# Patient Record
Sex: Female | Born: 1960 | Race: White | Hispanic: No | Marital: Married | Smoking: Current every day smoker
Health system: Southern US, Community
[De-identification: ages and names within clinical notes are randomized; demographics above are authoritative.]

## PROBLEM LIST (undated history)

## (undated) DIAGNOSIS — I639 Cerebral infarction, unspecified: Secondary | ICD-10-CM

## (undated) DIAGNOSIS — M1990 Unspecified osteoarthritis, unspecified site: Secondary | ICD-10-CM

## (undated) DIAGNOSIS — M549 Dorsalgia, unspecified: Secondary | ICD-10-CM

## (undated) DIAGNOSIS — E785 Hyperlipidemia, unspecified: Secondary | ICD-10-CM

## (undated) DIAGNOSIS — F1010 Alcohol abuse, uncomplicated: Secondary | ICD-10-CM

## (undated) DIAGNOSIS — F329 Major depressive disorder, single episode, unspecified: Secondary | ICD-10-CM

## (undated) DIAGNOSIS — F419 Anxiety disorder, unspecified: Secondary | ICD-10-CM

## (undated) DIAGNOSIS — I10 Essential (primary) hypertension: Secondary | ICD-10-CM

## (undated) DIAGNOSIS — F32A Depression, unspecified: Secondary | ICD-10-CM

## (undated) DIAGNOSIS — K219 Gastro-esophageal reflux disease without esophagitis: Secondary | ICD-10-CM

## (undated) DIAGNOSIS — Z720 Tobacco use: Secondary | ICD-10-CM

## (undated) DIAGNOSIS — G8929 Other chronic pain: Secondary | ICD-10-CM

## (undated) HISTORY — DX: Major depressive disorder, single episode, unspecified: F32.9

## (undated) HISTORY — PX: APPENDECTOMY: SHX54

## (undated) HISTORY — DX: Anxiety disorder, unspecified: F41.9

## (undated) HISTORY — DX: Gastro-esophageal reflux disease without esophagitis: K21.9

## (undated) HISTORY — DX: Essential (primary) hypertension: I10

## (undated) HISTORY — DX: Unspecified osteoarthritis, unspecified site: M19.90

## (undated) HISTORY — DX: Depression, unspecified: F32.A

---

## 2016-04-05 ENCOUNTER — Ambulatory Visit: Payer: Self-pay

## 2016-04-07 ENCOUNTER — Ambulatory Visit: Payer: Self-pay | Attending: Internal Medicine

## 2016-05-06 ENCOUNTER — Ambulatory Visit: Payer: Self-pay

## 2016-10-14 ENCOUNTER — Encounter (HOSPITAL_COMMUNITY): Payer: Self-pay

## 2016-10-14 ENCOUNTER — Emergency Department (HOSPITAL_COMMUNITY)
Admission: EM | Admit: 2016-10-14 | Discharge: 2016-10-14 | Disposition: A | Discharge status: Home / Self Care | Payer: Self-pay | Attending: Emergency Medicine | Admitting: Emergency Medicine

## 2016-10-14 DIAGNOSIS — M545 Low back pain: Secondary | ICD-10-CM | POA: Insufficient documentation

## 2016-10-14 DIAGNOSIS — Z720 Tobacco use: Secondary | ICD-10-CM

## 2016-10-14 DIAGNOSIS — F17210 Nicotine dependence, cigarettes, uncomplicated: Secondary | ICD-10-CM | POA: Insufficient documentation

## 2016-10-14 DIAGNOSIS — R030 Elevated blood-pressure reading, without diagnosis of hypertension: Secondary | ICD-10-CM | POA: Insufficient documentation

## 2016-10-14 HISTORY — DX: Other chronic pain: G89.29

## 2016-10-14 HISTORY — DX: Dorsalgia, unspecified: M54.9

## 2016-10-14 MED ORDER — OXYCODONE-ACETAMINOPHEN 5-325 MG PO TABS
ORAL_TABLET | ORAL | Status: AC
  Filled 2016-10-14: qty 1

## 2016-10-14 MED ORDER — METHOCARBAMOL 500 MG PO TABS: ORAL_TABLET | ORAL | 0 refills | Status: DC

## 2016-10-14 MED ORDER — PREDNISONE 50 MG PO TABS: ORAL_TABLET | ORAL | 0 refills | Status: DC

## 2016-10-14 MED ORDER — LIDOCAINE 5 % EX PTCH
1.0000 | MEDICATED_PATCH | Freq: Once | CUTANEOUS | Status: DC
  Administered 2016-10-14: 1 via TRANSDERMAL
  Filled 2016-10-14: qty 1

## 2016-10-14 MED ORDER — METHOCARBAMOL 500 MG PO TABS
1000.0000 mg | ORAL_TABLET | Freq: Once | ORAL | Status: AC
Start: 2016-10-14 — End: 2016-10-14
  Administered 2016-10-14: 1000 mg via ORAL
  Filled 2016-10-14: qty 2

## 2016-10-14 MED ORDER — OXYCODONE-ACETAMINOPHEN 5-325 MG PO TABS
1.0000 | ORAL_TABLET | ORAL | Status: DC | PRN
  Administered 2016-10-14: 1 via ORAL
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
## 2016-10-14 NOTE — Discharge Instructions (Signed)
These follow at your primary care appointment which was made for you on February 23. If you have recurrent chest pain or if your hand becomes unusable please return to the emergency department for immediate evaluation.  For pain control you may take up to 800mg  of Motrin (also known as ibuprofen). That is usually 4 over the counter pills,  3 times a day. Take with food to minimize stomach irritation   You can also take  tylenol (acetaminophen) 975mg  (this is 3 over the counter pills) four times a day. Do not drink alcohol or combine with other medications that have acetaminophen as an ingredient (Read the labels!).    For breakthrough pain you may take Robaxin. Do not drink alcohol, drive or operate heavy machinery when taking Robaxin.

## 2016-10-14 NOTE — Discharge Planning (Signed)
Charnese Federici J. Clydene Laming, RN, BSN, [facility] set up appointment with Cammie Sickle, NP on 2/23 @0900 .  Spoke with pt at bedside and advised to please arrive 15 min early and take a picture ID and your current medications.  Pt verbalizes understanding of keeping appointment.  EDCM gave pt  Sylvie Farrier, [employer] Specialist contact information regarding [facility] orange card process.   No further CM needs identified at this time.

## 2016-10-14 NOTE — ED Notes (Signed)
ED Provider at bedside. 

## 2016-10-14 NOTE — ED Notes (Signed)
Declined W/C at D/C and was escorted to lobby by RN. 

## 2016-10-16 ENCOUNTER — Encounter (HOSPITAL_COMMUNITY): Payer: Self-pay

## 2016-10-16 ENCOUNTER — Emergency Department (HOSPITAL_COMMUNITY): Payer: Self-pay

## 2016-10-16 ENCOUNTER — Emergency Department (HOSPITAL_COMMUNITY)
Admission: EM | Admit: 2016-10-16 | Discharge: 2016-10-16 | Disposition: A | Discharge status: Home / Self Care | Payer: Self-pay | Attending: Emergency Medicine | Admitting: Emergency Medicine

## 2016-10-16 DIAGNOSIS — F17210 Nicotine dependence, cigarettes, uncomplicated: Secondary | ICD-10-CM | POA: Insufficient documentation

## 2016-10-16 DIAGNOSIS — Y939 Activity, unspecified: Secondary | ICD-10-CM | POA: Insufficient documentation

## 2016-10-16 DIAGNOSIS — Z79899 Other long term (current) drug therapy: Secondary | ICD-10-CM | POA: Insufficient documentation

## 2016-10-16 DIAGNOSIS — Y999 Unspecified external cause status: Secondary | ICD-10-CM | POA: Insufficient documentation

## 2016-10-16 DIAGNOSIS — W0110XA Fall on same level from slipping, tripping and stumbling with subsequent striking against unspecified object, initial encounter: Secondary | ICD-10-CM | POA: Insufficient documentation

## 2016-10-16 DIAGNOSIS — Y929 Unspecified place or not applicable: Secondary | ICD-10-CM | POA: Insufficient documentation

## 2016-10-16 DIAGNOSIS — S300XXA Contusion of lower back and pelvis, initial encounter: Secondary | ICD-10-CM | POA: Insufficient documentation

## 2016-10-16 MED ORDER — HYDROCODONE-ACETAMINOPHEN 5-325 MG PO TABS
1.0000 | ORAL_TABLET | Freq: Once | ORAL | Status: AC
  Administered 2016-10-16: 1 via ORAL
  Filled 2016-10-16: qty 1

## 2016-10-16 MED ORDER — CYCLOBENZAPRINE HCL 10 MG PO TABS: 10.0000 mg | ORAL_TABLET | Freq: Two times a day (BID) | ORAL | 0 refills | Status: DC | PRN

## 2016-10-16 MED ORDER — IBUPROFEN 200 MG PO TABS
600.0000 mg | ORAL_TABLET | Freq: Once | ORAL | Status: AC
  Administered 2016-10-16: 600 mg via ORAL
  Filled 2016-10-16: qty 3

## 2016-10-16 MED ORDER — NAPROXEN 500 MG PO TABS: 500.0000 mg | ORAL_TABLET | Freq: Two times a day (BID) | ORAL | 0 refills | Status: DC

## 2016-10-16 NOTE — Discharge Instructions (Signed)
Naprosyn for pain and inflammation. Flexeril for spasms. Follow up with urgent care of family doctor if not improving.

## 2016-10-16 NOTE — ED Provider Notes (Signed)
[facility] DEPT Provider Note   CSN: [id number] Arrival date & time: 10/16/16  1543     History   Chief Complaint Chief Complaint  Patient presents with  . Fall  . Tailbone Pain    HPI Kelsey Wade is a 56 y.o. female.  HPI Kelsey Wade is a 56 y.o. female with history of back pain, presents to emergency department after a fall. Patient states she was in the shower this morning, states she slipped and fell and hit her lower back/tailbone on the side of the bathtub. Denies any other injuries. No head injury. No loss of consciousness. Reports persistent lower back and sacral pain since the fall. Denies pain radiating down her legs. No trouble urinating or loss of bowel control. Denies any fever or chills. She has not tried taking any medications for her pain at home. She states pain is worsened with movement, sitting, walking. Nothing is making it better. No history of tailbone injuries in the past.  Past Medical History:  Diagnosis Date  . Chronic back pain     There are no active problems to display for this patient.   History reviewed. No pertinent surgical history.  OB History    No data available       Home Medications    Prior to Admission medications   Medication Sig Start Date End Date Taking? Authorizing Provider  methocarbamol (ROBAXIN) 500 MG tablet Can take up to 1-2 tabs every 6 hours PRN PAIN 10/14/16   Elmyra Ricks Pisciotta, PA-C  predniSONE (DELTASONE) 50 MG tablet Take 1 tablet daily with breakfast 10/14/16   Monico Blitz, PA-C    Family History History reviewed. No pertinent family history.  Social History Social History  Substance Use Topics  . Smoking status: Current Every Day Smoker    Packs/day: 0.50    Types: Cigarettes  . Smokeless tobacco: Never Used  . Alcohol use Yes     Comment: Occasionally      Allergies   Patient has no known allergies.   Review of Systems Review of Systems  Constitutional: Negative for chills  and fever.  Respiratory: Negative for cough, chest tightness and shortness of breath.   Cardiovascular: Negative for chest pain, palpitations and leg swelling.  Gastrointestinal: Negative for abdominal pain, diarrhea, nausea and vomiting.  Genitourinary: Negative for dysuria, flank pain and pelvic pain.  Musculoskeletal: Positive for arthralgias and back pain. Negative for myalgias, neck pain and neck stiffness.  Skin: Negative for rash.  Neurological: Negative for dizziness, weakness, numbness and headaches.  All other systems reviewed and are negative.    Physical Exam Updated Vital Signs BP 141/86 (BP Location: Right Arm)   Pulse 93   Temp 98.2 F (36.8 C) (Oral)   Resp 18   Ht 5\' 5"  (1.651 m)   Wt 62.6 kg   SpO2 98%   BMI 22.96 kg/m   Physical Exam  Constitutional: She is oriented to person, place, and time. She appears well-developed and well-nourished. No distress.  HENT:  Head: Normocephalic.  Eyes: Conjunctivae are normal.  Neck: Neck supple.  Cardiovascular: Normal rate, regular rhythm and normal heart sounds.   Pulmonary/Chest: Effort normal and breath sounds normal. No respiratory distress. She has no wheezes. She has no rales.  Abdominal: Soft. Bowel sounds are normal. She exhibits no distension. There is no tenderness. There is no rebound.  Musculoskeletal: She exhibits no edema.  Tenderness to palpation over the lower sacrum midline and over bilateral SI joints. Tenderness to the  coccyx. No bruising, swelling erythema. Full range of motion of bilateral hips and knees. No pain with bilateral straight leg raise.  Neurological: She is alert and oriented to person, place, and time.  5/5 and equal lower extremity strength. 2+ and equal patellar reflexes bilaterally. Pt able to dorsiflex bilateral toes and feet with good strength against resistance. Equal sensation bilaterally over thighs and lower legs.   Skin: Skin is warm and dry.  Psychiatric: She has a normal mood  and affect. Her behavior is normal.  Nursing note and vitals reviewed.    ED Treatments / Results  Labs (all labs ordered are listed, but only abnormal results are displayed) Labs Reviewed - No data to display  EKG  EKG Interpretation None       Radiology Dg Sacrum/coccyx  Result Date: 10/16/2016 CLINICAL DATA:  Recent fall with coccygeal pain, initial encounter EXAM: SACRUM AND COCCYX - 2+ VIEW COMPARISON:  None. FINDINGS: Degenerative changes at the lumbosacral junction are noted. No acute fracture or dislocation is seen. No soft tissue abnormality is noted. IMPRESSION: No acute abnormality noted. Electronically Signed   By: Inez Catalina M.D.   On: 10/16/2016 18:59    Procedures Procedures (including critical care time)  Medications Ordered in ED Medications  HYDROcodone-acetaminophen (NORCO/VICODIN) 5-325 MG per tablet 1 tablet (not administered)  ibuprofen (ADVIL,MOTRIN) tablet 600 mg (not administered)     Initial Impression / Assessment and Plan / ED Course  I have reviewed the triage vital signs and the nursing notes.  Pertinent labs & imaging results that were available during my care of the patient were reviewed by me and considered in my medical decision making (see chart for details).    Patient in emergency department after a fall and hitting her lower sacrum and tailbone on bathtub. Patient with tenderness over the bony structures of the sacrum and coccyx. She is neurovascularly intact. We'll get films.   Patient received Norco for pain in emergency department. X-rays negative for fracture. Most likely contusion. Stable for discharge home, will start on muscle relaxants, NSAIDs, advised to get a doughnut pillow for comfort, follow up with family doctor. Return precautions discussed  Vitals:   10/16/16 1618 10/16/16 1949  BP: 141/86 154/99  Pulse: 93 74  Resp: 18 16  Temp: 98.2 F (36.8 C)   TempSrc: Oral   SpO2: 98% 100%  Weight: 62.6 kg   Height: 5'  5" (1.651 m)      Final Clinical Impressions(s) / ED Diagnoses   Final diagnoses:  Contusion of coccyx, initial encounter    New Prescriptions Discharge Medication List as of 10/16/2016  7:27 PM    START taking these medications   Details  cyclobenzaprine (FLEXERIL) 10 MG tablet Take 1 tablet (10 mg total) by mouth 2 (two) times daily as needed for muscle spasms., Starting Sat 10/16/2016, Print    naproxen (NAPROSYN) 500 MG tablet Take 1 tablet (500 mg total) by mouth 2 (two) times daily., Starting Sat 10/16/2016, Print         Jeannett Senior, PA-C 10/16/16 2320    Julianne Rice, MD 10/17/16 [place]

## 2016-10-16 NOTE — ED Notes (Signed)
Patient transported to X-ray 

## 2016-10-16 NOTE — ED Triage Notes (Signed)
Pt c/o tailbone pain after a slip and fall this afternoon.  Pain score 9/10.  Pt has not taken anything for pain.  Hx of chronic back pain.

## 2016-11-12 ENCOUNTER — Encounter: Payer: Self-pay | Admitting: Family Medicine

## 2016-11-12 ENCOUNTER — Ambulatory Visit (INDEPENDENT_AMBULATORY_CARE_PROVIDER_SITE_OTHER): Payer: Self-pay | Admitting: Family Medicine

## 2016-11-12 VITALS — BP 152/92 | HR 89 | Temp 98.2°F | Resp 18 | Ht 64.0 in | Wt 140.0 lb

## 2016-11-12 DIAGNOSIS — G629 Polyneuropathy, unspecified: Secondary | ICD-10-CM

## 2016-11-12 DIAGNOSIS — M5440 Lumbago with sciatica, unspecified side: Secondary | ICD-10-CM

## 2016-11-12 DIAGNOSIS — K219 Gastro-esophageal reflux disease without esophagitis: Secondary | ICD-10-CM

## 2016-11-12 DIAGNOSIS — F17200 Nicotine dependence, unspecified, uncomplicated: Secondary | ICD-10-CM

## 2016-11-12 DIAGNOSIS — M5441 Lumbago with sciatica, right side: Secondary | ICD-10-CM

## 2016-11-12 DIAGNOSIS — I10 Essential (primary) hypertension: Secondary | ICD-10-CM

## 2016-11-12 DIAGNOSIS — G8929 Other chronic pain: Secondary | ICD-10-CM

## 2016-11-12 DIAGNOSIS — Z23 Encounter for immunization: Secondary | ICD-10-CM

## 2016-11-12 LAB — COMPLETE METABOLIC PANEL WITH GFR
ALBUMIN: 4.5 g/dL (ref 3.6–5.1)
ALK PHOS: 82 U/L (ref 33–130)
ALT: 9 U/L (ref 6–29)
AST: 17 U/L (ref 10–35)
BUN: 16 mg/dL (ref 7–25)
CALCIUM: 9.9 mg/dL (ref 8.6–10.4)
CO2: 27 mmol/L (ref 20–31)
Chloride: 104 mmol/L (ref 98–110)
Creat: 0.83 mg/dL (ref 0.50–1.05)
GFR, EST NON AFRICAN AMERICAN: 80 mL/min (ref >=60)
Glucose, Bld: 100 mg/dL — ABNORMAL HIGH (ref 65–99)
POTASSIUM: 5 mmol/L (ref 3.5–5.3)
SODIUM: 138 mmol/L (ref 135–146)
Total Bilirubin: 0.5 mg/dL (ref 0.2–1.2)
Total Protein: 7.8 g/dL (ref 6.1–8.1)

## 2016-11-12 LAB — CBC WITH DIFFERENTIAL/PLATELET
Basophils Absolute: 0 cells/uL (ref 0–200)
Basophils Relative: 0 %
EOS ABS: 160 {cells}/uL (ref 15–500)
Eosinophils Relative: 2 %
HEMATOCRIT: 49.7 % — AB (ref 35.0–45.0)
HEMOGLOBIN: 17.2 g/dL — AB (ref 11.7–15.5)
LYMPHS PCT: 37 %
Lymphs Abs: 2960 cells/uL (ref 850–3900)
MCH: 31.3 pg (ref 27.0–33.0)
MCHC: 34.6 g/dL (ref 32.0–36.0)
MCV: 90.5 fL (ref 80.0–100.0)
MPV: 9.9 fL (ref 7.5–12.5)
Monocytes Absolute: 480 cells/uL (ref 200–950)
Monocytes Relative: 6 %
NEUTROS PCT: 55 %
Neutro Abs: 4400 cells/uL (ref 1500–7800)
Platelets: 229 10*3/uL (ref 140–400)
RBC: 5.49 MIL/uL — ABNORMAL HIGH (ref 3.80–5.10)
RDW: 13.2 % (ref 11.0–15.0)
WBC: 8 10*3/uL (ref 3.8–10.8)

## 2016-11-12 LAB — POCT URINALYSIS DIP (DEVICE)
BILIRUBIN URINE: NEGATIVE
Glucose, UA: NEGATIVE mg/dL
HGB URINE DIPSTICK: NEGATIVE
Ketones, ur: NEGATIVE mg/dL
Leukocytes, UA: NEGATIVE
NITRITE: NEGATIVE
PH: 7 (ref 5.0–8.0)
Protein, ur: NEGATIVE mg/dL
SPECIFIC GRAVITY, URINE: 1.015 (ref 1.005–1.030)
UROBILINOGEN UA: 0.2 mg/dL (ref 0.0–1.0)

## 2016-11-12 LAB — LIPID PANEL
CHOL/HDL RATIO: 3.7 ratio (ref <5.0)
CHOLESTEROL: 215 mg/dL — AB (ref <200)
HDL: 58 mg/dL (ref >50)
LDL Cholesterol: 126 mg/dL — ABNORMAL HIGH (ref <100)
TRIGLYCERIDES: 155 mg/dL — AB (ref <150)
VLDL: 31 mg/dL — ABNORMAL HIGH (ref <30)

## 2016-11-12 MED ORDER — KETOROLAC TROMETHAMINE 60 MG/2ML IM SOLN
60.0000 mg | Freq: Once | INTRAMUSCULAR | Status: AC
  Administered 2016-11-12: 60 mg via INTRAMUSCULAR

## 2016-11-12 MED ORDER — AMLODIPINE BESYLATE 5 MG PO TABS: 5.0000 mg | ORAL_TABLET | Freq: Every day | ORAL | 5 refills | Status: DC

## 2016-11-12 MED ORDER — CLONIDINE HCL 0.2 MG PO TABS
0.2000 mg | ORAL_TABLET | Freq: Once | ORAL | Status: AC
  Administered 2016-11-12: 0.2 mg via ORAL

## 2016-11-12 MED ORDER — GABAPENTIN 300 MG PO CAPS: 300.0000 mg | ORAL_CAPSULE | Freq: Three times a day (TID) | ORAL | 0 refills | Status: DC

## 2016-11-12 MED ORDER — OMEPRAZOLE 20 MG PO CPDR: 20.0000 mg | DELAYED_RELEASE_CAPSULE | Freq: Every day | ORAL | 3 refills | Status: DC
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
## 2016-11-12 NOTE — Patient Instructions (Addendum)
Elevated blood pressure, will start Amlodipine 5 mg daily with breakfast. Follow up in 1 month for hypertension. Recommend a low fat, low sodium diet.  Nerve Pain- Will start a trial of Gabapentin 300 mg three times per day. Do not drink alcohol, drive, or operate machinery while taking medication.   Will start Omeprazole for symptoms of GERD  Will notify by phone with laboratory results.  Back Pain, Adult Back pain is very common in adults.The cause of back pain is rarely dangerous and the pain often gets better over time.The cause of your back pain may not be known. Some common causes of back pain include:  Strain of the muscles or ligaments supporting the spine.  Wear and tear (degeneration) of the spinal disks.  Arthritis.  Direct injury to the back. For many people, back pain may return. Since back pain is rarely dangerous, most people can learn to manage this condition on their own. Follow these instructions at home: Watch your back pain for any changes. The following actions may help to lessen any discomfort you are feeling:  Remain active. It is stressful on your back to sit or stand in one place for long periods of time. Do not sit, drive, or stand in one place for more than 30 minutes at a time. Take short walks on even surfaces as soon as you are able.Try to increase the length of time you walk each day.  Exercise regularly as directed by your health care provider. Exercise helps your back heal faster. It also helps avoid future injury by keeping your muscles strong and flexible.  Do not stay in bed.Resting more than 1-2 days can delay your recovery.  Pay attention to your body when you bend and lift. The most comfortable positions are those that put less stress on your recovering back. Always use proper lifting techniques, including:  Bending your knees.  Keeping the load close to your body.  Avoiding twisting.  Find a comfortable position to sleep. Use a firm  mattress and lie on your side with your knees slightly bent. If you lie on your back, put a pillow under your knees.  Avoid feeling anxious or stressed.Stress increases muscle tension and can worsen back pain.It is important to recognize when you are anxious or stressed and learn ways to manage it, such as with exercise.  Take medicines only as directed by your health care provider. Over-the-counter medicines to reduce pain and inflammation are often the most helpful.Your health care provider may prescribe muscle relaxant drugs.These medicines help dull your pain so you can more quickly return to your normal activities and healthy exercise.  Apply ice to the injured area:  Put ice in a plastic bag.  Place a towel between your skin and the bag.  Leave the ice on for 20 minutes, 2-3 times a day for the first 2-3 days. After that, ice and heat may be alternated to reduce pain and spasms.  Maintain a healthy weight. Excess weight puts extra stress on your back and makes it difficult to maintain good posture. Contact a health care provider if:  You have pain that is not relieved with rest or medicine.  You have increasing pain going down into the legs or buttocks.  You have pain that does not improve in one week.  You have night pain.  You lose weight.  You have a fever or chills. Get help right away if:  You develop new bowel or bladder control problems.  You have  unusual weakness or numbness in your arms or legs.  You develop nausea or vomiting.  You develop abdominal pain.  You feel faint. This information is not intended to replace advice given to you by your health care provider. Make sure you discuss any questions you have with your health care provider. Document Released: 09/06/2005 Document Revised: 01/15/2016 Document Reviewed: 01/08/2014 Elsevier Interactive Patient Education  2017 [place] for Gastroesophageal Reflux Disease, Adult When you  have gastroesophageal reflux disease (GERD), the foods you eat and your eating habits are very important. Choosing the right foods can help ease your discomfort. What guidelines do I need to follow?  Choose fruits, vegetables, whole grains, and low-fat dairy products.  Choose low-fat meat, fish, and poultry.  Limit fats such as oils, salad dressings, butter, nuts, and avocado.  Keep a food diary. This helps you identify foods that cause symptoms.  Avoid foods that cause symptoms. These may be different for everyone.  Eat small meals often instead of 3 large meals a day.  Eat your meals slowly, in a place where you are relaxed.  Limit fried foods.  Cook foods using methods other than frying.  Avoid drinking alcohol.  Avoid drinking large amounts of liquids with your meals.  Avoid bending over or lying down until 2-3 hours after eating. What foods are not recommended? These are some foods and drinks that may make your symptoms worse: Vegetables  Tomatoes. Tomato juice. Tomato and spaghetti sauce. Chili peppers. Onion and garlic. Horseradish. Fruits  Oranges, grapefruit, and lemon (fruit and juice). Meats  High-fat meats, fish, and poultry. This includes hot dogs, ribs, ham, sausage, salami, and bacon. Dairy  Whole milk and chocolate milk. Sour cream. Cream. Butter. Ice cream. Cream cheese. Drinks  Coffee and tea. Bubbly (carbonated) drinks or energy drinks. Condiments  Hot sauce. Barbecue sauce. Sweets/Desserts  Chocolate and cocoa. Donuts. Peppermint and spearmint. Fats and Oils  High-fat foods. This includes [place] fries and potato chips. Other  Vinegar. Strong spices. This includes black pepper, white pepper, red pepper, cayenne, curry powder, cloves, ginger, and chili powder. The items listed above may not be a complete list of foods and drinks to avoid. Contact your dietitian for more information.  This information is not intended to replace advice given to you by  your health care provider. Make sure you discuss any questions you have with your health care provider. Document Released: 03/07/2012 Document Revised: 02/12/2016 Document Reviewed: 07/11/2013 Elsevier Interactive Patient Education  2017 [employer].  Back Exercises Introduction If you have pain in your back, do these exercises 2-3 times each day or as told by your doctor. When the pain goes away, do the exercises once each day, but repeat the steps more times for each exercise (do more repetitions). If you do not have pain in your back, do these exercises once each day or as told by your doctor. Exercises Single Knee to Chest  Do these steps 3-5 times in a row for each leg: 1. Lie on your back on a firm bed or the floor with your legs stretched out. 2. Bring one knee to your chest. 3. Hold your knee to your chest by grabbing your knee or thigh. 4. Pull on your knee until you feel a gentle stretch in your lower back. 5. Keep doing the stretch for 10-30 seconds. 6. Slowly let go of your leg and straighten it. Pelvic Tilt  Do these steps 5-10 times in a row: 1. Kelsey Wade  on your back on a firm bed or the floor with your legs stretched out. 2. Bend your knees so they point up to the ceiling. Your feet should be flat on the floor. 3. Tighten your lower belly (abdomen) muscles to press your lower back against the floor. This will make your tailbone point up to the ceiling instead of pointing down to your feet or the floor. 4. Stay in this position for 5-10 seconds while you gently tighten your muscles and breathe evenly. Cat-Cow  Do these steps until your lower back bends more easily: 1. Get on your hands and knees on a firm surface. Keep your hands under your shoulders, and keep your knees under your hips. You may put padding under your knees. 2. Let your head hang down, and make your tailbone point down to the floor so your lower back is round like the back of a cat. 3. Stay in this position for 5  seconds. 4. Slowly lift your head and make your tailbone point up to the ceiling so your back hangs low (sags) like the back of a cow. 5. Stay in this position for 5 seconds. Press-Ups  Do these steps 5-10 times in a row: 1. Lie on your belly (face-down) on the floor. 2. Place your hands near your head, about shoulder-width apart. 3. While you keep your back relaxed and keep your hips on the floor, slowly straighten your arms to raise the top half of your body and lift your shoulders. Do not use your back muscles. To make yourself more comfortable, you may change where you place your hands. 4. Stay in this position for 5 seconds. 5. Slowly return to lying flat on the floor. Bridges  Do these steps 10 times in a row: 1. Lie on your back on a firm surface. 2. Bend your knees so they point up to the ceiling. Your feet should be flat on the floor. 3. Tighten your butt muscles and lift your butt off of the floor until your waist is almost as high as your knees. If you do not feel the muscles working in your butt and the back of your thighs, slide your feet 1-2 inches farther away from your butt. 4. Stay in this position for 3-5 seconds. 5. Slowly lower your butt to the floor, and let your butt muscles relax. If this exercise is too easy, try doing it with your arms crossed over your chest. Belly Crunches  Do these steps 5-10 times in a row: 1. Lie on your back on a firm bed or the floor with your legs stretched out. 2. Bend your knees so they point up to the ceiling. Your feet should be flat on the floor. 3. Cross your arms over your chest. 4. Tip your chin a little bit toward your chest but do not bend your neck. 5. Tighten your belly muscles and slowly raise your chest just enough to lift your shoulder blades a tiny bit off of the floor. 6. Slowly lower your chest and your head to the floor. Back Lifts  Do these steps 5-10 times in a row: 1. Lie on your belly (face-down) with your arms at  your sides, and rest your forehead on the floor. 2. Tighten the muscles in your legs and your butt. 3. Slowly lift your chest off of the floor while you keep your hips on the floor. Keep the back of your head in line with the curve in your back. Look at the  floor while you do this. 4. Stay in this position for 3-5 seconds. 5. Slowly lower your chest and your face to the floor. Contact a doctor if:  Your back pain gets a lot worse when you do an exercise.  Your back pain does not lessen 2 hours after you exercise. If you have any of these problems, stop doing the exercises. Do not do them again unless your doctor says it is okay. Get help right away if:  You have sudden, very bad back pain. If this happens, stop doing the exercises. Do not do them again unless your doctor says it is okay. This information is not intended to replace advice given to you by your health care provider. Make sure you discuss any questions you have with your health care provider. Document Released: 10/09/2010 Document Revised: 02/12/2016 Document Reviewed: 10/31/2014  2017 Elsevier DASH Eating Plan DASH stands for "Dietary Approaches to Stop Hypertension." The DASH eating plan is a healthy eating plan that has been shown to reduce high blood pressure (hypertension). Additional health benefits may include reducing the risk of type 2 diabetes mellitus, heart disease, and stroke. The DASH eating plan may also help with weight loss. What do I need to know about the DASH eating plan? For the DASH eating plan, you will follow these general guidelines:  Choose foods with less than 150 milligrams of sodium per serving (as listed on the food label).  Use salt-free seasonings or herbs instead of table salt or sea salt.  Check with your health care provider or pharmacist before using salt substitutes.  Eat lower-sodium products. These are often labeled as "low-sodium" or "no salt added."  Eat fresh foods. Avoid eating a  lot of canned foods.  Eat more vegetables, fruits, and low-fat dairy products.  Choose whole grains. Look for the word "whole" as the first word in the ingredient list.  Choose fish and skinless chicken or [place] more often than red meat. Limit fish, poultry, and meat to 6 oz (170 g) each day.  Limit sweets, desserts, sugars, and sugary drinks.  Choose heart-healthy fats.  Eat more home-cooked food and less restaurant, buffet, and fast food.  Limit fried foods.  Do not fry foods. Cook foods using methods such as baking, boiling, grilling, and broiling instead.  When eating at a restaurant, ask that your food be prepared with less salt, or no salt if possible. What foods can I eat? Seek help from a dietitian for individual calorie needs. Grains  Whole grain or whole wheat bread. Brown rice. Whole grain or whole wheat pasta. Quinoa, bulgur, and whole grain cereals. Low-sodium cereals. Corn or whole wheat flour tortillas. Whole grain cornbread. Whole grain crackers. Low-sodium crackers. Vegetables  Fresh or frozen vegetables (raw, steamed, roasted, or grilled). Low-sodium or reduced-sodium tomato and vegetable juices. Low-sodium or reduced-sodium tomato sauce and paste. Low-sodium or reduced-sodium canned vegetables. Fruits  All fresh, canned (in natural juice), or frozen fruits. Meat and Other Protein Products  Ground beef (85% or leaner), grass-fed beef, or beef trimmed of fat. Skinless chicken or [place]. Ground chicken or [place]. Pork trimmed of fat. All fish and seafood. Eggs. Dried beans, peas, or lentils. Unsalted nuts and seeds. Unsalted canned beans. Dairy  Low-fat dairy products, such as skim or 1% milk, 2% or reduced-fat cheeses, low-fat ricotta or cottage cheese, or plain low-fat yogurt. Low-sodium or reduced-sodium cheeses. Fats and Oils  Tub margarines without trans fats. Light or reduced-fat mayonnaise and salad dressings (reduced sodium).  Avocado. Safflower, olive, or  canola oils. Natural peanut or almond butter. Other  Unsalted popcorn and pretzels. The items listed above may not be a complete list of recommended foods or beverages. Contact your dietitian for more options.  What foods are not recommended? Grains  White bread. White pasta. White rice. Refined cornbread. Bagels and croissants. Crackers that contain trans fat. Vegetables  Creamed or fried vegetables. Vegetables in a cheese sauce. Regular canned vegetables. Regular canned tomato sauce and paste. Regular tomato and vegetable juices. Fruits  Canned fruit in light or heavy syrup. Fruit juice. Meat and Other Protein Products  Fatty cuts of meat. Ribs, chicken wings, bacon, sausage, bologna, salami, chitterlings, fatback, hot dogs, bratwurst, and packaged luncheon meats. Salted nuts and seeds. Canned beans with salt. Dairy  Whole or 2% milk, cream, half-and-half, and cream cheese. Whole-fat or sweetened yogurt. Full-fat cheeses or blue cheese. Nondairy creamers and whipped toppings. Processed cheese, cheese spreads, or cheese curds. Condiments  Onion and garlic salt, seasoned salt, table salt, and sea salt. Canned and packaged gravies. Worcestershire sauce. Tartar sauce. Barbecue sauce. Teriyaki sauce. Soy sauce, including reduced sodium. Steak sauce. Fish sauce. Oyster sauce. Cocktail sauce. Horseradish. Ketchup and mustard. Meat flavorings and tenderizers. Bouillon cubes. Hot sauce. Tabasco sauce. Marinades. Taco seasonings. Relishes. Fats and Oils  Butter, stick margarine, lard, shortening, ghee, and bacon fat. Coconut, palm kernel, or palm oils. Regular salad dressings. Other  Pickles and olives. Salted popcorn and pretzels. The items listed above may not be a complete list of foods and beverages to avoid. Contact your dietitian for more information.  Where can I find more information? National Heart, Lung, and Blood Institute: travelstabloid.com This  information is not intended to replace advice given to you by your health care provider. Make sure you discuss any questions you have with your health care provider. Document Released: 08/26/2011 Document Revised: 02/12/2016 Document Reviewed: 07/11/2013 Elsevier Interactive Patient Education  2017 [employer].

## 2016-11-15 ENCOUNTER — Other Ambulatory Visit: Payer: Self-pay | Admitting: Family Medicine

## 2016-11-15 DIAGNOSIS — E785 Hyperlipidemia, unspecified: Secondary | ICD-10-CM | POA: Insufficient documentation

## 2016-11-15 MED ORDER — ASPIRIN EC 81 MG PO TBEC: 81.0000 mg | DELAYED_RELEASE_TABLET | Freq: Every day | ORAL | 11 refills | Status: DC

## 2016-11-15 MED ORDER — ATORVASTATIN CALCIUM 20 MG PO TABS: 20.0000 mg | ORAL_TABLET | Freq: Every day | ORAL | 1 refills | Status: DC

## 2016-11-15 NOTE — Progress Notes (Signed)
Meds ordered this encounter  Medications  . atorvastatin (LIPITOR) 20 MG tablet    Sig: Take 1 tablet (20 mg total) by mouth daily.    Dispense:  90 tablet    Refill:  1  . aspirin EC 81 MG tablet    Sig: Take 1 tablet (81 mg total) by mouth daily.    Dispense:  30 tablet    Refill:  11

## 2016-11-15 NOTE — Progress Notes (Signed)
Called, no answer. Left message for patient to return call. Thanks!  

## 2016-11-16 NOTE — Progress Notes (Signed)
Called, no answer. Left message for patient to return call. Thanks!  

## 2016-11-17 NOTE — Progress Notes (Signed)
Called, no answer. Will mail letter due to unable to contact patient via phone. Thanks!

## 2016-11-29 ENCOUNTER — Ambulatory Visit (INDEPENDENT_AMBULATORY_CARE_PROVIDER_SITE_OTHER): Payer: Self-pay | Admitting: Family Medicine

## 2016-11-29 ENCOUNTER — Encounter: Payer: Self-pay | Admitting: Family Medicine

## 2016-11-29 VITALS — BP 154/80 | HR 81 | Temp 97.7°F | Resp 18 | Ht 64.0 in | Wt 138.8 lb

## 2016-11-29 DIAGNOSIS — I10 Essential (primary) hypertension: Secondary | ICD-10-CM

## 2016-11-29 DIAGNOSIS — G8929 Other chronic pain: Secondary | ICD-10-CM

## 2016-11-29 DIAGNOSIS — E785 Hyperlipidemia, unspecified: Secondary | ICD-10-CM

## 2016-11-29 DIAGNOSIS — F339 Major depressive disorder, recurrent, unspecified: Secondary | ICD-10-CM

## 2016-11-29 DIAGNOSIS — M549 Dorsalgia, unspecified: Secondary | ICD-10-CM

## 2016-11-29 DIAGNOSIS — G629 Polyneuropathy, unspecified: Secondary | ICD-10-CM

## 2016-11-29 DIAGNOSIS — F17200 Nicotine dependence, unspecified, uncomplicated: Secondary | ICD-10-CM

## 2016-11-29 MED ORDER — DULOXETINE HCL 30 MG PO CPEP: 30.0000 mg | ORAL_CAPSULE | Freq: Every day | ORAL | 5 refills | Status: DC

## 2016-11-29 MED ORDER — AMLODIPINE BESYLATE 10 MG PO TABS: 10.0000 mg | ORAL_TABLET | Freq: Every day | ORAL | 5 refills | Status: DC

## 2016-11-29 MED ORDER — KETOROLAC TROMETHAMINE 60 MG/2ML IM SOLN
60.0000 mg | Freq: Once | INTRAMUSCULAR | Status: AC
  Administered 2016-11-29: 60 mg via INTRAMUSCULAR
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
## 2016-11-29 NOTE — Patient Instructions (Addendum)
Cholesterol Cholesterol is a fat. Your body needs a small amount of cholesterol. Cholesterol (plaque) may build up in your blood vessels (arteries). That makes you more likely to have a heart attack or stroke. You cannot feel your cholesterol level. Having a blood test is the only way to find out if your level is high. Keep your test results. Work with your doctor to keep your cholesterol at a good level. What do the results mean?  Total cholesterol is how much cholesterol is in your blood.  LDL is bad cholesterol. This is the type that can build up. Try to have low LDL.  HDL is good cholesterol. It cleans your blood vessels and carries LDL away. Try to have high HDL.  Triglycerides are fat that the body can store or burn for energy. What are good levels of cholesterol?  Total cholesterol below 200.  LDL below 100 is good for people who have health risks. LDL below 70 is good for people who have very high risks.  HDL above 40 is good. It is best to have HDL of 60 or higher.  Triglycerides below 150. How can I lower my cholesterol? Diet  Follow your diet program as told by your doctor.  Choose fish, white meat chicken, or [place] that is roasted or baked. Try not to eat red meat, fried foods, sausage, or lunch meats.  Eat lots of fresh fruits and vegetables.  Choose whole grains, beans, pasta, potatoes, and cereals.  Choose olive oil, corn oil, or canola oil. Only use small amounts.  Try not to eat butter, mayonnaise, shortening, or palm kernel oils.  Try not to eat foods with trans fats.  Choose low-fat or nonfat dairy foods.  Drink skim or nonfat milk.  Eat low-fat or nonfat yogurt and cheeses.  Try not to drink whole milk or cream.  Try not to eat ice cream, egg yolks, or full-fat cheeses.  Healthy desserts include angel food cake, ginger snaps, animal crackers, hard candy, popsicles, and low-fat or nonfat frozen yogurt. Try not to eat pastries, cakes, pies, and  cookies. Exercise  Follow your exercise program as told by your doctor.  Be more active. Try gardening, walking, and taking the stairs.  Ask your doctor about ways that you can be more active. Medicine  Take over-the-counter and prescription medicines only as told by your doctor. This information is not intended to replace advice given to you by your health care provider. Make sure you discuss any questions you have with your health care provider. Document Released: 12/03/2008 Document Revised: 04/07/2016 Document Reviewed: 03/18/2016 Elsevier Interactive Patient Education  2017 Elsevier Inc.  Back Pain, Adult Back pain is very common. The pain often gets better over time. The cause of back pain is usually not dangerous. Most people can learn to manage their back pain on their own. Follow these instructions at home: Watch your back pain for any changes. The following actions may help to lessen any pain you are feeling:  Stay active. Start with short walks on flat ground if you can. Try to walk farther each day.  Exercise regularly as told by your doctor. Exercise helps your back heal faster. It also helps avoid future injury by keeping your muscles strong and flexible.  Do not sit, drive, or stand in one place for more than 30 minutes.  Do not stay in bed. Resting more than 1-2 days can slow down your recovery.  Be careful when you bend or lift an object.  Use good form when lifting:  Bend at your knees.  Keep the object close to your body.  Do not twist.  Sleep on a firm mattress. Lie on your side, and bend your knees. If you lie on your back, put a pillow under your knees.  Take medicines only as told by your doctor.  Put ice on the injured area.  Put ice in a plastic bag.  Place a towel between your skin and the bag.  Leave the ice on for 20 minutes, 2-3 times a day for the first 2-3 days. After that, you can switch between ice and heat packs.  Avoid feeling anxious  or stressed. Find good ways to deal with stress, such as exercise.  Maintain a healthy weight. Extra weight puts stress on your back. Contact a doctor if:  You have pain that does not go away with rest or medicine.  You have worsening pain that goes down into your legs or buttocks.  You have pain that does not get better in one week.  You have pain at night.  You lose weight.  You have a fever or chills. Get help right away if:  You cannot control when you poop (bowel movement) or pee (urinate).  Your arms or legs feel weak.  Your arms or legs lose feeling (numbness).  You feel sick to your stomach (nauseous) or throw up (vomit).  You have belly (abdominal) pain.  You feel like you may pass out (faint). This information is not intended to replace advice given to you by your health care provider. Make sure you discuss any questions you have with your health care provider. Document Released: 02/23/2008 Document Revised: 02/12/2016 Document Reviewed: 01/08/2014 Elsevier Interactive Patient Education  2017 [place] DASH stands for "Dietary Approaches to Stop Hypertension." The DASH eating plan is a healthy eating plan that has been shown to reduce high blood pressure (hypertension). It may also reduce your risk for type 2 diabetes, heart disease, and stroke. The DASH eating plan may also help with weight loss. What are tips for following this plan? General guidelines   Avoid eating more than 2,300 mg (milligrams) of salt (sodium) a day. If you have hypertension, you may need to reduce your sodium intake to 1,500 mg a day.  Limit alcohol intake to no more than 1 drink a day for nonpregnant women and 2 drinks a day for men. One drink equals 12 oz of beer, 5 oz of wine, or 1 oz of hard liquor.  Work with your health care provider to maintain a healthy body weight or to lose weight. Ask what an ideal weight is for you.  Get at least 30 minutes of exercise  that causes your heart to beat faster (aerobic exercise) most days of the week. Activities may include walking, swimming, or biking.  Work with your health care provider or diet and nutrition specialist (dietitian) to adjust your eating plan to your individual calorie needs. Reading food labels   Check food labels for the amount of sodium per serving. Choose foods with less than 5 percent of the Daily Value of sodium. Generally, foods with less than 300 mg of sodium per serving fit into this eating plan.  To find whole grains, look for the word "whole" as the first word in the ingredient list. Shopping   Buy products labeled as "low-sodium" or "no salt added."  Buy fresh foods. Avoid canned foods and premade or frozen meals. Cooking  Avoid adding salt when cooking. Use salt-free seasonings or herbs instead of table salt or sea salt. Check with your health care provider or pharmacist before using salt substitutes.  Do not fry foods. Cook foods using healthy methods such as baking, boiling, grilling, and broiling instead.  Cook with heart-healthy oils, such as olive, canola, soybean, or sunflower oil. Meal planning    Eat a balanced diet that includes:  5 or more servings of fruits and vegetables each day. At each meal, try to fill half of your plate with fruits and vegetables.  Up to 6-8 servings of whole grains each day.  Less than 6 oz of lean meat, poultry, or fish each day. A 3-oz serving of meat is about the same size as a deck of cards. One egg equals 1 oz.  2 servings of low-fat dairy each day.  A serving of nuts, seeds, or beans 5 times each week.  Heart-healthy fats. Healthy fats called Omega-3 fatty acids are found in foods such as flaxseeds and coldwater fish, like sardines, salmon, and mackerel.  Limit how much you eat of the following:  Canned or prepackaged foods.  Food that is high in trans fat, such as fried foods.  Food that is high in saturated fat, such  as fatty meat.  Sweets, desserts, sugary drinks, and other foods with added sugar.  Full-fat dairy products.  Do not salt foods before eating.  Try to eat at least 2 vegetarian meals each week.  Eat more home-cooked food and less restaurant, buffet, and fast food.  When eating at a restaurant, ask that your food be prepared with less salt or no salt, if possible. What foods are recommended? The items listed may not be a complete list. Talk with your dietitian about what dietary choices are best for you. Grains  Whole-grain or whole-wheat bread. Whole-grain or whole-wheat pasta. Brown rice. [place]. Bulgur. Whole-grain and low-sodium cereals. Pita bread. Low-fat, low-sodium crackers. Whole-wheat flour tortillas. Vegetables  Fresh or frozen vegetables (raw, steamed, roasted, or grilled). Low-sodium or reduced-sodium tomato and vegetable juice. Low-sodium or reduced-sodium tomato sauce and tomato paste. Low-sodium or reduced-sodium canned vegetables. Fruits  All fresh, dried, or frozen fruit. Canned fruit in natural juice (without added sugar). Meat and other protein foods  Skinless chicken or [place]. Ground chicken or [place]. Pork with fat trimmed off. Fish and seafood. Egg whites. Dried beans, peas, or lentils. Unsalted nuts, nut butters, and seeds. Unsalted canned beans. Lean cuts of beef with fat trimmed off. Low-sodium, lean deli meat. Dairy  Low-fat (1%) or fat-free (skim) milk. Fat-free, low-fat, or reduced-fat cheeses. Nonfat, low-sodium ricotta or cottage cheese. Low-fat or nonfat yogurt. Low-fat, low-sodium cheese. Fats and oils  Soft margarine without trans fats. Vegetable oil. Low-fat, reduced-fat, or light mayonnaise and salad dressings (reduced-sodium). Canola, safflower, olive, soybean, and sunflower oils. Avocado. Seasoning and other foods  Herbs. Spices. Seasoning mixes without salt. Unsalted popcorn and pretzels. Fat-free sweets. What foods are not  recommended? The items listed may not be a complete list. Talk with your dietitian about what dietary choices are best for you. Grains  Baked goods made with fat, such as croissants, muffins, or some breads. Dry pasta or rice meal packs. Vegetables  Creamed or fried vegetables. Vegetables in a cheese sauce. Regular canned vegetables (not low-sodium or reduced-sodium). Regular canned tomato sauce and paste (not low-sodium or reduced-sodium). Regular tomato and vegetable juice (not low-sodium or reduced-sodium). Angie Fava. Olives. Fruits  Canned fruit in a light  or heavy syrup. Fried fruit. Fruit in cream or butter sauce. Meat and other protein foods  Fatty cuts of meat. Ribs. Fried meat. Berniece Salines. Sausage. Bologna and other processed lunch meats. Salami. Fatback. Hotdogs. Bratwurst. Salted nuts and seeds. Canned beans with added salt. Canned or smoked fish. Whole eggs or egg yolks. Chicken or [place] with skin. Dairy  Whole or 2% milk, cream, and half-and-half. Whole or full-fat cream cheese. Whole-fat or sweetened yogurt. Full-fat cheese. Nondairy creamers. Whipped toppings. Processed cheese and cheese spreads. Fats and oils  Butter. Stick margarine. Lard. Shortening. Ghee. Bacon fat. Tropical oils, such as coconut, palm kernel, or palm oil. Seasoning and other foods  Salted popcorn and pretzels. Onion salt, garlic salt, seasoned salt, table salt, and sea salt. Worcestershire sauce. Tartar sauce. Barbecue sauce. Teriyaki sauce. Soy sauce, including reduced-sodium. Steak sauce. Canned and packaged gravies. Fish sauce. Oyster sauce. Cocktail sauce. Horseradish that you find on the shelf. Ketchup. Mustard. Meat flavorings and tenderizers. Bouillon cubes. Hot sauce and Tabasco sauce. Premade or packaged marinades. Premade or packaged taco seasonings. Relishes. Regular salad dressings. Where to find more information:  National Heart, Lung, and [place]: https://wilson-eaton.com/  American Heart Association:  www.heart.org Summary  The DASH eating plan is a healthy eating plan that has been shown to reduce high blood pressure (hypertension). It may also reduce your risk for type 2 diabetes, heart disease, and stroke.  With the DASH eating plan, you should limit salt (sodium) intake to 2,300 mg a day. If you have hypertension, you may need to reduce your sodium intake to 1,500 mg a day.  When on the DASH eating plan, aim to eat more fresh fruits and vegetables, whole grains, lean proteins, low-fat dairy, and heart-healthy fats.  Work with your health care provider or diet and nutrition specialist (dietitian) to adjust your eating plan to your individual calorie needs. This information is not intended to replace advice given to you by your health care provider. Make sure you discuss any questions you have with your health care provider. Document Released: 08/26/2011 Document Revised: 08/30/2016 Document Reviewed: 08/30/2016 Elsevier Interactive Patient Education  2017 [employer].

## 2016-11-30 LAB — POCT URINALYSIS DIP (DEVICE)
BILIRUBIN URINE: NEGATIVE
Glucose, UA: NEGATIVE mg/dL
HGB URINE DIPSTICK: NEGATIVE
Ketones, ur: NEGATIVE mg/dL
Leukocytes, UA: NEGATIVE
Nitrite: NEGATIVE
PH: 6 (ref 5.0–8.0)
PROTEIN: NEGATIVE mg/dL
SPECIFIC GRAVITY, URINE: 1.025 (ref 1.005–1.030)
Urobilinogen, UA: 0.2 mg/dL (ref 0.0–1.0)

## 2016-12-10 ENCOUNTER — Ambulatory Visit: Payer: Self-pay | Admitting: Family Medicine

## 2016-12-29 ENCOUNTER — Ambulatory Visit: Payer: Self-pay | Admitting: Family Medicine

## 2016-12-31 ENCOUNTER — Other Ambulatory Visit: Payer: Self-pay | Admitting: Family Medicine

## 2016-12-31 DIAGNOSIS — G629 Polyneuropathy, unspecified: Secondary | ICD-10-CM

## 2017-01-18 ENCOUNTER — Encounter: Payer: Self-pay | Admitting: Family Medicine

## 2017-01-18 ENCOUNTER — Ambulatory Visit (INDEPENDENT_AMBULATORY_CARE_PROVIDER_SITE_OTHER): Payer: Self-pay | Admitting: Family Medicine

## 2017-01-18 VITALS — BP 134/72 | HR 77 | Temp 98.3°F | Resp 16 | Ht 64.0 in | Wt 135.0 lb

## 2017-01-18 DIAGNOSIS — F339 Major depressive disorder, recurrent, unspecified: Secondary | ICD-10-CM

## 2017-01-18 DIAGNOSIS — M5442 Lumbago with sciatica, left side: Secondary | ICD-10-CM

## 2017-01-18 DIAGNOSIS — M5441 Lumbago with sciatica, right side: Secondary | ICD-10-CM

## 2017-01-18 DIAGNOSIS — G629 Polyneuropathy, unspecified: Secondary | ICD-10-CM

## 2017-01-18 LAB — POCT URINALYSIS DIP (DEVICE)
BILIRUBIN URINE: NEGATIVE
GLUCOSE, UA: NEGATIVE mg/dL
Hgb urine dipstick: NEGATIVE
Ketones, ur: NEGATIVE mg/dL
LEUKOCYTES UA: NEGATIVE
Nitrite: NEGATIVE
Protein, ur: NEGATIVE mg/dL
Specific Gravity, Urine: 1.025 (ref 1.005–1.030)
Urobilinogen, UA: 0.2 mg/dL (ref 0.0–1.0)
pH: 6.5 (ref 5.0–8.0)

## 2017-01-18 MED ORDER — GABAPENTIN 400 MG PO CAPS
400.0000 mg | ORAL_CAPSULE | Freq: Four times a day (QID) | ORAL | 1 refills | Status: DC
Start: 2017-01-18 — End: 2017-02-10

## 2017-01-18 MED ORDER — DULOXETINE HCL 30 MG PO CPEP: 60.0000 mg | ORAL_CAPSULE | Freq: Every day | ORAL | 5 refills | Status: DC

## 2017-01-18 MED ORDER — KETOROLAC TROMETHAMINE 60 MG/2ML IM SOLN
60.0000 mg | Freq: Once | INTRAMUSCULAR | Status: AC
  Administered 2017-01-18: 60 mg via INTRAMUSCULAR

## 2017-01-18 NOTE — Progress Notes (Signed)
Patient ID: Kelsey Wade, female    DOB: 05-15-1961, 56 y.o.   MRN: [id number]  PCP: Dorena Dew, FNP  Chief Complaint  Patient presents with  . Back Pain  . Numbness    RIGHT SIDE COME AND GOES  . Depression    Subjective:  HPI Kelsey Wade is a 56 y.o. female presents for evaluation chronic back pain and lateral right sided thoracic pain. Current problem includes tobacco addiction, essential hypertension, neuropathy, and hyperlipidemia. Ms. Mctigue is currently uninsured and has chronic back pain.  She has not completed the application for orange card and or [place] financial assistance. Today she complains of ride-sided low back pain with numbness. The numbness comes and goes, however the pain is more persistent. Reports recent right arm numbness and weakness to the point she reports occasionally dropping things. Feels weakness if resulting from lower back pain.  Back pain is exacerbated with mopping, prolonged standing, exacerbated by long periods of standing. Prior imaging, 10/16/2016, noted degenerative changes in the lumbosacral junction.  She tried lidocaine patches, amitriptyline, NSAIDS, currently taking Gabapentin and Cymbalta which she reports is not working. Feels nothing has or is currently working to relieve her pain.  Social History   Social History  . Marital status: Divorced    Spouse name: N/A  . Number of children: N/A  . Years of education: N/A   Occupational History  . Not on file.   Social History Main Topics  . Smoking status: Current Every Day Smoker    Packs/day: 0.50    Types: Cigarettes  . Smokeless tobacco: Current User  . Alcohol use Yes     Comment: Occasionally   . Drug use: Yes    Types: Marijuana     Comment: daily   . Sexual activity: Not on file   Other Topics Concern  . Not on file   Social History Narrative  . No narrative on file   History reviewed. No pertinent family history.  Review of Systems See  HPI  Patient Active Problem List   Diagnosis Date Noted  . Hyperlipidemia LDL goal <100 11/15/2016  . Essential hypertension 11/12/2016  . Neuropathy 11/12/2016  . Tobacco dependence 11/12/2016    No Known Allergies  Prior to Admission medications   Medication Sig Start Date End Date Taking? Authorizing Provider  amLODipine (NORVASC) 10 MG tablet Take 1 tablet (10 mg total) by mouth daily. 11/29/16  Yes Dorena Dew, FNP  aspirin EC 81 MG tablet Take 1 tablet (81 mg total) by mouth daily. 11/15/16  Yes Dorena Dew, FNP  atorvastatin (LIPITOR) 20 MG tablet Take 1 tablet (20 mg total) by mouth daily. 11/15/16  Yes Dorena Dew, FNP  DULoxetine (CYMBALTA) 30 MG capsule Take 1 capsule (30 mg total) by mouth daily. 11/29/16  Yes Dorena Dew, FNP  gabapentin (NEURONTIN) 300 MG capsule TAKE 1 CAPSULE (300 MG TOTAL) BY MOUTH 3 (THREE) TIMES DAILY. 12/31/16  Yes Dorena Dew, FNP  omeprazole (PRILOSEC) 20 MG capsule Take 1 capsule (20 mg total) by mouth daily. 11/12/16  Yes Dorena Dew, FNP    Past Medical, Surgical Family and Social History reviewed and updated.    Objective:   Today's Vitals   01/18/17 1114  BP: (!) 160/80  Pulse: 77  Resp: 16  Temp: 98.3 F (36.8 C)  TempSrc: Oral  SpO2: 99%  Weight: 135 lb (61.2 kg)  Height: 5\' 4"  (1.626 m)    Wt Readings from  Last 3 Encounters:  01/18/17 135 lb (61.2 kg)  11/29/16 138 lb 12.8 oz (63 kg)  11/12/16 140 lb (63.5 kg)    Physical Exam  Constitutional: She is oriented to person, place, and time. She appears well-developed and well-nourished.  HENT:  Head: Normocephalic and atraumatic.  Eyes: Conjunctivae and EOM are normal. Pupils are equal, round, and reactive to light.  Neck: Normal range of motion.  Cardiovascular: Normal rate, regular rhythm, normal heart sounds and intact distal pulses.   Pulmonary/Chest: Effort normal.  Musculoskeletal: She exhibits tenderness.  Negative for bony tenderness or  visible deformity   Neurological: She is alert and oriented to person, place, and time.  Reflex Scores:      Patellar reflexes are 2+ on the right side and 2+ on the left side.      Achilles reflexes are 2+ on the right side and 2+ on the left side. Skin: Skin is warm and dry.  Psychiatric: She has a normal mood and affect. Her behavior is normal. Judgment and thought content normal.     Assessment & Plan:  1. Depression, recurrent ([place]) 2. Neuropathy 3. Acute bilateral low back pain with bilateral sciatica  Increasing Gabapentin 400 mg, 4 times daily and Cymbalta 60 mg daily for depression, neuropathy, and acute bilateral low back pain with sciatica.  Toradol 60 mg IM given today during office visit for acute low back painful episode.  Obtain some source of financial assistance in order to be referred to neurology to evaluate weakness and sciatic back pain.  RTC:  You have a follow-up already scheduled with Ms. [place] Hollis, FNP-C in June  Malyiah Fellows S. Kenton Kingfisher, MSN, [facility] [address] [place], [place] [address] [phone]

## 2017-01-18 NOTE — Patient Instructions (Addendum)
Please complete applications for the [facility] and [place] Patient assistance in order to have you referred to neuro-surgeon for further evaluation of ongoing sciatic nerve pain and arm weakness.   For now, I have increased your Cymbalta from 30 mg to 60 mg and increased your Gabapentin from 300 mg three times day to Gabapentin 400 mg four times daily.   Please keep follow-up with Ms. Hollis in June.    Chronic Back Pain When back pain lasts longer than 3 months, it is called chronic back pain.The cause of your back pain may not be known. Some common causes include:  Wear and tear (degenerative disease) of the bones, ligaments, or disks in your back.  Inflammation and stiffness in your back (arthritis). People who have chronic back pain often go through certain periods in which the pain is more intense (flare-ups). Many people can learn to manage the pain with home care. Follow these instructions at home: Pay attention to any changes in your symptoms. Take these actions to help with your pain: Activity   Avoid bending and activities that make the problem worse.  Do not sit or stand in one place for long periods of time.  Take brief periods of rest throughout the day. This will reduce your pain. Resting in a lying or standing position is usually better than sitting to rest.  When you are resting for longer periods, mix in some mild activity or stretching between periods of rest. This will help to prevent stiffness and pain.  Get regular exercise. Ask your health care provider what activities are safe for you.  Do not lift anything that is heavier than 10 lb (4.5 kg). Always use proper lifting technique, which includes:  Bending your knees.  Keeping the load close to your body.  Avoiding twisting. Managing pain   If directed, apply ice to the painful area. Your health care provider may recommend applying ice during the first 24-48 hours after a flare-up begins.  Put ice  in a plastic bag.  Place a towel between your skin and the bag.  Leave the ice on for 20 minutes, 2-3 times per day.  After icing, apply heat to the affected area as often as told by your health care provider. Use the heat source that your health care provider recommends, such as a moist heat pack or a heating pad.  Place a towel between your skin and the heat source.  Leave the heat on for 20-30 minutes.  Remove the heat if your skin turns bright red. This is especially important if you are unable to feel pain, heat, or cold. You may have a greater risk of getting burned.  Try soaking in a warm tub.  Take over-the-counter and prescription medicines only as told by your health care provider.  Keep all follow-up visits as told by your health care provider. This is important. Contact a health care provider if:  You have pain that is not relieved with rest or medicine. Get help right away if:  You have weakness or numbness in one or both of your legs or feet.  You have trouble controlling your bladder or your bowels.  You have nausea or vomiting.  You have pain in your abdomen.  You have shortness of breath or you faint. This information is not intended to replace advice given to you by your health care provider. Make sure you discuss any questions you have with your health care provider. Document Released: 10/14/2004 Document Revised:  01/15/2016 Document Reviewed: 02/24/2015 Elsevier Interactive Patient Education  2017 [employer].

## 2017-01-19 LAB — POCT URINALYSIS DIP (DEVICE)
Bilirubin Urine: NEGATIVE
GLUCOSE, UA: NEGATIVE mg/dL
Ketones, ur: NEGATIVE mg/dL
Leukocytes, UA: NEGATIVE
Nitrite: NEGATIVE
PH: 6 (ref 5.0–8.0)
PROTEIN: NEGATIVE mg/dL
Urobilinogen, UA: 0.2 mg/dL (ref 0.0–1.0)

## 2017-02-02 ENCOUNTER — Ambulatory Visit: Payer: Self-pay

## 2017-02-10 ENCOUNTER — Encounter (HOSPITAL_COMMUNITY): Payer: Self-pay

## 2017-02-10 ENCOUNTER — Emergency Department (HOSPITAL_COMMUNITY): Payer: Medicaid Other

## 2017-02-10 ENCOUNTER — Emergency Department (HOSPITAL_COMMUNITY)
Admission: EM | Admit: 2017-02-10 | Discharge: 2017-02-10 | Disposition: A | Discharge status: Home / Self Care | Payer: Medicaid Other | Attending: Emergency Medicine | Admitting: Emergency Medicine

## 2017-02-10 DIAGNOSIS — G8929 Other chronic pain: Secondary | ICD-10-CM

## 2017-02-10 DIAGNOSIS — I10 Essential (primary) hypertension: Secondary | ICD-10-CM | POA: Diagnosis not present

## 2017-02-10 DIAGNOSIS — M546 Pain in thoracic spine: Secondary | ICD-10-CM | POA: Insufficient documentation

## 2017-02-10 DIAGNOSIS — Z7982 Long term (current) use of aspirin: Secondary | ICD-10-CM | POA: Diagnosis not present

## 2017-02-10 DIAGNOSIS — M545 Low back pain: Secondary | ICD-10-CM | POA: Diagnosis not present

## 2017-02-10 DIAGNOSIS — R531 Weakness: Secondary | ICD-10-CM | POA: Insufficient documentation

## 2017-02-10 DIAGNOSIS — Z79899 Other long term (current) drug therapy: Secondary | ICD-10-CM | POA: Insufficient documentation

## 2017-02-10 DIAGNOSIS — F17210 Nicotine dependence, cigarettes, uncomplicated: Secondary | ICD-10-CM | POA: Diagnosis not present

## 2017-02-10 DIAGNOSIS — M549 Dorsalgia, unspecified: Secondary | ICD-10-CM

## 2017-02-10 LAB — BASIC METABOLIC PANEL
Anion gap: 9 (ref 5–15)
BUN: 11 mg/dL (ref 6–20)
CHLORIDE: 105 mmol/L (ref 101–111)
CO2: 24 mmol/L (ref 22–32)
Calcium: 9.1 mg/dL (ref 8.9–10.3)
Creatinine, Ser: 0.72 mg/dL (ref 0.44–1.00)
GFR calc non Af Amer: >60 mL/min (ref >60)
Glucose, Bld: 104 mg/dL — ABNORMAL HIGH (ref 65–99)
POTASSIUM: 4 mmol/L (ref 3.5–5.1)
SODIUM: 138 mmol/L (ref 135–145)

## 2017-02-10 LAB — CBC WITH DIFFERENTIAL/PLATELET
BASOS PCT: 0 %
Basophils Absolute: 0 10*3/uL (ref 0.0–0.1)
EOS ABS: 0 10*3/uL (ref 0.0–0.7)
Eosinophils Relative: 1 %
HCT: 47.9 % — ABNORMAL HIGH (ref 36.0–46.0)
HEMOGLOBIN: 15.9 g/dL — AB (ref 12.0–15.0)
LYMPHS ABS: 2.5 10*3/uL (ref 0.7–4.0)
Lymphocytes Relative: 34 %
MCH: 29.9 pg (ref 26.0–34.0)
MCHC: 33.2 g/dL (ref 30.0–36.0)
MCV: 90.2 fL (ref 78.0–100.0)
Monocytes Absolute: 0.5 10*3/uL (ref 0.1–1.0)
Monocytes Relative: 7 %
NEUTROS ABS: 4.5 10*3/uL (ref 1.7–7.7)
NEUTROS PCT: 58 %
Platelets: 210 10*3/uL (ref 150–400)
RBC: 5.31 MIL/uL — ABNORMAL HIGH (ref 3.87–5.11)
RDW: 13.9 % (ref 11.5–15.5)
WBC: 7.6 10*3/uL (ref 4.0–10.5)

## 2017-02-10 LAB — I-STAT TROPONIN, ED
TROPONIN I, POC: 0.02 ng/mL (ref 0.00–0.08)
Troponin i, poc: 0 ng/mL (ref 0.00–0.08)

## 2017-02-10 MED ORDER — GABAPENTIN 300 MG PO CAPS: 300.0000 mg | ORAL_CAPSULE | Freq: Three times a day (TID) | ORAL | 0 refills | Status: DC

## 2017-02-10 MED ORDER — OXYCODONE-ACETAMINOPHEN 5-325 MG PO TABS
1.0000 | ORAL_TABLET | Freq: Once | ORAL | Status: AC
  Administered 2017-02-10: 1 via ORAL
  Filled 2017-02-10: qty 1

## 2017-02-10 MED ORDER — CYCLOBENZAPRINE HCL 10 MG PO TABS
10.0000 mg | ORAL_TABLET | Freq: Two times a day (BID) | ORAL | 0 refills | Status: DC | PRN
Start: 2017-02-10 — End: 2017-02-17

## 2017-02-10 MED ORDER — IBUPROFEN 200 MG PO TABS
600.0000 mg | ORAL_TABLET | Freq: Once | ORAL | Status: AC
  Administered 2017-02-10: 16:00:00 600 mg via ORAL
  Filled 2017-02-10: qty 1

## 2017-02-10 MED ORDER — OXYCODONE-ACETAMINOPHEN 5-325 MG PO TABS
1.0000 | ORAL_TABLET | ORAL | Status: DC | PRN
  Administered 2017-02-10: 1 via ORAL

## 2017-02-10 MED ORDER — GABAPENTIN 300 MG PO CAPS
300.0000 mg | ORAL_CAPSULE | Freq: Once | ORAL | Status: AC
  Administered 2017-02-10: 300 mg via ORAL
  Filled 2017-02-10: qty 1

## 2017-02-10 MED ORDER — CYCLOBENZAPRINE HCL 10 MG PO TABS
10.0000 mg | ORAL_TABLET | Freq: Once | ORAL | Status: AC
  Administered 2017-02-10: 10 mg via ORAL
  Filled 2017-02-10: qty 1

## 2017-02-10 MED ORDER — OXYCODONE-ACETAMINOPHEN 5-325 MG PO TABS
ORAL_TABLET | ORAL | Status: AC
  Filled 2017-02-10: qty 1

## 2017-02-10 NOTE — ED Provider Notes (Signed)
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 thoracic spinal cord. 2. Chronic wedging/compression deformities of the T2, T5, T11, and T12 vertebral bodies. 3. Left greater than right facet degeneration at T10-11 as above without stenosis. No other significant degenerative changes within the thoracic spine. MRI LUMBAR SPINE IMPRESSION: 1. No acute abnormality within the lumbar spine. 2. 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 3. Shallow left foraminal disc protrusion at L4-5, closely approximating the exiting left L4 nerve root and resulting in moderate left L4 foraminal stenosis. 4. Multilevel facet hypertrophy within the lower lumbar spine, greatest at L4-5 and L5-S1. Electronically Signed   By: Jeannine Boga M.D.   On: 02/10/2017 22:38    Procedures Procedures (including critical care time)  Medications Ordered in ED Medications  ibuprofen (ADVIL,MOTRIN) tablet 600 mg (600 mg Oral Given 02/10/17 1607)  cyclobenzaprine (FLEXERIL) tablet 10 mg (10 mg Oral Given 02/10/17 1607)  gabapentin (NEURONTIN) capsule 300 mg (300 mg Oral Given 02/10/17 1614)  oxyCODONE-acetaminophen  (PERCOCET/ROXICET) 5-325 MG per tablet 1 tablet (1 tablet Oral Given 02/10/17 1820)  oxyCODONE-acetaminophen (PERCOCET/ROXICET) 5-325 MG per tablet 1 tablet (1 tablet Oral Given 02/10/17 2019)     Initial Impression / Assessment and Plan / ED Course  I have reviewed the triage vital signs and the nursing notes.  Pertinent labs & imaging results that were available during my care of the patient were reviewed by me and considered in my medical decision making (see chart for details).     As far as the patient's chest pain, and low suspicion for acute cause of chest pain. No acute ischemic changes or interval abnormalities. No prior EKG to compare to. There appears to be some degree of benign early repolarization in her anterior leads but no ST elevation concerning for myocardial infarction. Patient is a heart score of 3. Delta troponin negative. Doubt ACS. Chest x-ray without evidence of pneumothorax or pneumonia. Narrow mediastinum. Description of symptoms not consistent with aortic dissection or pulmonary embolism.  As far as the patient's back pain, this is chronic in nature. The chest x-ray did show evidence of age indeterminant compression fractures. Patient does have tenderness to palpation along her entire midline spine. No step-offs. Given the fact that the patient is reporting some increased weakness on the right side as well as difficulty with coordinating her right hand, got MRI of the patient's spine to further evaluate for acute compression fractures and possible cord impingement. MRI of the patient's C/T/L-spine shows only chronic issues. No acute findings such as cord impingement or acute fracture. We also got a CT of the patient's head given her report of intermittent garbled speech throughout the last few days. No evidence of acute pathology such as bleed, and no evidence of subacute ischemic injury.  Went to reevaluate the patient at bedside after the extensive workup was performed  here. Patient had taken off most of her monitoring equipment. Reports that we have not been treating her pain appropriately while she was here in the emergency department. Explained to the patient that we have given her multiple modalities of pain medications that would be appropriate for her type of pain. After explaining to the patient that her MRIs did not show any evidence of acute injuries, explaining that we would be discharging her with outpatient follow-up with her primary care doctor as well as giving her information to contact a spine  surgery physician. Explained to the patient that we would only be giving her prescription for gabapentin and Flexeril, encouraged her to use ibuprofen as well. Patient became upset that I was not prescribing her stronger pain medications. Explained to the patient that I do not feel comfortable giving narcotic pain medications from the emergency department for chronic pain. Apologized to the patient and strongly encouraged her to keep her outpatient follow-up appointments.  Patient ambulatory throughout the emergency department and in her hospital room without issue prior to discharge.   Final Clinical Impressions(s) / ED Diagnoses   Final diagnoses:  Back pain  Chronic midline back pain, unspecified back location    New Prescriptions Discharge Medication List as of 02/10/2017 10:57 PM    START taking these medications   Details  cyclobenzaprine (FLEXERIL) 10 MG tablet Take 1 tablet (10 mg total) by mouth 2 (two) times daily as needed for muscle spasms., Starting Thu 02/10/2017, Print         Maryan Puls, MD 02/11/17 0001    Fredia Sorrow, MD 02/17/17 1150

## 2017-02-10 NOTE — ED Notes (Signed)
Patient transported to MRI 

## 2017-02-10 NOTE — ED Notes (Signed)
Pt ambulatory to the restroom with walker and 1 staff assist. Pt given yellow fall risk socks to wear

## 2017-02-10 NOTE — ED Triage Notes (Addendum)
Pt states she has been having pain to entire right side x 1 week. Pt reports hx of chronic low back pain but states his is different and is in shoulders, right arm, right hand, right hip/leg. She describes pain ad numbness/tingling that causes her to no be able to use right hand and leg gives out. She reports having symptoms of aphasia around 2000 last night. She states she knew what she wanted to say but she "couldn't text, write, or say it. Everything was just mumbo jumbo".  No neuro deficit in triage. PT is tearful with pain

## 2017-02-10 NOTE — ED Notes (Signed)
Patient requested something more for pain.  MD notified.

## 2017-02-10 NOTE — ED Notes (Signed)
Patient ambulated from department with family member. NAD or difficulty.

## 2017-02-10 NOTE — ED Notes (Signed)
Pt sts she has had an MRI before and is not claustrophobic. No meds required

## 2017-02-10 NOTE — ED Notes (Signed)
Patient family member our to nurse's station stating "if anybody has something to say to her, ya'll better come and do it". This RN walked to patient's door and patient is frantically walking around the room, speaking loudly "I have been here since noon and I don't feel a bit better, they ain't gave me nothing for pain...". At that moment ED Resident Verlene Mayer walked up and entered room to speak with patient.

## 2017-02-17 ENCOUNTER — Ambulatory Visit (INDEPENDENT_AMBULATORY_CARE_PROVIDER_SITE_OTHER): Payer: Self-pay | Admitting: Family Medicine

## 2017-02-17 ENCOUNTER — Encounter: Payer: Self-pay | Admitting: Family Medicine

## 2017-02-17 VITALS — BP 142/90 | HR 83 | Temp 98.3°F | Resp 14 | Ht 64.0 in | Wt 133.0 lb

## 2017-02-17 DIAGNOSIS — Z114 Encounter for screening for human immunodeficiency virus [HIV]: Secondary | ICD-10-CM

## 2017-02-17 DIAGNOSIS — F419 Anxiety disorder, unspecified: Secondary | ICD-10-CM

## 2017-02-17 DIAGNOSIS — M5441 Lumbago with sciatica, right side: Secondary | ICD-10-CM

## 2017-02-17 DIAGNOSIS — I10 Essential (primary) hypertension: Secondary | ICD-10-CM

## 2017-02-17 DIAGNOSIS — G629 Polyneuropathy, unspecified: Secondary | ICD-10-CM

## 2017-02-17 DIAGNOSIS — M62830 Muscle spasm of back: Secondary | ICD-10-CM

## 2017-02-17 DIAGNOSIS — G8929 Other chronic pain: Secondary | ICD-10-CM

## 2017-02-17 DIAGNOSIS — Z1159 Encounter for screening for other viral diseases: Secondary | ICD-10-CM

## 2017-02-17 DIAGNOSIS — F17200 Nicotine dependence, unspecified, uncomplicated: Secondary | ICD-10-CM

## 2017-02-17 LAB — HEPATITIS C ANTIBODY: HCV Ab: REACTIVE — AB

## 2017-02-17 MED ORDER — BUSPIRONE HCL 10 MG PO TABS: 10.0000 mg | ORAL_TABLET | Freq: Two times a day (BID) | ORAL | 2 refills | Status: DC

## 2017-02-17 MED ORDER — GABAPENTIN 300 MG PO CAPS: 300.0000 mg | ORAL_CAPSULE | Freq: Four times a day (QID) | ORAL | 1 refills | Status: DC

## 2017-02-17 MED ORDER — METHOCARBAMOL 500 MG PO TABS: 500.0000 mg | ORAL_TABLET | Freq: Three times a day (TID) | ORAL | 1 refills | Status: DC | PRN

## 2017-02-17 MED ORDER — KETOROLAC TROMETHAMINE 60 MG/2ML IM SOLN
60.0000 mg | Freq: Once | INTRAMUSCULAR | Status: AC
  Administered 2017-02-17: 60 mg via INTRAMUSCULAR

## 2017-02-17 NOTE — Patient Instructions (Addendum)
   Anxiety and depression:  Buspar 10 mg twice daily for depression and anxiety Will send a referral to psychiatry   Chronc back pain:   Will start a trial of Robaxin 500 mg every 8 hours as needed. Do not drink alcohol, drive, or operate machinery

## 2017-02-18 LAB — HIV ANTIBODY (ROUTINE TESTING W REFLEX): HIV 1&2 Ab, 4th Generation: NONREACTIVE

## 2017-02-20 NOTE — Progress Notes (Signed)
[name], a 56 year old female with a history of chronic back pain and depression presents for a follow up of chronic back pain. She was evaluated for back pain in the emergency department on 02/10/2017. Kelsey Wade says that she left prior to receiving treatment. She had a MRI of the lumbar, thoracic, and cervical spine. She was found to have mild degenerative disc bulge at C6-7, chronic wedging deformity of the C7 vertebral body and a small  remote pontine lucunar infarct, and chronic wedging deformities throughout the thoracic spine. She is currently not under the care of an orthopedic specialist.  Symptoms have been present for greater than 1 year and include numbness in lower extremities.  Exacerbating factors identifiable by patient are bending forwards, bending sideways, recumbency, standing and walking. She has been taking anti-inflammatory medications without relief. Current pain intensity is 7/10 and is described as shooting and constant.   Past Medical History:  Diagnosis Date  . Chronic back pain    Social History   Social History  . Marital status: Divorced    Spouse name: N/A  . Number of children: N/A  . Years of education: N/A   Occupational History  . Not on file.   Social History Main Topics  . Smoking status: Current Every Day Smoker    Packs/day: 0.50    Types: Cigarettes  . Smokeless tobacco: Current User  . Alcohol use Yes     Comment: Occasionally   . Drug use: Yes    Types: Marijuana     Comment: daily   . Sexual activity: Not on file   Other Topics Concern  . Not on file   Social History Narrative  . No narrative on file   Immunization History  Administered Date(s) Administered  . Pneumococcal Polysaccharide-23 11/12/2016  . Tdap 11/12/2016   Review of Systems  Constitutional: Positive for malaise/fatigue.  HENT: Negative.   Eyes: Negative.   Respiratory: Negative.   Gastrointestinal: Negative.   Genitourinary: Negative.    Musculoskeletal: Positive for back pain and myalgias.  Skin: Negative.   Neurological: Negative.   Psychiatric/Behavioral: The patient is nervous/anxious.    Physical Exam  Constitutional: She is well-developed, well-nourished, and in no distress.  HENT:  Head: Normocephalic and atraumatic.  Right Ear: External ear normal.  Eyes: Conjunctivae are normal. Pupils are equal, round, and reactive to light.  Neck: Normal range of motion. Neck supple.  Cardiovascular: Normal rate, regular rhythm, normal heart sounds and intact distal pulses.   Pulmonary/Chest: Effort normal and breath sounds normal.  Abdominal: Soft. Bowel sounds are normal.  Musculoskeletal:       Lumbar back: She exhibits decreased range of motion, tenderness, pain and spasm. She exhibits no swelling and no edema.  Skin: Skin is warm and dry.  Psychiatric: Mood, memory, affect and judgment normal.  BP (!) 142/90 (BP Location: Right Arm, Patient Position: Sitting, Cuff Size: Normal) Comment: manual  Pulse 83   Temp 98.3 F (36.8 C) (Oral)   Resp 14   Ht 5\' 4"  (1.626 m)   Wt 133 lb (60.3 kg)   SpO2 99%   BMI 22.83 kg/m   Plan   1. Chronic right-sided low back pain with right-sided sciatica Reviewed MRI, patient warrants an orthopedic evaluation at this juncture. I will defer to orthopedic services for further work-up and evaluation.  - AMB referral to orthopedics - methocarbamol (ROBAXIN) 500 MG tablet; Take 1 tablet (500 mg total) by mouth every 8 (eight) hours  as needed for muscle spasms.  Dispense: 60 tablet; Refill: 1 - gabapentin (NEURONTIN) 300 MG capsule; Take 1 capsule (300 mg total) by mouth 4 (four) times daily.  Dispense: 120 capsule; Refill: 1 - ketorolac (TORADOL) injection 60 mg; Inject 2 mLs (60 mg total) into the muscle once.  2. Back spasm - methocarbamol (ROBAXIN) 500 MG tablet; Take 1 tablet (500 mg total) by mouth every 8 (eight) hours as needed for muscle spasms.  Dispense: 60 tablet; Refill:  1  3. Neuropathy Patient also warrants a referral to neurology referral for worsening neuropathy and sciatica.  - gabapentin (NEURONTIN) 300 MG capsule; Take 1 capsule (300 mg total) by mouth 4 (four) times daily.  Dispense: 120 capsule; Refill: 1  4. Tobacco dependence Smoking cessation instruction/counseling given:  counseled patient on the dangers of tobacco use, advised patient to stop smoking, and reviewed strategies to maximize success  5. Essential hypertension Blood pressure is above goal on current medication regimen. She did not take medication prior to arrival. Discussed the importance of adhering to medication regimen.   6. Need for hepatitis C screening test - Hepatitis C Antibody  7. Screening for HIV (human immunodeficiency virus) - HIV antibody (with reflex)  8. Anxiety Started a trial of Buspar 10 mg BID  RTC: 1 month for anxiety and back pain    Donia Pounds  MSN, FNP-C [address] [place], [place] [id number] [phone]   The patient was given clear instructions to go to ER or return to medical center if symptoms do not improve, worsen or new problems develop. The patient verbalized understanding. Will notify patient with laboratory results.

## 2017-02-23 LAB — HEPATITIS C RNA QUANTITATIVE
HCV QUANT LOG: NOT DETECTED {Log_IU}/mL
HCV Quantitative: <15 IU/mL

## 2017-03-01 ENCOUNTER — Ambulatory Visit: Payer: Self-pay | Admitting: Family Medicine

## 2017-03-02 ENCOUNTER — Inpatient Hospital Stay (HOSPITAL_COMMUNITY)
Admission: EM | Admit: 2017-03-02 | Discharge: 2017-03-04 | DRG: 066 | Disposition: A | Discharge status: Home / Self Care | Payer: Medicaid Other | Attending: Internal Medicine | Admitting: Internal Medicine

## 2017-03-02 ENCOUNTER — Inpatient Hospital Stay (HOSPITAL_COMMUNITY): Payer: Medicaid Other

## 2017-03-02 ENCOUNTER — Encounter (INDEPENDENT_AMBULATORY_CARE_PROVIDER_SITE_OTHER): Payer: Self-pay | Admitting: Orthopaedic Surgery

## 2017-03-02 ENCOUNTER — Ambulatory Visit (INDEPENDENT_AMBULATORY_CARE_PROVIDER_SITE_OTHER): Payer: Self-pay | Admitting: Orthopaedic Surgery

## 2017-03-02 ENCOUNTER — Emergency Department (HOSPITAL_COMMUNITY): Payer: Medicaid Other

## 2017-03-02 ENCOUNTER — Encounter (HOSPITAL_COMMUNITY): Payer: Self-pay | Admitting: *Deleted

## 2017-03-02 VITALS — BP 129/83 | HR 74 | Ht 64.0 in | Wt 135.0 lb

## 2017-03-02 DIAGNOSIS — F17210 Nicotine dependence, cigarettes, uncomplicated: Secondary | ICD-10-CM | POA: Diagnosis present

## 2017-03-02 DIAGNOSIS — F17200 Nicotine dependence, unspecified, uncomplicated: Secondary | ICD-10-CM | POA: Diagnosis present

## 2017-03-02 DIAGNOSIS — F1010 Alcohol abuse, uncomplicated: Secondary | ICD-10-CM | POA: Diagnosis present

## 2017-03-02 DIAGNOSIS — F419 Anxiety disorder, unspecified: Secondary | ICD-10-CM | POA: Diagnosis present

## 2017-03-02 DIAGNOSIS — G8929 Other chronic pain: Secondary | ICD-10-CM | POA: Diagnosis present

## 2017-03-02 DIAGNOSIS — I10 Essential (primary) hypertension: Secondary | ICD-10-CM | POA: Diagnosis present

## 2017-03-02 DIAGNOSIS — Z9119 Patient's noncompliance with other medical treatment and regimen: Secondary | ICD-10-CM | POA: Diagnosis not present

## 2017-03-02 DIAGNOSIS — I639 Cerebral infarction, unspecified: Secondary | ICD-10-CM | POA: Diagnosis not present

## 2017-03-02 DIAGNOSIS — F329 Major depressive disorder, single episode, unspecified: Secondary | ICD-10-CM | POA: Diagnosis present

## 2017-03-02 DIAGNOSIS — M6289 Other specified disorders of muscle: Secondary | ICD-10-CM

## 2017-03-02 DIAGNOSIS — Z7902 Long term (current) use of antithrombotics/antiplatelets: Secondary | ICD-10-CM

## 2017-03-02 DIAGNOSIS — Z7982 Long term (current) use of aspirin: Secondary | ICD-10-CM | POA: Diagnosis not present

## 2017-03-02 DIAGNOSIS — Z79899 Other long term (current) drug therapy: Secondary | ICD-10-CM | POA: Diagnosis not present

## 2017-03-02 DIAGNOSIS — E785 Hyperlipidemia, unspecified: Secondary | ICD-10-CM | POA: Diagnosis present

## 2017-03-02 DIAGNOSIS — R531 Weakness: Secondary | ICD-10-CM

## 2017-03-02 DIAGNOSIS — K219 Gastro-esophageal reflux disease without esophagitis: Secondary | ICD-10-CM | POA: Diagnosis present

## 2017-03-02 DIAGNOSIS — I63412 Cerebral infarction due to embolism of left middle cerebral artery: Secondary | ICD-10-CM

## 2017-03-02 DIAGNOSIS — I6522 Occlusion and stenosis of left carotid artery: Secondary | ICD-10-CM

## 2017-03-02 HISTORY — DX: Tobacco use: Z72.0

## 2017-03-02 HISTORY — DX: Cerebral infarction, unspecified: I63.9

## 2017-03-02 HISTORY — DX: Hyperlipidemia, unspecified: E78.5

## 2017-03-02 HISTORY — DX: Alcohol abuse, uncomplicated: F10.10

## 2017-03-02 LAB — CK: Total CK: 184 U/L (ref 38–234)

## 2017-03-02 LAB — PROTIME-INR
INR: 0.98
Prothrombin Time: 13 seconds (ref 11.4–15.2)

## 2017-03-02 LAB — COMPREHENSIVE METABOLIC PANEL
ALBUMIN: 4 g/dL (ref 3.5–5.0)
ALK PHOS: 74 U/L (ref 38–126)
ALT: 27 U/L (ref 14–54)
AST: 25 U/L (ref 15–41)
Anion gap: 8 (ref 5–15)
BILIRUBIN TOTAL: 0.6 mg/dL (ref 0.3–1.2)
BUN: 9 mg/dL (ref 6–20)
CO2: 25 mmol/L (ref 22–32)
CREATININE: 0.73 mg/dL (ref 0.44–1.00)
Calcium: 9.3 mg/dL (ref 8.9–10.3)
Chloride: 105 mmol/L (ref 101–111)
GFR calc Af Amer: >60 mL/min (ref >60)
GLUCOSE: 133 mg/dL — AB (ref 65–99)
Potassium: 4.2 mmol/L (ref 3.5–5.1)
Sodium: 138 mmol/L (ref 135–145)
TOTAL PROTEIN: 7.2 g/dL (ref 6.5–8.1)

## 2017-03-02 LAB — CBC
HEMATOCRIT: 47.7 % — AB (ref 36.0–46.0)
HEMOGLOBIN: 15.7 g/dL — AB (ref 12.0–15.0)
MCH: 30.3 pg (ref 26.0–34.0)
MCHC: 32.9 g/dL (ref 30.0–36.0)
MCV: 92.1 fL (ref 78.0–100.0)
Platelets: 244 10*3/uL (ref 150–400)
RBC: 5.18 MIL/uL — AB (ref 3.87–5.11)
RDW: 14.3 % (ref 11.5–15.5)
WBC: 8.7 10*3/uL (ref 4.0–10.5)

## 2017-03-02 LAB — I-STAT TROPONIN, ED: TROPONIN I, POC: 0 ng/mL (ref 0.00–0.08)

## 2017-03-02 LAB — DIFFERENTIAL
BASOS ABS: 0 10*3/uL (ref 0.0–0.1)
Basophils Relative: 0 %
Eosinophils Absolute: 0.1 10*3/uL (ref 0.0–0.7)
Eosinophils Relative: 1 %
LYMPHS ABS: 2.2 10*3/uL (ref 0.7–4.0)
LYMPHS PCT: 25 %
MONOS PCT: 5 %
Monocytes Absolute: 0.4 10*3/uL (ref 0.1–1.0)
NEUTROS ABS: 6 10*3/uL (ref 1.7–7.7)
Neutrophils Relative %: 69 %

## 2017-03-02 LAB — APTT: APTT: 33 s (ref 24–36)

## 2017-03-02 MED ORDER — BUSPIRONE HCL 10 MG PO TABS
10.0000 mg | ORAL_TABLET | Freq: Two times a day (BID) | ORAL | Status: DC
  Administered 2017-03-02 – 2017-03-04 (×4): 10 mg via ORAL
  Filled 2017-03-02 (×4): qty 1

## 2017-03-02 MED ORDER — ACETAMINOPHEN 160 MG/5ML PO SOLN: 650.0000 mg | ORAL | Status: DC | PRN

## 2017-03-02 MED ORDER — NICOTINE 21 MG/24HR TD PT24
21.0000 mg | MEDICATED_PATCH | Freq: Every day | TRANSDERMAL | Status: DC
  Administered 2017-03-03 – 2017-03-04 (×2): 21 mg via TRANSDERMAL
  Filled 2017-03-02 (×2): qty 1

## 2017-03-02 MED ORDER — PANTOPRAZOLE SODIUM 40 MG PO TBEC
40.0000 mg | DELAYED_RELEASE_TABLET | Freq: Every day | ORAL | Status: DC
  Administered 2017-03-03 – 2017-03-04 (×2): 40 mg via ORAL
  Filled 2017-03-02 (×2): qty 1

## 2017-03-02 MED ORDER — ATORVASTATIN CALCIUM 40 MG PO TABS
40.0000 mg | ORAL_TABLET | Freq: Every day | ORAL | Status: DC
  Administered 2017-03-03: 40 mg via ORAL
  Filled 2017-03-02: qty 1

## 2017-03-02 MED ORDER — SODIUM CHLORIDE 0.9 % IV SOLN
INTRAVENOUS | Status: AC
  Administered 2017-03-02: 20:00:00 via INTRAVENOUS

## 2017-03-02 MED ORDER — DULOXETINE HCL 30 MG PO CPEP
30.0000 mg | ORAL_CAPSULE | Freq: Every day | ORAL | Status: DC
  Administered 2017-03-03 – 2017-03-04 (×2): 30 mg via ORAL
  Filled 2017-03-02 (×2): qty 1

## 2017-03-02 MED ORDER — GABAPENTIN 300 MG PO CAPS
300.0000 mg | ORAL_CAPSULE | Freq: Four times a day (QID) | ORAL | Status: DC
  Administered 2017-03-02 – 2017-03-04 (×7): 300 mg via ORAL
  Filled 2017-03-02 (×7): qty 1

## 2017-03-02 MED ORDER — ASPIRIN 300 MG RE SUPP: 300.0000 mg | Freq: Every day | RECTAL | Status: DC

## 2017-03-02 MED ORDER — ASPIRIN 325 MG PO TABS
325.0000 mg | ORAL_TABLET | Freq: Every day | ORAL | Status: DC
  Administered 2017-03-03: 325 mg via ORAL
  Filled 2017-03-02: qty 1

## 2017-03-02 MED ORDER — STROKE: EARLY STAGES OF RECOVERY BOOK
Freq: Once | Status: DC
  Filled 2017-03-02: qty 1

## 2017-03-02 MED ORDER — SENNOSIDES-DOCUSATE SODIUM 8.6-50 MG PO TABS: 1.0000 | ORAL_TABLET | Freq: Every evening | ORAL | Status: DC | PRN

## 2017-03-02 MED ORDER — ENOXAPARIN SODIUM 40 MG/0.4ML ~~LOC~~ SOLN
40.0000 mg | SUBCUTANEOUS | Status: DC
  Administered 2017-03-02 – 2017-03-03 (×2): 40 mg via SUBCUTANEOUS
  Filled 2017-03-02 (×2): qty 0.4

## 2017-03-02 MED ORDER — MORPHINE SULFATE (PF) 4 MG/ML IV SOLN
4.0000 mg | Freq: Once | INTRAVENOUS | Status: AC
  Administered 2017-03-02: 4 mg via INTRAVENOUS
  Filled 2017-03-02: qty 1

## 2017-03-02 MED ORDER — METHOCARBAMOL 500 MG PO TABS
500.0000 mg | ORAL_TABLET | Freq: Three times a day (TID) | ORAL | Status: DC | PRN
  Administered 2017-03-02 – 2017-03-04 (×3): 500 mg via ORAL
  Filled 2017-03-02 (×3): qty 1

## 2017-03-02 MED ORDER — ATORVASTATIN CALCIUM 20 MG PO TABS: 20.0000 mg | ORAL_TABLET | Freq: Every day | ORAL | Status: DC

## 2017-03-02 MED ORDER — ACETAMINOPHEN 650 MG RE SUPP: 650.0000 mg | RECTAL | Status: DC | PRN

## 2017-03-02 MED ORDER — ACETAMINOPHEN 325 MG PO TABS
650.0000 mg | ORAL_TABLET | ORAL | Status: DC | PRN
  Administered 2017-03-03: 650 mg via ORAL
  Filled 2017-03-02: qty 2

## 2017-03-02 MED ORDER — FENTANYL CITRATE (PF) 100 MCG/2ML IJ SOLN
50.0000 ug | Freq: Once | INTRAMUSCULAR | Status: AC
  Administered 2017-03-02: 50 ug via INTRAVENOUS
  Filled 2017-03-02: qty 2

## 2017-03-02 NOTE — Consult Note (Signed)
Referring Physician: Dr. Oleta Mouse    Chief Complaint: Three day history of right sided weakness  HPI: Kelsey Wade is an 56 y.o. female who presents from the orthopedics clinic today with a chief complaint of new onset right upper and lower extremity weakness with sensory numbness and speech difficulty.  Orthopedics outpatient note from today describes the following: "56 year old female smoker with recent visit to the ER 02/10/2017 for right side pain for a week had MRI C-spine and T-spine and L-spine. This showed no significant compressive lesions and she has some lab work including troponin and the met which was normal with the exception of glucose of 104. She is here with a walker which she is used for the last 24 hours and states that 3 days ago she woke up with right arm weakness right leg weakness and inability to use her right hand with right leg weakness. She has some difficulty with speech. She's had some numbness on the right side of her face. She states her right leg doesn't work well. She saw Cammie Sickle FNP who reviewed her images which included some mild degenerative changes with bulges C6-7 and some wedging at C7 vertebral body as well as remote pontine lacunar infarct. Some wedging was noted in the thoracic spine. She was placed on Robaxin, Neurontin and a Toradol injection was performed."  Her orthopedic surgeon, Dr. Lorin Mercy, felt that it was most likely that her new symptoms were due to a left hemispheric CVA. She was sent to the ED for further evaluation.   CT head obtained in the ED reveals a medium sized subacute infarct in the posterior left frontal lobe, involving the precentral gyrus. There is also a subacute infarct involving the left caudate head. 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 Additional findings of multiple small remote-appearing infarcts in the bilateral cerebrum and cerebellum.  Past Medical History:  Diagnosis Date  . Chronic back pain      Past Surgical History:  Procedure Laterality Date  . APPENDECTOMY      History reviewed. No pertinent family history. Social History:  reports that she has been smoking Cigarettes.  She has been smoking about 0.50 packs per day. She uses smokeless tobacco. She reports that she drinks alcohol. She reports that she uses drugs, including Marijuana.  Allergies: No Known Allergies  Medications:  amLODipine (NORVASC) 10 MG tablet Take 1 tablet (10 mg total) by mouth daily. Dorena Dew, FNP Needs Review  aspirin EC 81 MG tablet Take 1 tablet (81 mg total) by mouth daily. Dorena Dew, FNP Needs Review   Patient not taking: Reported on 02/17/2017    atorvastatin (LIPITOR) 20 MG tablet Take 1 tablet (20 mg total) by mouth daily. Dorena Dew, FNP Needs Review  busPIRone (BUSPAR) 10 MG tablet Take 1 tablet (10 mg total) by mouth 2 (two) times daily. Dorena Dew, FNP Needs Review  DULoxetine (CYMBALTA) 30 MG capsule  [provider] Needs Review  gabapentin (NEURONTIN) 300 MG capsule Take 1 capsule (300 mg total) by mouth 4 (four) times daily. Dorena Dew, FNP Needs Review  methocarbamol (ROBAXIN) 500 MG tablet Take 1 tablet (500 mg total) by mouth every 8 (eight) hours as needed for muscle spasms. Dorena Dew, FNP Needs Review  omeprazole (PRILOSEC) 20 MG capsule Take 1 capsule (20 mg total) by mouth daily. Dorena Dew, FNP Needs Review  Of note, she states that she is noncompliant with ASA.   ROS:  As per HPI.   Physical Examination: Blood pressure (!) 135/98, pulse 81, temperature 98 F (36.7 C), temperature source Oral, resp. rate 18, SpO2 95 %.  HEENT: [place]/AT Ext: Warm and well-perfused  Neurologic Examination: Mental Status: Alert, oriented, thought content appropriate.  Speech fluent with intact naming and comprehension, except for right-left confusion noted on a directional 2-step command. No dysarthria. Repetition intact.  Cranial  Nerves: II:  Right visual field cut noted. No pupillary asymmetry. III,IV, VI: ptosis not present, EOMI without nystagmus V,VII: smile symmetric, facial temp sensation normal bilaterally VIII: hearing intact to questions and commands IX,X: palate rises symmetrically XI: symmetric XII: midline tongue extension  Motor: RUE: 2-3/5 deltoid. 0/5 triceps, biceps, wrist flex/extend and finger flexion/extension. Decreased tone. RLE: 3-4/5 LUE and LLE: 5/5   Sensory: Hyperesthesia to temperature RUE and RLE. FT intact without extinction.    Deep Tendon Reflexes:  1-2+ bilateral upper and lower extremities, except for hypoactive RUE reflexes.  Plantars: Right: downgoing  Left: downgoing Cerebellar: Unable to perform on right. Normal FNF on left.  Gait: Deferred   Results for orders placed or performed during the hospital encounter of 03/02/17 (from the past 48 hour(s))  Protime-INR     Status: None   Collection Time: 03/02/17 11:59 AM  Result Value Ref Range   Prothrombin Time 13.0 11.4 - 15.2 seconds   INR 0.98   APTT     Status: None   Collection Time: 03/02/17 11:59 AM  Result Value Ref Range   aPTT 33 24 - 36 seconds  CBC     Status: Abnormal   Collection Time: 03/02/17 11:59 AM  Result Value Ref Range   WBC 8.7 4.0 - 10.5 K/uL   RBC 5.18 (H) 3.87 - 5.11 MIL/uL   Hemoglobin 15.7 (H) 12.0 - 15.0 g/dL   HCT 47.7 (H) 36.0 - 46.0 %   MCV 92.1 78.0 - 100.0 fL   MCH 30.3 26.0 - 34.0 pg   MCHC 32.9 30.0 - 36.0 g/dL   RDW 14.3 11.5 - 15.5 %   Platelets 244 150 - 400 K/uL  Differential     Status: None   Collection Time: 03/02/17 11:59 AM  Result Value Ref Range   Neutrophils Relative % 69 %   Neutro Abs 6.0 1.7 - 7.7 K/uL   Lymphocytes Relative 25 %   Lymphs Abs 2.2 0.7 - 4.0 K/uL   Monocytes Relative 5 %   Monocytes Absolute 0.4 0.1 - 1.0 K/uL   Eosinophils Relative 1 %   Eosinophils Absolute 0.1 0.0 - 0.7 K/uL   Basophils Relative 0 %   Basophils Absolute 0.0 0.0 - 0.1 K/uL   Comprehensive metabolic panel     Status: Abnormal   Collection Time: 03/02/17 11:59 AM  Result Value Ref Range   Sodium 138 135 - 145 mmol/L   Potassium 4.2 3.5 - 5.1 mmol/L   Chloride 105 101 - 111 mmol/L   CO2 25 22 - 32 mmol/L   Glucose, Bld 133 (H) 65 - 99 mg/dL   BUN 9 6 - 20 mg/dL   Creatinine, Ser 0.73 0.44 - 1.00 mg/dL   Calcium 9.3 8.9 - 10.3 mg/dL   Total Protein 7.2 6.5 - 8.1 g/dL   Albumin 4.0 3.5 - 5.0 g/dL   AST 25 15 - 41 U/L   ALT 27 14 - 54 U/L   Alkaline Phosphatase 74 38 - 126 U/L   Total Bilirubin 0.6 0.3 - 1.2 mg/dL  GFR calc non Af Amer >60 >60 mL/min   GFR calc Af Amer >60 >60 mL/min    Comment: (NOTE) The eGFR has been calculated using the CKD EPI equation. This calculation has not been validated in all clinical situations. eGFR's persistently <60 mL/min signify possible Chronic Kidney Disease.    Anion gap 8 5 - 15  I-stat troponin, ED     Status: None   Collection Time: 03/02/17 12:23 PM  Result Value Ref Range   Troponin i, poc 0.00 0.00 - 0.08 ng/mL   Comment 3            Comment: Due to the release kinetics of cTnI, a negative result within the first hours of the onset of symptoms does not rule out myocardial infarction with certainty. If myocardial infarction is still suspected, repeat the test at appropriate intervals.    Ct Head Wo Contrast  Result Date: 03/02/2017 CLINICAL DATA:  Right-sided extremity weakness and facial droop for 3 days. Mild diffuse headache. EXAM: CT HEAD WITHOUT CONTRAST TECHNIQUE: Contiguous axial images were obtained from the base of the skull through the vertex without intravenous contrast. COMPARISON:  02/10/2017 FINDINGS: Brain: New blurring and mild swelling of the left caudate head. New small area of posterior left frontal gray-white differentiation loss, involving the precentral gyrus. There have been numerous previous cortically based infarcts that are more well-defined and chronic appearing/stable. Multiple  small bilateral chronic appearing cerebellar infarcts. No hemorrhage or hydrocephalus. Vascular: Arterial calcification. Skull: No acute or aggressive finding. Sinuses/Orbits: Negative IMPRESSION: 1. Small acute infarct in the posterior left frontal lobe, involving the precentral gyrus. 2. Acute infarct involving the left caudate head. 3. Multiple remote appearing small infarcts in the bilateral cerebrum and cerebellum. Electronically Signed   By: Monte Fantasia M.D.   On: 03/02/2017 12:58    Assessment: 56 y.o. 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  1. CT head obtained in the ED reveals a medium sized subacute infarct in the posterior left frontal lobe, involving the precentral gyrus. There is also a subacute infarct involving the left caudate head.  2. 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 Corresponding finding of right homonymous hemianopsia is noted.  3. Additional findings on CT of multiple small remote-appearing infarcts in the bilateral cerebrum and cerebellum. 4. Of note, she states that she is noncompliant with ASA.  5. Chronic low back pain.  6. Stroke Risk Factors - Smoking and evidence for prior strokes on CT  Plan: 1. HgbA1c, fasting lipid panel 2. MRI, MRA of the brain without contrast 3. PT consult, OT consult, Speech consult 4. Echocardiogram 5. Carotid dopplers 6. Restart ASA. 7. Increase atorvastatin to 40 mg po qd. Obtain CK level.  8. Telemetry monitoring 9. Frequent neuro checks   '@Electronically'$  signed: Dr. Kerney Elbe  03/02/2017, 1:32 PM
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
## 2017-03-02 NOTE — ED Triage Notes (Signed)
Pt reports right side numbness and weakness x 3 days. Grip is weaker on right side and facial droop noted.

## 2017-03-02 NOTE — Progress Notes (Signed)
Office Visit Note   Patient: Kelsey Wade           Date of Birth: 25-Oct-1960           MRN: [id number] Visit Date: 03/02/2017              Requested by: Dorena Dew, FNP [address], [place] [id number] PCP: Dorena Dew, FNP   Assessment & Plan: Visit Diagnoses:  1. Acute right-sided weakness     Plan: Patient likely had a left hemispheric CVA with motor involvement and weakness are right side. We called emergency room and she will head there now for evaluation. Follow-up here will be when necessary. She was able to ambulate out using the rolling walker.  Follow-Up Instructions: Return if symptoms worsen or fail to improve.   Orders:  No orders of the defined types were placed in this encounter.  No orders of the defined types were placed in this encounter.     Procedures: No procedures performed   Clinical Data: No additional findings.   Subjective: Chief Complaint  Patient presents with  . Neck - Pain  . Right Arm - Numbness  . Lower Back - Pain  . Right Leg - Numbness    HPI 56 year old female smoker with recent visit to the ER 02/10/2017 for right side pain for a week had MRI C-spine and T-spine and L-spine. This showed no significant compressive lesions and she has some lab work including troponin and the met which was normal with the exception of glucose of 104. She is here with a walker which she is used for the last 24 hours and states that 3 days ago she woke up with right arm weakness right leg weakness and inability to use her right hand with right leg weakness. She has some difficulty with speech. She's had some numbness on the right side of her face. She states her right leg doesn't work well. She saw Cammie Sickle FNP who reviewed her images which included some mild degenerative changes with bulges C6-7 and some wedging at C7 vertebral body as well as remote pontine lacunar infarct. Some wedging was noted in the thoracic  spine. She was placed on Robaxin, Neurontin and a Toradol injection was performed.  Review of Systems 14 point review of systems is. obtained and reviewed from 02/17/2017 primary care visit and ER provider 02/10/2017 Dr. Verlene Mayer and is unchanged other than as mentioned in history of present illness.   Objective: Vital Signs: BP 129/83   Pulse 74   Ht '5\' 4"'$  (1.626 m)   Wt 135 lb (61.2 kg)   BMI 23.17 kg/m   Physical Exam  Constitutional: She is oriented to person, place, and time. She appears well-developed.  HENT:  Head: Normocephalic.  Right Ear: External ear normal.  Left Ear: External ear normal.  Eyes: Pupils are equal, round, and reactive to light.  Neck: No tracheal deviation present. No thyromegaly present.  Cardiovascular: Normal rate.   Pulmonary/Chest: Effort normal.  Abdominal: Soft.  Musculoskeletal:  Patient is not able to grip with the right hand she has slight swelling of the fingers. Upper extremity reflexes are 3+ right and left arm biceps triceps brachioradialis with 2+ patellar and ankle jerk. She is unable to flex her biceps and with rapid flexion and extension of her elbow she has increased tone with resistance to motion. She has sensation in her hand. Right lower extremity weakness with similar findings. Left upper extremity  left lower extremity normal.  Neurological: She is alert and oriented to person, place, and time.  Skin: Skin is warm and dry.  Psychiatric: She has a normal mood and affect. Her behavior is normal.    Ortho Exam  Specialty Comments:  No specialty comments available.  Imaging: No results found.   PMFS History: Patient Active Problem List   Diagnosis Date Noted  . Hyperlipidemia LDL goal <100 11/15/2016  . Essential hypertension 11/12/2016  . Neuropathy 11/12/2016  . Tobacco dependence 11/12/2016   Past Medical History:  Diagnosis Date  . Chronic back pain     No family history on file.  Past Surgical History:  Procedure  Laterality Date  . APPENDECTOMY     Social History   Occupational History  . Not on file.   Social History Main Topics  . Smoking status: Current Every Day Smoker    Packs/day: 0.50    Types: Cigarettes  . Smokeless tobacco: Current User  . Alcohol use Yes     Comment: Occasionally   . Drug use: Yes    Types: Marijuana     Comment: daily   . Sexual activity: Not on file

## 2017-03-02 NOTE — ED Notes (Signed)
PT remains in MRI.

## 2017-03-02 NOTE — ED Notes (Addendum)
Attempted report x1. PT in MRI at this time.

## 2017-03-02 NOTE — ED Notes (Signed)
Benjamine Mola, RN accepts report at this time.

## 2017-03-02 NOTE — ED Notes (Signed)
Attempted report x 3.  

## 2017-03-02 NOTE — ED Notes (Signed)
Neurologist at bedside. 

## 2017-03-02 NOTE — ED Notes (Signed)
Attempted report x 2 

## 2017-03-02 NOTE — H&P (Signed)
History and Physical    Kalle Bernath QMV:[id number] DOB: [birth year] DOA: 03/02/2017  PCP: Dorena Dew, FNP Patient coming from: home  Chief Complaint: right sided weakness  HPI: Kelsey Wade is a 56 y.o. female with medical history significant for ETOH, tobacco use chronic back pain presents to the emergency department with the chief complaint right-sided weakness numbness and tingling. Initial evaluation includes CT of the head shows an acute infarct.  Information is obtained from the chart and the patient and her husband is at the bedside. Patient has had chronic back pain for several months. She has seen orthopedic surgery in the past. On May 24th she had MRI of her C-spine lumbar and thoracic spines that revealed mild degenerative changes with bulges at C6-7 similar to his C7 vertebral body as well as remote pontine lacunar infarct. She was provided with Robaxin Neurontin added Toradol injection Over the last 3 days she experienced worsening right-sided weakness and pain. She reports that progressed to the point she had to use a walker. She went to her orthopedic surgeon who noted acute right-sided weakness and opined likely left hemispheric CVA recommended emergency room workup. She denies headache visual disturbances dizziness syncope or near-syncope. She denies chest pain palpitation shortness of breath fever chills cough lower extremity edema. She denies abdominal pain nausea vomiting diarrhea constipation melena bright red blood per rectum. She denies dysuria hematuria frequency or urgency. She denies fever chills unintentional weight loss.  ED Course: Emergency department she's afebrile hemodynamically stable and not hypoxic.  Review of Systems: As per HPI otherwise all other systems reviewed and are negative.   Ambulatory Status: Ambulates independently is independent with ADLs lives with her husband  Past Medical History:  Diagnosis Date  . Chronic back pain      Past Surgical History:  Procedure Laterality Date  . APPENDECTOMY      Social History   Social History  . Marital status: Divorced    Spouse name: N/A  . Number of children: N/A  . Years of education: N/A   Occupational History  . Not on file.   Social History Main Topics  . Smoking status: Current Every Day Smoker    Packs/day: 0.50    Types: Cigarettes  . Smokeless tobacco: Current User  . Alcohol use Yes     Comment: Occasionally   . Drug use: Yes    Types: Marijuana     Comment: daily   . Sexual activity: Not on file   Other Topics Concern  . Not on file   Social History Narrative  . No narrative on file   She lives at home with her husband she's currently on disability. Fairly independent with ADLs No Known Allergies  Family History  Problem Relation Age of Onset  . Alcohol abuse Father   . Alcohol abuse Brother     Prior to Admission medications   Medication Sig Start Date End Date Taking? Authorizing Provider  amLODipine (NORVASC) 10 MG tablet Take 1 tablet (10 mg total) by mouth daily. 11/29/16  Yes Dorena Dew, FNP  aspirin EC 81 MG tablet Take 1 tablet (81 mg total) by mouth daily. 11/15/16  Yes Dorena Dew, FNP  atorvastatin (LIPITOR) 20 MG tablet Take 1 tablet (20 mg total) by mouth daily. 11/15/16  Yes Dorena Dew, FNP  busPIRone (BUSPAR) 10 MG tablet Take 1 tablet (10 mg total) by mouth 2 (two) times daily. 02/17/17  Yes Dorena Dew, FNP  DULoxetine (CYMBALTA)  30 MG capsule Take 30 mg by mouth daily.  12/27/16  Yes [provider]  gabapentin (NEURONTIN) 300 MG capsule Take 1 capsule (300 mg total) by mouth 4 (four) times daily. 02/17/17 03/19/17 Yes Dorena Dew, FNP  omeprazole (PRILOSEC) 20 MG capsule Take 1 capsule (20 mg total) by mouth daily. 11/12/16  Yes Dorena Dew, FNP  methocarbamol (ROBAXIN) 500 MG tablet Take 1 tablet (500 mg total) by mouth every 8 (eight) hours as needed for muscle  spasms. Patient not taking: Reported on 03/02/2017 02/17/17   Dorena Dew, FNP    Physical Exam: Vitals:   03/02/17 1500 03/02/17 1545 03/02/17 1600 03/02/17 1615  BP: (!) 129/98 131/80 111/65 114/76  Pulse: 84  70 66  Resp: 20 18 17 19   Temp:      TempSrc:      SpO2: 95% 95% 96% 98%     General:  Appears calm and comfortable sitting up in bed in no acute distress Eyes:  PERRL, EOMI, normal lids, iris ENT:  grossly normal hearing, lips & tongue, mucous membranes of her mouth are pink but dry Neck:  no LAD, masses or thyromegaly Cardiovascular:  RRR, no m/r/g. No LE edema.  Respiratory:  CTA bilaterally, no w/r/r. Normal respiratory effort. Abdomen:  soft, ntnd, positive bowel sounds no guarding or rebounding Skin:  no rash or induration seen on limited exam Musculoskeletal:  grossly normal tone BUE/BLE, good ROM, no bony abnormality Psychiatric:  grossly normal mood and affect, speech fluent and appropriate, AOx3 Neurologic:  CN 2-12 grossly intact, moves all extremities in coordinated fashion, sensation intact. Alert and oriented speech slow but clear right grip absent. Unable to lift right arm off the bed. Left grip 5 out of 5. Left lower extremity strength 5 out of 5. Left leg strength 3 out of 5 sensation intact tongue midline  Labs on Admission: I have personally reviewed following labs and imaging studies  CBC:  Recent Labs Lab 03/02/17 1159  WBC 8.7  NEUTROABS 6.0  HGB 15.7*  HCT 47.7*  MCV 92.1  PLT 124   Basic Metabolic Panel:  Recent Labs Lab 03/02/17 1159  NA 138  K 4.2  CL 105  CO2 25  GLUCOSE 133*  BUN 9  CREATININE 0.73  CALCIUM 9.3   GFR: Estimated Creatinine Clearance: 69.4 mL/min (by C-G formula based on SCr of 0.73 mg/dL). Liver Function Tests:  Recent Labs Lab 03/02/17 1159  AST 25  ALT 27  ALKPHOS 74  BILITOT 0.6  PROT 7.2  ALBUMIN 4.0   No results for input(s): LIPASE, AMYLASE in the last 168 hours. No results for  input(s): AMMONIA in the last 168 hours. Coagulation Profile:  Recent Labs Lab 03/02/17 1159  INR 0.98   Cardiac Enzymes: No results for input(s): CKTOTAL, CKMB, CKMBINDEX, TROPONINI in the last 168 hours. BNP (last 3 results) No results for input(s): PROBNP in the last 8760 hours. HbA1C: No results for input(s): HGBA1C in the last 72 hours. CBG: No results for input(s): GLUCAP in the last 168 hours. Lipid Profile: No results for input(s): CHOL, HDL, LDLCALC, TRIG, CHOLHDL, LDLDIRECT in the last 72 hours. Thyroid Function Tests: No results for input(s): TSH, T4TOTAL, FREET4, T3FREE, THYROIDAB in the last 72 hours. Anemia Panel: No results for input(s): VITAMINB12, FOLATE, FERRITIN, TIBC, IRON, RETICCTPCT in the last 72 hours. Urine analysis:    Component Value Date/Time   LABSPEC <=1.005 01/18/2017 1211   PHURINE 6.0 01/18/2017 1211  GLUCOSEU NEGATIVE 01/18/2017 1211   HGBUR SMALL (A) 01/18/2017 1211   BILIRUBINUR NEGATIVE 01/18/2017 1211   KETONESUR NEGATIVE 01/18/2017 1211   PROTEINUR NEGATIVE 01/18/2017 1211   UROBILINOGEN 0.2 01/18/2017 1211   NITRITE NEGATIVE 01/18/2017 1211   LEUKOCYTESUR NEGATIVE 01/18/2017 1211    Creatinine Clearance: Estimated Creatinine Clearance: 69.4 mL/min (by C-G formula based on SCr of 0.73 mg/dL).  Sepsis Labs: @LABRCNTIP (procalcitonin:4,lacticidven:4) )No results found for this or any previous visit (from the past 240 hour(s)).   Radiological Exams on Admission: Ct Head Wo Contrast  Result Date: 03/02/2017 CLINICAL DATA:  Right-sided extremity weakness and facial droop for 3 days. Mild diffuse headache. EXAM: CT HEAD WITHOUT CONTRAST TECHNIQUE: Contiguous axial images were obtained from the base of the skull through the vertex without intravenous contrast. COMPARISON:  02/10/2017 FINDINGS: Brain: New blurring and mild swelling of the left caudate head. New small area of posterior left frontal gray-white differentiation loss, involving  the precentral gyrus. There have been numerous previous cortically based infarcts that are more well-defined and chronic appearing/stable. Multiple small bilateral chronic appearing cerebellar infarcts. No hemorrhage or hydrocephalus. Vascular: Arterial calcification. Skull: No acute or aggressive finding. Sinuses/Orbits: Negative IMPRESSION: 1. Small acute infarct in the posterior left frontal lobe, involving the precentral gyrus. 2. Acute infarct involving the left caudate head. 3. Multiple remote appearing small infarcts in the bilateral cerebrum and cerebellum. Electronically Signed   By: Monte Fantasia M.D.   On: 03/02/2017 12:58    EKG: Independently reviewed. Normal sinus rhythm Rightward axis Anterior infarct , age undetermined Abnormal ECG  Assessment/Plan Principal Problem:   CVA (cerebrovascular accident) [facility]) Active Problems:   Essential hypertension   Tobacco dependence   Hyperlipidemia LDL goal <100   Chronic pain   ETOH abuse   #1. CVA. Risk factors include hypertension tobacco use daily medical history. CT of the head reveals medium-sized subacute infarct in the posterior left frontal lobe. Also subacute infarct involving left caudate head. Old left upper lobe ischemic infarction noted. Multiple small remote appearing infarcts bilateral cerebellum. She is evaluated by neuro in the emergency department recommend medicine admission further workup -Admit to telemetry -Obtain MRI/MRA of the brain -Carotid Doppler -2-D echo -Hemoglobin A1c, lipid panel -Continue aspirin, increase statin per neuro recommendation -Obtain a CK level -Frequent neuro checks -She passed a bedside swallow eval so we will provide her with a heart healthy diet -Physical therapy, occupational therapy  #2. Hypertension. Fair control in the emergency department. I medications include Norvasc -Hold this for now -Monitor -Resume as indicated  #3. Tobacco use -Cessation counseling offered  #4. EtOH  abuse. She reports she drinks 3-4 beers daily no signs symptoms of withdrawal -Obtain a urine drug screen -Monitor for withdrawals    DVT prophylaxis: lovenox  Code Status: full  Family Communication: husband at bedside  Disposition Plan: home  Consults called: neuro dr Cheral Marker Admission status: inpatient   Radene Gunning MD [facility]  If 7PM-7AM, please contact night-coverage [email or web address] Password [facility]  03/02/2017, 4:53 PM

## 2017-03-02 NOTE — ED Notes (Signed)
PT still in radiology Department. PT will be transported to floor on arrival back to ED

## 2017-03-03 ENCOUNTER — Inpatient Hospital Stay (HOSPITAL_COMMUNITY): Payer: Medicaid Other

## 2017-03-03 ENCOUNTER — Ambulatory Visit (INDEPENDENT_AMBULATORY_CARE_PROVIDER_SITE_OTHER): Payer: Self-pay | Admitting: Physical Medicine and Rehabilitation

## 2017-03-03 DIAGNOSIS — I360 Nonrheumatic tricuspid (valve) stenosis: Secondary | ICD-10-CM

## 2017-03-03 DIAGNOSIS — E785 Hyperlipidemia, unspecified: Secondary | ICD-10-CM

## 2017-03-03 DIAGNOSIS — I638 Other cerebral infarction: Secondary | ICD-10-CM

## 2017-03-03 DIAGNOSIS — I10 Essential (primary) hypertension: Secondary | ICD-10-CM

## 2017-03-03 DIAGNOSIS — F17200 Nicotine dependence, unspecified, uncomplicated: Secondary | ICD-10-CM

## 2017-03-03 DIAGNOSIS — F1010 Alcohol abuse, uncomplicated: Secondary | ICD-10-CM

## 2017-03-03 DIAGNOSIS — I639 Cerebral infarction, unspecified: Secondary | ICD-10-CM

## 2017-03-03 LAB — LIPID PANEL
CHOL/HDL RATIO: 3.2 ratio
CHOLESTEROL: 146 mg/dL (ref 0–200)
HDL: 46 mg/dL (ref >40)
LDL Cholesterol: 79 mg/dL (ref 0–99)
TRIGLYCERIDES: 105 mg/dL (ref <150)
VLDL: 21 mg/dL (ref 0–40)

## 2017-03-03 LAB — ECHOCARDIOGRAM COMPLETE
AV Area mean vel: 2.46 cm2
AV Mean grad: 8 mmHg
AV area mean vel ind: 1.47 cm2/m2
AV peak Index: 0.95
AV vel: 2.02
AVA: 2.02 cm2
AVAREAVTI: 1.58 cm2
AVAREAVTIIND: 1.21 cm2/m2
AVCELMEANRAT: 0.78
AVLVOTPG: 7 mmHg
AVPG: 28 mmHg
AVPKVEL: 263 cm/s
Ao pk vel: 0.5 m/s
CHL CUP AV VALUE AREA INDEX: 1.21
CHL CUP MV DEC (S): 217
CHL CUP STROKE VOLUME: 57 mL
DOP CAL AO MEAN VELOCITY: 124 cm/s
E/e' ratio: 19.97
EWDT: 217 ms
FS: 33 % (ref 28–44)
Height: 64 in
IVS/LV PW RATIO, ED: 1.19
LA ID, A-P, ES: 40 mm
LA diam index: 2.4 cm/m2
LA vol A4C: 55.7 ml
LAVOL: 58.6 mL
LAVOLIN: 35.1 mL/m2
LDCA: 3.14 cm2
LEFT ATRIUM END SYS DIAM: 40 mm
LV E/e' medial: 19.97
LV PW d: 12.1 mm — AB (ref 0.6–1.1)
LV TDI E'LATERAL: 5.91
LV dias vol index: 51 mL/m2
LV dias vol: 86 mL (ref 46–106)
LV e' LATERAL: 5.91 cm/s
LV sys vol index: 17 mL/m2
LV sys vol: 29 mL (ref 14–42)
LVEEAVG: 19.97
LVOT SV: 77 mL
LVOT VTI: 24.6 cm
LVOT peak VTI: 0.64 cm
LVOT peak vel: 132 cm/s
LVOTD: 20 mm
Lateral S' vel: 13.5 cm/s
MV pk A vel: 101 m/s
MVPG: 6 mmHg
MVPKEVEL: 118 m/s
Simpson's disk: 66
TAPSE: 23.5 mm
TDI e' medial: 5
VTI: 38.2 cm
Weight: 2160 oz

## 2017-03-03 LAB — RAPID URINE DRUG SCREEN, HOSP PERFORMED
Amphetamines: NOT DETECTED
Barbiturates: NOT DETECTED
Benzodiazepines: NOT DETECTED
Cocaine: NOT DETECTED
OPIATES: NOT DETECTED
Tetrahydrocannabinol: POSITIVE — AB

## 2017-03-03 MED ORDER — AMLODIPINE BESYLATE 10 MG PO TABS
10.0000 mg | ORAL_TABLET | Freq: Every day | ORAL | Status: DC
  Administered 2017-03-04: 10 mg via ORAL
  Filled 2017-03-03: qty 1

## 2017-03-03 NOTE — Evaluation (Signed)
Speech Language Pathology Evaluation Patient Details Name: Darlin Stenseth MRN: [id number] DOB: March 19, 1961 Today's Date: 03/03/2017 Time: 0737-1062 SLP Time Calculation (min) (ACUTE ONLY): 21 min  Problem List:  Patient Active Problem List   Diagnosis Date Noted  . CVA (cerebrovascular accident) ([place]) 03/02/2017  . Chronic pain 03/02/2017  . ETOH abuse 03/02/2017  . Ischemic stroke ([place])   . Hyperlipidemia LDL goal <100 11/15/2016  . Essential hypertension 11/12/2016  . Neuropathy 11/12/2016  . Tobacco dependence 11/12/2016   Past Medical History:  Past Medical History:  Diagnosis Date  . Chronic back pain   . ETOH abuse   . Hyperlipidemia   . Stroke ([place])   . Tobacco abuse    Past Surgical History:  Past Surgical History:  Procedure Laterality Date  . APPENDECTOMY     HPI:  56 year old female admitted with right sided weakness and tingling. CT of the head reveals medium-sized subacute infarct in the posterior left frontal lobe.    Assessment / Plan / Recommendation Clinical Impression  Patient presents with  mild dysarthria without impact on overall intelligibility. Additionally patient with impaired sustained attention impacting storage of new information and ability to follow complex commands, complete high level reasoning tasks. Cannot r/o degree of receptive aphasia impacting auditory comprehension given location of infarcts however likely impaired due to cognitive deficits. Patient will benefit from OP SLP f/u after d/c. Will continue to f/u.     SLP Assessment  SLP Recommendation/Assessment: Patient needs continued Speech Lanaguage Pathology Services SLP Visit Diagnosis: Cognitive communication deficit (R41.841)    Follow Up Recommendations  Outpatient SLP    Frequency and Duration min 2x/week  2 weeks      SLP Evaluation Cognition  Overall Cognitive Status: Within Functional Limits for tasks assessed Arousal/Alertness: Awake/alert Orientation Level:  Oriented X4 Attention: Sustained Sustained Attention: Impaired Sustained Attention Impairment: Functional complex;Verbal complex Memory: Impaired Memory Impairment: Storage deficit;Retrieval deficit Awareness: Impaired Awareness Impairment: Emergent impairment;Intellectual impairment Problem Solving: Appears intact Executive Function: Reasoning Reasoning: Impaired Reasoning Impairment: Functional complex Research officer, trade union) Safety/Judgment: Appears intact       Comprehension  Auditory Comprehension Overall Auditory Comprehension: Impaired Yes/No Questions: Impaired Complex Questions: 75-100% accurate Commands: Impaired Multistep Basic Commands: 50-74% accurate Conversation: Simple Interfering Components: Attention Visual Recognition/Discrimination Discrimination: Within Function Limits Reading Comprehension Reading Status: Not tested    Expression Expression Primary Mode of Expression: Verbal Verbal Expression Overall Verbal Expression: Appears within functional limits for tasks assessed Written Expression Dominant Hand: Right   Oral / Motor  Motor Speech Overall Motor Speech: Impaired Respiration: Within functional limits Phonation: Normal Resonance: Within functional limits Articulation: Impaired Level of Impairment: Sentence Intelligibility: Intelligible Motor Planning: Witnin functional limits Motor Speech Errors: Not applicable   GO            Gabriel Rainwater MA, CCC-SLP [phone]          Vaishnavi Dalby Meryl 03/03/2017, 3:31 PM

## 2017-03-03 NOTE — Progress Notes (Signed)
STROKE TEAM PROGRESS NOTE   Patient was not administered IV t-PA secondary to being outside of the treatment window. She was admitted to General Neurology for further evaluation and treatment.   SUBJECTIVE (INTERVAL HISTORY) No new events overnight   OBJECTIVE Temp:  [97.9 F (36.6 C)-99.2 F (37.3 C)] 98.4 F (36.9 C) (06/14 1400) Pulse Rate:  [66-84] 81 (06/14 1400) Cardiac Rhythm: Ventricular tachycardia (06/14 1338) Resp:  [12-24] 18 (06/14 1400) BP: (111-166)/(65-98) 156/91 (06/14 1400) SpO2:  [95 %-99 %] 96 % (06/14 1400)  CBC:  Recent Labs Lab 03/02/17 1159  WBC 8.7  NEUTROABS 6.0  HGB 15.7*  HCT 47.7*  MCV 92.1  PLT 295    Basic Metabolic Panel:  Recent Labs Lab 03/02/17 1159  NA 138  K 4.2  CL 105  CO2 25  GLUCOSE 133*  BUN 9  CREATININE 0.73  CALCIUM 9.3    Lipid Panel:    Component Value Date/Time   CHOL 146 03/03/2017 0351   TRIG 105 03/03/2017 0351   HDL 46 03/03/2017 0351   CHOLHDL 3.2 03/03/2017 0351   VLDL 21 03/03/2017 0351   LDLCALC 79 03/03/2017 0351   HgbA1c: No results found for: HGBA1C Urine Drug Screen:    Component Value Date/Time   LABOPIA NONE DETECTED 03/02/2017 0119   COCAINSCRNUR NONE DETECTED 03/02/2017 0119   LABBENZ NONE DETECTED 03/02/2017 0119   AMPHETMU NONE DETECTED 03/02/2017 0119   THCU POSITIVE (A) 03/02/2017 0119   LABBARB NONE DETECTED 03/02/2017 0119    Alcohol Level No results found for: [facility]  IMAGING Dg Chest 1 View 03/02/2017 CLINICAL DATA:  CVA. EXAM: CHEST 1 VIEW COMPARISON:  02/10/2017 chest radiograph FINDINGS: IMPRESSION: No active disease.  Ct Head Wo Contrast 03/02/2017 IMPRESSION: 1. Small acute infarct in the posterior left frontal lobe, involving the precentral gyrus. 2. Acute infarct involving the left caudate head. 3. Multiple remote appearing small infarcts in the bilateral cerebrum and cerebellum.    MRI Head Without Contrast MRA Head Without Contrast 03/02/2017 IMPRESSION: 1.  Occlusion of the left internal carotid artery including the visualized cervical segment, with minimal enhancement in the carotid terminus due to collateral flow from the complete circle of Willis. 2. Multifocal acute left-sided infarct involving the left frontal, parietal and temporal lobes. The largest area of ischemia is within the left precentral gyrus. 3. Gyral edema without significant mass effect. No acute hemorrhage. 4. 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  2D Echo 03/03/2017 Study Conclusions - Left ventricle: The cavity size was normal. There was mild focal basal hypertrophy of the septum. Systolic function was normal.  The estimated ejection fraction was in the range of 60% to 65%.  Doppler parameters are consistent with both elevated ventricular end-diastolic filling pressure and elevated left atrial filling   pressure.  - Aortic valve: Valve area (VTI): 2.02 cm^2. Valve area (Vmax): 1.58 cm^2. Valve area (Vmean): 2.46 cm^2. - Left atrium: The atrium was mildly dilated. - Atrial septum: No defect or patent foramen ovale was identified.   PHYSICAL EXAM PHYSICAL EXAM Physical exam: Exam: Gen: NAD Eyes: anicteric sclerae, moist conjunctivae                    CV: no MRG, no carotid bruits, no peripheral edema Mental Status: Alert, follows commands, good historian  Neuro: Detailed Neurologic Exam  Speech:    No aphasia, no dysarthria  Cranial Nerves:    The pupils are equal, round, and reactive to light.Marland Kitchen  Attempted, Fundi not visualized.  EOMI. No gaze preference. Visual fields full. Face symmetric, Tongue midline. Hearing intact to voice. Shoulder shrug intact  Motor Observation:    no involuntary movements noted. Tone appears normal.     Strength:    Right hemiparesis  Plantars downgoing.   ASSESSMENT/PLAN Ms. Candise Crabtree is a 56 y.o. female with history of stroke, chronic back pain, ETOH abuse, hyperlipidemia, and tobacco abuse  presenting with new onset right upper and lower extremity weakness with sensory numbness and speech difficulty for the preceding three days. She did not receive IV t-PA due to arriving outside of the treatment window.  Stroke: subacute left precentral gyrus infarct in setting of old infarcts of left caudate head, left occipital lobe, and multiple remote infarcts of the cerebrum and cerebellum secondary to emboli possibly due to ICA stenosis.  CT head: Small acute infarct of left precentral gyrus and left caudate head.  Multiple remote small infarcts in the bilateral cerebrum and cerebellum.    MRI head: Multifocal acute left-sided infarct involving the left frontal, parietal and temporal lobes.  Gyral edema without significant mass effect. Multiple old cerebral and cerebellar infarcts with chronic microvascular ischemia.  MRA head: Occlusion of the left ICA, with collateral flow from the circle of Willis.  Carotid Doppler  Possible left ICA occlusion, will order CTA of the head and neck to further evaluate.  2D Echo no pfo no thrombus  LDL 79  HgbA1c pending  Lovenox 40 mg sq daily for VTE prophylaxis  Diet Heart Room service appropriate? Yes; Fluid consistency: Thin  aspirin 81 mg daily prior to admission, now on aspirin 325 mg daily. Recommend discharge on Plavix.  Patient counseled to be compliant with her antithrombotic medications  Ongoing aggressive stroke risk factor management  Therapy recommendations: pending  Disposition: pending  Hypertension  Stable Permissive hypertension (OK if < 220/120) but gradually normalize in 5-7 days Long-term BP goal normotensive  Hyperlipidemia  Home meds: none  LDL 79, goal < 70  Added atorvastatin 40 mg PO daily  Continue statin at discharge  Diabetes  HgbA1c pending, goal < 7.0  Controlled  Other Stroke Risk Factors  Cigarette smoker advised to stop smoking  ETOH use, advised to drink no more than 1 drink a  day  Other Active Problems  none  Hospital day # 1  Personally examined patient and images, and have participated in and made any corrections needed to history, physical, neuro exam,assessment and plan as stated above.  I have personally obtained the history, evaluated lab date, reviewed imaging studies and agree with radiology interpretations.    Sarina Ill, MD Stroke Neurology  To contact Stroke Continuity provider, please refer to [email or web address] After hours, contact General Neurology

## 2017-03-03 NOTE — Evaluation (Signed)
Occupational Therapy Evaluation Patient Details Name: Kelsey Wade MRN: [id number] DOB: 1961/05/07 Today's Date: 03/03/2017    History of Present Illness Pt is a 56 y/o female admitted from home secondary to R sided weakness. 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 PMH including but not limited to HTN.   Clinical Impression   PTA Pt independent in ADL and mobility (using a RW for 3 days PTA). Pt currently supervision for mobility and mod A for ADL requiring BUE. Pt also presenting with right homonymous hemianopsia. Pt will require skilled OT in the acute setting to maximize safety and independence in ADL and to provide education in compensatory strategies for hemiplegia and visual deficits. Pt will require outpatient OT (prefer neuro outpatient) for continued progress/education, to get as much functioning in right (dominant) UE. Next session to focus on visual strategies and bilateral hand tasks at sink.    Follow Up Recommendations  Outpatient OT;Supervision - Intermittent    Equipment Recommendations  None recommended by OT (Sling for RUE)    Recommendations for Other Services       Precautions / Restrictions Precautions Precautions: Fall Restrictions Weight Bearing Restrictions: No      Mobility Bed Mobility Overal bed mobility: Independent                Transfers Overall transfer level: Modified independent Equipment used: None                  Balance Overall balance assessment: Needs assistance Sitting-balance support: Feet supported Sitting balance-Leahy Scale: Normal     Standing balance support: During functional activity;No upper extremity supported Standing balance-Leahy Scale: Good                   Standardized Balance Assessment Standardized Balance Assessment : Dynamic Gait Index   Dynamic Gait Index Level Surface: Normal Change in Gait Speed: Normal Gait with Horizontal Head Turns:  Mild Impairment Gait with Vertical Head Turns: Mild Impairment Gait and Pivot Turn: Mild Impairment Step Over Obstacle: Normal Step Around Obstacles: Normal Steps: Normal Total Score: 21     ADL either performed or assessed with clinical judgement   ADL Overall ADL's : Needs assistance/impaired Eating/Feeding: Moderate assistance Eating/Feeding Details (indicate cue type and reason): for tasks requiring bilateral hand coordination Grooming: Moderate assistance;Standing;Wash/dry hands;Cueing for safety Grooming Details (indicate cue type and reason): no LOB at sink, vc to wash RUE Upper Body Bathing: Min guard;Sitting   Lower Body Bathing: Min guard;Sit to/from stand   Upper Body Dressing : Minimal assistance;Sitting Upper Body Dressing Details (indicate cue type and reason): educated on dressing RUE first Lower Body Dressing: Minimal assistance;Sit to/from stand   Toilet Transfer: Supervision/safety;Ambulation;Regular Toilet;Grab bars Toilet Transfer Details (indicate cue type and reason): in bathroom Toileting- Clothing Manipulation and Hygiene: Modified independent;Sit to/from stand Toileting - Clothing Manipulation Details (indicate cue type and reason): hospital gown and peri care Tub/ Shower Transfer: Tub transfer;Supervision/safety;Ambulation;Shower Scientist, research (medical) Details (indicate cue type and reason): Pt has shower chair she can use Functional mobility during ADLs: Supervision/safety General ADL Comments: RUE inattention noted during ADL - Pt needed vc to use LUE to assist RUE in grooming tasks     Vision Baseline Vision/History: Wears glasses Wears Glasses: At all times Patient Visual Report: Other (comment) (right homonymous hemianopsia ) Vision Assessment?: Vision impaired- to be further tested in functional context (right homonymous hemianopsia ) Additional Comments: No visual assessment this session due to MD entering  room to discuss results of testing      Perception     Praxis      Pertinent Vitals/Pain Pain Assessment: Faces Faces Pain Scale: Hurts little more Pain Location: chronic back pain Pain Descriptors / Indicators: Sore Pain Intervention(s): Monitored during session;Repositioned     Hand Dominance Right   Extremity/Trunk Assessment Upper Extremity Assessment Upper Extremity Assessment: RUE deficits/detail RUE Deficits / Details: 3/5 deltoid. Shoulder shrug present, 0/5 triceps, 0/5 biceps, 0/5 wrist flex/extend and 0/5 finger flexion/extension. edema noted (education for elevation provided) with hand being hot and sweaty RUE Sensation:  (WFL) RUE Coordination: decreased fine motor;decreased gross motor   Lower Extremity Assessment Lower Extremity Assessment: Defer to PT evaluation   Cervical / Trunk Assessment Cervical / Trunk Assessment: Other exceptions Cervical / Trunk Exceptions: history of Chronic back pain   Communication Communication Communication: No difficulties   Cognition Arousal/Alertness: Awake/alert Behavior During Therapy: WFL for tasks assessed/performed Overall Cognitive Status: Within Functional Limits for tasks assessed                                     General Comments  visual field deficit did not impact room navigation    Exercises     Shoulder Instructions      Home Living Family/patient expects to be discharged to:: Private residence Living Arrangements: Spouse/significant other Available Help at Discharge: Family;Available 24 hours/day Type of Home: House Home Access: Stairs to enter [employer] of Steps: 2 Entrance Stairs-Rails: None Home Layout: One level     Bathroom Shower/Tub: Teacher, early years/pre: Standard Bathroom Accessibility: Yes How Accessible: Accessible via walker Home Equipment: Shower seat;Walker - 2 wheels;Cane - single point;Bedside commode;Wheelchair - manual      Lives With: Spouse;Other (Comment) (Uncle)     Prior Functioning/Environment Level of Independence: Independent with assistive device(s)        Comments: has been using a RW for the past three days        OT Problem List: Decreased strength;Decreased range of motion;Impaired vision/perception;Decreased coordination;Decreased knowledge of precautions;Impaired UE functional use      OT Treatment/Interventions: Self-care/ADL training;Therapeutic exercise;Neuromuscular education;Therapeutic activities;Visual/perceptual remediation/compensation;Patient/family education    OT Goals(Current goals can be found in the care plan section) Acute Rehab OT Goals Patient Stated Goal: return home OT Goal Formulation: With patient Time For Goal Achievement: 03/17/17 Potential to Achieve Goals: Good ADL Goals Pt Will Perform Grooming: with supervision;standing (demonstrating strategies for BUE tasks) Additional ADL Goal #1: Pt will verbalize 3 strategies to compensate for visual field deficit Additional ADL Goal #2: Pt will use LUE to move RUE during functional tasks demonstrating increased awareness and safety for RUE  OT Frequency: Min 2X/week   Barriers to D/C:    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       Co-evaluation              AM-PAC PT "6 Clicks" Daily Activity     Outcome Measure Help from another person eating meals?: A Little Help from another person taking care of personal grooming?: A Lot Help from another person toileting, which includes using toliet, bedpan, or urinal?: None Help from another person bathing (including washing, rinsing, drying)?: A Little Help from another person to put on and taking off regular upper body clothing?: A Lot Help from another person to put on and taking off  regular lower body clothing?: A Little 6 Click Score: 17   End of Session Equipment Utilized During Treatment: Gait belt Nurse Communication: Mobility status  Activity Tolerance: Patient tolerated  treatment well Patient left: in bed;with call bell/phone within reach;with bed alarm set;Other (comment) (talking to MD)  OT Visit Diagnosis: Other symptoms and signs involving the nervous system (R29.898);Hemiplegia and hemiparesis Hemiplegia - Right/Left: Right Hemiplegia - dominant/non-dominant: Dominant Hemiplegia - caused by: Cerebral infarction                Time: 1550-1606 OT Time Calculation (min): 16 min Charges:  OT General Charges $OT Visit: 1 Procedure OT Evaluation $OT Eval Moderate Complexity: 1 Procedure G-Codes:     Hulda Humphrey OTR/L [place] 03/03/2017, 5:38 PM

## 2017-03-03 NOTE — Progress Notes (Signed)
  Echocardiogram 2D Echocardiogram has been performed.  Lyliana Dicenso G Karita Dralle 03/03/2017, 1:05 PM

## 2017-03-03 NOTE — Progress Notes (Addendum)
Wrong not on wrong pt. D/C note

## 2017-03-03 NOTE — Evaluation (Signed)
Physical Therapy Evaluation Patient Details Name: Kelsey Wade MRN: [id number] DOB: Sep 05, 1961 Today's Date: 03/03/2017   History of Present Illness  Pt is a 56 y/o female admitted from home secondary to R sided weakness. 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 PMH including but not limited to HTN.  Clinical Impression  Pt presented supine in bed with HOB elevated, awake and willing to participate in therapy session. Prior to admission, pt reported that she was independent with all functional mobility and ADLs. Pt tolerated ambulating in hallway with min guard, no AD. She also successfully completed stair training. Pt would continue to benefit from skilled physical therapy services at this time while admitted and after d/c to address the below listed limitations in order to improve overall safety and independence with functional mobility.     Follow Up Recommendations Outpatient PT    Equipment Recommendations  None recommended by PT    Recommendations for Other Services       Precautions / Restrictions Precautions Precautions: Fall Restrictions Weight Bearing Restrictions: No      Mobility  Bed Mobility Overal bed mobility: Independent                Transfers Overall transfer level: Modified independent Equipment used: None                Ambulation/Gait Ambulation/Gait assistance: Min guard Ambulation Distance (Feet): 200 Feet Assistive device: None Gait Pattern/deviations: Step-through pattern;Decreased stride length;Drifts right/left Gait velocity: decreased Gait velocity interpretation: Below normal speed for age/gender General Gait Details: pt with modest instability but no overt LOB or need for physical assistance, min guard for safety  Stairs Stairs: Yes Stairs assistance: Min guard Stair Management: One rail Left;No rails;Alternating pattern;Forwards Number of Stairs: 2    Wheelchair Mobility     Modified Rankin (Stroke Patients Only) Modified Rankin (Stroke Patients Only) Pre-Morbid Rankin Score: No symptoms Modified Rankin: Slight disability     Balance Overall balance assessment: Needs assistance Sitting-balance support: Feet supported Sitting balance-Leahy Scale: Normal     Standing balance support: During functional activity;No upper extremity supported Standing balance-Leahy Scale: Good                   Standardized Balance Assessment Standardized Balance Assessment : Dynamic Gait Index   Dynamic Gait Index Level Surface: Normal Change in Gait Speed: Normal Gait with Horizontal Head Turns: Mild Impairment Gait with Vertical Head Turns: Mild Impairment Gait and Pivot Turn: Mild Impairment Step Over Obstacle: Normal Step Around Obstacles: Normal Steps: Normal Total Score: 21       Pertinent Vitals/Pain Pain Assessment: Faces Faces Pain Scale: Hurts little more Pain Location: chronic back pain Pain Descriptors / Indicators: Sore Pain Intervention(s): Monitored during session;Repositioned    Home Living Family/patient expects to be discharged to:: Private residence Living Arrangements: Spouse/significant other Available Help at Discharge: Family;Available 24 hours/day Type of Home: House Home Access: Stairs to enter Entrance Stairs-Rails: None Entrance Stairs-Number of Steps: 2 Home Layout: One level Home Equipment: Clinical cytogeneticist - 2 wheels;Cane - single point;Bedside commode;Wheelchair - manual      Prior Function Level of Independence: Independent with assistive device(s)         Comments: has been using a RW for the past three days     Hand Dominance   Dominant Hand: Right    Extremity/Trunk Assessment   Upper Extremity Assessment Upper Extremity Assessment: Defer to OT evaluation    Lower Extremity  Assessment Lower Extremity Assessment: Overall WFL for tasks assessed       Communication   Communication: No  difficulties  Cognition Arousal/Alertness: Awake/alert Behavior During Therapy: WFL for tasks assessed/performed Overall Cognitive Status: Within Functional Limits for tasks assessed                                        General Comments      Exercises     Assessment/Plan    PT Assessment Patient needs continued PT services  PT Problem List Decreased strength;Decreased activity tolerance;Decreased balance;Decreased coordination;Decreased mobility;Decreased safety awareness       PT Treatment Interventions DME instruction;Functional mobility training;Therapeutic activities;Gait training;Stair training;Therapeutic exercise;Balance training;Neuromuscular re-education;Patient/family education    PT Goals (Current goals can be found in the Care Plan section)  Acute Rehab PT Goals Patient Stated Goal: return home PT Goal Formulation: With patient Time For Goal Achievement: 03/17/17 Potential to Achieve Goals: Good    Frequency Min 3X/week   Barriers to discharge        Co-evaluation               AM-PAC PT "6 Clicks" Daily Activity  Outcome Measure Difficulty turning over in bed (including adjusting bedclothes, sheets and blankets)?: None Difficulty moving from lying on back to sitting on the side of the bed? : None Difficulty sitting down on and standing up from a chair with arms (e.g., wheelchair, bedside commode, etc,.)?: None Help needed moving to and from a bed to chair (including a wheelchair)?: None Help needed walking in hospital room?: A Little Help needed climbing 3-5 steps with a railing? : A Little 6 Click Score: 22    End of Session Equipment Utilized During Treatment: Gait belt Activity Tolerance: Patient tolerated treatment well Patient left: in bed;with call bell/phone within reach;with bed alarm set Nurse Communication: Mobility status PT Visit Diagnosis: Unsteadiness on feet (R26.81);Other abnormalities of gait and mobility  (R26.89);Other symptoms and signs involving the nervous system (R29.898)    Time: 1444-1500 PT Time Calculation (min) (ACUTE ONLY): 16 min   Charges:   PT Evaluation $PT Eval Moderate Complexity: 1 Procedure     PT G Codes:        Sherie Don, PT, DPT [place] 03/03/2017, 3:42 PM

## 2017-03-03 NOTE — Progress Notes (Signed)
OT Cancellation Note  Patient Details Name: Kelsey Wade MRN: [id number] DOB: November 30, 1960   Cancelled Treatment:    Reason Eval/Treat Not Completed: Patient at procedure or test/ unavailable. OT will check back later as schedule allows for evaluation.   Merri Ray [name] 03/03/2017, 1:07 PM  Hulda Humphrey OTR/L [phone]

## 2017-03-03 NOTE — Progress Notes (Signed)
*  PRELIMINARY RESULTS* Vascular Ultrasound Carotid Duplex (Doppler) has been completed.  Preliminary findings:  The distal right internal carotid artery demonstrates a 40-59% stenosis.  Mild to moderate plaque seen in the left common carotid artery, evidence suggests the left internal carotid artery is occluded.  Right vertebral artery is patent and antegrade, the left vertebral artery appears occluded.  Everrett Coombe 03/03/2017, 2:02 PM

## 2017-03-03 NOTE — Progress Notes (Signed)
[facility] PROGRESS NOTE  Charlynn Salih DGL:[id number] DOB: [birth year] DOA: 03/02/2017 PCP: Dorena Dew, FNP  Interim summary and HPI 56 y/o female with PMH significant for tobacco abuse, HTN, HLD, alcohol use disorder, depression and anxiety; who presented with new onset right upper extremity and lower extremity weakness. Patient endorses onset of symptoms for aprox 48-72 hours prior to seek assistance and reported associated speech difficulty and numbness sensation. Found to have left gyrus ischemic stroke.  Assessment/Plan: 1-subacute left precentral gyrus infarct -appears to be associated with emboli from ICA stenosis -continue ASA for secondary prevention -carotid duplex with concerns for left ICA stenosis CTA head/neck ordered  -will continue statins, LDL 79 -A1C pending -2-D echo w/o source of emboli -patient advise to quit smoking and will continue risk others factors modifications   2-HTN -will resume home antihypertensive regimen  -permissive with HT in acute CVA setting   3-HLD -will continue statins  4-tobacco abuse -cessation counseling provided -will continue nicoderm  5-alcohol abuse -patient advise not to drink > 2-3 drinks per week.  6-depression/anxiety -continue cymbalta and buspar   7-GERD -continue protonix  Code Status: Full Family Communication: no family at bedside  Disposition Plan: to be determined. Will complete stroke work up. Continue statins and full dose aspirin for now. Will follow neurology rec's.   Consultants:  Neurology   Procedures:  See below for x-ray  2-D echo: preserved EF and no wall motion abnormalities; no source of emboli appreciated   Carotid duplex: possible left ICA occlussion  Antibiotics: None   HPI/Subjective: Patient with right upper extremity weakness and reporting some facial numbness. Denies CP, SOB, abd pain, nausea, vomting or any there complaints.  Objective: Vitals:   03/03/17  1730 03/03/17 2113  BP: (!) 164/84 (!) 174/93  Pulse: 87 87  Resp: 18 18  Temp: 98.8 F (37.1 C) 98 F (36.7 C)    Intake/Output Summary (Last 24 hours) at 03/03/17 2320 Last data filed at 03/03/17 1300  Gross per 24 hour  Intake              480 ml  Output                0 ml  Net              480 ml   There were no vitals filed for this visit.  Exam:   General: afebrile, no CP, no SOB and no palpitations. Patient reported some right facial numbness and mild dysarthria. Still with significant right upper extremity weakness.  Cardiovascular: S1 and S2, no rubs, no gallops, no murmurs  Respiratory: good air movement, no wheezing, no crackles.  Abdomen: soft, NT, ND, positive BS  Musculoskeletal: no edema or cyanosis, no joint swelling or erythema.  Neurologic exam: patient with right upper extremity weakness and slight off balance. CN grossly intact.  Data Reviewed: Basic Metabolic Panel:  Recent Labs Lab 03/02/17 1159  NA 138  K 4.2  CL 105  CO2 25  GLUCOSE 133*  BUN 9  CREATININE 0.73  CALCIUM 9.3   Liver Function Tests:  Recent Labs Lab 03/02/17 1159  AST 25  ALT 27  ALKPHOS 74  BILITOT 0.6  PROT 7.2  ALBUMIN 4.0   CBC:  Recent Labs Lab 03/02/17 1159  WBC 8.7  NEUTROABS 6.0  HGB 15.7*  HCT 47.7*  MCV 92.1  PLT 244   Cardiac Enzymes:  Recent Labs Lab 03/02/17 1159  CKTOTAL 184    Studies:  Dg Chest 1 View  Result Date: 03/02/2017 CLINICAL DATA:  CVA. EXAM: CHEST 1 VIEW COMPARISON:  02/10/2017 chest radiograph FINDINGS: The cardiomediastinal silhouette is unremarkable. Minimal left basilar scarring again noted. There is no evidence of focal airspace disease, pulmonary edema, suspicious pulmonary nodule/mass, pleural effusion, or pneumothorax. No acute bony abnormalities are identified. IMPRESSION: No active disease. Electronically Signed   By: Margarette Canada M.D.   On: 03/02/2017 18:09   Ct Head Wo Contrast  Result Date:  03/02/2017 CLINICAL DATA:  Right-sided extremity weakness and facial droop for 3 days. Mild diffuse headache. EXAM: CT HEAD WITHOUT CONTRAST TECHNIQUE: Contiguous axial images were obtained from the base of the skull through the vertex without intravenous contrast. COMPARISON:  02/10/2017 FINDINGS: Brain: New blurring and mild swelling of the left caudate head. New small area of posterior left frontal gray-white differentiation loss, involving the precentral gyrus. There have been numerous previous cortically based infarcts that are more well-defined and chronic appearing/stable. Multiple small bilateral chronic appearing cerebellar infarcts. No hemorrhage or hydrocephalus. Vascular: Arterial calcification. Skull: No acute or aggressive finding. Sinuses/Orbits: Negative IMPRESSION: 1. Small acute infarct in the posterior left frontal lobe, involving the precentral gyrus. 2. Acute infarct involving the left caudate head. 3. Multiple remote appearing small infarcts in the bilateral cerebrum and cerebellum. Electronically Signed   By: Monte Fantasia M.D.   On: 03/02/2017 12:58   Mr Brain Wo Contrast  Addendum Date: 03/02/2017   ADDENDUM REPORT: 03/02/2017 18:16 ADDENDUM: Critical Value/emergent results were called by telephone at the time of interpretation on 03/02/2017 at 6:15 pm to Dr. Linna Darner, who verbally acknowledged these results. Electronically Signed   By: Ulyses Jarred M.D.   On: 03/02/2017 18:16   Result Date: 03/02/2017 CLINICAL DATA:  Right-sided weakness and numbness. EXAM: MRI HEAD WITHOUT CONTRAST MRA HEAD WITHOUT CONTRAST TECHNIQUE: Multiplanar, multiecho pulse sequences of the brain and surrounding structures were obtained without intravenous contrast. Angiographic images of the head were obtained using MRA technique without contrast. COMPARISON:  Head CT 03/02/2017 FINDINGS: MRI HEAD FINDINGS Brain: The midline structures are normal. There are multiple areas of diffusion restriction,  predominantly concentrated in the left frontal and parietal lobes and involving the left precentral gyrus and, to a lesser extent the postcentral and supra marginal gyri. There are smaller foci of diffusion restriction more inferiorly in the left frontal and parietal lobes. Additionally, there is diffusion restriction of the left caudate head. There is a single punctate focus of diffusion restriction within the anterolateral left temporal lobe. There are no contralateral foci of ischemia. There is no acute hemorrhage or mass effect. There are multiple old cerebellar infarcts. There are areas of hyperintense T2 weighted signal corresponding to the regions of restricted diffusion. There is also multifocal white matter hyperintensity suggesting chronic microvascular ischemia. No chronic microhemorrhage. There is multifocal hyperintense T2-weighted signal within the periventricular white matter, most often seen in the setting of chronic microvascular ischemia. No intraparenchymal hematoma or chronic microhemorrhage. Brain volume is normal for age without age-advanced or lobar predominant atrophy. The dura is normal and there is no extra-axial collection. Vascular: The left internal carotid artery flow void is abnormal at the skullbase. Skull and upper cervical spine: The visualized skull base, calvarium, upper cervical spine and extracranial soft tissues are normal. Sinuses/Orbits: No fluid levels or advanced mucosal thickening. No mastoid effusion. Normal orbits. MRA HEAD FINDINGS Intracranial internal carotid arteries: There is near-total loss of flow-related enhancement within the left internal carotid artery, with minimal  enhancement at the carotid terminus. There is no enhancement seen within the visualized cervical portion of the left ICA. The right internal carotid artery is normal. Anterior cerebral arteries: Normal. Middle cerebral arteries: There is diminished flow related enhancement within the left middle  cerebral artery and its branches, but they remain patent. Posterior communicating arteries: Present bilaterally. Diminished enhancement on the left. Posterior cerebral arteries: Normal. Basilar artery: Normal. Vertebral arteries: Right-dominant. Normal. Superior cerebellar arteries: Normal. Anterior inferior cerebellar arteries: Normal. Posterior inferior cerebellar arteries: Normal. IMPRESSION: 1. Occlusion of the left internal carotid artery including the visualized cervical segment, with minimal enhancement in the carotid terminus due to collateral flow from the complete circle of Willis. 2. Multifocal acute left-sided infarct involving the left frontal, parietal and temporal lobes. The largest area of ischemia is within the left precentral gyrus. 3. Gyral edema without significant mass effect. No acute hemorrhage. 4. 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 I have paged Dyanne Carrel at 6:00 p.m. Electronically Signed: By: Ulyses Jarred M.D. On: 03/02/2017 18:03   Mr Jodene Nam Head/brain HA Cm  Addendum Date: 03/02/2017   ADDENDUM REPORT: 03/02/2017 18:16 ADDENDUM: Critical Value/emergent results were called by telephone at the time of interpretation on 03/02/2017 at 6:15 pm to Dr. Linna Darner, who verbally acknowledged these results. Electronically Signed   By: Ulyses Jarred M.D.   On: 03/02/2017 18:16   Result Date: 03/02/2017 CLINICAL DATA:  Right-sided weakness and numbness. EXAM: MRI HEAD WITHOUT CONTRAST MRA HEAD WITHOUT CONTRAST TECHNIQUE: Multiplanar, multiecho pulse sequences of the brain and surrounding structures were obtained without intravenous contrast. Angiographic images of the head were obtained using MRA technique without contrast. COMPARISON:  Head CT 03/02/2017 FINDINGS: MRI HEAD FINDINGS Brain: The midline structures are normal. There are multiple areas of diffusion restriction, predominantly concentrated in the left frontal and parietal lobes and involving the  left precentral gyrus and, to a lesser extent the postcentral and supra marginal gyri. There are smaller foci of diffusion restriction more inferiorly in the left frontal and parietal lobes. Additionally, there is diffusion restriction of the left caudate head. There is a single punctate focus of diffusion restriction within the anterolateral left temporal lobe. There are no contralateral foci of ischemia. There is no acute hemorrhage or mass effect. There are multiple old cerebellar infarcts. There are areas of hyperintense T2 weighted signal corresponding to the regions of restricted diffusion. There is also multifocal white matter hyperintensity suggesting chronic microvascular ischemia. No chronic microhemorrhage. There is multifocal hyperintense T2-weighted signal within the periventricular white matter, most often seen in the setting of chronic microvascular ischemia. No intraparenchymal hematoma or chronic microhemorrhage. Brain volume is normal for age without age-advanced or lobar predominant atrophy. The dura is normal and there is no extra-axial collection. Vascular: The left internal carotid artery flow void is abnormal at the skullbase. Skull and upper cervical spine: The visualized skull base, calvarium, upper cervical spine and extracranial soft tissues are normal. Sinuses/Orbits: No fluid levels or advanced mucosal thickening. No mastoid effusion. Normal orbits. MRA HEAD FINDINGS Intracranial internal carotid arteries: There is near-total loss of flow-related enhancement within the left internal carotid artery, with minimal enhancement at the carotid terminus. There is no enhancement seen within the visualized cervical portion of the left ICA. The right internal carotid artery is normal. Anterior cerebral arteries: Normal. Middle cerebral arteries: There is diminished flow related enhancement within the left middle cerebral artery and its branches, but they remain patent. Posterior communicating  arteries: Present  bilaterally. Diminished enhancement on the left. Posterior cerebral arteries: Normal. Basilar artery: Normal. Vertebral arteries: Right-dominant. Normal. Superior cerebellar arteries: Normal. Anterior inferior cerebellar arteries: Normal. Posterior inferior cerebellar arteries: Normal. IMPRESSION: 1. Occlusion of the left internal carotid artery including the visualized cervical segment, with minimal enhancement in the carotid terminus due to collateral flow from the complete circle of Willis. 2. Multifocal acute left-sided infarct involving the left frontal, parietal and temporal lobes. The largest area of ischemia is within the left precentral gyrus. 3. Gyral edema without significant mass effect. No acute hemorrhage. 4. 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 I have paged Dyanne Carrel at 6:00 p.m. Electronically Signed: By: Ulyses Jarred M.D. On: 03/02/2017 18:03    Scheduled Meds: .  stroke: mapping our early stages of recovery book   Does not apply Once  . aspirin  300 mg Rectal Daily   Or  . aspirin  325 mg Oral Daily  . atorvastatin  40 mg Oral q1800  . busPIRone  10 mg Oral BID  . DULoxetine  30 mg Oral Daily  . enoxaparin (LOVENOX) injection  40 mg Subcutaneous Q24H  . gabapentin  300 mg Oral QID  . nicotine  21 mg Transdermal Daily  . pantoprazole  40 mg Oral Daily   Continuous Infusions:  Principal Problem:   CVA (cerebrovascular accident) ([place]) Active Problems:   Essential hypertension   Tobacco dependence   Hyperlipidemia LDL goal <100   Chronic pain   ETOH abuse    Time spent: 25 minutes    Barton Dubois  [facility] Pager [phone]. If 7PM-7AM, please contact night-coverage at [email or web address], password [facility] 03/03/2017, 11:20 PM  LOS: 1 day

## 2017-03-04 ENCOUNTER — Inpatient Hospital Stay (HOSPITAL_COMMUNITY): Payer: Medicaid Other

## 2017-03-04 ENCOUNTER — Encounter (HOSPITAL_COMMUNITY): Payer: Self-pay

## 2017-03-04 DIAGNOSIS — I63039 Cerebral infarction due to thrombosis of unspecified carotid artery: Secondary | ICD-10-CM

## 2017-03-04 DIAGNOSIS — G8929 Other chronic pain: Secondary | ICD-10-CM

## 2017-03-04 DIAGNOSIS — I6522 Occlusion and stenosis of left carotid artery: Secondary | ICD-10-CM

## 2017-03-04 LAB — BASIC METABOLIC PANEL
Anion gap: 12 (ref 5–15)
BUN: 7 mg/dL (ref 6–20)
CHLORIDE: 108 mmol/L (ref 101–111)
CO2: 20 mmol/L — AB (ref 22–32)
CREATININE: 0.56 mg/dL (ref 0.44–1.00)
Calcium: 9.5 mg/dL (ref 8.9–10.3)
GFR calc Af Amer: >60 mL/min (ref >60)
GFR calc non Af Amer: >60 mL/min (ref >60)
Glucose, Bld: 117 mg/dL — ABNORMAL HIGH (ref 65–99)
POTASSIUM: 3.7 mmol/L (ref 3.5–5.1)
Sodium: 140 mmol/L (ref 135–145)

## 2017-03-04 LAB — HEMOGLOBIN A1C
Hgb A1c MFr Bld: 5.5 % (ref 4.8–5.6)
Mean Plasma Glucose: 111 mg/dL

## 2017-03-04 LAB — MAGNESIUM: Magnesium: 1.8 mg/dL (ref 1.7–2.4)

## 2017-03-04 MED ORDER — IOPAMIDOL (ISOVUE-370) INJECTION 76%
INTRAVENOUS | Status: AC
  Administered 2017-03-04: 50 mL
  Filled 2017-03-04: qty 50

## 2017-03-04 MED ORDER — CLOPIDOGREL BISULFATE 75 MG PO TABS
75.0000 mg | ORAL_TABLET | Freq: Every day | ORAL | Status: DC
  Administered 2017-03-04: 75 mg via ORAL
  Filled 2017-03-04: qty 1

## 2017-03-04 MED ORDER — AMLODIPINE BESYLATE 10 MG PO TABS: 10.0000 mg | ORAL_TABLET | Freq: Every day | ORAL | 2 refills | Status: DC

## 2017-03-04 MED ORDER — CLOPIDOGREL BISULFATE 75 MG PO TABS: ORAL_TABLET | ORAL | 0 refills | Status: DC

## 2017-03-04 MED ORDER — STROKE: EARLY STAGES OF RECOVERY BOOK: 1.0000 | Freq: Once | Status: AC

## 2017-03-04 MED ORDER — ACETAMINOPHEN 325 MG PO TABS: 650.0000 mg | ORAL_TABLET | ORAL | 0 refills | Status: DC | PRN

## 2017-03-04 MED ORDER — ASPIRIN 325 MG PO TABS: 325.0000 mg | ORAL_TABLET | Freq: Every day | ORAL | Status: DC

## 2017-03-04 MED ORDER — NICOTINE 21 MG/24HR TD PT24: 21.0000 mg | MEDICATED_PATCH | Freq: Every day | TRANSDERMAL | 1 refills | Status: DC

## 2017-03-04 MED ORDER — ASPIRIN EC 81 MG PO TBEC
81.0000 mg | DELAYED_RELEASE_TABLET | Freq: Every day | ORAL | Status: DC
  Administered 2017-03-04: 81 mg via ORAL
  Filled 2017-03-04: qty 1

## 2017-03-04 MED ORDER — ASPIRIN EC 81 MG PO TBEC: DELAYED_RELEASE_TABLET | ORAL | 0 refills | Status: DC

## 2017-03-04 MED ORDER — ATORVASTATIN CALCIUM 40 MG PO TABS: 40.0000 mg | ORAL_TABLET | Freq: Every day | ORAL | 1 refills | Status: DC

## 2017-03-04 MED ORDER — ASPIRIN 325 MG PO TABS: 325.0000 mg | ORAL_TABLET | Freq: Every day | ORAL | 3 refills | Status: DC

## 2017-03-04 NOTE — Progress Notes (Addendum)
STROKE TEAM PROGRESS NOTE   SUBJECTIVE (INTERVAL HISTORY) Patient has no new complaints today. Discussed the atherosclerosis and occluded carotid artery and need for medication compliance and vascular risk factor control.  OBJECTIVE Temp:  [98 F (36.7 C)-98.8 F (37.1 C)] 98.2 F (36.8 C) (06/15 0557) Pulse Rate:  [81-90] 87 (06/15 0557) Cardiac Rhythm: Normal sinus rhythm (06/15 0900) Resp:  [18-20] 20 (06/15 0557) BP: (156-182)/(84-102) 166/102 (06/15 0557) SpO2:  [96 %-100 %] 99 % (06/15 0557)  CBC:   Recent Labs Lab 03/02/17 1159  WBC 8.7  NEUTROABS 6.0  HGB 15.7*  HCT 47.7*  MCV 92.1  PLT 932    Basic Metabolic Panel:   Recent Labs Lab 03/02/17 1159 03/04/17 0631  NA 138 140  K 4.2 3.7  CL 105 108  CO2 25 20*  GLUCOSE 133* 117*  BUN 9 7  CREATININE 0.73 0.56  CALCIUM 9.3 9.5  MG  --  1.8    Lipid Panel:     Component Value Date/Time   CHOL 146 03/03/2017 0351   TRIG 105 03/03/2017 0351   HDL 46 03/03/2017 0351   CHOLHDL 3.2 03/03/2017 0351   VLDL 21 03/03/2017 0351   LDLCALC 79 03/03/2017 0351   HgbA1c:  Lab Results  Component Value Date   HGBA1C 5.5 03/03/2017   Urine Drug Screen:     Component Value Date/Time   LABOPIA NONE DETECTED 03/02/2017 0119   COCAINSCRNUR NONE DETECTED 03/02/2017 0119   LABBENZ NONE DETECTED 03/02/2017 0119   AMPHETMU NONE DETECTED 03/02/2017 0119   THCU POSITIVE (A) 03/02/2017 0119   LABBARB NONE DETECTED 03/02/2017 0119    Alcohol Level No results found for: Operating Room Services  IMAGING Dg Chest 1 View 03/02/2017 CLINICAL DATA:  CVA. EXAM: CHEST 1 VIEW COMPARISON:  02/10/2017 chest radiograph FINDINGS: IMPRESSION: No active disease.  Ct Head Wo Contrast 03/02/2017 IMPRESSION: 1. Small acute infarct in the posterior left frontal lobe, involving the precentral gyrus. 2. Acute infarct involving the left caudate head. 3. Multiple remote appearing small infarcts in the bilateral cerebrum and cerebellum.    MRI Head Without  Contrast MRA Head Without Contrast 03/02/2017 IMPRESSION: 1. Occlusion of the left internal carotid artery including the visualized cervical segment, with minimal enhancement in the carotid terminus due to collateral flow from the complete circle of Willis. 2. Multifocal acute left-sided infarct involving the left frontal, parietal and temporal lobes. The largest area of ischemia is within the left precentral gyrus. 3. Gyral edema without significant mass effect. No acute hemorrhage. 4. 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  CT HEAD 03/03/2017 Bilateral MCA territory infarcts of varying ages. No hemorrhagic conversion. Old small cerebellar infarcts.  CTA NECK 03/03/2017 Occluded LEFT internal carotid artery at the origin due to atherosclerosis. Occluded LEFT vertebral artery origin with thready reconstitution. Severe stenosis RIGHT vertebral artery origin. Old C7, T2 and T5 compression fractures.  CTA HEAD 03/03/2017 Reconstitution of LEFT supraclinoid internal carotid artery. No emergent large vessel occlusion. Attenuated, patent LEFT anterior cerebral artery. Moderate stenoses bilateral mid to distal posterior cerebral arteries compatible with atherosclerosis.  2D Echo 03/03/2017 Study Conclusions - Left ventricle: The cavity size was normal. There was mild focal basal hypertrophy of the septum. Systolic function was normal.  The estimated ejection fraction was in the range of 60% to 65%.  Doppler parameters are consistent with both elevated ventricular end-diastolic filling pressure and elevated left atrial filling   pressure.  - Aortic valve: Valve area (VTI): 2.02 cm^2.  Valve area (Vmax): 1.58 cm^2. Valve area (Vmean): 2.46 cm^2. - Left atrium: The atrium was mildly dilated. - Atrial septum: No defect or patent foramen ovale was identified.   PHYSICAL EXAM Exam: Gen: NAD Eyes: anicteric sclerae, moist conjunctivae                    CV: no  MRG, no carotid bruits, no peripheral edema Mental Status: Alert, follows commands, good historian  Neuro: Detailed Neurologic Exam  Speech:    No aphasia, no dysarthria  Cranial Nerves:    The pupils are equal, round, and reactive to light.. Attempted, Fundi not visualized.  EOMI. No gaze preference. Visual fields full. Face symmetric, Tongue midline. Hearing intact to voice. Shoulder shrug intact  Motor Observation:    no involuntary movements noted. Tone appears normal.     Strength:    Right mild hemiparesis  Plantars downgoing.    ASSESSMENT/PLAN [name] is a 56 y.o. female with history of stroke, chronic back pain, ETOH abuse, hyperlipidemia, and tobacco abuse presenting with new onset right upper and lower extremity weakness with sensory numbness and speech difficulty for the preceding three days. She did not receive IV t-PA due to arriving outside of the treatment window.  Stroke: subacute left precentral gyrus infarct in setting of old infarcts of left caudate head, left occipital lobe, and multiple remote infarcts of the cerebrum and cerebellum secondary to emboli possibly due to ICA stenosis.  CT head: Small acute infarct of left precentral gyrus and left caudate head.  Multiple remote small infarcts in the bilateral cerebrum and cerebellum.    MRI head: Multifocal acute left-sided infarct involving the left frontal, parietal and temporal lobes.  Gyral edema without significant mass effect. Multiple old cerebral and cerebellar infarcts with chronic microvascular ischemia.  MRA head: Occlusion of the left ICA, with collateral flow from the circle of Willis.  Carotid Doppler  Possible left ICA occlusion  CTA of the head and neck shows  Occluded LEFT internal carotid artery at the origin due to atherosclerosis. Occluded LEFT vertebral artery origin with thready reconstitution. Severe stenosis RIGHT vertebral artery origin and other atherosclerotic disease of  vessels.2D Echo no pfo no thrombus  LDL 79  HgbA1c 5.5  Lovenox 40 mg sq daily for VTE prophylaxis Diet Heart Room service appropriate? Yes; Fluid consistency: Thin  Was non compliant prior to admission now on aspirin 325 mg daily. Given large vessel intracranial atherosclerosis, patient should be treated with aspirin 81 mg and clopidogrel 75 mg orally every day x 3 months for secondary stroke prevention. After 3 months, change to aspirin 325mg  alone. Long-term dual antiplatelets are contraindicated due to risk for intracerebral hemorrhage.   Patient counseled to be compliant with her antithrombotic medications  Ongoing aggressive stroke risk factor management  Therapy recommendations: pending  Disposition: pending  Hypertension  Stable Permissive hypertension (OK if < 220/120) but gradually normalize in 5-7 days Long-term BP goal normotensive  Hyperlipidemia  Home meds: none  LDL 79, goal < 70  Added atorvastatin 40 mg PO daily  Continue statin at discharge  Diabetes  HgbA1c 5.5, goal < 7.0  Controlled  Other Stroke Risk Factors  Cigarette smoker advised to stop smoking  ETOH use, advised to drink no more than 1 drink a day  Hospital day # 2  Personally  participated in, made any corrections needed, and agree with history, physical, neuro exam,assessment and plan as stated above.     Sarina Ill,  MD [facility]    Personally examined patient and images, and have participated in and made any corrections needed to history, physical, neuro exam,assessment and plan as stated above.  I have personally obtained the history, evaluated lab date, reviewed imaging studies and agree with radiology interpretations.   Stroke will sign off at this time  Sarina Ill, MD Stroke Neurology     To contact Stroke Continuity provider, please refer to [email or web address] After hours, contact General Neurology

## 2017-03-04 NOTE — Care Management Note (Addendum)
Case Management Note  Patient Details  Name: Paizleigh Wilds MRN: [id number] Date of Birth: [birth year]  Subjective/Objective:   Pt admitted with CVA. She if from home with her uncle who she help to care for.                  Action/Plan: Patient discharging home with recommendations for outpatient services. CM met with the patient and she can not afford or rely on transportation to the outpatient therapy. CM spoke to MD and plan is for [facility] services.  CM spoke to Santiago Glad with [facility] to see if patient will qualify for charity services.  Per patient she has already applied for Medicaid and is waiting on the [employer]. She is active with [facility] and uses [facility] pharmacy for her medications.  Since patient is Medicaid pending she will qualify for charity through [facility] per Santiago Glad.   Expected Discharge Date:  03/05/17               Expected Discharge Plan:  [place]  In-House Referral:     Discharge planning Services  CM Consult  Post Acute Care Choice:  Home Health Choice offered to:  Patient  DME Arranged:    DME Agency:     HH Arranged:  PT, OT, Speech Therapy [place] Agency:  [place]  Status of Service:  Completed, signed off  If discussed at [place] of Stay Meetings, dates discussed:    Additional Comments:  Pollie Friar, RN 03/04/2017, 12:02 PM

## 2017-03-04 NOTE — Progress Notes (Signed)
Occupational Therapy Treatment Patient Details Name: Kelsey Wade MRN: [id number] DOB: 1960/11/22 Today's Date: 03/04/2017    History of present illness Pt is a 56 y/o female admitted from home secondary to R sided weakness. 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 PMH including but not limited to HTN.   OT comments  Pt progressing towards goals this session. Education for sequencing during ADL, elevation and edema control. Pt very excited to get back to her dog. Pt with noted improved shoulder abduction this session. Pt continues to require HHOT upon discharge.  Follow Up Recommendations  Home health OT    Equipment Recommendations  None recommended by OT    Recommendations for Other Services      Precautions / Restrictions Precautions Precautions: Fall Restrictions Weight Bearing Restrictions: No       Mobility Bed Mobility Overal bed mobility: Independent                Transfers Overall transfer level: Modified independent Equipment used: None                  Balance Overall balance assessment: Needs assistance Sitting-balance support: Feet supported Sitting balance-Leahy Scale: Normal     Standing balance support: During functional activity;No upper extremity supported Standing balance-Leahy Scale: Good                             ADL either performed or assessed with clinical judgement   ADL Overall ADL's : Needs assistance/impaired Eating/Feeding: Minimal assistance;Sitting Eating/Feeding Details (indicate cue type and reason): opening yogurt, cutting up food - Pt will have family availabel to assist with these tasks Grooming: Wash/dry hands;Min guard;Standing Grooming Details (indicate cue type and reason): sink level                 Toilet Transfer: Modified Independent;Ambulation Toilet Transfer Details (indicate cue type and reason): in bathroom Toileting- Clothing  Manipulation and Hygiene: Modified independent;Sit to/from stand Toileting - Clothing Manipulation Details (indicate cue type and reason): hospital gown, underwear, and peri care     Functional mobility during ADLs: Modified independent General ADL Comments: increased attention to include RUE in grooming activities     Vision   Vision Assessment?: Vision impaired- to be further tested in functional context Additional Comments: "i've been dealing with my vision being like this for years, Pt able to read from menu"   Perception     Praxis      Cognition Arousal/Alertness: Awake/alert Behavior During Therapy: WFL for tasks assessed/performed Overall Cognitive Status: Within Functional Limits for tasks assessed                                          Exercises     Shoulder Instructions       General Comments Educated Pt in sequence for dressing, Pt displaying increased shoulder abduction this session.    Pertinent Vitals/ Pain       Pain Assessment: 0-10 Pain Score: 4  Pain Location: chronic back pain Pain Descriptors / Indicators: Discomfort Pain Intervention(s): Monitored during session  Home Living  Prior Functioning/Environment              Frequency  Min 2X/week        Progress Toward Goals  OT Goals(current goals can now be found in the care plan section)  Progress towards OT goals: Progressing toward goals  Acute Rehab OT Goals Patient Stated Goal: return home to dog "lil man" OT Goal Formulation: With patient Time For Goal Achievement: 03/17/17 Potential to Achieve Goals: Good  Plan Discharge plan needs to be updated;Frequency remains appropriate    Co-evaluation                 AM-PAC PT "6 Clicks" Daily Activity     Outcome Measure   Help from another person eating meals?: A Little Help from another person taking care of personal grooming?: A Lot Help  from another person toileting, which includes using toliet, bedpan, or urinal?: None Help from another person bathing (including washing, rinsing, drying)?: A Little Help from another person to put on and taking off regular upper body clothing?: A Lot Help from another person to put on and taking off regular lower body clothing?: A Little 6 Click Score: 17    End of Session    OT Visit Diagnosis: Other symptoms and signs involving the nervous system (R29.898);Hemiplegia and hemiparesis Hemiplegia - Right/Left: Right Hemiplegia - dominant/non-dominant: Dominant Hemiplegia - caused by: Cerebral infarction   Activity Tolerance Patient tolerated treatment well   Patient Left in bed;with call bell/phone within reach;with family/visitor present   Nurse Communication Mobility status        Time: 0277-4128 OT Time Calculation (min): 27 min  Charges: OT General Charges $OT Visit: 1 Procedure OT Treatments $Self Care/Home Management : 8-22 mins $Therapeutic Activity: 8-22 mins  Hulda Humphrey OTR/L [place] 03/04/2017, 1:10 PM

## 2017-03-04 NOTE — Discharge Summary (Signed)
Physician Discharge Summary  Kelsey Wade NLZ:[id number] DOB: [birth year] DOA: 03/02/2017  PCP: Dorena Dew, FNP  Admit date: 03/02/2017 Discharge date: 03/04/2017  Time spent: 35 minutes  Recommendations for Outpatient Follow-up:  1. Repeat BMET to follow up electrolytes and renal function  2. Please reassess BP and adjust medications as needed 3. Please assure patient has stopped smoking and is not drinking more than 2-3 alcoholic drinks per week. 4. Reassess Lipid panel and LFT's in 4-6 weeks; adjust hypolipidemic agents as needed    Discharge Diagnoses:  Principal Problem:   CVA (cerebrovascular accident) ([place]) Active Problems:   Essential hypertension   Tobacco dependence   Hyperlipidemia LDL goal <100   Chronic pain   ETOH abuse   Left carotid artery occlusion   Discharge Condition: stable and improved. Discharge home with home health services for PT, OT and SPL. Instructed to follow up with PCP in 2 weeks.  Diet recommendation: heart healthy diet   Brief History of present illness:  56 y/o female with PMH significant for tobacco abuse, HTN, HLD, alcohol use disorder, depression and anxiety; who presented with new onset right upper extremity and lower extremity weakness. Patient endorses onset of symptoms for aprox 48-72 hours prior to seek assistance and reported associated speech difficulty and numbness sensation. Found to have left gyrus ischemic stroke.  Hospital Course:  1-subacute left precentral gyrus infarct -appears to be associated with emboli from ICA stenosis -continue full dose ASA for secondary prevention long-term; patient to use dual therapy for 3 months (plavix and baby aspirin) -carotid duplex with concerns for left ICA stenosis; which was confirmed on CTA head/neck -will continue statins, LDL 79 -A1C 5.5 -2-D echo w/o source of emboli -patient advise to quit smoking and will continue risk others factors modifications   2-HTN -will resume  home antihypertensive regimen  -reassess BP at follow up and adjust medications as needed to achieve normotensive state -patient advise to follow heart healthy die t  3-HLD -will continue statins  4-tobacco abuse -cessation counseling provided -will discharge with prescription for nicoderm  5-alcohol abuse -patient advise not to drink > 2-3 drinks per week.  6-depression/anxiety -continue cymbalta and buspar   7-GERD -continue protonix  Procedures:  See below for x-ray  2-D echo: preserved EF and no wall motion abnormalities; no source of emboli appreciated   Carotid duplex: possible left ICA occlussion  Consultations:  Neurology   Discharge Exam: Vitals:   03/04/17 0557 03/04/17 1400  BP: (!) 166/102 (!) 152/89  Pulse: 87 93  Resp: 20 18  Temp: 98.2 F (36.8 C) 98.5 F (36.9 C)    General: afebrile, no CP, no SOB and no palpitations. Patient reported improvement in her  right facial numbness and dysarthria. Still with significant right upper extremity weakness and essentially lost motor skill performance..  Cardiovascular: S1 and S2, no rubs, no gallops, no murmurs  Respiratory: good air movement, no wheezing, no crackles.  Abdomen: soft, NT, ND, positive BS  Musculoskeletal: no edema or cyanosis, no joint swelling or erythema.  Neurologic exam: patient with right upper extremity weakness and slight off balance/cognitive linguistic impairment. CN grossly intact.   Discharge Instructions   Discharge Instructions    Diet - low sodium heart healthy    Complete by:  As directed    Discharge instructions    Complete by:  As directed    Stop smoking Take medications as prescribed 3 months of dual therapy using aspirin and plavix as instructed Then start  full dose aspirin indefinitely  Arrange follow up with PCP in 2 weeks Follow heart healthy diet and keep yourself well hydrated     Current Discharge Medication List    START taking these  medications   Details   stroke: mapping our early stages of recovery book MISC 1 each by Does not apply route once.    acetaminophen (TYLENOL) 325 MG tablet Take 2 tablets (650 mg total) by mouth every 4 (four) hours as needed for mild pain or headache (or temp > 37.5 C (99.5 F)). Qty: 40 tablet, Refills: 0    aspirin 325 MG tablet Take 1 tablet (325 mg total) by mouth daily. Qty: 30 tablet, Refills: 3    clopidogrel (PLAVIX) 75 MG tablet Take 1 tablet daily along with baby aspirin daily for 3 months. Qty: 90 tablet, Refills: 0    nicotine (NICODERM CQ - DOSED IN MG/24 HOURS) 21 mg/24hr patch Place 1 patch (21 mg total) onto the skin daily. Qty: 28 patch, Refills: 1      CONTINUE these medications which have CHANGED   Details  amLODipine (NORVASC) 10 MG tablet Take 1 tablet (10 mg total) by mouth daily. Qty: 30 tablet, Refills: 2   Associated Diagnoses: Essential hypertension    aspirin EC 81 MG tablet Take 1 tablet daily along with plavix 75mg  daily Qty: 90 tablet, Refills: 0   Associated Diagnoses: Hyperlipidemia LDL goal <100    atorvastatin (LIPITOR) 40 MG tablet Take 1 tablet (40 mg total) by mouth daily. Qty: 30 tablet, Refills: 1   Associated Diagnoses: Hyperlipidemia LDL goal <100      CONTINUE these medications which have NOT CHANGED   Details  busPIRone (BUSPAR) 10 MG tablet Take 1 tablet (10 mg total) by mouth 2 (two) times daily. Qty: 60 tablet, Refills: 2   Associated Diagnoses: Anxiety    DULoxetine (CYMBALTA) 30 MG capsule Take 30 mg by mouth daily.  Refills: 5    gabapentin (NEURONTIN) 300 MG capsule Take 1 capsule (300 mg total) by mouth 4 (four) times daily. Qty: 120 capsule, Refills: 1   Associated Diagnoses: Chronic right-sided low back pain with right-sided sciatica; Neuropathy    omeprazole (PRILOSEC) 20 MG capsule Take 1 capsule (20 mg total) by mouth daily. Qty: 30 capsule, Refills: 3   Associated Diagnoses: Gastroesophageal reflux disease  without esophagitis      STOP taking these medications     methocarbamol (ROBAXIN) 500 MG tablet        No Known Allergies Follow-up Information    Dorena Dew, FNP. Schedule an appointment as soon as possible for a visit in 2 week(s).   Specialty:  Family Medicine Contact information: [address]. [place] [address] [phone]           The results of significant diagnostics from this hospitalization (including imaging, microbiology, ancillary and laboratory) are listed below for reference.    Significant Diagnostic Studies: Ct Angio Head W Or Wo Contrast  Result Date: 03/04/2017 CLINICAL DATA:  Stroke. History of LEFT internal carotid artery occlusion, hypertension and hyperlipidemia. EXAM: CT ANGIOGRAPHY HEAD AND NECK TECHNIQUE: Multidetector CT imaging of the head and neck was performed using the standard protocol during bolus administration of intravenous contrast. Multiplanar CT image reconstructions and MIPs were obtained to evaluate the vascular anatomy. Carotid stenosis measurements (when applicable) are obtained utilizing NASCET criteria, using the distal internal carotid diameter as the denominator. CONTRAST:  50 cc Isovue 370 COMPARISON:  None.  FINDINGS: CT HEAD FINDINGS BRAIN: No intraparenchymal hemorrhage, mass effect nor midline shift. Bifrontal, LEFT parietal lobe subacute to chronic infarcts. Re- demonstration of acute LEFT caudate head infarct. Old cerebellar infarcts better characterized on prior MRI. Ventricles and sulci are normal for patient's age. No midline shift, mass effect. No abnormal extra-axial fluid collections. Basal cisterns are patent head. VASCULAR: Mild calcific atherosclerosis of the carotid siphons. SKULL: No skull fracture. No significant scalp soft tissue swelling. SINUSES/ORBITS: The mastoid air-cells and included paranasal sinuses are well-aerated.The included ocular globes and orbital contents are non-suspicious. OTHER:  Patient is edentulous. CTA NECK AORTIC ARCH: Aortic arch is normal course and caliber with moderate intimal thickening. Arch vessel origins are patent though, there is moderate stenosis LEFT Common carotid artery origin. Moderate luminal irregularity LEFT subclavian artery compatible with atherosclerosis with focal moderate stenosis distal LEFT subclavian artery. RIGHT CAROTID SYSTEM: Common carotid artery is widely patent, mild luminal irregularity compatible with atherosclerosis. Moderate calcific atherosclerosis of the carotid bifurcation without hemodynamically significant stenosis by NASCET criteria. Normal appearance of the included internal carotid artery. LEFT CAROTID SYSTEM: Mild luminal irregularity compatible with atherosclerosis LEFT Common carotid artery. Intimal thickening calcific atherosclerosis results in occluded LEFT internal carotid artery from the origin distally. VERTEBRAL ARTERIES:High-grade stenosis RIGHT vertebral artery origin. Occluded LEFT vertebral artery with thready tandem reconstitution via musculocutaneous branches. Calcific atherosclerosis results in moderate stenosis RIGHT mid P2 segment. SKELETON: No acute osseous process though bone windows have not been submitted. Old Moderate T5, mild T2 and moderate T7 compression fractures with Schmorl's nodes. OTHER NECK: Soft tissues of the neck are non-acute though, not tailored for evaluation. Apical bullous changes. CTA HEAD ANTERIOR CIRCULATION: Patent RIGHT internal carotid artery of mild luminal irregularity compatible with atherosclerosis. LEFT internal carotid artery is occluded to the supraclinoid segment, reconstitution in part by retrograde flow LEFT ophthalmic artery. Patent anterior communicating artery with thready enhancement LEFT ACA. The LEFT M1 segment is smaller than the RIGHT which may be dynamic, bilateral middle cerebral arteries are patent including distal segments. No contrast extravasation or aneurysm. POSTERIOR  CIRCULATION: Bilateral intradural vertebral artery's are patent. Patent main branch vessels. Diminutive RIGHT P1 segment with robust RIGHT posterior communicating artery. Tiny LEFT posterior communicating artery present. Patent bilateral posterior cerebral artery's. Moderate tandem stenosis mid to distal bilateral posterior cerebral artery's. No acute large vessel occlusion, contrast extravasation or aneurysm. VENOUS SINUSES: Major dural venous sinuses are patent though not tailored for evaluation on this angiographic examination. ANATOMIC VARIANTS: None. DELAYED PHASE: No abnormal intracranial enhancement. MIP images reviewed. IMPRESSION: CT HEAD: Bilateral MCA territory infarcts of varying ages. No hemorrhagic conversion. Old small cerebellar infarcts. CTA NECK: Occluded LEFT internal carotid artery at the origin due to atherosclerosis. Occluded LEFT vertebral artery origin with thready reconstitution. Severe stenosis RIGHT vertebral artery origin. Old C7, T2 and T5 compression fractures. CTA HEAD: Reconstitution of LEFT supraclinoid internal carotid artery. No emergent large vessel occlusion. Attenuated, patent LEFT anterior cerebral artery. Moderate stenoses bilateral mid to distal posterior cerebral arteries compatible with atherosclerosis. Electronically Signed   By: Elon Alas M.D.   On: 03/04/2017 04:35   Dg Chest 1 View  Result Date: 03/02/2017 CLINICAL DATA:  CVA. EXAM: CHEST 1 VIEW COMPARISON:  02/10/2017 chest radiograph FINDINGS: The cardiomediastinal silhouette is unremarkable. Minimal left basilar scarring again noted. There is no evidence of focal airspace disease, pulmonary edema, suspicious pulmonary nodule/mass, pleural effusion, or pneumothorax. No acute bony abnormalities are identified. IMPRESSION: No active disease. Electronically Signed   By:  Margarette Canada M.D.   On: 03/02/2017 18:09   Dg Chest 2 View  Result Date: 02/10/2017 CLINICAL DATA:  56 year old female with pain radiating  to the right side for 1 week. Chest pain. EXAM: CHEST  2 VIEW COMPARISON:  None. FINDINGS: Lung volumes are within normal limits. Mediastinal contours are within normal limits. Visualized tracheal air column is within normal limits. No pneumothorax, pulmonary edema, pleural effusion or confluent pulmonary opacity. Negative visible bowel gas pattern. There are compression fractures in the thoracic spine at both the T5 and T12 levels (lateral view) with more loss of height at the latter. These are age indeterminate but appear probably nonacute. Other visible osseous structures appear intact. IMPRESSION: 1. Age indeterminate compression fractures in the thoracic spine at approximately T5 and T12. If specific therapy such as vertebroplasty is desired, Thoracic MRI or Nuclear Medicine Whole-body Bone Scan would best determine acuity. 2.  No acute cardiopulmonary abnormality. Electronically Signed   By: Genevie Ann M.D.   On: 02/10/2017 16:35   Ct Head Wo Contrast  Result Date: 03/02/2017 CLINICAL DATA:  Right-sided extremity weakness and facial droop for 3 days. Mild diffuse headache. EXAM: CT HEAD WITHOUT CONTRAST TECHNIQUE: Contiguous axial images were obtained from the base of the skull through the vertex without intravenous contrast. COMPARISON:  02/10/2017 FINDINGS: Brain: New blurring and mild swelling of the left caudate head. New small area of posterior left frontal gray-white differentiation loss, involving the precentral gyrus. There have been numerous previous cortically based infarcts that are more well-defined and chronic appearing/stable. Multiple small bilateral chronic appearing cerebellar infarcts. No hemorrhage or hydrocephalus. Vascular: Arterial calcification. Skull: No acute or aggressive finding. Sinuses/Orbits: Negative IMPRESSION: 1. Small acute infarct in the posterior left frontal lobe, involving the precentral gyrus. 2. Acute infarct involving the left caudate head. 3. Multiple remote appearing  small infarcts in the bilateral cerebrum and cerebellum. Electronically Signed   By: Monte Fantasia M.D.   On: 03/02/2017 12:58   Ct Head Wo Contrast  Result Date: 02/10/2017 CLINICAL DATA:  Back pain. Numbness and tingling causing legs to give out. EXAM: CT HEAD WITHOUT CONTRAST TECHNIQUE: Contiguous axial images were obtained from the base of the skull through the vertex without intravenous contrast. COMPARISON:  None. FINDINGS: Brain: Small remote appearing infarcts in the cortex of the left frontal parietal convexity, most confluent in the parietal lobe just above the occipital parietal sulcus. Small bilateral cerebellar remote appearing infarcts. No acute appearing infarct, hemorrhage, hydrocephalus, or masslike finding. Vascular: No hyperdense vessel. Siphon and left vertebral atherosclerotic calcification. Skull: No acute finding Sinuses/Orbits: No acute finding IMPRESSION: 1. No acute finding. 2. 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 Electronically Signed   By: Monte Fantasia M.D.   On: 02/10/2017 16:54   Ct Angio Neck W Or Wo Contrast  Result Date: 03/04/2017 CLINICAL DATA:  Stroke. History of LEFT internal carotid artery occlusion, hypertension and hyperlipidemia. EXAM: CT ANGIOGRAPHY HEAD AND NECK TECHNIQUE: Multidetector CT imaging of the head and neck was performed using the standard protocol during bolus administration of intravenous contrast. Multiplanar CT image reconstructions and MIPs were obtained to evaluate the vascular anatomy. Carotid stenosis measurements (when applicable) are obtained utilizing NASCET criteria, using the distal internal carotid diameter as the denominator. CONTRAST:  50 cc Isovue 370 COMPARISON:  None. FINDINGS: CT HEAD FINDINGS BRAIN: No intraparenchymal hemorrhage, mass effect nor midline shift. Bifrontal, LEFT parietal lobe subacute to chronic infarcts. Re-  demonstration of acute LEFT  caudate head infarct. Old cerebellar infarcts better characterized on prior MRI. Ventricles and sulci are normal for patient's age. No midline shift, mass effect. No abnormal extra-axial fluid collections. Basal cisterns are patent head. VASCULAR: Mild calcific atherosclerosis of the carotid siphons. SKULL: No skull fracture. No significant scalp soft tissue swelling. SINUSES/ORBITS: The mastoid air-cells and included paranasal sinuses are well-aerated.The included ocular globes and orbital contents are non-suspicious. OTHER: Patient is edentulous. CTA NECK AORTIC ARCH: Aortic arch is normal course and caliber with moderate intimal thickening. Arch vessel origins are patent though, there is moderate stenosis LEFT Common carotid artery origin. Moderate luminal irregularity LEFT subclavian artery compatible with atherosclerosis with focal moderate stenosis distal LEFT subclavian artery. RIGHT CAROTID SYSTEM: Common carotid artery is widely patent, mild luminal irregularity compatible with atherosclerosis. Moderate calcific atherosclerosis of the carotid bifurcation without hemodynamically significant stenosis by NASCET criteria. Normal appearance of the included internal carotid artery. LEFT CAROTID SYSTEM: Mild luminal irregularity compatible with atherosclerosis LEFT Common carotid artery. Intimal thickening calcific atherosclerosis results in occluded LEFT internal carotid artery from the origin distally. VERTEBRAL ARTERIES:High-grade stenosis RIGHT vertebral artery origin. Occluded LEFT vertebral artery with thready tandem reconstitution via musculocutaneous branches. Calcific atherosclerosis results in moderate stenosis RIGHT mid P2 segment. SKELETON: No acute osseous process though bone windows have not been submitted. Old Moderate T5, mild T2 and moderate T7 compression fractures with Schmorl's nodes. OTHER NECK: Soft tissues of the neck are non-acute though, not tailored for evaluation. Apical bullous changes.  CTA HEAD ANTERIOR CIRCULATION: Patent RIGHT internal carotid artery of mild luminal irregularity compatible with atherosclerosis. LEFT internal carotid artery is occluded to the supraclinoid segment, reconstitution in part by retrograde flow LEFT ophthalmic artery. Patent anterior communicating artery with thready enhancement LEFT ACA. The LEFT M1 segment is smaller than the RIGHT which may be dynamic, bilateral middle cerebral arteries are patent including distal segments. No contrast extravasation or aneurysm. POSTERIOR CIRCULATION: Bilateral intradural vertebral artery's are patent. Patent main branch vessels. Diminutive RIGHT P1 segment with robust RIGHT posterior communicating artery. Tiny LEFT posterior communicating artery present. Patent bilateral posterior cerebral artery's. Moderate tandem stenosis mid to distal bilateral posterior cerebral artery's. No acute large vessel occlusion, contrast extravasation or aneurysm. VENOUS SINUSES: Major dural venous sinuses are patent though not tailored for evaluation on this angiographic examination. ANATOMIC VARIANTS: None. DELAYED PHASE: No abnormal intracranial enhancement. MIP images reviewed. IMPRESSION: CT HEAD: Bilateral MCA territory infarcts of varying ages. No hemorrhagic conversion. Old small cerebellar infarcts. CTA NECK: Occluded LEFT internal carotid artery at the origin due to atherosclerosis. Occluded LEFT vertebral artery origin with thready reconstitution. Severe stenosis RIGHT vertebral artery origin. Old C7, T2 and T5 compression fractures. CTA HEAD: Reconstitution of LEFT supraclinoid internal carotid artery. No emergent large vessel occlusion. Attenuated, patent LEFT anterior cerebral artery. Moderate stenoses bilateral mid to distal posterior cerebral arteries compatible with atherosclerosis. Electronically Signed   By: Elon Alas M.D.   On: 03/04/2017 04:35   Mr Brain Wo Contrast  Addendum Date: 03/02/2017   ADDENDUM REPORT:  03/02/2017 18:16 ADDENDUM: Critical Value/emergent results were called by telephone at the time of interpretation on 03/02/2017 at 6:15 pm to Dr. Linna Darner, who verbally acknowledged these results. Electronically Signed   By: Ulyses Jarred M.D.   On: 03/02/2017 18:16   Result Date: 03/02/2017 CLINICAL DATA:  Right-sided weakness and numbness. EXAM: MRI HEAD WITHOUT CONTRAST MRA HEAD WITHOUT CONTRAST TECHNIQUE: Multiplanar, multiecho pulse sequences of the brain and surrounding  structures were obtained without intravenous contrast. Angiographic images of the head were obtained using MRA technique without contrast. COMPARISON:  Head CT 03/02/2017 FINDINGS: MRI HEAD FINDINGS Brain: The midline structures are normal. There are multiple areas of diffusion restriction, predominantly concentrated in the left frontal and parietal lobes and involving the left precentral gyrus and, to a lesser extent the postcentral and supra marginal gyri. There are smaller foci of diffusion restriction more inferiorly in the left frontal and parietal lobes. Additionally, there is diffusion restriction of the left caudate head. There is a single punctate focus of diffusion restriction within the anterolateral left temporal lobe. There are no contralateral foci of ischemia. There is no acute hemorrhage or mass effect. There are multiple old cerebellar infarcts. There are areas of hyperintense T2 weighted signal corresponding to the regions of restricted diffusion. There is also multifocal white matter hyperintensity suggesting chronic microvascular ischemia. No chronic microhemorrhage. There is multifocal hyperintense T2-weighted signal within the periventricular white matter, most often seen in the setting of chronic microvascular ischemia. No intraparenchymal hematoma or chronic microhemorrhage. Brain volume is normal for age without age-advanced or lobar predominant atrophy. The dura is normal and there is no extra-axial collection.  Vascular: The left internal carotid artery flow void is abnormal at the skullbase. Skull and upper cervical spine: The visualized skull base, calvarium, upper cervical spine and extracranial soft tissues are normal. Sinuses/Orbits: No fluid levels or advanced mucosal thickening. No mastoid effusion. Normal orbits. MRA HEAD FINDINGS Intracranial internal carotid arteries: There is near-total loss of flow-related enhancement within the left internal carotid artery, with minimal enhancement at the carotid terminus. There is no enhancement seen within the visualized cervical portion of the left ICA. The right internal carotid artery is normal. Anterior cerebral arteries: Normal. Middle cerebral arteries: There is diminished flow related enhancement within the left middle cerebral artery and its branches, but they remain patent. Posterior communicating arteries: Present bilaterally. Diminished enhancement on the left. Posterior cerebral arteries: Normal. Basilar artery: Normal. Vertebral arteries: Right-dominant. Normal. Superior cerebellar arteries: Normal. Anterior inferior cerebellar arteries: Normal. Posterior inferior cerebellar arteries: Normal. IMPRESSION: 1. Occlusion of the left internal carotid artery including the visualized cervical segment, with minimal enhancement in the carotid terminus due to collateral flow from the complete circle of Willis. 2. Multifocal acute left-sided infarct involving the left frontal, parietal and temporal lobes. The largest area of ischemia is within the left precentral gyrus. 3. Gyral edema without significant mass effect. No acute hemorrhage. 4. 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 I have paged Dyanne Carrel at 6:00 p.m. Electronically Signed: By: Ulyses Jarred M.D. On: 03/02/2017 18:03   Mr Cervical Spine Wo Contrast  Result Date: 02/10/2017 CLINICAL DATA:  Initial evaluation for right-sided pain for 1 week. Chronic low back pain. EXAM: MRI  CERVICAL, THORACIC AND LUMBAR SPINE WITHOUT CONTRAST TECHNIQUE: Multiplanar and multiecho pulse sequences of the cervical spine, to include the craniocervical junction and cervicothoracic junction, and thoracic and lumbar spine, were obtained without intravenous contrast. COMPARISON:  None. FINDINGS: MRI CERVICAL SPINE FINDINGS Alignment: Straightening of the normal cervical lordosis. No listhesis. Vertebrae: Chronic height loss with associated degenerative endplate Schmorl's node noted at the superior endplate of C7. Additional endplate Schmorl's node noted at the superior endplate of T2 with mild height loss, also chronic. Vertebral body heights otherwise maintained. No evidence for acute or subacute fracture. Signal intensity within the vertebral body bone marrow within normal limits. No discrete or worrisome osseous lesion. No abnormal marrow  edema. Cord: Signal intensity within the cervical spinal cord is normal. Posterior Fossa, vertebral arteries, paraspinal tissues: Probable small remote lacunar infarct noted within the pons. Visualized brain otherwise grossly unremarkable. Craniocervical junction within normal limits. Paraspinous and prevertebral soft tissues within normal limits. Normal intravascular flow voids present within the vertebral arteries bilaterally. Disc levels: C2-C3: Unremarkable. C3-C4: Normal disc. Mild facet degeneration on the left. No stenosis. C4-C5:  Unremarkable. C5-C6: Normal disc. Mild left-sided facet degeneration. No stenosis. C6-C7: Mild circumferential disc bulge with uncovertebral spurring. No significant canal or foraminal stenosis. C7-T1:  Unremarkable. MRI THORACIC SPINE FINDINGS Alignment: Vertebral bodies normally aligned with preservation of the normal thoracic kyphosis. No listhesis. Vertebrae: Chronic height loss with superimposed degenerative Schmorl's nodes present at the superior endplates of T2, T5, L97 and T12. No significant bony retropulsion. These are chronic in  appearance without associated marrow edema. Vertebral body heights otherwise maintained. No evidence for acute or subacute fracture. Minimal reactive endplate changes noted about the posterior aspect of the T11-12 interspace. Signal intensity within the vertebral body bone marrow is otherwise normal. No discrete or worrisome osseous lesions. Cord:  Signal intensity within the thoracic spinal cord is normal. Paraspinal and other soft tissues: Paraspinous soft tissues within normal limits. Visualized visceral structures unremarkable. Partially visualized lungs are clear. Disc levels: No significant degenerative disc disease identified within knee thoracic spine. Posterior element facet degeneration noted at T10-11, greater on the left. Bony osteophytic spur arising from the left T10-11 facet encroaches upon the left posterior thecal sac (series 14, image 17). No significant stenosis. No other significant degenerative changes of note. No significant canal or foraminal stenosis. MRI LUMBAR SPINE FINDINGS Segmentation: Normal segmentation. Lowest well-formed disc is labeled the L5-S1 level. Alignment: Vertebral bodies normally aligned with preservation of the normal lumbar lordosis. Vertebrae: Chronic compression deformities involving the T11 and T12 vertebral bodies again noted. Vertebral body heights otherwise maintained. No other acute or chronic fracture identified. Chronic reactive endplate changes present about the L5-S1 interspace. Signal intensity within the vertebral body bone marrow otherwise normal. No discrete or worrisome osseous lesions. Conus medullaris: Extends to the L2 level and appears normal. Paraspinal and other soft tissues: Paraspinous soft tissues within normal limits. Visualized visceral structures unremarkable. Disc levels: No significant degenerative changes are seen through the L2-3 level. L3-4: Normal interspace. Mild facet and ligamentum flavum hypertrophy. No stenosis. L4-5: Shallow left  foraminal disc protrusion, closely approximating the exiting left L4 nerve root (series 19, image 11). Moderate facet and ligamentum flavum hypertrophy. Moderate left L4 foraminal stenosis. No significant canal or right foraminal narrowing. L5-S1: Chronic degenerative intervertebral disc space narrowing with reactive endplate changes. Moderate facet and ligamentum flavum hypertrophy. No significant canal or lateral recess stenosis. Mild left with moderate right L5 foraminal narrowing. IMPRESSION: MRI CERVICAL SPINE IMPRESSION: 1. No acute abnormality within the cervical spine, with normal MRI appearance of the cervical spinal cord. 2. Mild degenerative disc bulge at C6-7 without stenosis. 3. Chronic wedging deformity of the C7 vertebral body. No acute fracture. 4. Small remote pontine lacunar infarct. MRI THORACIC SPINE IMPRESSION: 1. No acute abnormality within the thoracic spine, with normal appearance of the thoracic spinal cord. 2. Chronic wedging/compression deformities of the T2, T5, T11, and T12 vertebral bodies. 3. Left greater than right facet degeneration at T10-11 as above without stenosis. No other significant degenerative changes within the thoracic spine. MRI LUMBAR SPINE IMPRESSION: 1. No acute abnormality within the lumbar spine. 2. Chronic degenerative spondylolysis at L5-S1 with resultant  moderate right and mild left L5 foraminal stenosis. 3. Shallow left foraminal disc protrusion at L4-5, closely approximating the exiting left L4 nerve root and resulting in moderate left L4 foraminal stenosis. 4. Multilevel facet hypertrophy within the lower lumbar spine, greatest at L4-5 and L5-S1. Electronically Signed   By: Jeannine Boga M.D.   On: 02/10/2017 22:38   Mr Thoracic Spine Wo Contrast  Result Date: 02/10/2017 CLINICAL DATA:  Initial evaluation for right-sided pain for 1 week. Chronic low back pain. EXAM: MRI CERVICAL, THORACIC AND LUMBAR SPINE WITHOUT CONTRAST TECHNIQUE: Multiplanar and  multiecho pulse sequences of the cervical spine, to include the craniocervical junction and cervicothoracic junction, and thoracic and lumbar spine, were obtained without intravenous contrast. COMPARISON:  None. FINDINGS: MRI CERVICAL SPINE FINDINGS Alignment: Straightening of the normal cervical lordosis. No listhesis. Vertebrae: Chronic height loss with associated degenerative endplate Schmorl's node noted at the superior endplate of C7. Additional endplate Schmorl's node noted at the superior endplate of T2 with mild height loss, also chronic. Vertebral body heights otherwise maintained. No evidence for acute or subacute fracture. Signal intensity within the vertebral body bone marrow within normal limits. No discrete or worrisome osseous lesion. No abnormal marrow edema. Cord: Signal intensity within the cervical spinal cord is normal. Posterior Fossa, vertebral arteries, paraspinal tissues: Probable small remote lacunar infarct noted within the pons. Visualized brain otherwise grossly unremarkable. Craniocervical junction within normal limits. Paraspinous and prevertebral soft tissues within normal limits. Normal intravascular flow voids present within the vertebral arteries bilaterally. Disc levels: C2-C3: Unremarkable. C3-C4: Normal disc. Mild facet degeneration on the left. No stenosis. C4-C5:  Unremarkable. C5-C6: Normal disc. Mild left-sided facet degeneration. No stenosis. C6-C7: Mild circumferential disc bulge with uncovertebral spurring. No significant canal or foraminal stenosis. C7-T1:  Unremarkable. MRI THORACIC SPINE FINDINGS Alignment: Vertebral bodies normally aligned with preservation of the normal thoracic kyphosis. No listhesis. Vertebrae: Chronic height loss with superimposed degenerative Schmorl's nodes present at the superior endplates of T2, T5, U76 and T12. No significant bony retropulsion. These are chronic in appearance without associated marrow edema. Vertebral body heights otherwise  maintained. No evidence for acute or subacute fracture. Minimal reactive endplate changes noted about the posterior aspect of the T11-12 interspace. Signal intensity within the vertebral body bone marrow is otherwise normal. No discrete or worrisome osseous lesions. Cord:  Signal intensity within the thoracic spinal cord is normal. Paraspinal and other soft tissues: Paraspinous soft tissues within normal limits. Visualized visceral structures unremarkable. Partially visualized lungs are clear. Disc levels: No significant degenerative disc disease identified within knee thoracic spine. Posterior element facet degeneration noted at T10-11, greater on the left. Bony osteophytic spur arising from the left T10-11 facet encroaches upon the left posterior thecal sac (series 14, image 17). No significant stenosis. No other significant degenerative changes of note. No significant canal or foraminal stenosis. MRI LUMBAR SPINE FINDINGS Segmentation: Normal segmentation. Lowest well-formed disc is labeled the L5-S1 level. Alignment: Vertebral bodies normally aligned with preservation of the normal lumbar lordosis. Vertebrae: Chronic compression deformities involving the T11 and T12 vertebral bodies again noted. Vertebral body heights otherwise maintained. No other acute or chronic fracture identified. Chronic reactive endplate changes present about the L5-S1 interspace. Signal intensity within the vertebral body bone marrow otherwise normal. No discrete or worrisome osseous lesions. Conus medullaris: Extends to the L2 level and appears normal. Paraspinal and other soft tissues: Paraspinous soft tissues within normal limits. Visualized visceral structures unremarkable. Disc levels: No significant degenerative changes are seen through  the L2-3 level. L3-4: Normal interspace. Mild facet and ligamentum flavum hypertrophy. No stenosis. L4-5: Shallow left foraminal disc protrusion, closely approximating the exiting left L4 nerve root  (series 19, image 11). Moderate facet and ligamentum flavum hypertrophy. Moderate left L4 foraminal stenosis. No significant canal or right foraminal narrowing. L5-S1: Chronic degenerative intervertebral disc space narrowing with reactive endplate changes. Moderate facet and ligamentum flavum hypertrophy. No significant canal or lateral recess stenosis. Mild left with moderate right L5 foraminal narrowing. IMPRESSION: MRI CERVICAL SPINE IMPRESSION: 1. No acute abnormality within the cervical spine, with normal MRI appearance of the cervical spinal cord. 2. Mild degenerative disc bulge at C6-7 without stenosis. 3. Chronic wedging deformity of the C7 vertebral body. No acute fracture. 4. Small remote pontine lacunar infarct. MRI THORACIC SPINE IMPRESSION: 1. No acute abnormality within the thoracic spine, with normal appearance of the thoracic spinal cord. 2. Chronic wedging/compression deformities of the T2, T5, T11, and T12 vertebral bodies. 3. Left greater than right facet degeneration at T10-11 as above without stenosis. No other significant degenerative changes within the thoracic spine. MRI LUMBAR SPINE IMPRESSION: 1. No acute abnormality within the lumbar spine. 2. 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 3. Shallow left foraminal disc protrusion at L4-5, closely approximating the exiting left L4 nerve root and resulting in moderate left L4 foraminal stenosis. 4. Multilevel facet hypertrophy within the lower lumbar spine, greatest at L4-5 and L5-S1. Electronically Signed   By: Jeannine Boga M.D.   On: 02/10/2017 22:38   Mr Lumbar Spine Wo Contrast  Result Date: 02/10/2017 CLINICAL DATA:  Initial evaluation for right-sided pain for 1 week. Chronic low back pain. EXAM: MRI CERVICAL, THORACIC AND LUMBAR SPINE WITHOUT CONTRAST TECHNIQUE: Multiplanar and multiecho pulse sequences of the cervical spine, to include the craniocervical  junction and cervicothoracic junction, and thoracic and lumbar spine, were obtained without intravenous contrast. COMPARISON:  None. FINDINGS: MRI CERVICAL SPINE FINDINGS Alignment: Straightening of the normal cervical lordosis. No listhesis. Vertebrae: Chronic height loss with associated degenerative endplate Schmorl's node noted at the superior endplate of C7. Additional endplate Schmorl's node noted at the superior endplate of T2 with mild height loss, also chronic. Vertebral body heights otherwise maintained. No evidence for acute or subacute fracture. Signal intensity within the vertebral body bone marrow within normal limits. No discrete or worrisome osseous lesion. No abnormal marrow edema. Cord: Signal intensity within the cervical spinal cord is normal. Posterior Fossa, vertebral arteries, paraspinal tissues: Probable small remote lacunar infarct noted within the pons. Visualized brain otherwise grossly unremarkable. Craniocervical junction within normal limits. Paraspinous and prevertebral soft tissues within normal limits. Normal intravascular flow voids present within the vertebral arteries bilaterally. Disc levels: C2-C3: Unremarkable. C3-C4: Normal disc. Mild facet degeneration on the left. No stenosis. C4-C5:  Unremarkable. C5-C6: Normal disc. Mild left-sided facet degeneration. No stenosis. C6-C7: Mild circumferential disc bulge with uncovertebral spurring. No significant canal or foraminal stenosis. C7-T1:  Unremarkable. MRI THORACIC SPINE FINDINGS Alignment: Vertebral bodies normally aligned with preservation of the normal thoracic kyphosis. No listhesis. Vertebrae: Chronic height loss with superimposed degenerative Schmorl's nodes present at the superior endplates of T2, T5, M35 and T12. No significant bony retropulsion. These are chronic in appearance without associated marrow edema. Vertebral body heights otherwise maintained. No evidence for acute or subacute fracture. Minimal reactive endplate  changes noted about the posterior aspect of the T11-12 interspace. Signal intensity within the vertebral body bone marrow is otherwise normal.  No discrete or worrisome osseous lesions. Cord:  Signal intensity within the thoracic spinal cord is normal. Paraspinal and other soft tissues: Paraspinous soft tissues within normal limits. Visualized visceral structures unremarkable. Partially visualized lungs are clear. Disc levels: No significant degenerative disc disease identified within knee thoracic spine. Posterior element facet degeneration noted at T10-11, greater on the left. Bony osteophytic spur arising from the left T10-11 facet encroaches upon the left posterior thecal sac (series 14, image 17). No significant stenosis. No other significant degenerative changes of note. No significant canal or foraminal stenosis. MRI LUMBAR SPINE FINDINGS Segmentation: Normal segmentation. Lowest well-formed disc is labeled the L5-S1 level. Alignment: Vertebral bodies normally aligned with preservation of the normal lumbar lordosis. Vertebrae: Chronic compression deformities involving the T11 and T12 vertebral bodies again noted. Vertebral body heights otherwise maintained. No other acute or chronic fracture identified. Chronic reactive endplate changes present about the L5-S1 interspace. Signal intensity within the vertebral body bone marrow otherwise normal. No discrete or worrisome osseous lesions. Conus medullaris: Extends to the L2 level and appears normal. Paraspinal and other soft tissues: Paraspinous soft tissues within normal limits. Visualized visceral structures unremarkable. Disc levels: No significant degenerative changes are seen through the L2-3 level. L3-4: Normal interspace. Mild facet and ligamentum flavum hypertrophy. No stenosis. L4-5: Shallow left foraminal disc protrusion, closely approximating the exiting left L4 nerve root (series 19, image 11). Moderate facet and ligamentum flavum hypertrophy. Moderate  left L4 foraminal stenosis. No significant canal or right foraminal narrowing. L5-S1: Chronic degenerative intervertebral disc space narrowing with reactive endplate changes. Moderate facet and ligamentum flavum hypertrophy. No significant canal or lateral recess stenosis. Mild left with moderate right L5 foraminal narrowing. IMPRESSION: MRI CERVICAL SPINE IMPRESSION: 1. No acute abnormality within the cervical spine, with normal MRI appearance of the cervical spinal cord. 2. Mild degenerative disc bulge at C6-7 without stenosis. 3. Chronic wedging deformity of the C7 vertebral body. No acute fracture. 4. Small remote pontine lacunar infarct. MRI THORACIC SPINE IMPRESSION: 1. No acute abnormality within the thoracic spine, with normal appearance of the thoracic spinal cord. 2. Chronic wedging/compression deformities of the T2, T5, T11, and T12 vertebral bodies. 3. Left greater than right facet degeneration at T10-11 as above without stenosis. No other significant degenerative changes within the thoracic spine. MRI LUMBAR SPINE IMPRESSION: 1. No acute abnormality within the lumbar spine. 2. 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 3. Shallow left foraminal disc protrusion at L4-5, closely approximating the exiting left L4 nerve root and resulting in moderate left L4 foraminal stenosis. 4. Multilevel facet hypertrophy within the lower lumbar spine, greatest at L4-5 and L5-S1. Electronically Signed   By: Jeannine Boga M.D.   On: 02/10/2017 22:38   Mr Jodene Nam Head/brain [facility] Cm  Addendum Date: 03/02/2017   ADDENDUM REPORT: 03/02/2017 18:16 ADDENDUM: Critical Value/emergent results were called by telephone at the time of interpretation on 03/02/2017 at 6:15 pm to Dr. Linna Darner, who verbally acknowledged these results. Electronically Signed   By: Ulyses Jarred M.D.   On: 03/02/2017 18:16   Result Date: 03/02/2017 CLINICAL DATA:  Right-sided  weakness and numbness. EXAM: MRI HEAD WITHOUT CONTRAST MRA HEAD WITHOUT CONTRAST TECHNIQUE: Multiplanar, multiecho pulse sequences of the brain and surrounding structures were obtained without intravenous contrast. Angiographic images of the head were obtained using MRA technique without contrast. COMPARISON:  Head CT 03/02/2017 FINDINGS: MRI HEAD FINDINGS Brain: The midline structures are normal. There are multiple  areas of diffusion restriction, predominantly concentrated in the left frontal and parietal lobes and involving the left precentral gyrus and, to a lesser extent the postcentral and supra marginal gyri. There are smaller foci of diffusion restriction more inferiorly in the left frontal and parietal lobes. Additionally, there is diffusion restriction of the left caudate head. There is a single punctate focus of diffusion restriction within the anterolateral left temporal lobe. There are no contralateral foci of ischemia. There is no acute hemorrhage or mass effect. There are multiple old cerebellar infarcts. There are areas of hyperintense T2 weighted signal corresponding to the regions of restricted diffusion. There is also multifocal white matter hyperintensity suggesting chronic microvascular ischemia. No chronic microhemorrhage. There is multifocal hyperintense T2-weighted signal within the periventricular white matter, most often seen in the setting of chronic microvascular ischemia. No intraparenchymal hematoma or chronic microhemorrhage. Brain volume is normal for age without age-advanced or lobar predominant atrophy. The dura is normal and there is no extra-axial collection. Vascular: The left internal carotid artery flow void is abnormal at the skullbase. Skull and upper cervical spine: The visualized skull base, calvarium, upper cervical spine and extracranial soft tissues are normal. Sinuses/Orbits: No fluid levels or advanced mucosal thickening. No mastoid effusion. Normal orbits. MRA HEAD  FINDINGS Intracranial internal carotid arteries: There is near-total loss of flow-related enhancement within the left internal carotid artery, with minimal enhancement at the carotid terminus. There is no enhancement seen within the visualized cervical portion of the left ICA. The right internal carotid artery is normal. Anterior cerebral arteries: Normal. Middle cerebral arteries: There is diminished flow related enhancement within the left middle cerebral artery and its branches, but they remain patent. Posterior communicating arteries: Present bilaterally. Diminished enhancement on the left. Posterior cerebral arteries: Normal. Basilar artery: Normal. Vertebral arteries: Right-dominant. Normal. Superior cerebellar arteries: Normal. Anterior inferior cerebellar arteries: Normal. Posterior inferior cerebellar arteries: Normal. IMPRESSION: 1. Occlusion of the left internal carotid artery including the visualized cervical segment, with minimal enhancement in the carotid terminus due to collateral flow from the complete circle of Willis. 2. Multifocal acute left-sided infarct involving the left frontal, parietal and temporal lobes. The largest area of ischemia is within the left precentral gyrus. 3. Gyral edema without significant mass effect. No acute hemorrhage. 4. 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 I have paged Dyanne Carrel at 6:00 p.m. Electronically Signed: By: Ulyses Jarred M.D. On: 03/02/2017 18:03   Labs: Basic Metabolic Panel:  Recent Labs Lab 03/02/17 1159 03/04/17 0631  NA 138 140  K 4.2 3.7  CL 105 108  CO2 25 20*  GLUCOSE 133* 117*  BUN 9 7  CREATININE 0.73 0.56  CALCIUM 9.3 9.5  MG  --  1.8   Liver Function Tests:  Recent Labs Lab 03/02/17 1159  AST 25  ALT 27  ALKPHOS 74  BILITOT 0.6  PROT 7.2  ALBUMIN 4.0   CBC:  Recent Labs Lab 03/02/17 1159  WBC 8.7  NEUTROABS 6.0  HGB 15.7*  HCT 47.7*  MCV 92.1  PLT 244   Cardiac  Enzymes:  Recent Labs Lab 03/02/17 1159  CKTOTAL 184    Signed:  Barton Dubois MD.  [facility] 03/04/2017, 4:17 PM

## 2017-03-09 ENCOUNTER — Other Ambulatory Visit: Payer: Self-pay

## 2017-03-09 NOTE — Patient Outreach (Signed)
[facility]) Care Management  03/09/2017  Kelsey Wade 16-Jun-1961 [id number]   EMMI: Stroke Referral date: 03/09/17 Referral source: EMMI stroke red alert Referral reason: Questions/ problems with medications Day # 1 Stroke follow up and medication.  Insurance: Medicaid pending  PROVIDER:  Cammie Sickle, FNP  SOCIAL SUPPORT:  Patient lives with husband and uncle.   Telephone call to patient regarding EMMI stroke red alert. HIPAA verified with patient. Discussed EMMI stroke program with patient. Patient verbally agreed to ongoing calls with [facility] program.  Patient reports she has a good family support from her husband and uncle.  Patient states she has not received her Plavix yet. Patient states her plavix will not be in until Friday 03/11/17 for pick up. Patient states she has been taking her uncles plavix since being discharged from the hospital. Patient states they are on the same dosage. RNCM discouraged use of there peoples medications.  Advised patient that she could could contact her doctors office for samples. Patient states she is taking all of her other  Medications as prescribed. States she is able to afford them at this time.  RNCM discussed smoking cessation strategies  with patient. Patient states she uses the nicoderm patch off and on. RNCM advised patient to decrease the number of cigarettes she smokes per days. RNCM advised patient to set times during the day she will have a cigarette and use gum and or hard candy at other times of the day for cravings..  Patient verbalized understanding RMCM discussed with patient importance of monitoring her blood pressure and recording results. Advised to take list to her next doctors appointment. Patient confirmed she has a blood pressure monitor at home.  RNCM discussed signs and symptoms of stroke with patient. Advised to call 911 for stroke like symptoms.  Patient states [facility]. Patient confirms she has the contact phone number for [facility] is she has questions or concerns. Patient states she does not have the use of her right hand. Patient states her husband and uncle assist her with her daily activities/ routine.  Patient states she has a follow up appointment scheduled with her primary MD on 03/15/17.  Patient states she was not told to have a follow up visit with a neurologist. RNCM advised patient to ask her primary MD if he would be referring her to a neurologist for follow up. Also advised patient to write down questions she may have for her doctor and take questions with her to her appointment.  Patient verbally agreed to follow up call with RNCM within 1 week.  Patient request Advance directive packet.   Assessment: 56 y/o female with PMH significant for tobacco abuse, HTN, HLD, alcohol use disorder, depression and anxiety; Admit date: 03/02/2017 Discharge date: 03/04/2017 Discharge Diagnoses:  Principal Problem:   CVA (cerebrovascular accident) [facility]) Active Problems:   Essential hypertension   Tobacco dependence   Hyperlipidemia LDL goal <100   Chronic pain   ETOH abuse   Left carotid artery occlusion   PLAN: RNCM will follow up with patient within 1 week.  RNCM will mail patient Advance directive packet and EMMI education material on low salt diet, smoking cessation and stroke.

## 2017-03-11 ENCOUNTER — Other Ambulatory Visit: Payer: Self-pay

## 2017-03-11 NOTE — Patient Outreach (Signed)
[facility]) Care Management  03/11/2017  Sira Adsit 08-21-1961 [id number]  EMMI: Stroke Referral date: 03/11/17 Referral source: EMMI stroke red alert Referral reason: Feeling worse overall, New or worsening pain/ fever/ shortness of breath Day # 3 Stroke antiplatelets and anticoagulants. Insurance: Medicaid pending Attempt #1  PROVIDER:  Cammie Sickle, FNP  SOCIAL SUPPORT:  Patient lives with husband and uncle.   Telephone call to patient regarding EMMI stroke red alert.  Patient states she is unable to talk at this time due to home health therapist working with her.   PLAN; RNCM will attempt 2nd telephone call to patient within 3 business days.   Quinn Plowman RN,BSN,CCM [facility] Telephonic  [phone]

## 2017-03-14 NOTE — Telephone Encounter (Signed)
This encounter was created in error - please disregard.

## 2017-03-15 ENCOUNTER — Ambulatory Visit (INDEPENDENT_AMBULATORY_CARE_PROVIDER_SITE_OTHER): Payer: Self-pay | Admitting: Family Medicine

## 2017-03-15 ENCOUNTER — Encounter: Payer: Self-pay | Admitting: Family Medicine

## 2017-03-15 VITALS — BP 123/89 | HR 85 | Temp 98.1°F | Resp 16 | Ht 64.0 in | Wt 135.0 lb

## 2017-03-15 DIAGNOSIS — G8929 Other chronic pain: Secondary | ICD-10-CM

## 2017-03-15 DIAGNOSIS — M5441 Lumbago with sciatica, right side: Secondary | ICD-10-CM

## 2017-03-15 DIAGNOSIS — I6930 Unspecified sequelae of cerebral infarction: Secondary | ICD-10-CM

## 2017-03-15 DIAGNOSIS — G629 Polyneuropathy, unspecified: Secondary | ICD-10-CM

## 2017-03-15 DIAGNOSIS — F419 Anxiety disorder, unspecified: Secondary | ICD-10-CM

## 2017-03-15 DIAGNOSIS — F17200 Nicotine dependence, unspecified, uncomplicated: Secondary | ICD-10-CM

## 2017-03-15 MED ORDER — BUSPIRONE HCL 10 MG PO TABS: 10.0000 mg | ORAL_TABLET | Freq: Two times a day (BID) | ORAL | 5 refills | Status: DC

## 2017-03-15 MED ORDER — DULOXETINE HCL 20 MG PO CPEP: 20.0000 mg | ORAL_CAPSULE | Freq: Two times a day (BID) | ORAL | 5 refills | Status: DC

## 2017-03-15 MED ORDER — GABAPENTIN 400 MG PO CAPS: 400.0000 mg | ORAL_CAPSULE | Freq: Three times a day (TID) | ORAL | 2 refills | Status: DC

## 2017-03-15 NOTE — Patient Instructions (Addendum)
1. Anxiety Continue Buspar 10 mg twice daily  Cymbalta 20 mg twice daily  Will warrant a referral to psychiatry  2. Chronic pain Warrants a referral to pain management; awaiting a payer source  3 Right thigh injury:  Apply cool compresses 20 minutes 4 times and use heat interchangeably.  Recommend Tylenol 500 mg every 6 hours as needed  4. Neuropathy: Gabapentin 400 mg three times per day  5. CVA:  Continue Plavix 75 mg daily and atorvastatin 40 mg   Discussed the importance of smoking cessation. Continue to reduce daily cigarette smoking

## 2017-03-15 NOTE — Progress Notes (Signed)
[name], a 56 year old female with a history of chronic back pain and depression presents for a post hospital follow-up . She was in the emergency department on 03/02/2017 and right sided weakness was noted. She reported that she had worsening right sided weakness for 3 days. Head CT showed a small acute infarct in the posterior left frontal lobe involving the precentral gyrus. She also had an acute infarct involving the left caudate head. She has multiple remote appearing small infarcts in the bilateral cerebrum and cerebellum. She was admitted with a CVA. Her risk factor include hypertension and she has been a tobacco user for greater than 40 years. She was discharged on daily Plavix 75 mg, aspirin, and is continuing statin therapy. She has residual right side weakness and is undergoing home physical and occupational therapy.   She also has a history of chronic back pain. She had a MRI of the lumbar, thoracic, and cervical spine. She was found to have mild degenerative disc bulge at C6-7, chronic wedging deformity of the C7 vertebral body and a small  remote pontine lucunar infarct, and chronic wedging deformities throughout the thoracic spine. She is currently under the care of an orthopedic specialist.   Symptoms have been present for greater than 1 year and include numbness in lower extremities.  Exacerbating factors identifiable by patient are bending forwards, bending sideways, recumbency, standing and walking. She has been taking anti-inflammatory medications without relief. Current pain intensity is 8/10 and is described as shooting and constant.   Past Medical History:  Diagnosis Date  . Chronic back pain   . ETOH abuse   . Hyperlipidemia   . Stroke ([place])   . Tobacco abuse    Social History   Social History  . Marital status: Divorced    Spouse name: N/A  . Number of children: N/A  . Years of education: N/A   Occupational History  . Not on file.   Social History Main  Topics  . Smoking status: Current Every Day Smoker    Packs/day: 0.50    Types: Cigarettes  . Smokeless tobacco: Current User  . Alcohol use Yes     Comment: Occasionally   . Drug use: Yes    Types: Marijuana     Comment: daily   . Sexual activity: Not on file   Other Topics Concern  . Not on file   Social History Narrative  . No narrative on file   Immunization History  Administered Date(s) Administered  . Pneumococcal Polysaccharide-23 11/12/2016  . Tdap 11/12/2016   Review of Systems  Constitutional: Positive for malaise/fatigue.  HENT: Negative.   Eyes: Negative.   Respiratory: Negative.   Gastrointestinal: Negative.   Genitourinary: Negative.   Musculoskeletal: Positive for back pain and myalgias.  Skin: Negative.   Neurological: Negative.   Psychiatric/Behavioral: The patient is nervous/anxious.    Physical Exam  Constitutional: She is well-developed, well-nourished, and in no distress.  HENT:  Head: Normocephalic and atraumatic.  Right Ear: External ear normal.  Eyes: Conjunctivae are normal. Pupils are equal, round, and reactive to light.  Neck: Normal range of motion. Neck supple.  Cardiovascular: Normal rate, regular rhythm, normal heart sounds and intact distal pulses.   Pulmonary/Chest: Effort normal and breath sounds normal.  Abdominal: Soft. Bowel sounds are normal.  Musculoskeletal:       Lumbar back: She exhibits decreased range of motion, tenderness, pain and spasm. She exhibits no swelling and no edema.  Skin: Skin  is warm and dry.  Psychiatric: Mood, memory, affect and judgment normal.  BP 123/89 (BP Location: Left Arm, Patient Position: Sitting, Cuff Size: Normal)   Pulse 85   Temp 98.1 F (36.7 C) (Oral)   Resp 16   Ht 5\' 4"  (1.626 m)   Wt 135 lb (61.2 kg)   SpO2 98%   BMI 23.17 kg/m   Plan   1. Chronic right-sided low back pain with right-sided sciatica Kelsey Wade continues to complain of low back pain. She is scheduled to follow  up with orthopedic specialist. I will increase gabapentin. She is requesting opiate medications. I will not prescribe opiate medications at this juncture. I will send a referral to pain management as payer source becomes available. I increased gabapentin 400 mg four times per day as needed.   - gabapentin (NEURONTIN) 400 MG capsule; Take 1 capsule (400 mg total) by mouth 3 (three) times daily.  Dispense: 90 capsule; Refill: 2  2. Neuropathy  - gabapentin (NEURONTIN) 400 MG capsule; Take 1 capsule (400 mg total) by mouth 3 (three) times daily.  Dispense: 90 capsule; Refill: 2  3. Anxiety Will continue anxiety medication.  She says that constant worrying has worsened since stroke. She refuses psychiatry referral at this time.   - busPIRone (BUSPAR) 10 MG tablet; Take 1 tablet (10 mg total) by mouth 2 (two) times daily.  Dispense: 60 tablet; Refill: 5 - DULoxetine (CYMBALTA) 20 MG capsule; Take 1 capsule (20 mg total) by mouth 2 (two) times daily.  Dispense: 60 capsule; Refill: 5  4. History of CVA with residual deficit Kelsey Wade has residual right side weakness. She will continue physical and occupational therapy.  Plavix 75 mg daily and Atorvastatin 40 mg.  Discussed the importance of smoking cessation.   5. Tobacco dependence Smoking cessation instruction/counseling given:  counseled patient on the dangers of tobacco use, advised patient to stop smoking, and reviewed strategies to maximize success     RTC: Return to office on 04/08/2017 for labs. Follow up in office in 3 months   [place]  MSN, FNP-C [place] [address] [place], [place] [address] [phone]  .

## 2017-03-16 ENCOUNTER — Other Ambulatory Visit: Payer: Self-pay

## 2017-03-16 ENCOUNTER — Ambulatory Visit: Payer: Self-pay

## 2017-03-16 NOTE — Patient Outreach (Signed)
[facility]) Care Management  03/16/2017  Kelsey Wade 1960/12/06 [id number]  EMMI: Stroke Referral date: 03/09/17 Referral source: EMMI stroke red alert Referral reason: Questions/ problems with medications Day # 1 Stroke follow up and medication.  Insurance: Medicaid pending  PROVIDER:  Cammie Sickle, FNP  SOCIAL SUPPORT:  Patient lives with husband and uncle.   Third telephone call to patient. Patient states she is unable to talk due to home health therapy arriving soon. RNCM offered to leave call back phone number with patient. Patient states she has it.    PLAN; RNCM will refer patient to care management assistant to close due to being unable to maintain contact with patient.   Quinn Plowman RN,BSN,CCM [facility] Telephonic  [phone]

## 2017-03-21 ENCOUNTER — Ambulatory Visit: Payer: Self-pay | Admitting: Family Medicine

## 2017-04-08 ENCOUNTER — Other Ambulatory Visit: Payer: Self-pay

## 2017-04-12 ENCOUNTER — Other Ambulatory Visit (INDEPENDENT_AMBULATORY_CARE_PROVIDER_SITE_OTHER): Payer: Self-pay

## 2017-04-12 ENCOUNTER — Other Ambulatory Visit: Payer: Self-pay | Admitting: Family Medicine

## 2017-04-12 DIAGNOSIS — R748 Abnormal levels of other serum enzymes: Secondary | ICD-10-CM

## 2017-04-12 LAB — HEPATIC FUNCTION PANEL
ALT: 11 U/L (ref 6–29)
AST: 15 U/L (ref 10–35)
Albumin: 4.4 g/dL (ref 3.6–5.1)
Alkaline Phosphatase: 87 U/L (ref 33–130)
BILIRUBIN TOTAL: 0.4 mg/dL (ref 0.2–1.2)
Bilirubin, Direct: 0.1 mg/dL (ref <=0.2)
Indirect Bilirubin: 0.3 mg/dL (ref 0.2–1.2)
Total Protein: 7.3 g/dL (ref 6.1–8.1)

## 2017-04-15 ENCOUNTER — Encounter: Payer: Self-pay | Admitting: Family Medicine

## 2017-04-15 ENCOUNTER — Ambulatory Visit (INDEPENDENT_AMBULATORY_CARE_PROVIDER_SITE_OTHER): Payer: Self-pay | Admitting: Family Medicine

## 2017-04-15 VITALS — BP 128/87 | HR 92 | Temp 98.2°F | Resp 16 | Ht 64.0 in | Wt 134.0 lb

## 2017-04-15 DIAGNOSIS — M79641 Pain in right hand: Secondary | ICD-10-CM

## 2017-04-15 DIAGNOSIS — F17200 Nicotine dependence, unspecified, uncomplicated: Secondary | ICD-10-CM

## 2017-04-15 MED ORDER — KETOROLAC TROMETHAMINE 60 MG/2ML IM SOLN
60.0000 mg | Freq: Once | INTRAMUSCULAR | Status: AC
  Administered 2017-04-15: 60 mg via INTRAMUSCULAR

## 2017-04-15 MED ORDER — ACETAMINOPHEN-CODEINE #3 300-30 MG PO TABS
1.0000 | ORAL_TABLET | Freq: Four times a day (QID) | ORAL | 0 refills | Status: DC | PRN
Start: 2017-04-15 — End: 2017-05-16

## 2017-04-15 NOTE — Patient Instructions (Signed)
Hand Pain  Many things can cause hand pain. Some common causes are:  ? An injury.  ? Repeating the same movement with your hand over and over (overuse).  ? Osteoporosis.  ? Arthritis.  ? Lumps in the tendons or joints of the hand and wrist (ganglion cysts).  ? Infection.  Follow these instructions at home:  Pay attention to any changes in your symptoms. Take these actions to help with your discomfort:  ? If directed, put ice on the affected area:  ? Put ice in a plastic bag.  ? Place a towel between your skin and the bag.  ? Leave the ice on for 15?20 minutes, 3?4 times a day for 2 days.  ? Take over-the-counter and prescription medicines only as told by your health care provider.  ? Minimize stress on your hands and wrists as much as possible.  ? Take breaks from repetitive activity often.  ? Do stretches as told by your health care provider.  ? Do not do activities that make your pain worse.  Contact a health care provider if:  ? Your pain does not get better after a few days of self-care.  ? Your pain gets worse.  ? Your pain affects your ability to do your daily activities.  Get help right away if:  ? Your hand becomes warm, red, or swollen.  ? Your hand is numb or tingling.  ? Your hand is extremely swollen or deformed.  ? Your hand or fingers turn white or blue.  ? You cannot move your hand, wrist, or fingers.  This information is not intended to replace advice given to you by your health care provider. Make sure you discuss any questions you have with your health care provider.  Document Released: 10/03/2015 Document Revised: 02/12/2016 Document Reviewed: 10/02/2014  Elsevier Interactive Patient Education ? 2018 Elsevier Inc.

## 2017-04-19 NOTE — Progress Notes (Signed)
Subjective:    Kelsey Wade is a 56 y.o. female who presents for evaluation of right hand pain. Kelsey Wade had a CVA on 03/02/2017, with right residual weakness. She has been undergoing home physical therapy. She noticed an increase in swelling and pain to right hand following physical therapy. She rates pain as 8/10 described as constant and throbbing. Pain worsens with movement. She has not identified any provocative factors to relieve pain.  Past Medical History:  Diagnosis Date  . Chronic back pain   . ETOH abuse   . Hyperlipidemia   . Stroke ([place])   . Tobacco abuse    Social History   Social History  . Marital status: Divorced    Spouse name: N/A  . Number of children: N/A  . Years of education: N/A   Occupational History  . Not on file.   Social History Main Topics  . Smoking status: Current Every Day Smoker    Packs/day: 0.50    Types: Cigarettes  . Smokeless tobacco: Current User     Comment: less  . Alcohol use Yes     Comment: Occasionally   . Drug use: Yes    Types: Marijuana     Comment: daily   . Sexual activity: Not on file   Other Topics Concern  . Not on file   Social History Narrative  . No narrative on file   Review of Systems Review of Systems  Constitutional: Positive for malaise/fatigue.  HENT: Negative.   Eyes: Negative.   Respiratory: Negative.   Cardiovascular: Negative for chest pain, palpitations and leg swelling.  Gastrointestinal: Negative.   Musculoskeletal: Positive for myalgias (right hand pain).  Skin: Negative.   Neurological: Positive for tingling and focal weakness.  Psychiatric/Behavioral: Negative.       Objective:    BP 128/87 (BP Location: Left Arm, Patient Position: Sitting, Cuff Size: Normal)   Pulse 92   Temp 98.2 F (36.8 C) (Oral)   Resp 16   Ht 5\' 4"  (1.626 m)   Wt 134 lb (60.8 kg)   SpO2 98%   BMI 23.00 kg/m     Physical Exam  Constitutional: She is oriented to person, place, and time and  well-developed, well-nourished, and in no distress.  Eyes: Pupils are equal, round, and reactive to light.  Neck: Normal range of motion.  Cardiovascular: Normal rate, regular rhythm and normal heart sounds.   Pulmonary/Chest: Effort normal and breath sounds normal.  Abdominal: Soft. Bowel sounds are normal.  Musculoskeletal:       Right wrist: She exhibits decreased range of motion, tenderness and swelling.  soft tissue tenderness and swelling at the right forarm and MCP joints, radial pulse normal and sensation normal  Neurological: She is oriented to person, place, and time. She displays weakness. Gait abnormal.  Ambulating with cane    Assessment:    Plan:  1. Right hand pain Natural history and expected course discussed. Questions answered. Scientist, clinical (histocompatibility and immunogenetics) distributed. Rest, ice, compression, and elevation (RICE) therapy. Reduction in offending activity discussed. OTC analgesics as needed. Reviewed [place] Substance Reporting system prior to prescribing opiate medications. No inconsistencies noted.    - acetaminophen-codeine (TYLENOL #3) 300-30 MG tablet; Take 1 tablet by mouth every 6 (six) hours as needed for moderate pain.  Dispense: 30 tablet; Refill: 0 - ketorolac (TORADOL) injection 60 mg; Inject 2 mLs (60 mg total) into the muscle once.  2. Tobacco dependence Smoking cessation instruction/counseling given:  counseled patient on the dangers of  tobacco use, advised patient to stop smoking, and reviewed strategies to maximize success    RTC: As previously scheduled   Donia Pounds  MSN, FNP-C [address] [place], [place] [address] [phone]

## 2017-05-10 ENCOUNTER — Ambulatory Visit (INDEPENDENT_AMBULATORY_CARE_PROVIDER_SITE_OTHER): Payer: Self-pay | Admitting: Orthopaedic Surgery

## 2017-05-11 ENCOUNTER — Other Ambulatory Visit: Payer: Self-pay | Admitting: Family Medicine

## 2017-05-11 ENCOUNTER — Telehealth: Payer: Self-pay

## 2017-05-11 DIAGNOSIS — R531 Weakness: Secondary | ICD-10-CM

## 2017-05-11 DIAGNOSIS — I63032 Cerebral infarction due to thrombosis of left carotid artery: Secondary | ICD-10-CM

## 2017-05-11 NOTE — Telephone Encounter (Signed)
Called, no answer. Left message for patient to call back. Thanks!  

## 2017-05-13 ENCOUNTER — Telehealth: Payer: Self-pay

## 2017-05-13 DIAGNOSIS — M79641 Pain in right hand: Secondary | ICD-10-CM

## 2017-05-13 NOTE — Telephone Encounter (Signed)
Refill request for Tylenol #3. LOV 04/15/2017. Please advise. Thanks!

## 2017-05-16 ENCOUNTER — Other Ambulatory Visit: Payer: Self-pay | Admitting: Family Medicine

## 2017-05-16 MED ORDER — ACETAMINOPHEN-CODEINE #3 300-30 MG PO TABS: 1.0000 | ORAL_TABLET | Freq: Four times a day (QID) | ORAL | 0 refills | Status: DC | PRN

## 2017-05-16 NOTE — Progress Notes (Signed)
Refilled Tylenol #3 , qty 15 tablets. Patient will need to follow-up with her PCP to be evaluated to obtain additional refills. Reviewed [place].  Carroll Sage. Kenton Kingfisher, MSN, FNP-C [facility]  [address] [place], [place] [address] [phone]

## 2017-05-16 NOTE — Telephone Encounter (Signed)
Maudie Mercury, Can you refill this for patient? Please advise. She wants this faxed to [facility] outpatient. Thanks!

## 2017-06-06 ENCOUNTER — Telehealth: Payer: Self-pay

## 2017-06-06 NOTE — Telephone Encounter (Signed)
Patient is requesting a refill on Tylenol #3. Please advise. Maudie Mercury did fill this for her for 2 days worth in your absence on 05/16/2017. Please advise. Thanks!

## 2017-06-10 ENCOUNTER — Ambulatory Visit: Payer: Medicaid Other | Attending: Family Medicine | Admitting: Physical Therapy

## 2017-06-10 ENCOUNTER — Telehealth: Payer: Self-pay | Admitting: Physical Therapy

## 2017-06-10 ENCOUNTER — Encounter: Payer: Self-pay | Admitting: Physical Therapy

## 2017-06-10 ENCOUNTER — Ambulatory Visit (INDEPENDENT_AMBULATORY_CARE_PROVIDER_SITE_OTHER): Payer: Medicaid Other | Admitting: Family Medicine

## 2017-06-10 VITALS — BP 133/84 | HR 78

## 2017-06-10 VITALS — BP 125/87 | HR 83 | Temp 98.2°F | Resp 16 | Ht 64.0 in | Wt 126.0 lb

## 2017-06-10 DIAGNOSIS — Z1231 Encounter for screening mammogram for malignant neoplasm of breast: Secondary | ICD-10-CM | POA: Diagnosis not present

## 2017-06-10 DIAGNOSIS — M6281 Muscle weakness (generalized): Secondary | ICD-10-CM | POA: Diagnosis present

## 2017-06-10 DIAGNOSIS — F419 Anxiety disorder, unspecified: Secondary | ICD-10-CM | POA: Diagnosis not present

## 2017-06-10 DIAGNOSIS — M25511 Pain in right shoulder: Secondary | ICD-10-CM | POA: Diagnosis present

## 2017-06-10 DIAGNOSIS — Z23 Encounter for immunization: Secondary | ICD-10-CM | POA: Diagnosis not present

## 2017-06-10 DIAGNOSIS — G894 Chronic pain syndrome: Secondary | ICD-10-CM | POA: Diagnosis not present

## 2017-06-10 DIAGNOSIS — I69351 Hemiplegia and hemiparesis following cerebral infarction affecting right dominant side: Secondary | ICD-10-CM | POA: Insufficient documentation

## 2017-06-10 DIAGNOSIS — F17200 Nicotine dependence, unspecified, uncomplicated: Secondary | ICD-10-CM

## 2017-06-10 DIAGNOSIS — M79641 Pain in right hand: Secondary | ICD-10-CM

## 2017-06-10 DIAGNOSIS — Z1239 Encounter for other screening for malignant neoplasm of breast: Secondary | ICD-10-CM

## 2017-06-10 DIAGNOSIS — F32A Depression, unspecified: Secondary | ICD-10-CM

## 2017-06-10 DIAGNOSIS — R262 Difficulty in walking, not elsewhere classified: Secondary | ICD-10-CM | POA: Diagnosis present

## 2017-06-10 DIAGNOSIS — F411 Generalized anxiety disorder: Secondary | ICD-10-CM | POA: Diagnosis not present

## 2017-06-10 DIAGNOSIS — F329 Major depressive disorder, single episode, unspecified: Secondary | ICD-10-CM

## 2017-06-10 DIAGNOSIS — I10 Essential (primary) hypertension: Secondary | ICD-10-CM

## 2017-06-10 LAB — COMPLETE METABOLIC PANEL WITH GFR
AG Ratio: 1.5 (calc) (ref 1.0–2.5)
ALBUMIN MSPROF: 4.5 g/dL (ref 3.6–5.1)
ALT: 9 U/L (ref 6–29)
AST: 13 U/L (ref 10–35)
Alkaline phosphatase (APISO): 79 U/L (ref 33–130)
BILIRUBIN TOTAL: 0.4 mg/dL (ref 0.2–1.2)
BUN: 22 mg/dL (ref 7–25)
CALCIUM: 9.7 mg/dL (ref 8.6–10.4)
CHLORIDE: 106 mmol/L (ref 98–110)
CO2: 24 mmol/L (ref 20–32)
CREATININE: 0.88 mg/dL (ref 0.50–1.05)
GFR, EST AFRICAN AMERICAN: 86 mL/min/{1.73_m2} (ref >=60)
GFR, EST NON AFRICAN AMERICAN: 74 mL/min/{1.73_m2} (ref >=60)
GLUCOSE: 105 mg/dL — AB (ref 65–99)
Globulin: 3 g/dL (calc) (ref 1.9–3.7)
Potassium: 4.3 mmol/L (ref 3.5–5.3)
Sodium: 139 mmol/L (ref 135–146)
TOTAL PROTEIN: 7.5 g/dL (ref 6.1–8.1)

## 2017-06-10 MED ORDER — KETOROLAC TROMETHAMINE 60 MG/2ML IM SOLN
60.0000 mg | Freq: Once | INTRAMUSCULAR | Status: AC
  Administered 2017-06-10: 60 mg via INTRAMUSCULAR

## 2017-06-10 MED ORDER — ACETAMINOPHEN-CODEINE #3 300-30 MG PO TABS: 1.0000 | ORAL_TABLET | Freq: Four times a day (QID) | ORAL | 0 refills | Status: DC | PRN

## 2017-06-10 MED ORDER — DULOXETINE HCL 20 MG PO CPEP: 20.0000 mg | ORAL_CAPSULE | Freq: Two times a day (BID) | ORAL | 5 refills | Status: DC

## 2017-06-10 NOTE — Patient Instructions (Signed)
I have sent a referral to pain management for chronic pain syndrome.   I have also sent a referral to psychiatry for anxiety and depression  Living With Anxiety After being diagnosed with an anxiety disorder, you may be relieved to know why you have felt or behaved a certain way. It is natural to also feel overwhelmed about the treatment ahead and what it will mean for your life. With care and support, you can manage this condition and recover from it. How to cope with anxiety Dealing with stress Stress is your body's reaction to life changes and events, both good and bad. Stress can last just a few hours or it can be ongoing. Stress can play a major role in anxiety, so it is important to learn both how to cope with stress and how to think about it differently. Talk with your health care provider or a counselor to learn more about stress reduction. He or she may suggest some stress reduction techniques, such as:  Music therapy. This can include creating or listening to music that you enjoy and that inspires you.  Mindfulness-based meditation. This involves being aware of your normal breaths, rather than trying to control your breathing. It can be done while sitting or walking.  Centering prayer. This is a kind of meditation that involves focusing on a word, phrase, or sacred image that is meaningful to you and that brings you peace.  Deep breathing. To do this, expand your stomach and inhale slowly through your nose. Hold your breath for 3-5 seconds. Then exhale slowly, allowing your stomach muscles to relax.  Self-talk. This is a skill where you identify thought patterns that lead to anxiety reactions and correct those thoughts.  Muscle relaxation. This involves tensing muscles then relaxing them.  Choose a stress reduction technique that fits your lifestyle and personality. Stress reduction techniques take time and practice. Set aside 5-15 minutes a day to do them. Therapists can offer  training in these techniques. The training may be covered by some insurance plans. Other things you can do to manage stress include:  Keeping a stress diary. This can help you learn what triggers your stress and ways to control your response.  Thinking about how you respond to certain situations. You may not be able to control everything, but you can control your reaction.  Making time for activities that help you relax, and not feeling guilty about spending your time in this way.  Therapy combined with coping and stress-reduction skills provides the best chance for successful treatment. Medicines Medicines can help ease symptoms. Medicines for anxiety include:  Anti-anxiety drugs.  Antidepressants.  Beta-blockers.  Medicines may be used as the main treatment for anxiety disorder, along with therapy, or if other treatments are not working. Medicines should be prescribed by a health care provider. Relationships Relationships can play a big part in helping you recover. Try to spend more time connecting with trusted friends and family members. Consider going to couples counseling, taking family education classes, or going to family therapy. Therapy can help you and others better understand the condition. How to recognize changes in your condition Everyone has a different response to treatment for anxiety. Recovery from anxiety happens when symptoms decrease and stop interfering with your daily activities at home or work. This may mean that you will start to:  Have better concentration and focus.  Sleep better.  Be less irritable.  Have more energy.  Have improved memory.  It is important to recognize  when your condition is getting worse. Contact your health care provider if your symptoms interfere with home or work and you do not feel like your condition is improving. Where to find help and support: You can get help and support from these sources:  Self-help groups.  Online and  [employer].  A trusted spiritual leader.  Couples counseling.  Family education classes.  Family therapy.  Follow these instructions at home:  Eat a healthy diet that includes plenty of vegetables, fruits, whole grains, low-fat dairy products, and lean protein. Do not eat a lot of foods that are high in solid fats, added sugars, or salt.  Exercise. Most adults should do the following: ? Exercise for at least 150 minutes each week. The exercise should increase your heart rate and make you sweat (moderate-intensity exercise). ? Strengthening exercises at least twice a week.  Cut down on caffeine, tobacco, alcohol, and other potentially harmful substances.  Get the right amount and quality of sleep. Most adults need 7-9 hours of sleep each night.  Make choices that simplify your life.  Take over-the-counter and prescription medicines only as told by your health care provider.  Avoid caffeine, alcohol, and certain over-the-counter cold medicines. These may make you feel worse. Ask your pharmacist which medicines to avoid.  Keep all follow-up visits as told by your health care provider. This is important. Questions to ask your health care provider  Would I benefit from therapy?  How often should I follow up with a health care provider?  How long do I need to take medicine?  Are there any long-term side effects of my medicine?  Are there any alternatives to taking medicine? Contact a health care provider if:  You have a hard time staying focused or finishing daily tasks.  You spend many hours a day feeling worried about everyday life.  You become exhausted by worry.  You start to have headaches, feel tense, or have nausea.  You urinate more than normal.  You have diarrhea. Get help right away if:  You have a racing heart and shortness of breath.  You have thoughts of hurting yourself or others. If you ever feel like you may hurt yourself or others, or  have thoughts about taking your own life, get help right away. You can go to your nearest emergency department or call:  Your local emergency services (911 in the [place]  A suicide crisis helpline, such as the [facility] at (602) 091-4565. This is open 24-hours a day.  Summary  Taking steps to deal with stress can help calm you.  Medicines cannot cure anxiety disorders, but they can help ease symptoms.  Family, friends, and partners can play a big part in helping you recover from an anxiety disorder. This information is not intended to replace advice given to you by your health care provider. Make sure you discuss any questions you have with your health care provider. Document Released: 08/31/2016 Document Revised: 08/31/2016 Document Reviewed: 08/31/2016 Elsevier Interactive Patient Education  [employer].

## 2017-06-10 NOTE — Telephone Encounter (Signed)
[name] Robert FNP;  I did PT eval today for Ms Kelsey Wade. She will benefit from OT as well for her right UE deficits.  Please submit an order in EPIC  Kind regards Eden Emms PT DPT

## 2017-06-10 NOTE — Therapy (Signed)
[facility] [address] [place], [place], [address] Phone: [phone]   Fax:  [phone]  Physical Therapy Evaluation  Patient Details  Name: Kelsey Wade MRN: [id number] Date of Birth: 03/08/1961 Referring Provider: Dorena Dew FNP  Encounter Date: 06/10/2017      PT End of Session - 06/10/17 1719    Visit Number 1   Number of Visits 9   Date for PT Re-Evaluation 07/15/17   Authorization Type Medicaid authorization pending   PT Start Time 1400   PT Stop Time 0160   PT Time Calculation (min) 45 min   Activity Tolerance Patient tolerated treatment well;Patient limited by pain      Past Medical History:  Diagnosis Date  . Chronic back pain   . ETOH abuse   . Hyperlipidemia   . Stroke ([place])   . Tobacco abuse     Past Surgical History:  Procedure Laterality Date  . APPENDECTOMY      Vitals:   06/10/17 1430  BP: 133/84  Pulse: 78         Subjective Assessment - 06/10/17 1639    Subjective Pt stated she had a stroke in June R side affected ; stated she did have home health for about a month PT and OT ; but no tx since then ; she is keeping her right arm in a sling; she is having a lot of pain in the shoulder and no functional use of the right arm ; she is right handed; she reports multi falls; her leg was worse but it is improving-shes using cane and walker intermittenly;     Pertinent History CVA Right side affected    Limitations Writing;House hold activities;Walking;Lifting   Patient Stated Goals get her arm and hand working better; less shoulder pain; improve her balance and walking             [facility] PT Assessment - 06/10/17 0001      Assessment   Medical Diagnosis right side affected cerebral infarct unspecified   Referring Provider Dorena Dew FNP   Onset Date/Surgical Date 03/02/17   Hand Dominance Right   Prior Therapy hhPT and OT     Precautions   Precautions  Fall;Shoulder;Cervical   Type of Shoulder Precautions hemiplegia/pain     Restrictions   Weight Bearing Restrictions No     Balance Screen   Has the patient fallen in the past 6 months Yes   How many times? multi   Has the patient had a decrease in activity level because of a fear of falling?  Yes   Is the patient reluctant to leave their home because of a fear of falling?  Yes     [place] residence   Home Access Stairs to enter   Entrance Stairs-Number of Steps 3   Entrance Stairs-Rails [place] One level     Prior Function   Level of Independence Independent     Cognition   Overall Cognitive Status Within Functional Limits for tasks assessed     Sensation   Light Touch Impaired by gross assessment   Proprioception Impaired by gross assessment     Coordination   Gross Motor Movements are Fluid and Coordinated No   Fine Motor Movements are Fluid and Coordinated No   Coordination and Movement Description poor control of right UE and hand      Functional Tests   Functional tests  Sit to Stand     Sit to Stand   Comments able to do w/o UE support     Posture/Postural Control   Posture/Postural Control No significant limitations     Tone   Assessment Location Right Upper Extremity     ROM / Strength   AROM / PROM / Strength Strength     Strength   Strength Assessment Site Shoulder;Hip   Right/Left Shoulder Right   Right Shoulder Flexion 2-/5   Right Shoulder Extension 2-/5   Right Shoulder ABduction 2-/5   Right/Left Hip Right   Right Hip Flexion 4+/5   Right Hip ABduction 3/5     Transfers   Transfers Sit to Stand   Sit to Stand 5: Supervision     Ambulation/Gait   Ambulation/Gait Yes   Ambulation Distance (Feet) 115 Feet   Assistive device None   Gait Pattern Step-through pattern   Ambulation Surface Level   Gait velocity 2.107ft/sec   Stairs Yes   Stair Management Technique One rail Left   Number  of Stairs 4     Standardized Balance Assessment   Standardized Balance Assessment Berg Balance Test;Dynamic Gait Index     Berg Balance Test   Sit to Stand Able to stand without using hands and stabilize independently   Standing Unsupported Able to stand safely 2 minutes   Sitting with Back Unsupported but Feet Supported on Floor or Stool Able to sit safely and securely 2 minutes   Stand to Sit Controls descent by using hands   Transfers Able to transfer safely, definite need of hands   Standing Unsupported with Eyes Closed Able to stand 10 seconds with supervision   Standing Ubsupported with Feet Together Able to place feet together independently and stand for 1 minute with supervision   From Standing, Reach Forward with Outstretched Arm Can reach confidently >25 cm (10")   From Standing Position, Pick up Object from Floor Able to pick up shoe, needs supervision   From Standing Position, Turn to Look Behind Over each Shoulder Looks behind from both sides and weight shifts well   Turn 360 Degrees Able to turn 360 degrees safely one side only in 4 seconds or less   Standing Unsupported, Alternately Place Feet on Step/Stool Able to stand independently and complete 8 steps >20 seconds   Standing Unsupported, One Foot in Front Able to take small step independently and hold 30 seconds   Standing on One Leg Able to lift leg independently and hold equal to or more than 3 seconds   Total Score 45     Dynamic Gait Index   Level Surface Normal   Change in Gait Speed Normal   Gait with Horizontal Head Turns Normal   Gait with Vertical Head Turns Normal   Gait and Pivot Turn Mild Impairment   Step Over Obstacle Mild Impairment   Step Around Obstacles Normal   Steps Mild Impairment   Total Score 21     Functional Gait  Assessment   Gait assessed  No     RUE Tone   RUE Tone Hypertonic     RUE Tone   Hypertonic Details poor             Objective measurements completed on  examination: See above findings.                  PT Education - 06/10/17 1717    Education provided Yes   Education Details explained use of  mirror box therapy and how to simply build a home unit she can use; also how to use full length mirror to match her right and left arm posture;  dc use of arm sling and work on pain free AAROM    Person(s) Educated Patient   Methods Explanation;Demonstration   Comprehension Verbalized understanding             PT Long Term Goals - 06/10/17 1727      PT LONG TERM GOAL #1   Title [place] able to walk at 48ft /sec w/o assisted device on uneven surface ( sidwalk/curbs) for safe community ambulation - no loss of balance    Baseline pt walking indoors level surface 2.5 ft/sec   Time 4   Period Weeks   Status New     PT LONG TERM GOAL #2   Title Patient will demo low fall risk evident by BERG at >54/56 ;  FSST  < 12 sec and DGI above 24  so pt is safe with transfers, and she has normalized step reflex.    Baseline DGI and BERG                 Plan - 06/10/17 1646    Clinical Impression Statement  Clinical Impression from Cammie Sickle " Ms. Vashon Arch, a 56 year old female with a history of chronic back pain and depression presents for a post hospital follow-up . She was in the emergency department on 03/02/2017 and right sided weakness was noted. She reported that she had worsening right sided weakness for 3 days. Head CT showed a small acute infarct in the posterior left frontal lobe involving the precentral gyrus. She also had an acute infarct involving the left caudate head. She has multiple remote appearing small infarcts in the bilateral cerebrum and cerebellum. She was admitted with a CVA. Her risk factor include hypertension and she has been a tobacco user for greater than 40 years.  She has residual right side weakness. "   She was very cooperative today; he main deficits are her right arm and shoulder; she scored  well on DGI and is at a low-mod fall risk level; Should be albe to fully correct her balance and gait deificits;  we will incorporated UE into PT ex's but she should have OT to work on the fine motor and func mvt of the UE; Pt dx is cerbral infarct unspecified  I63.9;     History and Personal Factors relevant to plan of care: chronic back pain and hx of cervical disc bulge ; right handed; she did not present with any cognitive or speech/swallowing issues that would indicated need for OT   Clinical Presentation Stable   Rehab Potential Excellent   PT Frequency 2x / week   PT Duration 4 weeks   PT Treatment/Interventions ADLs/Self Care Home Management;Gait training;Functional mobility training;Therapeutic exercise;Therapeutic activities;Neuromuscular re-education;Balance training;Passive range of motion;Manual techniques;Patient/family education   PT Next Visit Plan see if her uncle was able to make a mirror box for her; review this tx for HEP   PT Home Exercise Plan standing posture exs;  stand in front of a mirror and try to get bil UE in a symmetric postion; use of mirror for feedback    Recommended Other Services OT   Consulted and Agree with Plan of Care Patient      Patient will benefit from skilled therapeutic intervention in order to improve the following deficits and impairments:  Decreased endurance, Impaired sensation,  Impaired tone, Impaired UE functional use, Pain, Decreased strength, Difficulty walking, Decreased balance, Decreased range of motion, Decreased coordination, Impaired flexibility  Visit Diagnosis: Hemiplegia and hemiparesis following cerebral infarction affecting right dominant side (HCC)  Difficulty in walking, not elsewhere classified  Muscle weakness (generalized)  Acute pain of right shoulder     Problem List Patient Active Problem List   Diagnosis Date Noted  . Left carotid artery occlusion   . CVA (cerebrovascular accident) ([place]) 03/02/2017  . Chronic  pain 03/02/2017  . ETOH abuse 03/02/2017  . Ischemic stroke ([place])   . Hyperlipidemia LDL goal <100 11/15/2016  . Essential hypertension 11/12/2016  . Neuropathy 11/12/2016  . Tobacco dependence 11/12/2016    Rosaura Carpenter D PT DPT 06/10/2017, 5:36 PM  [facility] [address] [place], [place], [address] Phone: [phone]   Fax:  [phone]  Name: Kelsey Wade MRN: [id number] Date of Birth: 06-Oct-1960

## 2017-06-10 NOTE — Progress Notes (Signed)
[name], a 56 year old female with a history of chronic back pain and depression presents accompanied by husband for a follow up of chronic conditions. She continues to have right upper extremity deficits following a CVA in June 2018 . She reported that she had worsening right sided weakness over the past several months. Head CT showed a small acute infarct in the posterior left frontal lobe involving the precentral gyrus. She also had an acute infarct involving the left caudate head. She has multiple remote appearing small infarcts in the bilateral cerebrum and cerebellum. She was admitted with a CVA. Her risk factor include hypertension and she has been a tobacco user for greater than 40 years. She was discharged on daily Plavix 75 mg, aspirin, and is continuing statin therapy. She has residual right side weakness and has an appointment to resume physical therapy this afternoon.   She also has a history of chronic back pain. She continues to complain of severe pain. She has been referred to pain management in the past, but has not been able to schedule appointment due to lack of payer source. She had a MRI of the lumbar, thoracic, and cervical spine. She was found to have mild degenerative disc bulge at C6-7, chronic wedging deformity of the C7 vertebral body and a small  remote pontine lucunar infarct, and chronic wedging deformities throughout the thoracic spine. She is currently under the care of an orthopedic specialist.   Symptoms have been present for greater than 1 year and include numbness in lower extremities.  Exacerbating factors identifiable by patient are bending forwards, bending sideways, recumbency, standing and walking. She has been taking anti-inflammatory and medications without relief. Current pain intensity is 10/10 and is described as shooting and constant.   Past Medical History:  Diagnosis Date  . Chronic back pain   . ETOH abuse   . Hyperlipidemia   . Stroke  ([place])   . Tobacco abuse    Social History   Social History  . Marital status: Divorced    Spouse name: N/A  . Number of children: N/A  . Years of education: N/A   Occupational History  . Not on file.   Social History Main Topics  . Smoking status: Current Every Day Smoker    Packs/day: 0.50    Types: Cigarettes  . Smokeless tobacco: Current User     Comment: less  . Alcohol use Yes     Comment: Occasionally   . Drug use: Yes    Types: Marijuana     Comment: daily   . Sexual activity: Not on file   Other Topics Concern  . Not on file   Social History Narrative  . No narrative on file   Immunization History  Administered Date(s) Administered  . Pneumococcal Polysaccharide-23 11/12/2016  . Tdap 11/12/2016   Review of Systems  Constitutional: Positive for malaise/fatigue.  HENT: Negative.   Eyes: Negative.   Respiratory: Negative.   Gastrointestinal: Negative.   Genitourinary: Negative.   Musculoskeletal: Positive for back pain and myalgias.  Skin: Negative.   Neurological: Negative.   Psychiatric/Behavioral: The patient is nervous/anxious.    Physical Exam  Constitutional: She is well-developed, well-nourished, and in no distress.  HENT:  Head: Normocephalic and atraumatic.  Right Ear: External ear normal.  Eyes: Pupils are equal, round, and reactive to light. Conjunctivae are normal.  Neck: Normal range of motion. Neck supple.  Cardiovascular: Normal rate, regular rhythm, normal heart sounds and intact distal  pulses.   Pulmonary/Chest: Effort normal and breath sounds normal.  Abdominal: Soft. Bowel sounds are normal.  Musculoskeletal:       Lumbar back: She exhibits decreased range of motion, tenderness, pain and spasm. She exhibits no swelling and no edema.  Skin: Skin is warm and dry.  Psychiatric: Mood, memory, affect and judgment normal.  BP 125/87 (BP Location: Left Arm, Patient Position: Sitting, Cuff Size: Normal)   Pulse 83   Temp 98.2 F (36.8  C) (Oral)   Resp 16   Ht 5\' 4"  (1.626 m)   Wt 126 lb (57.2 kg)   SpO2 98%   BMI 21.63 kg/m   Plan   1. Generalized anxiety disorder GAD 7 : Generalized Anxiety Score 06/10/2017  Nervous, Anxious, on Edge 3  Control/stop worrying 3  Worry too much - different things 3  Trouble relaxing 3  Easily annoyed or irritable 3  Anxiety Difficulty Very difficult  Will continue Buspar as previously prescribed. Patient denies suicidal or homicidal ideations.  - Ambulatory referral to Psychiatry  2. Depression, unspecified depression type Depression screen [facility] 2/9 06/10/2017 06/10/2017 04/15/2017 03/15/2017 02/17/2017  Decreased Interest 2 0 0 1 1  Down, Depressed, Hopeless 3 3 3 1 1   PHQ - 2 Score 5 3 3 2 2   Altered sleeping 3 3 3  - -  Tired, decreased energy 3 3 3  - -  Change in appetite - 0 0 - -  Feeling bad or failure about yourself  3 3 3  - -  Trouble concentrating 3 3 3  - -  Moving slowly or fidgety/restless 3 3 3  - -  Suicidal thoughts - 0 0 - -  PHQ-9 Score 20 18 18  - -  Difficult doing work/chores - - Not difficult at all - -    - Ambulatory referral to Psychiatry  3. Anxiety - DULoxetine (CYMBALTA) 20 MG capsule; Take 1 capsule (20 mg total) by mouth 2 (two) times daily.  Dispense: 60 capsule; Refill: 5  4. Right hand pain Will resend referral to pain management. Reminded Kelsey Wade that I do not treat chronic pain in this clinic. However, I will write a short Rx for Tylenol #3 every 6 hours as needed #30.  Reviewed [place] Substance Reporting system prior to prescribing opiate medications. No inconsistencies noted.    - acetaminophen-codeine (TYLENOL #3) 300-30 MG tablet; Take 1 tablet by mouth every 6 (six) hours as needed for moderate pain.  Dispense: 30 tablet; Refill: 0 - Ambulatory referral to Pain Clinic - ketorolac (TORADOL) injection 60 mg; Inject 2 mLs (60 mg total) into the muscle once.  5. Chronic pain syndrome - Ambulatory referral to Pain Clinic  6. Screening  for breast cancer  - MM Digital Screening; Future  7. Tobacco dependence Smoking cessation instruction/counseling given:  counseled patient on the dangers of tobacco use, advised patient to stop smoking, and reviewed strategies to maximize success   8. Essential hypertension Blood pressure is at goal on current medication regimen Continue medication, monitor blood pressure at home. Continue DASH diet. Reminder to go to the ER if any CP, SOB, nausea, dizziness, severe HA, changes vision/speech, left arm numbness and tingling and jaw pain. - COMPLETE METABOLIC PANEL WITH GFR  9. Influenza vaccination given - Flu Vaccine QUAD 6+ mos PF IM (Fluarix Quad PF)  RTC: 3 months for pap smear   Donia Pounds  MSN, FNP-C [facility] [address] [place], [place] [address]  [phone]  

## 2017-06-14 ENCOUNTER — Encounter: Payer: Self-pay | Admitting: Family Medicine

## 2017-06-14 ENCOUNTER — Other Ambulatory Visit: Payer: Self-pay | Admitting: Family Medicine

## 2017-06-14 DIAGNOSIS — I6930 Unspecified sequelae of cerebral infarction: Secondary | ICD-10-CM

## 2017-06-15 ENCOUNTER — Encounter: Payer: Self-pay | Admitting: Physical Therapy

## 2017-06-15 ENCOUNTER — Ambulatory Visit: Payer: Medicaid Other | Admitting: Physical Therapy

## 2017-06-15 DIAGNOSIS — M6281 Muscle weakness (generalized): Secondary | ICD-10-CM

## 2017-06-15 DIAGNOSIS — I69351 Hemiplegia and hemiparesis following cerebral infarction affecting right dominant side: Secondary | ICD-10-CM

## 2017-06-15 DIAGNOSIS — M25511 Pain in right shoulder: Secondary | ICD-10-CM

## 2017-06-15 DIAGNOSIS — R262 Difficulty in walking, not elsewhere classified: Secondary | ICD-10-CM

## 2017-06-15 NOTE — Therapy (Signed)
[facility] [address] [place], [place], [address] Phone: [phone]   Fax:  [phone]  Physical Therapy Treatment  Patient Details  Name: Kelsey Wade MRN: [id number] Date of Birth: [birth year] Referring Provider: Dorena Dew FNP  Encounter Date: 06/15/2017      PT End of Session - 06/15/17 1256    Visit Number 2   Number of Visits 4  eval + 3 visits   Date for PT Re-Evaluation 07/15/17   Authorization Type CCME approved 3 visits from 06/15/17 through 07/14/17.  Pt also looking into getting approved for hospital billing on orange card   Authorization Time Period 06/15/17 through 07/14/17   Authorization - Visit Number 1   Authorization - Number of Visits 3   PT Start Time 1109   PT Stop Time 1155   PT Time Calculation (min) 46 min   Activity Tolerance Patient tolerated treatment well   Behavior During Therapy WFL for tasks assessed/performed      Past Medical History:  Diagnosis Date  . Chronic back pain   . ETOH abuse   . Hyperlipidemia   . Stroke ([place])   . Tobacco abuse     Past Surgical History:  Procedure Laterality Date  . APPENDECTOMY      There were no vitals filed for this visit.      Subjective Assessment - 06/15/17 1117    Subjective Pt not wearing sling today.  Reports pain in R shoulder when trying to lift UE.  Wears sling to support UE and shoulder; mostly at night when she sleeps.   Pertinent History bilateral MCA infarcts, bifrontal, L parietal, L caudate head CVA, embolic, R hemiplegia. Chronic cerebellar infarct.  Old C7, T2 and T5 compression fx, multiple falls.  Chronic back pain, ETOH use, Tobacco abuse.   Limitations Writing;House hold activities;Walking;Lifting   Patient Stated Goals get her arm and hand working better; less shoulder pain; improve her balance and walking    Currently in Pain? Yes   Pain Score 9    Pain Location Arm   Pain Orientation Right   Pain  Descriptors / Indicators Sore   Pain Type Chronic pain   Pain Onset More than a month ago            [facility] PT Assessment - 06/15/17 2057      Observation/Other Assessments   Focus on Therapeutic Outcomes (FOTO)  25 (75% limited; predicted 53% limitation)   Other Surveys  Other Surveys   Neuro Quality of Life  LE: 35.8                     [facility] Adult PT Treatment/Exercise - 06/15/17 1122      Self-Care   Self-Care Other Self-Care Comments   Other Self-Care Comments  Provided pt with handout of RUE and RLE positioning in the bed supine, R sidelying and L sidelying and had pt lie in each position and reviewed placement of RUE, RLE and use of pillows.  Pt reports that she spends all day in bed and even eats in bed; reviewed how to support RUE in sitting on 4 pillows to bring shoulder into neutral position and allow elbow to rest in extension and supination for contracture prevention.  Pt reports the main falls she has had were getting out of the tub shower due to not having anything to hold to-reports they tried suction cup grab bars but they fell off.  Recommended that pt  have family member install grab bars that screw into wall to provide pt with greater stability and safety.  Due to increased edema in RUE reviewed retrograde massage for edema management.     Exercises   Exercises Shoulder     Shoulder Exercises: Seated   Retraction AAROM;Right;Left;5 reps;Other (comment)  with RUE supported on 4 pillows                PT Education - 06/15/17 1255    Education provided Yes   Education Details retrograde massage, UE positioning in bed for pain management, maintain ROM and manage edema.  Recommendation for grab bars, scapular activation for stability   Person(s) Educated Patient;Other (comment)  Orland Penman   Methods Explanation;Demonstration;Handout   Comprehension Verbalized understanding             PT Long Term Goals - 06/15/17 1258      PT LONG  TERM GOAL #1   Title Pt will decrease falls risk for community mobility as indicated by increase in gait velocity to > or = 3.61ft/sec   Baseline pt walking indoors level surface 2.5 ft/sec   Time 4   Period Weeks   Status Revised   Target Date 07/14/17     PT LONG TERM GOAL #2   Title Patient will demo low fall risk evident by BERG at > or = 53/56    Baseline BERG 45/56   Status Revised   Target Date 07/14/17     PT LONG TERM GOAL #3   Title DGI >22 to decrease risk for falls during gait   Baseline 21/24   Status Revised   Target Date 07/14/17     PT LONG TERM GOAL #4   Title Pt will demonstrate independence with RUE positioning in sitting and supine and will be independent with HEP   Baseline needs total verbal and visual cues    Status New   Target Date 07/14/17     PT LONG TERM GOAL #5   Title Pt will improve Neuro QOL: LE by 8 points   Baseline 35.8   Status New   Target Date 07/14/17               Plan - 06/15/17 1301    Clinical Impression Statement Treatment session today addressed safety at home and recommendations for modifications, edema management and positioning of UE in bed and initiation of scapular stability and strengthening to provide pt with improved pain management and shoulder stability without use of UE sling.  Pt reports she and her husband stay in the bed most of the day and eat all meals in the bed because it is more "comfortable".  Will need to discuss importance of regular OOB activity and HEP and effects of sedentary lifestyle.  Pt also advised to contact orange card for further financial assistance for increased # of visits due to significant impairments.  Will continue to discuss.   Rehab Potential Excellent   PT Frequency 1x / week   PT Treatment/Interventions ADLs/Self Care Home Management;Gait training;Functional mobility training;Therapeutic exercise;Therapeutic activities;Neuromuscular re-education;Balance training;Passive range of  motion;Manual techniques;Patient/family education   PT Next Visit Plan visits?  scapular stabilizers, balance, HEP!!  Stepping into and out of tub   PT Home Exercise Plan standing posture exs;  stand in front of a mirror and try to get bil UE in a symmetric postion; use of mirror for feedback    Recommended Other Services OT?   Consulted and Agree with Plan  of Care Patient;Family member/caregiver      Patient will benefit from skilled therapeutic intervention in order to improve the following deficits and impairments:  Decreased endurance, Impaired sensation, Impaired tone, Impaired UE functional use, Pain, Decreased strength, Difficulty walking, Decreased balance, Decreased range of motion, Decreased coordination, Impaired flexibility  Visit Diagnosis: Hemiplegia and hemiparesis following cerebral infarction affecting right dominant side (HCC)  Difficulty in walking, not elsewhere classified  Muscle weakness (generalized)  Acute pain of right shoulder     Problem List Patient Active Problem List   Diagnosis Date Noted  . Left carotid artery occlusion   . CVA (cerebrovascular accident) ([place]) 03/02/2017  . Chronic pain 03/02/2017  . ETOH abuse 03/02/2017  . Ischemic stroke ([place])   . Hyperlipidemia LDL goal <100 11/15/2016  . Essential hypertension 11/12/2016  . Neuropathy 11/12/2016  . Tobacco dependence 11/12/2016    Raylene Everts, PT, DPT 06/15/17    9:04 PM     [facility] [address] [place], [place], [address] Phone: [phone]   Fax:  [phone]  Name: Kelsey Wade MRN: [id number] Date of Birth: 09/15/1961

## 2017-06-17 ENCOUNTER — Ambulatory Visit: Payer: Medicaid Other | Admitting: Physical Therapy

## 2017-06-17 ENCOUNTER — Ambulatory Visit: Payer: Self-pay | Admitting: Family Medicine

## 2017-06-22 ENCOUNTER — Ambulatory Visit: Payer: Medicaid Other | Admitting: Physical Therapy

## 2017-06-23 ENCOUNTER — Ambulatory Visit
Admission: RE | Admit: 2017-06-23 | Discharge: 2017-06-23 | Disposition: A | Discharge status: Home / Self Care | Payer: Medicaid Other | Source: Ambulatory Visit | Attending: Family Medicine | Admitting: Family Medicine

## 2017-06-23 DIAGNOSIS — Z1239 Encounter for other screening for malignant neoplasm of breast: Secondary | ICD-10-CM

## 2017-06-24 ENCOUNTER — Ambulatory Visit: Payer: Medicaid Other | Admitting: Physical Therapy

## 2017-06-24 ENCOUNTER — Ambulatory Visit: Payer: Medicaid Other | Attending: Family Medicine | Admitting: Physical Therapy

## 2017-06-24 DIAGNOSIS — G8929 Other chronic pain: Secondary | ICD-10-CM | POA: Insufficient documentation

## 2017-06-24 DIAGNOSIS — M545 Low back pain: Secondary | ICD-10-CM | POA: Insufficient documentation

## 2017-06-24 DIAGNOSIS — M62838 Other muscle spasm: Secondary | ICD-10-CM | POA: Diagnosis present

## 2017-06-24 DIAGNOSIS — R262 Difficulty in walking, not elsewhere classified: Secondary | ICD-10-CM | POA: Insufficient documentation

## 2017-06-24 DIAGNOSIS — M25611 Stiffness of right shoulder, not elsewhere classified: Secondary | ICD-10-CM | POA: Insufficient documentation

## 2017-06-24 DIAGNOSIS — M6281 Muscle weakness (generalized): Secondary | ICD-10-CM | POA: Insufficient documentation

## 2017-06-24 DIAGNOSIS — I69351 Hemiplegia and hemiparesis following cerebral infarction affecting right dominant side: Secondary | ICD-10-CM | POA: Insufficient documentation

## 2017-06-24 DIAGNOSIS — M25511 Pain in right shoulder: Secondary | ICD-10-CM | POA: Diagnosis present

## 2017-06-24 NOTE — Therapy (Signed)
[facility] [address] [place], [place], [address] Phone: [phone]   Fax:  [phone]  Physical Therapy Treatment  Patient Details  Name: Kelsey Wade MRN: [id number] Date of Birth: [birth year] Referring Provider: Dorena Dew FNP  Encounter Date: 06/24/2017      PT End of Session - 06/24/17 1427    Visit Number 3   Number of Visits 4   Date for PT Re-Evaluation 07/15/17   Authorization Type CCME approved 3 visits from 06/15/17 through 07/14/17.  Pt also looking into getting approved for hospital billing on orange card   Authorization Time Period 06/15/17 through 07/14/17   PT Start Time 1316   PT Stop Time 1410   PT Time Calculation (min) 54 min   Activity Tolerance Patient limited by pain   Behavior During Therapy [facility] for tasks assessed/performed      Past Medical History:  Diagnosis Date  . Chronic back pain   . ETOH abuse   . Hyperlipidemia   . Stroke ([place])   . Tobacco abuse     Past Surgical History:  Procedure Laterality Date  . APPENDECTOMY      There were no vitals filed for this visit.      Subjective Assessment - 06/24/17 1423    Currently in Pain? Yes   Pain Score 9    Pain Location Shoulder   Pain Orientation Right   Pain Descriptors / Indicators Sharp;Sore   Pain Type Chronic pain   Pain Onset More than a month ago   Pain Frequency Constant   Aggravating Factors  immobility then trying to move it    Pain Relieving Factors keeping it still; massage              THERE EX;  Sit to stand; hands clasped together and reaching outside base of support ; once standing raise arm overhead as far as possible; x 10  Supine ;  Bridges,  SLR , Hip ABD   X 20 each side ;  AAROM of right UE in supine and seated; multi bouts w/edcuation on how to manage edema with mvt.  Review of safety with walking with quad cane                      PT Education - 06/24/17  1425    Education provided Yes   Education Details handouts ; see HEP ; focus on UE edema manangement and AAROM of right UE and  basic Lower leg AROM with focus on core stabilzation;  also review use of mirror box and how to build one for home; saftey with ambulation as well; using quad cane   Person(s) Educated Patient;Other (comment)   Methods Explanation;Demonstration;Tactile cues;Verbal cues;Handout   Comprehension Verbalized understanding;Returned demonstration             PT Long Term Goals - 06/15/17 1258      PT LONG TERM GOAL #1   Title Pt will decrease falls risk for community mobility as indicated by increase in gait velocity to > or = 3.41ft/sec   Baseline pt walking indoors level surface 2.5 ft/sec   Time 4   Period Weeks   Status Revised   Target Date 07/14/17     PT LONG TERM GOAL #2   Title Patient will demo low fall risk evident by BERG at > or = 53/56    Baseline BERG 45/56   Status Revised   Target Date 07/14/17  PT LONG TERM GOAL #3   Title DGI >22 to decrease risk for falls during gait   Baseline 21/24   Status Revised   Target Date 07/14/17     PT LONG TERM GOAL #4   Title Pt will demonstrate independence with RUE positioning in sitting and supine and will be independent with HEP   Baseline needs total verbal and visual cues    Status New   Target Date 07/14/17     PT LONG TERM GOAL #5   Title Pt will improve Neuro QOL: LE by 8 points   Baseline 35.8   Status New   Target Date 07/14/17               Plan - 06/24/17 1428    Clinical Impression Statement Cont to address safety with walking and edema management; she has marked increase in right hand edema; emphasized need for consistencty with R UE stimualtion and recommended use of mirror box ; stated she needs to do multi reps of HEP throughout the day and that her right shoulder pain will worsen if she does not begin to work on [employer] consistently ; challenged her to do the HEP so that  by next visit her hand edema would only be minimal  and her shoulder pain improved;  she is walking safely with the quad cane but  needs improved core and hip strength ; this should help with scissoring gait pattern and improve her gait ;     History and Personal Factors relevant to plan of care: addendum to last comment ; correct she does not need SLP   she does need OT for UE deficts    Clinical Presentation Stable   Rehab Potential Good   PT Frequency 1x / week   PT Duration 4 weeks   PT Treatment/Interventions ADLs/Self Care Home Management;Gait training;Functional mobility training;Therapeutic exercise;Therapeutic activities;Neuromuscular re-education;Balance training;Passive range of motion;Manual techniques;Patient/family education   PT Next Visit Plan with eval and 3 approved it should be last approved; if possible seek more; could refer to [employer] [facility] at [place] edema management; shoulder AAROM;  LE and core strength   Consulted and Agree with Plan of Care Patient      Patient will benefit from skilled therapeutic intervention in order to improve the following deficits and impairments:  Decreased balance, Impaired UE functional use, Decreased strength, Decreased mobility, Difficulty walking, Impaired flexibility, Improper body mechanics, Impaired tone, Impaired sensation, Increased edema, Decreased activity tolerance, Decreased endurance  Visit Diagnosis: Hemiplegia and hemiparesis following cerebral infarction affecting right dominant side (HCC)  Difficulty in walking, not elsewhere classified  Muscle weakness (generalized)  Acute pain of right shoulder     Problem List Patient Active Problem List   Diagnosis Date Noted  . Left carotid artery occlusion   . CVA (cerebrovascular accident) ([place]) 03/02/2017  . Chronic pain 03/02/2017  . ETOH abuse 03/02/2017  . Ischemic stroke ([place])   . Hyperlipidemia LDL goal <100 11/15/2016  . Essential hypertension  11/12/2016  . Neuropathy 11/12/2016  . Tobacco dependence 11/12/2016    Rosaura Carpenter D  PT DPT 06/24/2017, 2:44 PM  [facility] [address] [place], [place], [address] Phone: [phone]   Fax:  [phone]  Name: Olevia Westervelt MRN: [id number] Date of Birth: 08-Feb-1961

## 2017-06-24 NOTE — Patient Instructions (Addendum)
Upper Arm and Shoulder Blade Stretch    Elbow bent, foreman toward chest, press uninvolved hand on back of involved upper arm stretching shoulder blade, and straighten elbow. Repeat ___ times. Do ___ times per day.  Copyright  VHI. All rights reserved.  ELBOW: Palm Turn    Hold involved arm at side, thumb up, elbow bent 90. Turn hand palm up then palm down. Repeat ___ times. Do ___ times per day.  Copyright  VHI. All rights reserved.  FINGERS: Spread    Spread fingers and thumb of involved hand apart like a fan and then bring together. Repeat ___ times. Do ___ times per day.  Copyright  VHI. All rights reserved.  FINGERS: Table Top    Keeping fingers straight, bend knuckles to 90, (flat as a table top). Repeat ___ times. Do ___ times per day.  Copyright  VHI. All rights reserved.  WRIST: Backward and Forward - Thumb Up    Hold involved arm at side, elbow at 90, and thumb up. Bring fisted hand toward body and away. Repeat ___ times. Do ___ times per day.  Copyright  VHI. All rights reserved.  ELBOW: Bend and Straighten    Sit or stand with involved arm straight and palm facing forward. Bend forearm to shoulder. Repeat ___ times. Do ___ times per day. Do ___ times with ___ lb dumbbell.  Copyright  VHI. All rights reserved.  Straight Leg Raise: With External Leg Rotation    Lie on back with right leg straight, opposite leg bent. Rotate straight leg out and lift ____ inches. Repeat ____ times per set. Do ____ sets per session. Do ____ sessions per day.  http://orth.exer.us/728   Copyright  VHI. All rights reserved.  Hip Abduction / Adduction: with Extended Knee (Supine)    Bring left leg out to side and return. Keep knee straight. Repeat ____ times per set. Do ____ sets per session. Do ____ sessions per day.  http://orth.exer.us/680   Copyright  VHI. All rights reserved.  ROM: Flexion    Keeping left arm on table, slide body away until  stretch is felt. Hold ____ seconds. Repeat ____ times per set. Do ____ sets per session. Do ____ sessions per day.  http://orth.exer.us/756   Copyright  VHI. All rights reserved.  ROM: Pendulum (Circular)    Let right arm move in circle clockwise, then counterclockwise, by rocking body weight in circular pattern. Circle ____ times each direction per set. Do ____ sets per session. Do ____ sessions per day.  http://orth.exer.us/794   Copyright  VHI. All rights reserved.  ROM: Flexion (Standing)    Bring arms straight out in front and raise as high as possible without pain. Keep palms facing up. Repeat ____ times per set. Do ____ sets per session. Do ____ sessions per day.  http://orth.exer.us/908   Copyright  VHI. All rights reserved.  ROM: Flexion (Standing)   LYING ON YOUR BACK CLASP HANDS TOGETHER AND RAISE ARMS OVERHEAD Bring arms straight out in front and raise as high as possible without pain. Keep palms facing up. Repeat ____ times per set. Do ____ sets per session. Do ____ sessions per day.  http://orth.exer.us/908   Copyright  VHI. All rights reserved.

## 2017-06-29 ENCOUNTER — Ambulatory Visit: Payer: Medicaid Other | Admitting: Physical Therapy

## 2017-07-01 ENCOUNTER — Ambulatory Visit: Payer: Medicaid Other | Admitting: Physical Therapy

## 2017-07-01 ENCOUNTER — Ambulatory Visit: Payer: Medicaid Other | Admitting: Occupational Therapy

## 2017-07-04 ENCOUNTER — Ambulatory Visit: Payer: Medicaid Other | Admitting: Physical Therapy

## 2017-07-05 ENCOUNTER — Other Ambulatory Visit: Payer: Self-pay
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
## 2017-07-06 ENCOUNTER — Ambulatory Visit: Payer: Medicaid Other | Admitting: Physical Therapy

## 2017-07-13 ENCOUNTER — Encounter: Payer: Self-pay | Admitting: Physical Therapy

## 2017-07-13 ENCOUNTER — Ambulatory Visit: Payer: Medicaid Other | Admitting: Physical Therapy

## 2017-07-13 DIAGNOSIS — M25511 Pain in right shoulder: Secondary | ICD-10-CM

## 2017-07-13 DIAGNOSIS — G8929 Other chronic pain: Secondary | ICD-10-CM

## 2017-07-13 DIAGNOSIS — M545 Low back pain, unspecified: Secondary | ICD-10-CM

## 2017-07-13 DIAGNOSIS — M25611 Stiffness of right shoulder, not elsewhere classified: Secondary | ICD-10-CM

## 2017-07-13 DIAGNOSIS — I69351 Hemiplegia and hemiparesis following cerebral infarction affecting right dominant side: Secondary | ICD-10-CM | POA: Diagnosis not present

## 2017-07-13 DIAGNOSIS — M62838 Other muscle spasm: Secondary | ICD-10-CM

## 2017-07-14 ENCOUNTER — Encounter: Payer: Self-pay | Admitting: Physical Therapy

## 2017-07-14 ENCOUNTER — Other Ambulatory Visit: Payer: Self-pay | Admitting: Family Medicine

## 2017-07-14 DIAGNOSIS — G8929 Other chronic pain: Secondary | ICD-10-CM

## 2017-07-14 DIAGNOSIS — G629 Polyneuropathy, unspecified: Secondary | ICD-10-CM

## 2017-07-14 DIAGNOSIS — M5441 Lumbago with sciatica, right side: Principal | ICD-10-CM
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
## 2017-07-21 ENCOUNTER — Ambulatory Visit: Payer: Medicaid Other | Attending: Family Medicine | Admitting: Occupational Therapy

## 2017-07-21 DIAGNOSIS — I69351 Hemiplegia and hemiparesis following cerebral infarction affecting right dominant side: Secondary | ICD-10-CM | POA: Diagnosis present

## 2017-07-21 DIAGNOSIS — I69318 Other symptoms and signs involving cognitive functions following cerebral infarction: Secondary | ICD-10-CM

## 2017-07-21 DIAGNOSIS — G8929 Other chronic pain: Secondary | ICD-10-CM | POA: Insufficient documentation

## 2017-07-21 DIAGNOSIS — R278 Other lack of coordination: Secondary | ICD-10-CM | POA: Insufficient documentation

## 2017-07-21 DIAGNOSIS — M25511 Pain in right shoulder: Secondary | ICD-10-CM | POA: Insufficient documentation
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
## 2017-07-27 ENCOUNTER — Ambulatory Visit: Payer: Medicaid Other | Admitting: Physical Therapy

## 2017-07-29 ENCOUNTER — Other Ambulatory Visit: Payer: Self-pay | Admitting: Family Medicine

## 2017-07-29 DIAGNOSIS — K219 Gastro-esophageal reflux disease without esophagitis: Secondary | ICD-10-CM

## 2017-08-03 ENCOUNTER — Encounter: Payer: Medicaid Other | Admitting: Physical Therapy

## 2017-08-04 ENCOUNTER — Ambulatory Visit: Payer: Medicaid Other | Admitting: Occupational Therapy

## 2017-08-08 ENCOUNTER — Telehealth: Payer: Self-pay

## 2017-08-08 ENCOUNTER — Ambulatory Visit: Payer: Medicaid Other | Admitting: Occupational Therapy

## 2017-08-08 NOTE — Telephone Encounter (Signed)
Occupational therapist called patient and left message re: 2 missed appointments and reminder of next scheduled appointment. Medicaid has approved visits. Patient given contact info and requested call back to our clinic.

## 2017-08-10 ENCOUNTER — Encounter: Payer: Self-pay | Admitting: Family Medicine

## 2017-08-10 ENCOUNTER — Other Ambulatory Visit (HOSPITAL_COMMUNITY)
Admission: RE | Admit: 2017-08-10 | Discharge: 2017-08-10 | Disposition: A | Discharge status: Home / Self Care | Payer: Medicaid Other | Source: Ambulatory Visit | Attending: Family Medicine | Admitting: Family Medicine

## 2017-08-10 ENCOUNTER — Encounter: Payer: Self-pay | Admitting: Physician Assistant

## 2017-08-10 ENCOUNTER — Ambulatory Visit (INDEPENDENT_AMBULATORY_CARE_PROVIDER_SITE_OTHER): Payer: Medicaid Other | Admitting: Family Medicine

## 2017-08-10 ENCOUNTER — Encounter: Payer: Medicaid Other | Admitting: Physical Therapy

## 2017-08-10 VITALS — BP 123/77 | HR 78 | Temp 98.5°F | Resp 16 | Ht 64.0 in | Wt 129.0 lb

## 2017-08-10 DIAGNOSIS — Z01419 Encounter for gynecological examination (general) (routine) without abnormal findings: Secondary | ICD-10-CM | POA: Diagnosis present

## 2017-08-10 DIAGNOSIS — F17200 Nicotine dependence, unspecified, uncomplicated: Secondary | ICD-10-CM | POA: Diagnosis not present

## 2017-08-10 DIAGNOSIS — Z1211 Encounter for screening for malignant neoplasm of colon: Secondary | ICD-10-CM

## 2017-08-10 LAB — POCT URINALYSIS DIP (DEVICE)
Glucose, UA: NEGATIVE mg/dL
Hgb urine dipstick: NEGATIVE
KETONES UR: NEGATIVE mg/dL
Leukocytes, UA: NEGATIVE
Nitrite: NEGATIVE
PH: 5.5 (ref 5.0–8.0)
Protein, ur: NEGATIVE mg/dL
Urobilinogen, UA: 0.2 mg/dL (ref 0.0–1.0)

## 2017-08-10 NOTE — Progress Notes (Signed)
Subjective:     Kelsey Wade is a 56 y.o. female presents for routine gynecological exam.  Patient states that pap smear was greater than 5 years agol . She denies history of abnormal pap smear. Patient has had 2 vaginal births. Her most recent mammgram was normal and patient was told to follow up in 1 year. Patient is perimenopausa. Menstrual cycles are described as sporadic. She currently denies vaginal discharge, urinary frequency, urgency, or dysuria.    Past Medical History:  Diagnosis Date  . Chronic back pain   . ETOH abuse   . Hyperlipidemia   . Stroke ([place])   . Tobacco abuse    Social History   Socioeconomic History  . Marital status: Divorced    Spouse name: Not on file  . Number of children: Not on file  . Years of education: Not on file  . Highest education level: Not on file  Social Needs  . Financial resource strain: Not on file  . Food insecurity - worry: Not on file  . Food insecurity - inability: Not on file  . Transportation needs - medical: Not on file  . Transportation needs - non-medical: Not on file  Occupational History  . Not on file  Tobacco Use  . Smoking status: Current Every Day Smoker    Packs/day: 0.50    Types: Cigarettes  . Smokeless tobacco: Current User  . Tobacco comment: less  Substance and Sexual Activity  . Alcohol use: Yes    Comment: Occasionally   . Drug use: Yes    Types: Marijuana    Comment: daily   . Sexual activity: Not on file  Other Topics Concern  . Not on file  Social History Narrative  . Not on file   Immunization History  Administered Date(s) Administered  . Influenza,inj,Quad PF,6+ Mos 06/10/2017  . Pneumococcal Polysaccharide-23 11/12/2016  . Tdap 11/12/2016   Gynecologic History No LMP recorded. Patient is not currently having periods (Reason: Perimenopausal). Contraception: none Last GNF:[id number] than 5 years ago Last mammogram: 1 month ago; results negative  Obstetric History OB History  No data  available   Review of Systems  Constitutional: Negative.   HENT: Negative.   Eyes: Negative.   Respiratory: Negative.   Cardiovascular: Negative.  Negative for chest pain and palpitations.  Gastrointestinal: Negative.   Genitourinary: Negative.   Musculoskeletal: Negative.   Skin: Negative.   Neurological: Negative.   Endo/Heme/Allergies: Negative.   Psychiatric/Behavioral: Negative.     Objective:   Physical Exam  Constitutional: She is oriented to person, place, and time.  HENT:  Head: Normocephalic.  Right Ear: External ear normal.  Mouth/Throat: Oropharynx is clear and moist.  Eyes: Conjunctivae are normal. Pupils are equal, round, and reactive to light.  Neck: Normal range of motion. Neck supple.  Cardiovascular: Normal rate, regular rhythm, normal heart sounds and intact distal pulses.  Pulmonary/Chest: Effort normal and breath sounds normal.  Abdominal: Soft. Bowel sounds are normal.  Genitourinary: Rectum normal, uterus normal, right adnexa normal, left adnexa normal and vulva normal. Vulva exhibits no lesion and no rash. Vagina exhibits normal mucosa and no exudate. Watery and vaginal discharge found.  Neurological: She is alert and oriented to person, place, and time. Gait normal.  Skin: Skin is warm and dry.  Psychiatric: Mood, memory, affect and judgment normal.   Assessment:    Healthy female exam.    Plan:  1. Pap smear, as part of routine gynecological examination  - POCT urinalysis dip (device) -  Cytology - PAP Branch  2. Colon cancer screening - Ambulatory referral to Gastroenterology  3. Tobacco dependence Smoking cessation instruction/counseling given:  counseled patient on the dangers of tobacco use, advised patient to stop smoking, and reviewed strategies to maximize success    RTC: 3 months for chronic conditions   Donia Pounds  MSN, FNP-C [facility] [address] [place], [place]  [address] [phone]

## 2017-08-15 LAB — CYTOLOGY - PAP
Bacterial vaginitis: NEGATIVE
Candida vaginitis: NEGATIVE
Chlamydia: NEGATIVE
Diagnosis: NEGATIVE
Neisseria Gonorrhea: NEGATIVE
Trichomonas: NEGATIVE

## 2017-08-16 ENCOUNTER — Ambulatory Visit: Payer: Medicaid Other | Admitting: Occupational Therapy

## 2017-08-16 DIAGNOSIS — M25511 Pain in right shoulder: Secondary | ICD-10-CM

## 2017-08-16 DIAGNOSIS — G8929 Other chronic pain: Secondary | ICD-10-CM

## 2017-08-16 DIAGNOSIS — I69351 Hemiplegia and hemiparesis following cerebral infarction affecting right dominant side: Secondary | ICD-10-CM | POA: Diagnosis not present

## 2017-08-16 NOTE — Patient Instructions (Signed)
1. Flexion (Assistive)    Hold right wrist with Lt hand, thumb/palm up and raise arms above head, keeping elbow as straight as possible. Do lying down. Repeat __10__ times. Do _2-3___ sessions per day.    2. Sitting up - bring arms down to the floor and relax into gravity, then hold Rt wrist with Lt hand so that Rt palm is up and raise up to eye level while beginning to sit up. Keep relaxed and keep elbow straight. Do NOT tense. Do 10 times, 2-3 times per day.   3. Sitting up - gently stretch Rt palm up with Lt hand. Do NOT tense up or hike shoulder. Hold 10 sec. Repeat 5 times, 2-3 times per day  4. Table slides: sit up tall in chair close to dining room table - Gently stretch Rt arm forwards and back along table (with folded towel under hand) using Lt hand to guide Rt arm. Do 10 times, then do BIG circles clockwise along table 10 times. Do each 2-3 times per day.    RECOMMEND DISCUSSING BOTOX WITH NEUROLOGIST. Recommend botox to finger flexors and wrist flexors

## 2017-08-16 NOTE — Therapy (Signed)
[facility] [address] [place], [place], [address] Phone: [phone]   Fax:  [phone]  Occupational Therapy Treatment  Patient Details  Name: Kelsey Wade MRN: [id number] Date of Birth: 07/27/1961 Referring Provider: Cammie Sickle, FNP   Encounter Date: 08/16/2017  OT End of Session - 08/16/17 1416    Visit Number  2    Number of Visits  9    Date for OT Re-Evaluation  09/03/17    Authorization Type  MCD     Authorization Time Period  approved 8 visitis from 08/04/17 - 08/31/17 (will need to request date extension through end of year at beginning of Dec)    Authorization - Visit Number  1    Authorization - Number of Visits  8    OT Start Time  1315    OT Stop Time  1400    OT Time Calculation (min)  45 min    Activity Tolerance  Patient tolerated treatment well       Past Medical History:  Diagnosis Date  . Chronic back pain   . ETOH abuse   . Hyperlipidemia   . Stroke ([place])   . Tobacco abuse     Past Surgical History:  Procedure Laterality Date  . APPENDECTOMY      There were no vitals filed for this visit.                OT Treatments/Exercises (OP) - 08/16/17 0001      Neurological Re-education Exercises   Other Exercises 1  Pt shown self stretches in proper positioning to avoid compensations and dominating synergy patterns. See pt instructions for details. Pt required cueing and education re:how to stretch properly and what to avoid             OT Education - 08/16/17 1414    Education provided  Yes    Education Details  Initial HEP for self ROM    Person(s) Educated  Patient    Methods  Explanation;Demonstration;Handout;Verbal cues    Comprehension  Verbalized understanding;Returned demonstration;Verbal cues required       OT Short Term Goals - 07/21/17 1620      OT SHORT TERM GOAL #1   Title  Independent with HEP for RUE    Baseline  Dependent    Time   4    Period  Weeks    Status  New    Target Date  09/03/17      OT SHORT TERM GOAL #2   Title  Independent with splint wear and care RT hand    Baseline  dependent     Time  4    Period  Weeks    Status  New      OT SHORT TERM GOAL #3   Title  Pt to be mod I dressing UE and bathing UB with hemi-techniques and A/E PRN    Baseline  MIN ASSIST    Time  4    Period  Weeks    Status  New      OT SHORT TERM GOAL #4   Title  Pt to verbalized understanding with A/E to increase independence with BADLS (including: LH sponge, rocker knife, shoe buttons)    Baseline  Dependent     Time  4    Period  Weeks    Status  New        OT Long Term Goals - 07/21/17 1623  OT LONG TERM GOAL #1   Title  Independent with updated HEP (All LTG's to begin in Jan 2019)    Time  8    Period  Weeks    Status  New    Target Date  11/04/17      OT LONG TERM GOAL #2   Title  Pt to demo functional low to mid range reaching to grasp/release 1" objects consistently    Baseline  inconsistent with grasping, unable to release     Time  8    Period  Weeks    Status  New      OT LONG TERM GOAL #3   Title  Pt to verbalize understanding with pain reduction strategies RT shoulder    Baseline  dependent    Time  8    Period  Weeks    Status  New      OT LONG TERM GOAL #4   Title  Pt to be mod I with all BADLS    Baseline  Min assist    Time  8    Period  Weeks    Status  New      OT LONG TERM GOAL #5   Title  Pt to demo 75% finger extension with wrist neutral for functional releasing    Baseline  75% present only with extreme wrist flex and ulnar deviation    Time  8    Period  Weeks    Status  New            Plan - 08/16/17 1418    Clinical Impression Statement  Pt with greater understanding on proper positioning of RUE with stretches and compensations/synergy patterns to avoid    Current Impairments/barriers affecting progress:  severity of deficits, time since onset, MCD  limitations    OT Frequency  2x / week    OT Duration  4 weeks through 2018. Then recommend 2x/wk for 6 weeks beginning 2019    OT Treatment/Interventions  Self-care/ADL training;DME and/or AE instruction;Fluidtherapy;Moist Heat;Splinting;Patient/family education;Therapeutic exercises;Therapeutic activities;Passive range of motion;Neuromuscular education    Plan  begin splinting for wrist/fingers as tolerated    Consulted and Agree with Plan of Care  Patient       Patient will benefit from skilled therapeutic intervention in order to improve the following deficits and impairments:  Decreased coordination, Decreased range of motion, Impaired flexibility, Improper body mechanics, Impaired sensation, Decreased safety awareness, Impaired tone, Impaired UE functional use, Pain, Decreased strength, Decreased mobility, Decreased cognition, Impaired perceived functional ability  Visit Diagnosis: 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 Left carotid artery occlusion   . CVA (cerebrovascular accident) ([place]) 03/02/2017  . Chronic pain 03/02/2017  . ETOH abuse 03/02/2017  . Ischemic stroke ([place])   . Hyperlipidemia LDL goal <100 11/15/2016  . Essential hypertension 11/12/2016  . Neuropathy 11/12/2016  . Tobacco dependence 11/12/2016    Carey Bullocks, OTR/L 08/16/2017, 2:20 PM  [facility] [address] [place], [place], [address] Phone: [phone]   Fax:  [phone]  Name: Kelsey Wade MRN: [id number] Date of Birth: 1960/09/24

## 2017-08-18 ENCOUNTER — Ambulatory Visit: Payer: Medicaid Other | Admitting: Occupational Therapy

## 2017-08-18 DIAGNOSIS — I69351 Hemiplegia and hemiparesis following cerebral infarction affecting right dominant side: Secondary | ICD-10-CM

## 2017-08-18 DIAGNOSIS — M25511 Pain in right shoulder: Secondary | ICD-10-CM

## 2017-08-18 DIAGNOSIS — G8929 Other chronic pain: Secondary | ICD-10-CM

## 2017-08-18 NOTE — Therapy (Signed)
[facility] [address] [place], [place], [address] Phone: [phone]   Fax:  [phone]  Occupational Therapy Treatment  Patient Details  Name: Kelsey Wade MRN: [id number] Date of Birth: [birth year] Referring Provider: Cammie Sickle, FNP   Encounter Date: 08/18/2017  OT End of Session - 08/18/17 1548    Visit Number  3    Number of Visits  9    Date for OT Re-Evaluation  09/03/17    Authorization Type  MCD     Authorization Time Period  approved 8 visitis from 08/04/17 - 08/31/17 (will need to request date extension through end of year at beginning of Dec)    Authorization - Visit Number  2    Authorization - Number of Visits  8    OT Start Time  1315    OT Stop Time  1405    OT Time Calculation (min)  50 min    Activity Tolerance  Patient tolerated treatment well       Past Medical History:  Diagnosis Date  . Chronic back pain   . ETOH abuse   . Hyperlipidemia   . Stroke ([place])   . Tobacco abuse     Past Surgical History:  Procedure Laterality Date  . APPENDECTOMY      There were no vitals filed for this visit.  Subjective Assessment - 08/18/17 1543    Patient is accompained by:  Family member husband    Pertinent History  CVA 03/02/17. PMH: HTN, ETOH abuse, depression, anxiety    Patient Stated Goals  To get my Rt arm better b/c I'm right handed    Pain Onset  More than a month ago                   OT Treatments/Exercises (OP) - 08/18/17 0001      ADLs   ADL Comments  Pt's husband shown proper stretching of Rt wrist and hand to prevent ulnar deviation and to manage spasticity. Pt/husband also had questions re: botox recommendations and encouraged them to first set up neurology consult with MD.       Splinting   Splinting  Began fabrication of resting hand style splint but modified d/t spasticity and to help prevent ulnar deviation. However, did not finish splint d/t time  constraints and will need to modify further at next session.                OT Short Term Goals - 08/18/17 1549      OT SHORT TERM GOAL #1   Title  Independent with HEP for RUE    Baseline  Dependent    Time  4    Period  Weeks    Status  On-going      OT SHORT TERM GOAL #2   Title  Independent with splint wear and care RT hand    Baseline  dependent     Time  4    Period  Weeks    Status  On-going      OT SHORT TERM GOAL #3   Title  Pt to be mod I dressing UE and bathing UB with hemi-techniques and A/E PRN    Baseline  MIN ASSIST    Time  4    Period  Weeks    Status  New      OT SHORT TERM GOAL #4   Title  Pt to verbalized understanding with A/E to increase independence with  BADLS (including: LH sponge, rocker knife, shoe buttons)    Baseline  Dependent     Time  4    Period  Weeks    Status  New        OT Long Term Goals - 07/21/17 1623      OT LONG TERM GOAL #1   Title  Independent with updated HEP (All LTG's to begin in Jan 2019)    Time  8    Period  Weeks    Status  New    Target Date  11/04/17      OT LONG TERM GOAL #2   Title  Pt to demo functional low to mid range reaching to grasp/release 1" objects consistently    Baseline  inconsistent with grasping, unable to release     Time  8    Period  Weeks    Status  New      OT LONG TERM GOAL #3   Title  Pt to verbalize understanding with pain reduction strategies RT shoulder    Baseline  dependent    Time  8    Period  Weeks    Status  New      OT LONG TERM GOAL #4   Title  Pt to be mod I with all BADLS    Baseline  Min assist    Time  8    Period  Weeks    Status  New      OT LONG TERM GOAL #5   Title  Pt to demo 75% finger extension with wrist neutral for functional releasing    Baseline  75% present only with extreme wrist flex and ulnar deviation    Time  8    Period  Weeks    Status  New            Plan - 08/18/17 1549    Clinical Impression Statement  Pt progressing  towards STG's #1 and #2.     Rehab Potential  Fair    Current Impairments/barriers affecting progress:  severity of deficits, time since onset, MCD limitations    OT Frequency  2x / week    OT Duration  4 weeks then 2x/wk for 6 weeks beginning 2019    OT Treatment/Interventions  Self-care/ADL training;DME and/or AE instruction;Fluidtherapy;Moist Heat;Splinting;Patient/family education;Therapeutic exercises;Therapeutic activities;Passive range of motion;Neuromuscular education    Plan  finish splint and issue, begin hemi techniques for dressing and bathing. 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 Left carotid artery occlusion   . CVA (cerebrovascular accident) ([place]) 03/02/2017  . Chronic pain 03/02/2017  . ETOH abuse 03/02/2017  . Ischemic stroke ([place])   . Hyperlipidemia LDL goal <100 11/15/2016  . Essential hypertension 11/12/2016  . Neuropathy 11/12/2016  . Tobacco dependence 11/12/2016    Carey Bullocks, OTR/L 08/18/2017, 3:53 PM  [facility]  [facility] [address] [place], [place], [address] Phone: [phone]   Fax:  [phone]  Name: Mekiah Wahler MRN: [id number] Date of Birth: 1961-04-29

## 2017-08-23 ENCOUNTER — Other Ambulatory Visit: Payer: Medicaid Other

## 2017-08-23 ENCOUNTER — Ambulatory Visit: Payer: Medicaid Other | Attending: Family Medicine | Admitting: Occupational Therapy

## 2017-08-23 ENCOUNTER — Ambulatory Visit (INDEPENDENT_AMBULATORY_CARE_PROVIDER_SITE_OTHER): Payer: Medicaid Other | Admitting: Physician Assistant

## 2017-08-23 ENCOUNTER — Encounter: Payer: Self-pay | Admitting: Physician Assistant

## 2017-08-23 VITALS — BP 110/72 | HR 76 | Ht 64.75 in | Wt 133.0 lb

## 2017-08-23 DIAGNOSIS — M25511 Pain in right shoulder: Secondary | ICD-10-CM | POA: Diagnosis present

## 2017-08-23 DIAGNOSIS — Z1211 Encounter for screening for malignant neoplasm of colon: Secondary | ICD-10-CM | POA: Diagnosis not present

## 2017-08-23 DIAGNOSIS — G8929 Other chronic pain: Secondary | ICD-10-CM | POA: Diagnosis present

## 2017-08-23 DIAGNOSIS — Z8673 Personal history of transient ischemic attack (TIA), and cerebral infarction without residual deficits: Secondary | ICD-10-CM

## 2017-08-23 DIAGNOSIS — I69351 Hemiplegia and hemiparesis following cerebral infarction affecting right dominant side: Secondary | ICD-10-CM | POA: Diagnosis present

## 2017-08-23 DIAGNOSIS — R278 Other lack of coordination: Secondary | ICD-10-CM

## 2017-08-23 NOTE — Therapy (Addendum)
[facility] [address] [place], [place], [address] Phone: [phone]   Fax:  [phone]  Occupational Therapy Treatment  Patient Details  Name: Kelsey Wade MRN: [id number] Date of Birth: 04-27-1961 Referring Provider: Cammie Sickle, FNP   Encounter Date: 08/23/2017  OT End of Session - 08/23/17 1420    Visit Number  4    Number of Visits  9    Date for OT Re-Evaluation  09/03/17    Authorization Type  MCD     Authorization Time Period  approved 8 visitis from 08/04/17 - 08/31/17 (will need to request date extension through end of year at beginning of Dec) - put in request for date extension 08/23/17   Authorization - Visit Number  3    Authorization - Number of Visits  8    OT Start Time  1315    OT Stop Time  1400    OT Time Calculation (min)  45 min    Activity Tolerance  Patient tolerated treatment well       Past Medical History:  Diagnosis Date  . Arthritis   . Chronic back pain   . Depression   . ETOH abuse   . GERD (gastroesophageal reflux disease)   . HTN (hypertension)   . Hyperlipidemia   . Stroke ([facility])   . Tobacco abuse     Past Surgical History:  Procedure Laterality Date  . APPENDECTOMY      There were no vitals filed for this visit.  Subjective Assessment - 08/23/17 1335    Subjective   It feels fine, just different    Pertinent History  CVA 03/02/17. PMH: HTN, ETOH abuse, depression, anxiety    Patient Stated Goals  To get my Rt arm better b/c I'm right handed    Currently in Pain?  Yes    Pain Score  9     Pain Location  Arm    Pain Orientation  Right    Pain Descriptors / Indicators  Aching;Sore    Pain Onset  More than a month ago    Pain Frequency  Intermittent    Aggravating Factors   movement    Pain Relieving Factors  rest                   OT Treatments/Exercises (OP) - 08/23/17 0001      ADLs   ADL Comments  Discussed potential A/E needs to make  ADLS easier/more independent including: rocker knife, options for tying shoes, and one handed cutting board and can opener. Pt practiced use of rocker knife. Pt provided handouts and educated in how/where to purchase if interested.       Splinting   Splinting  Finished splint, reviewed wear and care, proper donning, and issued splint.              OT Education - 08/23/17 1341    Education provided  Yes    Education Details  splint wear and care    Person(s) Educated  Patient    Methods  Explanation;Demonstration;Handout    Comprehension  Verbalized understanding       OT Short Term Goals - 08/23/17 1421      OT SHORT TERM GOAL #1   Title  Independent with HEP for RUE - ALL LTG's to be due by end of January 2019    Baseline  Dependent    Time  4    Period  Weeks  Status  Achieved      OT SHORT TERM GOAL #2   Title  Independent with splint wear and care RT hand    Baseline  dependent     Time  4    Period  Weeks    Status  On-going issued, but may need adjustments      OT SHORT TERM GOAL #3   Title  Pt to be mod I dressing UE and bathing UB with hemi-techniques and A/E PRN    Baseline  MIN ASSIST    Time  4    Period  Weeks    Status  New      OT SHORT TERM GOAL #4   Title  Pt to verbalized understanding with A/E to increase independence with BADLS (including: LH sponge, rocker knife, shoe buttons)    Baseline  Dependent     Time  4    Period  Weeks    Status  On-going Pt shown A/E and issued handouts, however may need review and more practice with A/E         OT Long Term Goals - 07/21/17 1623      OT LONG TERM GOAL #1   Title  Independent with updated HEP (All LTG's to begin in Jan 2019)    Time  8    Period  Weeks    Status  New    Target Date  11/04/17      OT LONG TERM GOAL #2   Title  Pt to demo functional low to mid range reaching to grasp/release 1" objects consistently    Baseline  inconsistent with grasping, unable to release     Time  8     Period  Weeks    Status  New      OT LONG TERM GOAL #3   Title  Pt to verbalize understanding with pain reduction strategies RT shoulder    Baseline  dependent    Time  8    Period  Weeks    Status  New      OT LONG TERM GOAL #4   Title  Pt to be mod I with all BADLS    Baseline  Min assist    Time  8    Period  Weeks    Status  New      OT LONG TERM GOAL #5   Title  Pt to demo 75% finger extension with wrist neutral for functional releasing    Baseline  75% present only with extreme wrist flex and ulnar deviation    Time  8    Period  Weeks    Status  New          Plan - 09/06/17 3419    Clinical Impression Statement  Pt progressing towards STG's. Pt has met STG #1. Other goals are being addressed but not fully met - see goal section. Have not yet addressed LTG's due to only being seen eval and 3 treatment visits. Medicaid did not give the date extension to end of December in time, therefore will request 3 visits at 1x/wk for 3 weeks in January, then 2x/wk for 6 weeks in February 2019.     Rehab Potential  Fair    Current Impairments/barriers affecting progress:  severity of deficits, time since onset, MCD limitations    OT Frequency  1x / week    OT Duration  -- for 3 weeks January 2019, then 2x/wk for 6 weeks beginning  February 2019    OT Treatment/Interventions  Self-care/ADL training;DME and/or AE instruction;Splinting;Therapeutic activities;Moist Heat;Ultrasound;Therapeutic exercise;Neuromuscular education;Passive range of motion;Manual Therapy;Electrical Stimulation    Plan  assess splint, begin hemi techniques for dressing/bathing, continue NMR for RUE    Consulted and Agree with Plan of Care  Patient                     Patient will benefit from skilled therapeutic intervention in order to improve the following deficits and impairments:  Decreased coordination, Decreased range of motion, Impaired flexibility, Improper body mechanics, Impaired sensation,  Decreased safety awareness, Impaired tone, Impaired UE functional use, Pain, Decreased strength, Decreased mobility, Decreased cognition, Impaired perceived functional ability  Visit Diagnosis: Spastic hemiplegia of right dominant 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 Left carotid artery occlusion   . CVA (cerebrovascular accident) ([place]) 03/02/2017  . Chronic pain 03/02/2017  . ETOH abuse 03/02/2017  . Ischemic stroke ([place])   . Hyperlipidemia LDL goal <100 11/15/2016  . Essential hypertension 11/12/2016  . Neuropathy 11/12/2016  . Tobacco dependence 11/12/2016    Carey Bullocks, OTR/L 08/23/2017, 2:25 PM  [facility] [address] [place], [place], [address] Phone: [phone]   Fax:  [phone]  Name: Kelsey Wade MRN: [id number] Date of Birth: 06/21/1961

## 2017-08-23 NOTE — Patient Instructions (Signed)
Please go to the basement level to have your labs drawn.  We will call you with the results. 

## 2017-08-23 NOTE — Progress Notes (Addendum)
Subjective:    Patient ID: Kelsey Wade, female    DOB: 1961-06-26, 56 y.o.   MRN: [id number]  HPI Kelsey Wade is a pleasant 56 year old white female, new to GI today referred by Cammie Sickle NP for colon cancer screening. Patient has not had any prior endoscopic evaluation.  Her family history is negative for colon cancer and polyps. She has no current GI complaints.  Specifically no complaints of abdominal pain, changes in bowels melena or hematochezia.  Her appetite has been fine and her weight has been stable.  She does have history of GERD and has been maintained on omeprazole 20 mg p.o. daily recently with good control of her symptoms. Patient has history of hypertension, she is status post CVA in June 2018 with right hemiparesis.  She is undergoing physical and occupational therapy.  She is now maintained on Plavix and aspirin.  Was found to have a total left carotid occlusion and bilateral MCA infarcts, old cerebellar infarcts severe stenosis of the right vertebral artery origin and moderate stenoses bilateral mid to distal posterior cerebral arteries compatible with atherosclerosis on CT angios done in June 2018.  Review of Systems; Pertinent positive and negative review of systems were noted in the above HPI section.  All other review of systems was otherwise negative.  Outpatient Encounter Medications as of 08/23/2017  Medication Sig  . amLODipine (NORVASC) 10 MG tablet Take 1 tablet (10 mg total) by mouth daily.  Marland Kitchen aspirin EC 81 MG tablet Take 1 tablet daily along with plavix 75mg  daily  . atorvastatin (LIPITOR) 40 MG tablet Take 1 tablet (40 mg total) by mouth daily.  . busPIRone (BUSPAR) 10 MG tablet Take 1 tablet (10 mg total) by mouth 2 (two) times daily.  . clopidogrel (PLAVIX) 75 MG tablet Take 1 tablet daily along with baby aspirin daily for 3 months.  . cyclobenzaprine (FLEXERIL) 10 MG tablet Take 1 tablet by mouth at bedtime as needed.  . DULoxetine (CYMBALTA) 20 MG  capsule Take 1 capsule (20 mg total) by mouth 2 (two) times daily.  Marland Kitchen gabapentin (NEURONTIN) 400 MG capsule TAKE 1 CAPSULE BY MOUTH 3 TIMES DAILY  . meloxicam (MOBIC) 15 MG tablet Take 1 tablet by mouth daily as needed.  . nicotine (NICODERM CQ - DOSED IN MG/24 HOURS) 21 mg/24hr patch Place 1 patch (21 mg total) onto the skin daily.  Marland Kitchen omeprazole (PRILOSEC) 20 MG capsule TAKE 1 CAPSULE (20 MG TOTAL) BY MOUTH DAILY.  Marland Kitchen oxyCODONE (OXY IR/ROXICODONE) 5 MG immediate release tablet Take 5 mg by mouth every 6 (six) hours as needed.  . sertraline (ZOLOFT) 25 MG tablet Take 1 tablet by mouth daily.  . [DISCONTINUED] acetaminophen-codeine (TYLENOL #3) 300-30 MG tablet Take 1 tablet by mouth every 6 (six) hours as needed for moderate pain.   No facility-administered encounter medications on file as of 08/23/2017.    No Known Allergies Patient Active Problem List   Diagnosis Date Noted  . Left carotid artery occlusion   . CVA (cerebrovascular accident) ([place]) 03/02/2017  . Chronic pain 03/02/2017  . ETOH abuse 03/02/2017  . Ischemic stroke ([place])   . Hyperlipidemia LDL goal <100 11/15/2016  . Essential hypertension 11/12/2016  . Neuropathy 11/12/2016  . Tobacco dependence 11/12/2016   Social History   Socioeconomic History  . Marital status: Divorced    Spouse name: Not on file  . Number of children: 2  . Years of education: Not on file  . Highest education level: Not on file  Social Needs  . Financial resource strain: Not on file  . Food insecurity - worry: Not on file  . Food insecurity - inability: Not on file  . Transportation needs - medical: Not on file  . Transportation needs - non-medical: Not on file  Occupational History  . Not on file  Tobacco Use  . Smoking status: Current Every Day Smoker    Packs/day: 0.50    Types: Cigarettes  . Smokeless tobacco: Never Used  . Tobacco comment: less  Substance and Sexual Activity  . Alcohol use: Yes    Comment: Occasionally   . Drug  use: Yes    Types: Marijuana    Comment: daily   . Sexual activity: Not on file  Other Topics Concern  . Not on file  Social History Narrative  . Not on file    Ms. Gear family history includes Alcohol abuse in her brother and father; Breast cancer in her maternal aunt.      Objective:    Vitals:   08/23/17 1053  BP: 110/72  Pulse: 76    Physical Exam white female in no acute distress, blood pressure 110/70 pulse 76, height 5 foot 4, weight 133, BMI 22.3.  HEENT; nontraumatic normocephalic EOMI PERRLA sclera anicteric, Cardiovascular ;regular rate and rhythm with S1-S2 no murmur rub or gallop, Pulmonary; clear bilaterally, Abdomen ;soft, nontender nondistended there is no palpable mass or hepatosplenomegaly bowel sounds are present, Rectal ;exam not done, Extremities ;no clubbing cyanosis or edema skin warm dry, Neuro psych ;mood and affect appropriate he has a right hemiparesis with fairly good use of her right lower extremity.       Assessment & Plan:   #26 56 year old white female referred for colon cancer surveillance, patient asymptomatic and average risk for colon cancer. #2 status post CVA June 2018 with right hemiparesis.  She has a complete left carotid occlusion and significant cerebrovascular disease. 3.  Chronic antiplatelet therapy-on Plavix and aspirin 4.  Hyperlipidemia 5.  Smoker 6.  Hypertension  Plan; Options were discussed with the patient.  I do not think that she should be taken off of Plavix at this time for screening colonoscopy given fairly recent history of large CVA and significant cerebrovascular disease on CTA. She would be a good candidate for Cologuard stool DNA testing however Medicaid will not cover. Will start with FIT testing, if negative would plan to have follow-up in 1 year at which point we can discuss other modalities of screening, and possible colonoscopy. If it test is positive she would require colonoscopy and will need to further  discuss risk-benefit of holding Plavix. Patient will be established with Dr. Hilarie Fredrickson  Greater than 50% of the visit was spent in counseling and education .  Amy S Esterwood PA-C 08/23/2017   Cc: Dorena Dew, FNP  Addendum: Reviewed and agree with initial management. Pyrtle, Lajuan Lines, MD

## 2017-08-23 NOTE — Patient Instructions (Signed)
Your Splint This splint should initially be fitted by a healthcare practitioner.  The healthcare practitioner is responsible for providing wearing instructions and precautions to the patient, other healthcare practitioners and care provider involved in the patient's care.  This splint was custom made for you. Please read the following instructions to learn about wearing and caring for your splint.  Precautions Should your splint cause any of the following problems, remove the splint immediately and contact your therapist/physician.  Swelling  Severe Pain  Pressure Areas  Stiffness  Numbness  Do not wear your splint while operating machinery unless it has been fabricated for that purpose.  When To Wear Your Splint Where your splint according to your therapist/physician instructions. Daytime for 2 to 3 hours, then if no problems, gradually increase tolerance during the day until you can tolerate 5 consecutive hours with no problems. Then, you can start wearing splint at night.   Care and Cleaning of Your Splint 1. Keep your splint away from open flames. 2. Your splint will lose its shape in temperatures over 135 degrees Farenheit, ( in car windows, near radiators, ovens or in hot water).  Never make any adjustments to your splint, if the splint needs adjusting remove it and make an appointment to see your therapist. 3. Your splint may be cleaned with rubbing alcohol.  Do not immerse in hot water over 135 degrees Farenheit. 4. Straps may be washed with soap and water, but do not moisten the self-adhesive portion. 5. For ink or hard to remove spots use a scouring cleanser which contains chlorine.  Rinse the splint thoroughly after using chlorine cleanser.   Recommend speaking with neurologist about getting botox injections to below muscles:   Finger flexors (FDS and FDP) Wrist flexors (especially FCU) Pronator teres Biceps/elbow flexors ? Pectoralis major and minor

## 2017-08-25 ENCOUNTER — Ambulatory Visit: Payer: Medicaid Other | Admitting: Occupational Therapy

## 2017-08-30 ENCOUNTER — Ambulatory Visit: Payer: Medicaid Other | Admitting: Occupational Therapy

## 2017-09-01 ENCOUNTER — Ambulatory Visit: Payer: Medicaid Other | Admitting: Occupational Therapy

## 2017-09-06 NOTE — Addendum Note (Signed)
Addended by: Hans Eden on: 09/06/2017 09:29 AM   Modules accepted: Orders

## 2017-09-08 ENCOUNTER — Other Ambulatory Visit: Payer: Self-pay | Admitting: Family Medicine

## 2017-09-08 DIAGNOSIS — M5137 Other intervertebral disc degeneration, lumbosacral region: Secondary | ICD-10-CM

## 2017-09-16 ENCOUNTER — Ambulatory Visit
Admission: RE | Admit: 2017-09-16 | Discharge: 2017-09-16 | Disposition: A | Discharge status: Home / Self Care | Payer: Medicaid Other | Source: Ambulatory Visit | Attending: Physician Assistant | Admitting: Physician Assistant

## 2017-09-16 DIAGNOSIS — Z1211 Encounter for screening for malignant neoplasm of colon: Secondary | ICD-10-CM

## 2017-09-16 DIAGNOSIS — Z8673 Personal history of transient ischemic attack (TIA), and cerebral infarction without residual deficits: Secondary | ICD-10-CM

## 2017-09-21 ENCOUNTER — Ambulatory Visit: Payer: Medicaid Other | Admitting: Family Medicine

## 2017-09-23 ENCOUNTER — Other Ambulatory Visit: Payer: Self-pay

## 2017-09-23 DIAGNOSIS — K621 Rectal polyp: Secondary | ICD-10-CM

## 2017-09-26 ENCOUNTER — Encounter: Payer: Self-pay | Admitting: Neurology

## 2017-09-28 ENCOUNTER — Telehealth: Payer: Self-pay

## 2017-09-28 NOTE — Telephone Encounter (Signed)
Patient called wanting her buspar changed, however she has a new primary and I advised her she would have to be a patient here and have an appointment to discussing the medication. She wants to stay with her new primary doctor, she has an appointment scheduled for next week and will discuss this with them at that time. [facility] medical clinic)

## 2017-09-29 ENCOUNTER — Ambulatory Visit: Payer: Medicaid Other | Admitting: Occupational Therapy

## 2017-09-30 ENCOUNTER — Telehealth: Payer: Self-pay | Admitting: Family Medicine

## 2017-09-30 NOTE — Telephone Encounter (Signed)
Patient called to request for medication refill  omeprazole (PRILOSEC) 20 MG capsule [[id number]]  amLODipine (NORVASC) 10 MG tablet [[id number]]  Send to [facility] pharmacy

## 2017-10-01 ENCOUNTER — Encounter (HOSPITAL_COMMUNITY): Payer: Self-pay | Admitting: Emergency Medicine

## 2017-10-01 ENCOUNTER — Emergency Department (HOSPITAL_COMMUNITY): Payer: Medicaid Other

## 2017-10-01 ENCOUNTER — Emergency Department (HOSPITAL_COMMUNITY)
Admission: EM | Admit: 2017-10-01 | Discharge: 2017-10-02 | Disposition: A | Discharge status: Home / Self Care | Payer: Medicaid Other | Attending: Emergency Medicine | Admitting: Emergency Medicine

## 2017-10-01 ENCOUNTER — Other Ambulatory Visit: Payer: Self-pay

## 2017-10-01 DIAGNOSIS — Z79899 Other long term (current) drug therapy: Secondary | ICD-10-CM | POA: Diagnosis not present

## 2017-10-01 DIAGNOSIS — S39012A Strain of muscle, fascia and tendon of lower back, initial encounter: Secondary | ICD-10-CM | POA: Diagnosis not present

## 2017-10-01 DIAGNOSIS — W1830XA Fall on same level, unspecified, initial encounter: Secondary | ICD-10-CM | POA: Insufficient documentation

## 2017-10-01 DIAGNOSIS — S7002XA Contusion of left hip, initial encounter: Secondary | ICD-10-CM | POA: Diagnosis not present

## 2017-10-01 DIAGNOSIS — Z7982 Long term (current) use of aspirin: Secondary | ICD-10-CM | POA: Insufficient documentation

## 2017-10-01 DIAGNOSIS — F17210 Nicotine dependence, cigarettes, uncomplicated: Secondary | ICD-10-CM | POA: Insufficient documentation

## 2017-10-01 DIAGNOSIS — Y999 Unspecified external cause status: Secondary | ICD-10-CM | POA: Insufficient documentation

## 2017-10-01 DIAGNOSIS — I10 Essential (primary) hypertension: Secondary | ICD-10-CM | POA: Diagnosis not present

## 2017-10-01 DIAGNOSIS — Y9301 Activity, walking, marching and hiking: Secondary | ICD-10-CM | POA: Diagnosis not present

## 2017-10-01 DIAGNOSIS — S79912A Unspecified injury of left hip, initial encounter: Secondary | ICD-10-CM | POA: Diagnosis present

## 2017-10-01 DIAGNOSIS — Y929 Unspecified place or not applicable: Secondary | ICD-10-CM | POA: Diagnosis not present

## 2017-10-01 LAB — COMPREHENSIVE METABOLIC PANEL
ALT: 43 U/L (ref 14–54)
AST: 108 U/L — AB (ref 15–41)
Albumin: 4 g/dL (ref 3.5–5.0)
Alkaline Phosphatase: 66 U/L (ref 38–126)
Anion gap: 10 (ref 5–15)
BUN: 22 mg/dL — ABNORMAL HIGH (ref 6–20)
CHLORIDE: 106 mmol/L (ref 101–111)
CO2: 23 mmol/L (ref 22–32)
CREATININE: 0.71 mg/dL (ref 0.44–1.00)
Calcium: 9 mg/dL (ref 8.9–10.3)
GFR calc non Af Amer: >60 mL/min (ref >60)
Glucose, Bld: 134 mg/dL — ABNORMAL HIGH (ref 65–99)
Potassium: 3.4 mmol/L — ABNORMAL LOW (ref 3.5–5.1)
SODIUM: 139 mmol/L (ref 135–145)
Total Bilirubin: 0.8 mg/dL (ref 0.3–1.2)
Total Protein: 6.8 g/dL (ref 6.5–8.1)

## 2017-10-01 LAB — URINALYSIS, ROUTINE W REFLEX MICROSCOPIC
Bilirubin Urine: NEGATIVE
Glucose, UA: NEGATIVE mg/dL
Hgb urine dipstick: NEGATIVE
Ketones, ur: NEGATIVE mg/dL
LEUKOCYTES UA: NEGATIVE
Nitrite: NEGATIVE
PH: 5 (ref 5.0–8.0)
Protein, ur: NEGATIVE mg/dL
SPECIFIC GRAVITY, URINE: 1.025 (ref 1.005–1.030)

## 2017-10-01 LAB — CBC
HEMATOCRIT: 45.6 % (ref 36.0–46.0)
HEMOGLOBIN: 14.8 g/dL (ref 12.0–15.0)
MCH: 29.9 pg (ref 26.0–34.0)
MCHC: 32.5 g/dL (ref 30.0–36.0)
MCV: 92.1 fL (ref 78.0–100.0)
PLATELETS: 246 10*3/uL (ref 150–400)
RBC: 4.95 MIL/uL (ref 3.87–5.11)
RDW: 14.5 % (ref 11.5–15.5)
WBC: 8.5 10*3/uL (ref 4.0–10.5)

## 2017-10-01 LAB — I-STAT BETA HCG BLOOD, ED (MC, WL, AP ONLY): I-stat hCG, quantitative: <5 m[IU]/mL (ref <5)

## 2017-10-01 MED ORDER — LORAZEPAM 1 MG PO TABS: 1.0000 mg | ORAL_TABLET | Freq: Once | ORAL | Status: DC

## 2017-10-01 MED ORDER — DIAZEPAM 5 MG PO TABS
5.0000 mg | ORAL_TABLET | Freq: Once | ORAL | Status: AC
  Administered 2017-10-01: 5 mg via ORAL
  Filled 2017-10-01: qty 1

## 2017-10-01 MED ORDER — METHOCARBAMOL 500 MG PO TABS: 500.0000 mg | ORAL_TABLET | Freq: Two times a day (BID) | ORAL | 0 refills | Status: DC

## 2017-10-01 MED ORDER — KETOROLAC TROMETHAMINE 60 MG/2ML IM SOLN
30.0000 mg | Freq: Once | INTRAMUSCULAR | Status: AC
  Administered 2017-10-01: 30 mg via INTRAMUSCULAR
  Filled 2017-10-01: qty 2

## 2017-10-01 NOTE — ED Notes (Signed)
Patient transported to X-ray 

## 2017-10-01 NOTE — ED Provider Notes (Signed)
[name] EMERGENCY DEPARTMENT Provider Note   CSN: [id number] Arrival date & time: 10/01/17  1831     History   Chief Complaint Chief Complaint  Patient presents with  . Fall    HPI Kelsey Wade is a 57 y.o. female.  HPI Kelsey Wade is a 57 y.o. female with hx of chronic back pain, hypertension, hyperlipidemia, CVA, presents to emergency department complaining of lower back and hip pain after fall.  Patient states that she believes she fell in the middle of the night, because she was found this morning laying on the floor by her husband.  She states that she does have chronic pain and history of CVA with right-sided deficit, however when she went to bed she did not have any pain to the left hip or lower back that she is having now.  She states that she takes Percocet for her pain and took 4 tablets total today as well as Excedrin this morning, which did not help.  She states she is having trouble ambulating due to pain.  Denies any pain rating down her extremities.  No numbness or weakness to her extremities.  Denies any head injury.  No other complaints. Pt currently in pain management.   Past Medical History:  Diagnosis Date  . Arthritis   . Chronic back pain   . Depression   . ETOH abuse   . GERD (gastroesophageal reflux disease)   . HTN (hypertension)   . Hyperlipidemia   . Stroke ([place])   . Tobacco abuse     Patient Active Problem List   Diagnosis Date Noted  . Left carotid artery occlusion   . CVA (cerebrovascular accident) ([place]) 03/02/2017  . Chronic pain 03/02/2017  . ETOH abuse 03/02/2017  . Ischemic stroke ([place])   . Hyperlipidemia LDL goal <100 11/15/2016  . Essential hypertension 11/12/2016  . Neuropathy 11/12/2016  . Tobacco dependence 11/12/2016    Past Surgical History:  Procedure Laterality Date  . APPENDECTOMY      OB History    No data available       Home Medications    Prior to Admission medications     Medication Sig Start Date End Date Taking? Authorizing Provider  amLODipine (NORVASC) 10 MG tablet Take 1 tablet (10 mg total) by mouth daily. 03/04/17  Yes Barton Dubois, MD  aspirin EC 81 MG tablet Take 1 tablet daily along with plavix 75mg  daily Patient taking differently: Take 81 mg by mouth daily. Take 1 tablet daily along with plavix 75mg  daily 03/04/17  Yes Barton Dubois, MD  aspirin-acetaminophen-caffeine [facility] MIGRAINE) [phone] MG tablet Take 2 tablets by mouth every 6 (six) hours as needed for headache.   Yes [provider]  atorvastatin (LIPITOR) 40 MG tablet Take 1 tablet (40 mg total) by mouth daily. 03/04/17  Yes Barton Dubois, MD  busPIRone (BUSPAR) 10 MG tablet Take 1 tablet (10 mg total) by mouth 2 (two) times daily. Patient taking differently: Take 10 mg by mouth See admin instructions. Take one tablet (10 mg) by mouth every morning, may also take one tablet (10 mg) in the evening as needed for depression 03/15/17  Yes Dorena Dew, FNP  clopidogrel (PLAVIX) 75 MG tablet Take 1 tablet daily along with baby aspirin daily for 3 months. Patient taking differently: Take 75 mg by mouth daily. Take 1 tablet daily along with baby aspirin daily for 3 months. 03/04/17  Yes Barton Dubois, MD  Cyanocobalamin (VITAMIN B12 PO) Take  1 tablet by mouth daily.   Yes [provider]  gabapentin (NEURONTIN) 400 MG capsule TAKE 1 CAPSULE BY MOUTH 3 TIMES DAILY Patient taking differently: TAKE 1 CAPSULE (400 MG)  BY MOUTH EVERY MORNING, MAY ALSO TAKE 1 CAPSULE (400 MG) IN THE EVENING AS NEEDED FOR PAIN 07/14/17  Yes Dorena Dew, FNP  nicotine (NICODERM CQ - DOSED IN MG/24 HOURS) 21 mg/24hr patch Place 1 patch (21 mg total) onto the skin daily. Patient taking differently: Place 21 mg onto the skin daily as needed (smoking cessation).  03/05/17  Yes Barton Dubois, MD  omeprazole (PRILOSEC) 20 MG capsule TAKE 1 CAPSULE (20 MG TOTAL) BY MOUTH DAILY. 07/29/17  Yes Dorena Dew, FNP  oxyCODONE (OXY IR/ROXICODONE) 5 MG immediate release tablet Take 5 mg by mouth every 6 (six) hours as needed (pain).  08/16/17  Yes [provider]  sertraline (ZOLOFT) 50 MG tablet Take 50 mg by mouth at bedtime as needed (sleep).  09/06/17  Yes [provider]  DULoxetine (CYMBALTA) 20 MG capsule Take 1 capsule (20 mg total) by mouth 2 (two) times daily. Patient not taking: Reported on 10/01/2017 06/10/17   Dorena Dew, FNP    Family History Family History  Problem Relation Age of Onset  . Alcohol abuse Father   . Alcohol abuse Brother   . Breast cancer Maternal Aunt     Social History Social History   Tobacco Use  . Smoking status: Current Every Day Smoker    Packs/day: 0.50    Types: Cigarettes  . Smokeless tobacco: Never Used  . Tobacco comment: less  Substance Use Topics  . Alcohol use: Yes    Comment: Occasionally   . Drug use: Yes    Types: Marijuana    Comment: daily      Allergies   Patient has no known allergies.   Review of Systems Review of Systems  Constitutional: Negative for chills and fever.  Respiratory: Negative for cough, chest tightness and shortness of breath.   Cardiovascular: Negative for chest pain, palpitations and leg swelling.  Gastrointestinal: Negative for abdominal pain, diarrhea, nausea and vomiting.  Genitourinary: Negative for dysuria, flank pain and pelvic pain.  Musculoskeletal: Positive for arthralgias, back pain and myalgias. Negative for neck pain and neck stiffness.  Skin: Negative for rash.  Neurological: Negative for dizziness, weakness, numbness and headaches.  All other systems reviewed and are negative.    Physical Exam Updated Vital Signs BP (!) 169/146 (BP Location: Left Arm)   Pulse (!) 112   Temp 100 F (37.8 C) (Oral)   Resp 14   SpO2 98%   Physical Exam  Constitutional: She appears well-developed and well-nourished. No distress.  HENT:  Head: Normocephalic.  Eyes:  Conjunctivae are normal.  Neck: Neck supple.  Cardiovascular: Normal rate, regular rhythm and normal heart sounds.  Pulmonary/Chest: Effort normal and breath sounds normal. No respiratory distress. She has no wheezes. She has no rales.  Abdominal: Soft. Bowel sounds are normal. She exhibits no distension. There is no tenderness. There is no rebound.  Musculoskeletal: She exhibits no edema.  Thoracic and lumbar midline  spinal tenderness.  Bilateral paraspinal tenderness.  Tenderness to palpation left SI joint and left lateral hip.  Pain with range of motion of the hip.  Normal knee and ankle.  Dorsal pedal pulses intact and equal bilaterally.  Neurological: She is alert.  Skin: Skin is warm and dry.  Psychiatric: She has a normal mood and  affect. Her behavior is normal.  Nursing note and vitals reviewed.    ED Treatments / Results  Labs (all labs ordered are listed, but only abnormal results are displayed) Labs Reviewed  COMPREHENSIVE METABOLIC PANEL - Abnormal; Notable for the following components:      Result Value   Potassium 3.4 (*)    Glucose, Bld 134 (*)    BUN 22 (*)    AST 108 (*)    All other components within normal limits  CBC  URINALYSIS, ROUTINE W REFLEX MICROSCOPIC  I-STAT BETA HCG BLOOD, ED ([facility], [facility], [facility] ONLY)    EKG  EKG Interpretation None       Radiology Dg Thoracic Spine 2 View  Result Date: 10/01/2017 CLINICAL DATA:  Fall from bed. Back pain. History of stroke and LEFT-sided weakness. EXAM: LUMBAR SPINE - COMPLETE 4+ VIEW; THORACIC SPINE 3 VIEWS COMPARISON:  Chest radiograph March 02, 2017 and MRI of the thoracic and lumbar spine Feb 10, 2017 FINDINGS: Thoracic spine: Old mild T5 and moderate T12 compression fracture. Multilevel superior endplate Schmorl's node similar to prior MRI. No malalignment. Maintenance of thoracic kyphosis. Intervertebral disc heights preserved. No destructive bony lesions. Prevertebral and paraspinal soft tissue planes are  non-suspicious. Lumbar spine: Five non rib-bearing lumbar-type vertebral bodies are intact with maintenance of the lumbar lordosis. Stable severe L5-S1 disc height loss with endplate sclerosis compatible degenerative disc. Minimal grade 1 L5-S1 retrolisthesis. No destructive bony lesions. Sacroiliac joints are symmetric. Included prevertebral and paraspinal soft tissue planes are non-suspicious. Moderate vascular calcifications. IMPRESSION: Thoracic spine: No acute fracture or malalignment. Old mild T5 and moderate T12 compression fractures. Lumbar spine: No acute fracture. Grade 1 L5-S1 retrolisthesis without spondylolysis. Severe L5-S1 degenerative disc. Aortic Atherosclerosis (ICD10-I70.0). Electronically Signed   By: Elon Alas M.D.   On: 10/01/2017 23:12   Dg Lumbar Spine Complete  Result Date: 10/01/2017 CLINICAL DATA:  Fall from bed. Back pain. History of stroke and LEFT-sided weakness. EXAM: LUMBAR SPINE - COMPLETE 4+ VIEW; THORACIC SPINE 3 VIEWS COMPARISON:  Chest radiograph March 02, 2017 and MRI of the thoracic and lumbar spine Feb 10, 2017 FINDINGS: Thoracic spine: Old mild T5 and moderate T12 compression fracture. Multilevel superior endplate Schmorl's node similar to prior MRI. No malalignment. Maintenance of thoracic kyphosis. Intervertebral disc heights preserved. No destructive bony lesions. Prevertebral and paraspinal soft tissue planes are non-suspicious. Lumbar spine: Five non rib-bearing lumbar-type vertebral bodies are intact with maintenance of the lumbar lordosis. Stable severe L5-S1 disc height loss with endplate sclerosis compatible degenerative disc. Minimal grade 1 L5-S1 retrolisthesis. No destructive bony lesions. Sacroiliac joints are symmetric. Included prevertebral and paraspinal soft tissue planes are non-suspicious. Moderate vascular calcifications. IMPRESSION: Thoracic spine: No acute fracture or malalignment. Old mild T5 and moderate T12 compression fractures. Lumbar  spine: No acute fracture. Grade 1 L5-S1 retrolisthesis without spondylolysis. Severe L5-S1 degenerative disc. Aortic Atherosclerosis (ICD10-I70.0). Electronically Signed   By: Elon Alas M.D.   On: 10/01/2017 23:12   Dg Hip Unilat With Pelvis 2-3 Views Left  Result Date: 10/01/2017 CLINICAL DATA:  Increased left hip pain after fall today. EXAM: DG HIP (WITH OR WITHOUT PELVIS) 2-3V LEFT COMPARISON:  None. FINDINGS: There is no evidence of hip fracture or dislocation. There is no evidence of arthropathy or other focal bone abnormality. IMPRESSION: Negative. Electronically Signed   By: Abelardo Diesel M.D.   On: 10/01/2017 19:44    Procedures Procedures (including critical care time)  Medications Ordered in ED Medications  ketorolac (  TORADOL) injection 30 mg (30 mg Intramuscular Given 10/01/17 2228)  diazepam (VALIUM) tablet 5 mg (5 mg Oral Given 10/01/17 2228)     Initial Impression / Assessment and Plan / ED Course  I have reviewed the triage vital signs and the nursing notes.  Pertinent labs & imaging results that were available during my care of the patient were reviewed by me and considered in my medical decision making (see chart for details).     Patient in emergency department with worsening back and hip pain after a possible fall overnight.  She is on chronic pain medications, takes oxycodone for her pain.  She is very emotional, screaming and crying, constantly stating "I am not here for pain medicine" even though we have not even talked about her pain medications yet.  Exam was unremarkable.  Will get imaging of her spine and hip.  Labs already ordered at triage.  I will give her Valium for muscle spasms and Toradol for pain.   11:43 PM X-rays of the hip, lumbar spine, thoracic spine showing old compression fractures, degenerative disc disease, no acute findings.  Patient is feeling better with medications here. No concern for cauda equina.   Discussed discharge home with  NSAIDs, continue her oxycodone, will add a muscle relaxant.  We will have her follow-up with her doctor on Monday.  Return precautions discussed.  Vitals:   10/01/17 1845 10/01/17 1847 10/01/17 2215 10/02/17 0000  BP: (!) 169/146   (!) 155/98  Pulse: (!) 112   98  Resp: 16  14   Temp:  100 F (37.8 C)    TempSrc:  Oral    SpO2: 98%   96%     Final Clinical Impressions(s) / ED Diagnoses   Final diagnoses:  Contusion of left hip, initial encounter  Strain of lumbar region, initial encounter    ED Discharge Orders        Ordered    methocarbamol (ROBAXIN) 500 MG tablet  2 times daily     10/01/17 2345       Jeannett Senior, PA-C 10/02/17 0003    Gareth Morgan, MD 10/02/17 1410

## 2017-10-01 NOTE — ED Triage Notes (Signed)
Pt presents to ED with multiple complaints including:  Increase in falls in the past month, hx of stroke with right-sided deficits, but now increasing pain on left side, especially left hip after falls, having to pee a lot at night which requires her to get up which affects her ability to get good sleep, increasing memory impairment.  NAD at triage.  When asked "what made you decide you were having a medical emergency and needed to come to the ED" patient states "my hip".

## 2017-10-01 NOTE — Discharge Instructions (Signed)
Continue your pain medications at home.  Take Robaxin for muscle spasms as prescribed as needed.  Please follow-up with your family doctor on Monday if pain not improving.

## 2017-10-02 NOTE — ED Notes (Signed)
Pt stable and ambulatory for d/c. Pt understand follow up

## 2017-10-03 ENCOUNTER — Ambulatory Visit: Payer: Medicaid Other | Admitting: Occupational Therapy

## 2017-10-03 ENCOUNTER — Other Ambulatory Visit: Payer: Self-pay | Admitting: Family Medicine

## 2017-10-03 ENCOUNTER — Telehealth: Payer: Self-pay

## 2017-10-03 DIAGNOSIS — K219 Gastro-esophageal reflux disease without esophagitis: Secondary | ICD-10-CM

## 2017-10-03 DIAGNOSIS — I10 Essential (primary) hypertension: Secondary | ICD-10-CM

## 2017-10-03 MED ORDER — FAMOTIDINE 40 MG PO TABS: 40.0000 mg | ORAL_TABLET | Freq: Every day | ORAL | 1 refills | Status: DC

## 2017-10-03 MED ORDER — AMLODIPINE BESYLATE 10 MG PO TABS: 10.0000 mg | ORAL_TABLET | Freq: Every day | ORAL | 2 refills | Status: DC

## 2017-10-03 NOTE — Telephone Encounter (Signed)
Patient follows with Patient [facility]. Will forward there

## 2017-10-03 NOTE — Telephone Encounter (Signed)
Therapist called and spoke with patient re: today's appointment to confirm that she will be here since she cancelled last week. Pt reports that she will have to cancel today due to falling over the weekend and now has to see her PCP. Therapist offered to transfer patient to front office staff to reschedule for later this week, however pt refused and said she would call back to reschedule. Therapist did warn patient that she only has a limited time to use the 3 visits approved by Medicaid. Patient verbalized understanding, and said she would call back ASAP to reschedule for later this week.

## 2017-10-05 ENCOUNTER — Ambulatory Visit: Payer: Medicaid Other | Attending: Family Medicine | Admitting: Occupational Therapy

## 2017-10-05 DIAGNOSIS — G8929 Other chronic pain: Secondary | ICD-10-CM | POA: Insufficient documentation

## 2017-10-05 DIAGNOSIS — I69351 Hemiplegia and hemiparesis following cerebral infarction affecting right dominant side: Secondary | ICD-10-CM | POA: Diagnosis not present

## 2017-10-05 DIAGNOSIS — M25511 Pain in right shoulder: Secondary | ICD-10-CM | POA: Diagnosis present
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
## 2017-10-10 ENCOUNTER — Ambulatory Visit: Payer: Medicaid Other | Admitting: Occupational Therapy

## 2017-10-12 ENCOUNTER — Ambulatory Visit: Payer: Medicaid Other | Admitting: Family Medicine

## 2017-10-13 ENCOUNTER — Ambulatory Visit: Payer: Medicaid Other | Admitting: Neurology

## 2017-10-13 ENCOUNTER — Encounter: Payer: Self-pay | Admitting: Neurology

## 2017-10-13 ENCOUNTER — Telehealth: Payer: Self-pay | Admitting: *Deleted

## 2017-10-13 VITALS — BP 154/92 | HR 104 | Ht 64.0 in | Wt 127.0 lb

## 2017-10-13 DIAGNOSIS — I6522 Occlusion and stenosis of left carotid artery: Secondary | ICD-10-CM | POA: Diagnosis not present

## 2017-10-13 DIAGNOSIS — I63232 Cerebral infarction due to unspecified occlusion or stenosis of left carotid arteries: Secondary | ICD-10-CM

## 2017-10-13 DIAGNOSIS — E785 Hyperlipidemia, unspecified: Secondary | ICD-10-CM | POA: Diagnosis not present

## 2017-10-13 DIAGNOSIS — F17200 Nicotine dependence, unspecified, uncomplicated: Secondary | ICD-10-CM

## 2017-10-13 DIAGNOSIS — I10 Essential (primary) hypertension: Secondary | ICD-10-CM

## 2017-10-13 DIAGNOSIS — I69351 Hemiplegia and hemiparesis following cerebral infarction affecting right dominant side: Secondary | ICD-10-CM

## 2017-10-13 NOTE — Telephone Encounter (Signed)
See Dr Georgie Chard 10/13/17 note "PLAN: 1.  Patient may discontinue Plavix and continue aspirin at 325mg  daily.  Dual antiplatelet therapy is used for the first 3 months after an acute stroke."  I have spoken to patient and have scheduled colonoscopy on 11/08/17 at 200 pm and have scheduled a previsit on 10/20/17 at 11:00 am. Patient has been advised of time/date/location of these appointments and she verbalizes understanding.

## 2017-10-13 NOTE — Patient Instructions (Signed)
1.  You may stop Plavix.  Take aspirin 325mg  daily for secondary stroke prevention 2.  Continue atorvastatin for cholesterol, blood pressure medication as managed by your PCP 3.  Try to quit smoking. 4.  I will refer you to physical medicine and rehab for botox 5.  Follow up as needed.

## 2017-10-13 NOTE — Progress Notes (Signed)
NEUROLOGY CONSULTATION NOTE  Kelsey Wade MRN: [id number] DOB: August 11, 1961  Referring provider: Zenovia Jarred, MD Primary care provider: Elenore Paddy, FNP  Reason for consult:  Stroke, clearance for colonoscopy  HISTORY OF PRESENT ILLNESS: Kelsey Wade is a 57 year old right-handed female with hypertension, hyperlipidemia, tobacco dependence, chronic pain, depression and anxiety, and alcohol abuse who presents for for stroke.  History supplemented by hospital records.  Reason for referral from gastroenterology mentions clearance for a colonoscopy and whether to stop Plavix for the exam.  However, she reports that she already had the colonoscopy performed.  She was admitted to [facility] from 03/02/17 to 03/04/17 for stroke.  She presented with 2 to 3 days of right upper and lower extremity weakness and numbness with speech difficulty.  CT of head showed small acute infarct involving the precentral gyrus, as well as small acute infarct in the left caudate head.  MRI of brain confirmed subacute multifocal ischemic infarcts in the left frontal, parietal and temporal lobes, including the left precentral gyrus and left caudate head.  There were also chronic bilateral MCA territory infarcts as well.  MRA of head showed occlusion of the left internal carotid artery.  Follow up CTA of head and neck showed occluded left internal carotid artery at the origin due to atherosclerosis with reconstitution of the left supraclinoid internal carotid artery.  She also demonstrated moderate bilateral mid to distal posterior cerebral arteries stenosis and occluded left vertebral artery origin with thread reconstitution and severe right vertebral artery origin stenosis, all secondary to atherosclerosis.  Carotid doppler demonstrated occluded left internal carotid artery and 40-59% stenosis involving the distal right internal carotid artery and occluded right vertebral artery.  2D echocardiogram showed LV  EF 60-65% with no atrial septal defect/PFO or other cardiac source of emboli.  LDL was 79.  Hgb A1c was 5.5.  She previously had not been taking antiplatelet therapy, so she was started on dual antiplatelet therapy with Plavix.  She was started on atorvastatin 40mg  daily.  She still remains on ASA and Plavix.  She has significant spasticity in the right upper extremity, causing pain and discomfort.  She still smokes cigarettes, 1/2 a pack a day.  She drinks 3 beers a week.  She reports significant depression.  She was evaluated by gastroenterology for colon cancer screening and they would like clearance to stop Plavix in preparation for the exam. PAST MEDICAL HISTORY: Past Medical History:  Diagnosis Date  . Arthritis   . Chronic back pain   . Depression   . ETOH abuse   . GERD (gastroesophageal reflux disease)   . HTN (hypertension)   . Hyperlipidemia   . Stroke ([place])   . Tobacco abuse     PAST SURGICAL HISTORY: Past Surgical History:  Procedure Laterality Date  . APPENDECTOMY      MEDICATIONS: Current Outpatient Medications on File Prior to Visit  Medication Sig Dispense Refill  . amLODipine (NORVASC) 10 MG tablet Take 1 tablet (10 mg total) by mouth daily. 30 tablet 2  . aspirin EC 81 MG tablet Take 1 tablet daily along with plavix 75mg  daily (Patient taking differently: Take 81 mg by mouth daily. Take 1 tablet daily along with plavix 75mg  daily) 90 tablet 0  . aspirin-acetaminophen-caffeine (EXCEDRIN MIGRAINE) 250-250-65 MG tablet Take 2 tablets by mouth every 6 (six) hours as needed for headache.    Marland Kitchen atorvastatin (LIPITOR) 40 MG tablet Take 1 tablet (40 mg total) by mouth daily. 30 tablet  1  . busPIRone (BUSPAR) 10 MG tablet Take 1 tablet (10 mg total) by mouth 2 (two) times daily. (Patient taking differently: Take 10 mg by mouth See admin instructions. Take one tablet (10 mg) by mouth every morning, may also take one tablet (10 mg) in the evening as needed for depression) 60  tablet 5  . clopidogrel (PLAVIX) 75 MG tablet Take 1 tablet daily along with baby aspirin daily for 3 months. (Patient taking differently: Take 75 mg by mouth daily. Take 1 tablet daily along with baby aspirin daily for 3 months.) 90 tablet 0  . Cyanocobalamin (VITAMIN B12 PO) Take 1 tablet by mouth daily.    . DULoxetine (CYMBALTA) 20 MG capsule Take 1 capsule (20 mg total) by mouth 2 (two) times daily. (Patient not taking: Reported on 10/01/2017) 60 capsule 5  . famotidine (PEPCID) 40 MG tablet Take 1 tablet (40 mg total) by mouth at bedtime. 30 tablet 1  . gabapentin (NEURONTIN) 400 MG capsule TAKE 1 CAPSULE BY MOUTH 3 TIMES DAILY (Patient taking differently: TAKE 1 CAPSULE (400 MG)  BY MOUTH EVERY MORNING, MAY ALSO TAKE 1 CAPSULE (400 MG) IN THE EVENING AS NEEDED FOR PAIN) 90 capsule 2  . methocarbamol (ROBAXIN) 500 MG tablet Take 1 tablet (500 mg total) by mouth 2 (two) times daily. 20 tablet 0  . nicotine (NICODERM CQ - DOSED IN MG/24 HOURS) 21 mg/24hr patch Place 1 patch (21 mg total) onto the skin daily. (Patient taking differently: Place 21 mg onto the skin daily as needed (smoking cessation). ) 28 patch 1  . oxyCODONE (OXY IR/ROXICODONE) 5 MG immediate release tablet Take 5 mg by mouth every 6 (six) hours as needed (pain).   0  . sertraline (ZOLOFT) 50 MG tablet Take 50 mg by mouth at bedtime as needed (sleep).   0   No current facility-administered medications on file prior to visit.     ALLERGIES: No Known Allergies  FAMILY HISTORY: Family History  Problem Relation Age of Onset  . Alcohol abuse Father   . Alcohol abuse Brother   . Breast cancer Maternal Aunt     SOCIAL HISTORY: Social History   Socioeconomic History  . Marital status: Divorced    Spouse name: Not on file  . Number of children: 2  . Years of education: Not on file  . Highest education level: 9th grade  Social Needs  . Financial resource strain: Not on file  . Food insecurity - worry: Not on file  . Food  insecurity - inability: Not on file  . Transportation needs - medical: Not on file  . Transportation needs - non-medical: Not on file  Occupational History  . Occupation: disabled  Tobacco Use  . Smoking status: Current Every Day Smoker    Packs/day: 0.50    Types: Cigarettes  . Smokeless tobacco: Never Used  . Tobacco comment: less  Substance and Sexual Activity  . Alcohol use: Yes    Comment: Occasionally   . Drug use: Yes    Types: Marijuana    Comment: daily   . Sexual activity: Not on file  Other Topics Concern  . Not on file  Social History Narrative   Divorced, lives in a 1 story home, with 2 steps to enter. Drinks 2 cups of coffee, and one Mtn. Dew a day. Prior to becoming disabled by CVA, patient was employed in house keeping.     REVIEW OF SYSTEMS: Constitutional: No fevers, chills, or sweats, no  generalized fatigue, change in appetite Eyes: No visual changes, double vision, eye pain Ear, nose and throat: No hearing loss, ear pain, nasal congestion, sore throat Cardiovascular: No chest pain, palpitations Respiratory:  No shortness of breath at rest or with exertion, wheezes GastrointestinaI: No nausea, vomiting, diarrhea, abdominal pain, fecal incontinence Genitourinary:  No dysuria, urinary retention or frequency Musculoskeletal:  Generalized pain Integumentary: No rash, pruritus, skin lesions Neurological: as above Psychiatric: depression Endocrine: No palpitations, fatigue, diaphoresis, mood swings, change in appetite, change in weight, increased thirst Hematologic/Lymphatic:  No purpura, petechiae. Allergic/Immunologic: no itchy/runny eyes, nasal congestion, recent allergic reactions, rashes  PHYSICAL EXAM: Vitals:   10/13/17 0820  BP: (!) 154/92  Pulse: (!) 104  SpO2: 99%   General: No acute distress. Head:  Normocephalic/atraumatic Eyes:  fundi examined but not visualized Neck: supple, no paraspinal tenderness, full range of motion Back: No  paraspinal tenderness Heart: regular rate and rhythm Lungs: Clear to auscultation bilaterally. Vascular: No carotid bruits. Neurological Exam: Mental status: alert and oriented to person, place, and time, recent and remote memory intact, fund of knowledge intact, attention and concentration intact, speech fluent and not dysarthric, language intact. Cranial nerves: CN I: not tested CN II: pupils equal, round and reactive to light, visual fields intact CN III, IV, VI:  full range of motion, no nystagmus, no ptosis CN V: facial sensation intact CN VII: upper and lower face symmetric CN VIII: hearing intact CN IX, X: gag intact, uvula midline CN XI: sternocleidomastoid and trapezius muscles intact CN XII: tongue midline Bulk & Tone: spasticity in right upper extremity, increased tone in right lower extremity, no fasciculations. Motor:  Right upper extremity spasticity with flexion of the bicep, wrist and fingers.  5-/5 right deltoid and hip flexion. Sensation:  Pinprick sensation reduced in the right hand and foot and vibration sensation intact. . Deep Tendon Reflexes:  3+ right upper and lower extremities, 2+ let upper and lower extremities, toes downgoing. Finger to nose testing:  Without dysmetria.  Heel to shin:  Without dysmetria.  Gait:  Antalgic involving right hip/leg.  Able to turn but not tandem walk. Romberg negative.  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  Patient may discontinue Plavix and continue aspirin at 325mg  daily.  Dual antiplatelet therapy is used for the first 3 months after an acute stroke. 2.  Continue statin therapy as managed by PCP (LDL goal less than 70) 3.  Optimize blood pressure control.  Follow up blood pressure with PCP 4.  Smoking cessation instruction/counseling given:  counseled patient on the dangers of tobacco  use, advised patient to stop smoking, and reviewed strategies to maximize success. 5.  Refer patient to PM&R for botox regarding right upper extremity spasticity. 6.  No follow up needed.  Thank you for allowing me to take part in the care of this patient.  Metta Clines, DO  CC:  Lowella Fairy Jerilee Hoh, FNP  Zenovia Jarred, MD

## 2017-10-13 NOTE — Telephone Encounter (Signed)
-----   Message from Jerene Bears, MD sent at 10/13/2017  3:37 PM EST ----- Pt with + virtual colon Needs full colonoscopy scheduled Please see Dr. Georgie Chard note re: Plavix hold.  He has provided clearance, and she is to remain on ASA 325 mg daily throughout the colonoscopy pre and post-procedure period Thanks

## 2017-10-14 NOTE — Progress Notes (Signed)
Kelsey Wade , thanks - and pt aware to hold Plavix and continue ASA correct ?

## 2017-10-17 ENCOUNTER — Ambulatory Visit: Payer: Medicaid Other | Admitting: Occupational Therapy

## 2017-10-17 DIAGNOSIS — I69351 Hemiplegia and hemiparesis following cerebral infarction affecting right dominant side: Secondary | ICD-10-CM

## 2017-10-17 DIAGNOSIS — G8929 Other chronic pain: Secondary | ICD-10-CM

## 2017-10-17 DIAGNOSIS — M25511 Pain in right shoulder: Secondary | ICD-10-CM
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
## 2017-10-20 ENCOUNTER — Other Ambulatory Visit: Payer: Self-pay

## 2017-10-20 ENCOUNTER — Ambulatory Visit (AMBULATORY_SURGERY_CENTER): Payer: Self-pay | Admitting: *Deleted

## 2017-10-20 VITALS — Ht 64.0 in | Wt 131.0 lb

## 2017-10-20 DIAGNOSIS — R933 Abnormal findings on diagnostic imaging of other parts of digestive tract: Secondary | ICD-10-CM

## 2017-10-20 MED ORDER — NA SULFATE-K SULFATE-MG SULF 17.5-3.13-1.6 GM/177ML PO SOLN: 1.0000 | Freq: Once | ORAL | 0 refills | Status: AC

## 2017-10-20 NOTE — Progress Notes (Signed)
Pt is off Plavix per prescribing MD completely

## 2017-10-25 ENCOUNTER — Encounter: Payer: Self-pay | Admitting: Physical Medicine & Rehabilitation

## 2017-10-25 ENCOUNTER — Ambulatory Visit: Payer: Medicaid Other | Admitting: Physical Medicine & Rehabilitation

## 2017-10-25 ENCOUNTER — Encounter: Payer: Medicaid Other | Attending: Physical Medicine & Rehabilitation

## 2017-10-25 VITALS — BP 146/83 | HR 91 | Resp 14

## 2017-10-25 DIAGNOSIS — R252 Cramp and spasm: Secondary | ICD-10-CM | POA: Insufficient documentation

## 2017-10-25 DIAGNOSIS — G8110 Spastic hemiplegia affecting unspecified side: Secondary | ICD-10-CM | POA: Diagnosis not present

## 2017-10-25 DIAGNOSIS — G8929 Other chronic pain: Secondary | ICD-10-CM | POA: Insufficient documentation

## 2017-10-25 DIAGNOSIS — I69354 Hemiplegia and hemiparesis following cerebral infarction affecting left non-dominant side: Secondary | ICD-10-CM | POA: Insufficient documentation

## 2017-10-25 DIAGNOSIS — M79641 Pain in right hand: Secondary | ICD-10-CM | POA: Diagnosis not present

## 2017-10-25 NOTE — Progress Notes (Signed)
Subjective:    Patient ID: Kelsey Wade, female    DOB: 1960-12-06, 57 y.o.   MRN: [id number]  HPI  Referred by Dr. Tomi Likens from neurology CC Right hand spasms 57yo female with hx of CVA causing RUE.RLE weakness She was admitted to [facility] with subacute onset of right-sided weakness on 03/02/2017.  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  2D echo showed no evidence of cardiac embolic source, a statin was prescribed, dual antiplatelet therapy was recommended with Plavix and baby aspirin.  Patient was advised to discontinue tobacco use Received Home Health PT, OT, was referred to outpatient PT OT on 06/10/2017  Her PCP is Cammie Sickle, follow-up in July 2018 had a chief complaint of right hand pain.  Prescribed Tylenol 3 and Toradol injection  Subsequent fall with ED visit 10/01/2017 with contusion of the left hip but no other injuries.  Had Right shoulder and Right hip cortisone injection without improvement  Uses cane for ambulation, no severe weakness in leg but balance is poor Needs help for showering due to balance disorder  Visit with Dr. 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  New Patient, botox evaluation, under contract with [facility] for pain management  Pain Inventory Average Pain 9 Pain Right Now 9 My pain is constant, sharp, burning, stabbing, tingling and aching  In the last 24 hours, has pain interfered with the following? General activity 9 Relation with others 9 Enjoyment of life 10 What TIME of day is your pain at its worst? all Sleep (in general) Poor  Pain is worse with: walking, bending,  sitting, standing, unsure and some activites Pain improves with: medication Relief from Meds: 2  Mobility walk without assistance use a cane use a walker how many minutes can you walk? 5 ability to climb steps?  no do you drive?  no use a wheelchair needs help with transfers  Function not employed: date last employed . disabled: date disabled . I need assistance with the following:  feeding, dressing, bathing, meal prep, household duties and shopping  Neuro/Psych weakness numbness tremor tingling trouble walking spasms dizziness confusion depression anxiety  Prior Studies new visits  Physicians involved in your care new visits   Family History  Problem Relation Age of Onset  . Alcohol abuse Father   . Alcohol abuse Brother   . Breast cancer Maternal Aunt   . Colon cancer Neg Hx   . Esophageal cancer Neg Hx   . Stomach cancer Neg Hx   . Rectal cancer Neg Hx    Social History   Socioeconomic History  . Marital status: Divorced    Spouse name: None  . Number of children: 2  . Years of education: None  . Highest education level: 9th grade  Social Needs  . Financial resource strain: None  . Food insecurity - worry: None  . Food insecurity - inability: None  . Transportation needs - medical: None  . Transportation needs - non-medical: None  Occupational History  . Occupation: disabled  Tobacco Use  . Smoking status: Current Every Day Smoker    Packs/day: 0.50    Types: Cigarettes  . Smokeless tobacco: Never Used  .  Tobacco comment: less  Substance and Sexual Activity  . Alcohol use: Yes    Comment: Occasionally   . Drug use: Yes    Types: Marijuana    Comment: quit 2 months ago 10-20-17  . Sexual activity: None  Other Topics Concern  . None  Social History Narrative   Divorced, lives in a 1 story home, with 2 steps to enter. Drinks 2 cups of coffee, and one Mtn. Dew a day. Prior to becoming disabled by CVA, patient was employed in house  keeping.    Past Surgical History:  Procedure Laterality Date  . APPENDECTOMY     Past Medical History:  Diagnosis Date  . Anxiety   . Arthritis   . Chronic back pain   . Depression   . ETOH abuse   . GERD (gastroesophageal reflux disease)   . HTN (hypertension)   . Hyperlipidemia   . Stroke [facility])    June 2018  . Tobacco abuse    BP (!) 146/83   Pulse 91   Resp 14   SpO2 97%   Opioid Risk Score:   Fall Risk Score:  `1  Depression screen PHQ 2/9  Depression screen [facility] 2/9 08/10/2017 06/10/2017 06/10/2017 04/15/2017 03/15/2017 02/17/2017 01/18/2017  Decreased Interest 0 2 0 0 1 1 1   Down, Depressed, Hopeless 1 3 3 3 1 1 1   PHQ - 2 Score 1 5 3 3 2 2 2   Altered sleeping - 3 3 3  - - 1  Tired, decreased energy - 3 3 3  - - 1  Change in appetite - - 0 0 - - 1  Feeling bad or failure about yourself  - 3 3 3  - - 1  Trouble concentrating - 3 3 3  - - 1  Moving slowly or fidgety/restless - 3 3 3  - - 1  Suicidal thoughts - - 0 0 - - 0  PHQ-9 Score - 20 18 18  - - 8  Difficult doing work/chores - - - Not difficult at all - - -     Review of Systems  Constitutional: Positive for appetite change, diaphoresis and unexpected weight change.  HENT: Negative.   Musculoskeletal: Positive for arthralgias, back pain, gait problem and neck pain.       Spasms   Skin: Negative.   Neurological: Positive for dizziness, tremors, weakness and numbness.       Tingling  Hematological: Bruises/bleeds easily.  Psychiatric/Behavioral: Positive for confusion and dysphoric mood. The patient is nervous/anxious.        Objective:   Physical Exam  Constitutional: She is oriented to person, place, and time. She appears well-developed and well-nourished. No distress.  HENT:  Head: Normocephalic and atraumatic.  Eyes: Conjunctivae and EOM are normal. Pupils are equal, round, and reactive to light.  Neck: Normal range of motion. Neck supple.  Cardiovascular: Normal rate, regular rhythm and normal heart  sounds.  Pulmonary/Chest: Effort normal and breath sounds normal. No respiratory distress. She has no wheezes.  Abdominal: Soft. Bowel sounds are normal. She exhibits no distension. There is no tenderness.  Musculoskeletal:  Patient has pain with passive extension of the right wrist as well as finger flexors.  Neurological: She is alert and oriented to person, place, and time.  Motor strength is 3- in the right deltoid 2- at the right biceps and triceps inhibited by flexor tone, 0 at the finger extensors and finger flexors, 0 at the wrist flexors and extensors on the right side 5 at the  right hip flexor knee extensor and 4 at the right ankle dorsiflexor 5/5 in left deltoid, bicep, tricep, grip, hip flexor, knee extensor, ankle dorsiflexor Tone Modified Ashworth score 3 right elbow flexors, 3 for right wrist flexors 4 at finger flexors digits 4 and 3 and 2 at digits 2 and 5, both FDP FDS involved Pronator MAS 3 on the right side Normal tone left upper extremity and bilateral lower extremities Gait is without evidence of toe drag or knee instability she uses a cane held in her left hand.   Skin: Skin is warm and dry. She is not diaphoretic.  Psychiatric: She has a normal mood and affect.  Nursing note and vitals reviewed.         Assessment & Plan:  1.  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  She also has elbow flexor spasticity although this is not as painful.  She has probable contracture at the third and fourth digits of the right hand although difficult to fully evaluate secondary to pain Recommend botulinum toxin with the following dose Right biceps 100  Right FCU 50 Right FCR 50 Right FDS 50 Right FDP 50 As discussed with patient may need to titrate dose upward depending on results

## 2017-10-25 NOTE — Patient Instructions (Signed)
Will inject R biceps, wrist and finger flexors, should help with trimming nails and reduce hand and wrist pain

## 2017-11-03 ENCOUNTER — Ambulatory Visit (INDEPENDENT_AMBULATORY_CARE_PROVIDER_SITE_OTHER): Payer: Medicaid Other | Admitting: Psychiatry

## 2017-11-03 ENCOUNTER — Encounter (HOSPITAL_COMMUNITY): Payer: Self-pay | Admitting: Psychiatry

## 2017-11-03 VITALS — BP 120/78 | HR 77 | Ht 64.0 in | Wt 133.0 lb

## 2017-11-03 DIAGNOSIS — Z560 Unemployment, unspecified: Secondary | ICD-10-CM

## 2017-11-03 DIAGNOSIS — R450 Nervousness: Secondary | ICD-10-CM

## 2017-11-03 DIAGNOSIS — M549 Dorsalgia, unspecified: Secondary | ICD-10-CM | POA: Diagnosis not present

## 2017-11-03 DIAGNOSIS — Z811 Family history of alcohol abuse and dependence: Secondary | ICD-10-CM

## 2017-11-03 DIAGNOSIS — F4010 Social phobia, unspecified: Secondary | ICD-10-CM

## 2017-11-03 DIAGNOSIS — F1290 Cannabis use, unspecified, uncomplicated: Secondary | ICD-10-CM | POA: Diagnosis not present

## 2017-11-03 DIAGNOSIS — G4700 Insomnia, unspecified: Secondary | ICD-10-CM

## 2017-11-03 DIAGNOSIS — F331 Major depressive disorder, recurrent, moderate: Secondary | ICD-10-CM | POA: Diagnosis not present

## 2017-11-03 DIAGNOSIS — Z91411 Personal history of adult psychological abuse: Secondary | ICD-10-CM

## 2017-11-03 DIAGNOSIS — Z91410 Personal history of adult physical and sexual abuse: Secondary | ICD-10-CM | POA: Diagnosis not present

## 2017-11-03 DIAGNOSIS — M2550 Pain in unspecified joint: Secondary | ICD-10-CM

## 2017-11-03 DIAGNOSIS — F411 Generalized anxiety disorder: Secondary | ICD-10-CM

## 2017-11-03 DIAGNOSIS — Z8679 Personal history of other diseases of the circulatory system: Secondary | ICD-10-CM

## 2017-11-03 DIAGNOSIS — F17210 Nicotine dependence, cigarettes, uncomplicated: Secondary | ICD-10-CM

## 2017-11-03 MED ORDER — HYDROXYZINE PAMOATE 25 MG PO CAPS: ORAL_CAPSULE | ORAL | 0 refills | Status: DC

## 2017-11-03 MED ORDER — LAMOTRIGINE 25 MG PO TABS: ORAL_TABLET | ORAL | 1 refills | Status: DC
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
## 2017-11-04 ENCOUNTER — Telehealth: Payer: Self-pay

## 2017-11-04 NOTE — Telephone Encounter (Signed)
Called Pt, LM on VM about Tues appt. If referring Dr Berna Spare she's had another stroke, they will need to order MRI prior to being seen here. If being seen for memory issues per referring Dr's recommendation, will need to see Dr Si Raider first.

## 2017-11-07 NOTE — Telephone Encounter (Signed)
Called Pt, LM on VM advising her appt has been canceled for 11/08/17 @ 11a

## 2017-11-08 ENCOUNTER — Ambulatory Visit (AMBULATORY_SURGERY_CENTER): Payer: Medicaid Other | Admitting: Internal Medicine

## 2017-11-08 ENCOUNTER — Ambulatory Visit: Payer: Self-pay | Admitting: Neurology

## 2017-11-08 ENCOUNTER — Other Ambulatory Visit: Payer: Self-pay

## 2017-11-08 ENCOUNTER — Encounter: Payer: Self-pay | Admitting: Internal Medicine

## 2017-11-08 VITALS — BP 138/82 | HR 73 | Temp 97.5°F | Resp 20 | Ht 64.0 in | Wt 131.0 lb

## 2017-11-08 DIAGNOSIS — D122 Benign neoplasm of ascending colon: Secondary | ICD-10-CM | POA: Diagnosis not present

## 2017-11-08 DIAGNOSIS — D123 Benign neoplasm of transverse colon: Secondary | ICD-10-CM

## 2017-11-08 DIAGNOSIS — R933 Abnormal findings on diagnostic imaging of other parts of digestive tract: Secondary | ICD-10-CM | POA: Diagnosis not present

## 2017-11-08 MED ORDER — SODIUM CHLORIDE 0.9 % IV SOLN: 500.0000 mL | Freq: Once | INTRAVENOUS | Status: DC

## 2017-11-08 NOTE — Op Note (Signed)
[facility] Patient Name: Kelsey Wade Procedure Date: 11/08/2017 1:44 PM MRN: [id number] Endoscopist: Jerene Bears , MD Age: 57 Referring MD:  Date of Birth: 1960/10/07 Gender: Female Account #: [id number] Procedure:                Colonoscopy Indications:              Abnormal virtual colonoscopy Medicines:                Monitored Anesthesia Care Procedure:                Pre-Anesthesia Assessment:                           - Prior to the procedure, a History and Physical                            was performed, and patient medications and                            allergies were reviewed. The patient's tolerance of                            previous anesthesia was also reviewed. The risks                            and benefits of the procedure and the sedation                            options and risks were discussed with the patient.                            All questions were answered, and informed consent                            was obtained. Prior Anticoagulants: The patient has                            taken no previous anticoagulant or antiplatelet                            agents. ASA Grade Assessment: III - A patient with                            severe systemic disease. After reviewing the risks                            and benefits, the patient was deemed in                            satisfactory condition to undergo the procedure.                           After obtaining informed consent, the colonoscope  was passed under direct vision. Throughout the                            procedure, the patient's blood pressure, pulse, and                            oxygen saturations were monitored continuously. The                            Colonoscope was introduced through the anus and                            advanced to the the cecum, identified by                            appendiceal orifice and ileocecal  valve. The                            colonoscopy was performed without difficulty. The                            patient tolerated the procedure well. The quality                            of the bowel preparation was good. The ileocecal                            valve, appendiceal orifice, and rectum were                            photographed. Scope In: 1:48:16 PM Scope Out: 2:07:15 PM Scope Withdrawal Time: 0 hours 14 minutes 54 seconds  Total Procedure Duration: 0 hours 18 minutes 59 seconds  Findings:                 The digital rectal exam was normal.                           A 12 mm polyp was found in the proximal ascending                            colon. The polyp was sessile. The polyp was removed                            with a cold snare. Resection and retrieval were                            complete.                           A 5 mm polyp was found in the hepatic flexure. The                            polyp was sessile. The polyp was removed with a  cold snare. Resection and retrieval were complete.                           A 5 mm polyp was found in the transverse colon. The                            polyp was sessile. The polyp was removed with a                            cold snare. Resection and retrieval were complete.                           A few small-mouthed diverticula were found in the                            sigmoid colon.                           Internal hemorrhoids were found during                            retroflexion. The hemorrhoids were medium-sized. Complications:            No immediate complications. Estimated Blood Loss:     Estimated blood loss was minimal. Impression:               - One 12 mm polyp in the proximal ascending colon,                            removed with a cold snare. Resected and retrieved.                           - One 5 mm polyp at the hepatic flexure, removed                             with a cold snare. Resected and retrieved.                           - One 5 mm polyp in the transverse colon, removed                            with a cold snare. Resected and retrieved.                           - Diverticulosis in the sigmoid colon.                           - Internal hemorrhoids. Recommendation:           - Patient has a contact number available for                            emergencies. The signs and symptoms of potential  delayed complications were discussed with the                            patient. Return to normal activities tomorrow.                            Written discharge instructions were provided to the                            patient.                           - Resume previous diet.                           - Continue present medications.                           - Await pathology results.                           - Repeat colonoscopy is recommended. The                            colonoscopy date will be determined after pathology                            results from today's exam become available for                            review. Jerene Bears, MD 11/08/2017 2:13:58 PM This report has been signed electronically.

## 2017-11-08 NOTE — Progress Notes (Signed)
Report to PACU, RN, vss, BBS= Clear.  

## 2017-11-08 NOTE — Patient Instructions (Signed)
YOU HAD AN ENDOSCOPIC PROCEDURE TODAY AT [facility]:   Refer to the procedure report that was given to you for any specific questions about what was found during the examination.  If the procedure report does not answer your questions, please call your gastroenterologist to clarify.  If you requested that your care partner not be given the details of your procedure findings, then the procedure report has been included in a sealed envelope for you to review at your convenience later.  YOU SHOULD EXPECT: Some feelings of bloating in the abdomen. Passage of more gas than usual.  Walking can help get rid of the air that was put into your GI tract during the procedure and reduce the bloating. If you had a lower endoscopy (such as a colonoscopy or flexible sigmoidoscopy) you may notice spotting of blood in your stool or on the toilet paper. If you underwent a bowel prep for your procedure, you may not have a normal bowel movement for a few days.  Please Note:  You might notice some irritation and congestion in your nose or some drainage.  This is from the oxygen used during your procedure.  There is no need for concern and it should clear up in a day or so.  SYMPTOMS TO REPORT IMMEDIATELY:   Following lower endoscopy (colonoscopy or flexible sigmoidoscopy):  Excessive amounts of blood in the stool  Significant tenderness or worsening of abdominal pains  Swelling of the abdomen that is new, acute  Fever of 100F or higher   Following upper endoscopy (EGD)  Vomiting of blood or coffee ground material  New chest pain or pain under the shoulder blades  Painful or persistently difficult swallowing  New shortness of breath  Fever of 100F or higher  Black, tarry-looking stools  For urgent or emergent issues, a gastroenterologist can be reached at any hour by calling [phone].   DIET:  We do recommend a small meal at first, but then you may proceed to your regular diet.  Drink  plenty of fluids but you should avoid alcoholic beverages for 24 hours.  ACTIVITY:  You should plan to take it easy for the rest of today and you should NOT DRIVE or use heavy machinery until tomorrow (because of the sedation medicines used during the test).    FOLLOW UP: Our staff will call the number listed on your records the next business day following your procedure to check on you and address any questions or concerns that you may have regarding the information given to you following your procedure. If we do not reach you, we will leave a message.  However, if you are feeling well and you are not experiencing any problems, there is no need to return our call.  We will assume that you have returned to your regular daily activities without incident.  If any biopsies were taken you will be contacted by phone or by letter within the next 1-3 weeks.  Please call us at [phone] if you have not heard about the biopsies in 3 weeks.    SIGNATURES/CONFIDENTIALITY: You and/or your care partner have signed paperwork which will be entered into your electronic medical record.  These signatures attest to the fact that that the information above on your After Visit Summary has been reviewed and is understood.  Full responsibility of the confidentiality of this discharge information lies with you and/or your care-partner.  Polyp, diverticulosis and hemorrhoid information given.

## 2017-11-08 NOTE — Progress Notes (Signed)
Called to room to assist during endoscopic procedure.  Patient ID and intended procedure confirmed with present staff. Received instructions for my participation in the procedure from the performing physician.  

## 2017-11-09 ENCOUNTER — Telehealth: Payer: Self-pay

## 2017-11-09 NOTE — Telephone Encounter (Signed)
Attempted to reach patient for post-procedure f/u call. No answer. Left message that we will make another attempt to reach her again later today and for her to please not hesitate to call us if she has any questions/concerns regarding her care. 

## 2017-11-09 NOTE — Telephone Encounter (Signed)
  Follow up Call-  Call back number 11/08/2017  Post procedure Call Back phone  # [phone]  Permission to leave phone message Yes     Patient questions:  Do you have a fever, pain , or abdominal swelling? No. Pain Score  0 *  Have you tolerated food without any problems? Yes.    Have you been able to return to your normal activities? Yes.    Do you have any questions about your discharge instructions: Diet   No. Medications  No. Follow up visit  No.  Do you have questions or concerns about your Care? No.  Actions: * If pain score is 4 or above: No action needed, pain <4.

## 2017-11-09 NOTE — Telephone Encounter (Signed)
Patient calling back states she is doing great.

## 2017-11-11 ENCOUNTER — Encounter: Payer: Self-pay | Admitting: Physical Medicine & Rehabilitation

## 2017-11-11 ENCOUNTER — Ambulatory Visit: Payer: Medicaid Other | Admitting: Physical Medicine & Rehabilitation

## 2017-11-11 VITALS — BP 129/81 | HR 87

## 2017-11-11 DIAGNOSIS — G8110 Spastic hemiplegia affecting unspecified side: Secondary | ICD-10-CM

## 2017-11-11 DIAGNOSIS — I69354 Hemiplegia and hemiparesis following cerebral infarction affecting left non-dominant side: Secondary | ICD-10-CM | POA: Diagnosis not present

## 2017-11-11 NOTE — Patient Instructions (Signed)

## 2017-11-11 NOTE — Progress Notes (Signed)
Botox Injection for spasticity using needle EMG guidance  Dilution: 50 Units/ml Indication: Severe spasticity which interferes with ADL,mobility and/or  hygiene and is unresponsive to medication management and other conservative care Informed consent was obtained after describing risks and benefits of the procedure with the patient. This includes bleeding, bruising, infection, excessive weakness, or medication side effects. A REMS form is on file and signed. Needle: 27g 1inch needle electrode Number of units per muscle  Right biceps 100  Right FCU 50 Right FCR 50 Right FDS 50 Right FDP 50  All injections were done after obtaining appropriate EMG activity and after negative drawback for blood. The patient tolerated the procedure well. Post procedure instructions were given. A followup appointment was made.

## 2017-11-15 ENCOUNTER — Telehealth: Payer: Self-pay

## 2017-11-15 NOTE — Telephone Encounter (Signed)
This is unusual after Botox and in fact I have never seen that.  Please have pt see me later this week or she can see her PCP

## 2017-11-15 NOTE — Telephone Encounter (Signed)
Patient called, stated is having severe swelling in her arm after receiving botox injections.

## 2017-11-16 ENCOUNTER — Encounter: Payer: Self-pay | Admitting: Internal Medicine

## 2017-11-16 NOTE — Telephone Encounter (Signed)
Spoke with patient, expressed Dr. Letta Pate thoughts, got patient on the schedule Monday 11/21/2017 at 12:45 pm.  Patient will try PCP for sooner visit. If successful will call to inform.  If not she will be here Monday.  Swelling is located at the wrist.  Patient reports she is unable to move her hand. Forward to Dr. Letta Pate .....FYI

## 2017-11-17 NOTE — Telephone Encounter (Signed)
On my initial eval 2/5 pt had no movement in the right hand and wrist due to her stroke

## 2017-11-21 ENCOUNTER — Ambulatory Visit: Payer: Self-pay | Admitting: Physical Medicine & Rehabilitation

## 2017-11-25 ENCOUNTER — Encounter (HOSPITAL_COMMUNITY): Payer: Self-pay | Admitting: Psychiatry

## 2017-11-25 ENCOUNTER — Ambulatory Visit (HOSPITAL_COMMUNITY): Payer: Medicaid Other | Admitting: Psychiatry

## 2017-11-25 VITALS — BP 128/72 | HR 68 | Ht 64.0 in | Wt 130.0 lb

## 2017-11-25 DIAGNOSIS — Z560 Unemployment, unspecified: Secondary | ICD-10-CM | POA: Diagnosis not present

## 2017-11-25 DIAGNOSIS — G4700 Insomnia, unspecified: Secondary | ICD-10-CM

## 2017-11-25 DIAGNOSIS — G8929 Other chronic pain: Secondary | ICD-10-CM

## 2017-11-25 DIAGNOSIS — F411 Generalized anxiety disorder: Secondary | ICD-10-CM | POA: Diagnosis not present

## 2017-11-25 DIAGNOSIS — F332 Major depressive disorder, recurrent severe without psychotic features: Secondary | ICD-10-CM | POA: Diagnosis not present

## 2017-11-25 DIAGNOSIS — Z8673 Personal history of transient ischemic attack (TIA), and cerebral infarction without residual deficits: Secondary | ICD-10-CM

## 2017-11-25 DIAGNOSIS — F17210 Nicotine dependence, cigarettes, uncomplicated: Secondary | ICD-10-CM

## 2017-11-25 DIAGNOSIS — R413 Other amnesia: Secondary | ICD-10-CM

## 2017-11-25 DIAGNOSIS — Z811 Family history of alcohol abuse and dependence: Secondary | ICD-10-CM

## 2017-11-25 DIAGNOSIS — M549 Dorsalgia, unspecified: Secondary | ICD-10-CM

## 2017-11-25 DIAGNOSIS — R450 Nervousness: Secondary | ICD-10-CM

## 2017-11-25 DIAGNOSIS — Z736 Limitation of activities due to disability: Secondary | ICD-10-CM

## 2017-11-25 MED ORDER — VENLAFAXINE HCL ER 37.5 MG PO CP24: ORAL_CAPSULE | ORAL | 0 refills | Status: DC

## 2017-11-25 MED ORDER — HYDROXYZINE PAMOATE 50 MG PO CAPS: ORAL_CAPSULE | ORAL | 0 refills | Status: DC

## 2017-11-25 NOTE — Progress Notes (Signed)
BH MD/PA/NP OP Progress Note  11/25/2017 9:04 AM Kelsey Wade  MRN:  [id number]  Chief Complaint: I do not like Lamictal.  It was causing me confusion, dizziness and hurting all over the body.  HPI: Kelsey Wade is 57 year old Caucasian unemployed married female who was seen first time more than 3 weeks ago for initial evaluation.  Patient was referred from [facility].  Patient has history of depression, anxiety and lately symptoms started to get worse.  She has been irritable, frustrated, crying spells, feeling hopeless helpless and worthless.  Patient has a stroke causes right-sided weakness.  Patient feels a burden to her husband.  She feels very isolated withdrawn and lost her freedom.  Patient also have memory impairment, chronic pain, tingling and neuropathy.  She cannot drive and she is dependent on her husband.  In the past she had tried Cymbalta, BuSpar, trazodone, Wellbutrin and Celexa.  We started her on Lamictal and hydroxyzine.  After taking for 1 week she noticed worsening of dizziness, hurting all over the body, confusion and she decided to stop.  She also read the side effects and realized that she is taking the medication for bipolar disorder and she does not believe she has bipolar disorder.  Patient denies any paranoia or any hallucination but gets easily irritable and frustrated.  She is taking Vistaril 25 mg at night which is helping few hours of sleep.  She endorsed having racing thoughts and she worried about her future.  Patient was given recently Botox for right hand contractures but she endorsed having swelling after the injection.  She was given steroids to reduce swelling and her Neurontin dose was increased.  Patient is back on Celexa 20 mg but she does not feel it is working.  Patient denies any suicidal thoughts or homicidal thought.  Patient moved from [place] 2 years ago to live closer to her family.  She has a daughter who lives in [place] but she has not talked to  her in past 2 years.  She has another daughter who lives in [place] but recently she got engaged and patient complained that after the engagement she does not have time to talk to her mother.  She like to try a different medication.  Patient denies drinking alcohol or using any illegal substances.  Visit Diagnosis:    ICD-10-CM   1. GAD (generalized anxiety disorder) F41.1 venlafaxine XR (EFFEXOR XR) 37.5 MG 24 hr capsule    hydrOXYzine (VISTARIL) 50 MG capsule    Past Psychiatric History:  Patient denies any history of psychiatric inpatient treatment or any suicidal time.  She had a history of irritability, anger, mood swings, depression symptoms started to get worse after the stroke in 2018.  She had tried Cymbalta, Zoloft, Celexa, trazodone, amitriptyline, BuSpar, Wellbutrin with limited effect.  Recently we tried Lamictal but she had side effects including dizziness, hurting all over the body and confusion.  Past Medical History:  Past Medical History:  Diagnosis Date  . Anxiety   . Arthritis   . Chronic back pain   . Depression   . ETOH abuse   . GERD (gastroesophageal reflux disease)   . HTN (hypertension)   . Hyperlipidemia   . Stroke [facility])    June 2018  . Tobacco abuse     Past Surgical History:  Procedure Laterality Date  . APPENDECTOMY      Family Psychiatric History: Reviewed.  Family History:  Family History  Problem Relation Age of Onset  . Alcohol  abuse Father   . Alcohol abuse Brother   . Breast cancer Maternal Aunt   . Colon cancer Neg Hx   . Esophageal cancer Neg Hx   . Stomach cancer Neg Hx   . Rectal cancer Neg Hx     Social History:  Social History   Socioeconomic History  . Marital status: Divorced    Spouse name: Not on file  . Number of children: 2  . Years of education: Not on file  . Highest education level: 9th grade  Social Needs  . Financial resource strain: Not on file  . Food insecurity - worry: Not on file  . Food insecurity  - inability: Not on file  . Transportation needs - medical: Not on file  . Transportation needs - non-medical: Not on file  Occupational History  . Occupation: disabled  Tobacco Use  . Smoking status: Current Every Day Smoker    Packs/day: 0.50    Types: Cigarettes  . Smokeless tobacco: Never Used  . Tobacco comment: less  Substance and Sexual Activity  . Alcohol use: Yes    Comment: Occasionally   . Drug use: Yes    Types: Marijuana    Comment: quit 2 months ago 10-20-17  . Sexual activity: Not on file  Other Topics Concern  . Not on file  Social History Narrative   Divorced, lives in a 1 story home, with 2 steps to enter. Drinks 2 cups of coffee, and one Mtn. Dew a day. Prior to becoming disabled by CVA, patient was employed in house keeping.     Allergies: No Known Allergies  Metabolic Disorder Labs: Recent Results (from the past 2160 hour(s))  Comprehensive metabolic panel     Status: Abnormal   Collection Time: 10/01/17  6:55 PM  Result Value Ref Range   Sodium 139 135 - 145 mmol/L   Potassium 3.4 (L) 3.5 - 5.1 mmol/L   Chloride 106 101 - 111 mmol/L   CO2 23 22 - 32 mmol/L   Glucose, Bld 134 (H) 65 - 99 mg/dL   BUN 22 (H) 6 - 20 mg/dL   Creatinine, Ser 0.71 0.44 - 1.00 mg/dL   Calcium 9.0 8.9 - 10.3 mg/dL   Total Protein 6.8 6.5 - 8.1 g/dL   Albumin 4.0 3.5 - 5.0 g/dL   AST 108 (H) 15 - 41 U/L   ALT 43 14 - 54 U/L   Alkaline Phosphatase 66 38 - 126 U/L   Total Bilirubin 0.8 0.3 - 1.2 mg/dL   GFR calc non Af Amer >60 >60 mL/min   GFR calc Af Amer >60 >60 mL/min    Comment: (NOTE) The eGFR has been calculated using the CKD EPI equation. This calculation has not been validated in all clinical situations. eGFR's persistently <60 mL/min signify possible Chronic Kidney Disease.    Anion gap 10 5 - 15  CBC     Status: None   Collection Time: 10/01/17  6:55 PM  Result Value Ref Range   WBC 8.5 4.0 - 10.5 K/uL   RBC 4.95 3.87 - 5.11 MIL/uL   Hemoglobin 14.8 12.0  - 15.0 g/dL   HCT 45.6 36.0 - 46.0 %   MCV 92.1 78.0 - 100.0 fL   MCH 29.9 26.0 - 34.0 pg   MCHC 32.5 30.0 - 36.0 g/dL   RDW 14.5 11.5 - 15.5 %   Platelets 246 150 - 400 K/uL  I-Stat beta hCG blood, ED     Status: None  Collection Time: 10/01/17  7:02 PM  Result Value Ref Range   I-stat hCG, quantitative <5.0 <5 mIU/mL   Comment 3            Comment:   GEST. AGE      CONC.  (mIU/mL)   <=1 WEEK        5 - 50     2 WEEKS       50 - 500     3 WEEKS       100 - 10,000     4 WEEKS     1,000 - 30,000        FEMALE AND NON-PREGNANT FEMALE:     LESS THAN 5 mIU/mL   Urinalysis, Routine w reflex microscopic     Status: None   Collection Time: 10/01/17  7:46 PM  Result Value Ref Range   Color, Urine YELLOW YELLOW   APPearance CLEAR CLEAR   Specific Gravity, Urine 1.025 1.005 - 1.030   pH 5.0 5.0 - 8.0   Glucose, UA NEGATIVE NEGATIVE mg/dL   Hgb urine dipstick NEGATIVE NEGATIVE   Bilirubin Urine NEGATIVE NEGATIVE   Ketones, ur NEGATIVE NEGATIVE mg/dL   Protein, ur NEGATIVE NEGATIVE mg/dL   Nitrite NEGATIVE NEGATIVE   Leukocytes, UA NEGATIVE NEGATIVE   Lab Results  Component Value Date   HGBA1C 5.5 03/03/2017   MPG 111 03/03/2017   No results found for: PROLACTIN Lab Results  Component Value Date   CHOL 146 03/03/2017   TRIG 105 03/03/2017   HDL 46 03/03/2017   CHOLHDL 3.2 03/03/2017   VLDL 21 03/03/2017   LDLCALC 79 03/03/2017   LDLCALC 126 (H) 11/12/2016   No results found for: TSH  Therapeutic Level Labs: No results found for: LITHIUM No results found for: VALPROATE No components found for:  CBMZ  Current Medications: Current Outpatient Medications  Medication Sig Dispense Refill  . amLODipine (NORVASC) 10 MG tablet Take 1 tablet (10 mg total) by mouth daily. 30 tablet 2  . aspirin 325 MG tablet Take 325 mg by mouth daily.    Marland Kitchen atorvastatin (LIPITOR) 40 MG tablet Take 1 tablet (40 mg total) by mouth daily. 30 tablet 1  . Cyanocobalamin (VITAMIN B12 PO) Take 1  tablet by mouth daily.    . famotidine (PEPCID) 40 MG tablet Take 1 tablet (40 mg total) by mouth at bedtime. 30 tablet 1  . gabapentin (NEURONTIN) 400 MG capsule TAKE 1 CAPSULE BY MOUTH 3 TIMES DAILY (Patient taking differently: TAKE 1 CAPSULE (400 MG)  BY MOUTH EVERY MORNING, MAY ALSO TAKE 1 CAPSULE (400 MG) IN THE EVENING AS NEEDED FOR PAIN) 90 capsule 2  . hydrOXYzine (VISTARIL) 25 MG capsule Take one capsule dailya t bed time for insomnia 30 capsule 0  . lamoTRIgine (LAMICTAL) 25 MG tablet Take 1 tab daily for 1 week and than 2 tab daily 60 tablet 1  . oxyCODONE (OXY IR/ROXICODONE) 5 MG immediate release tablet Take 5 mg by mouth every 6 (six) hours as needed (pain).   0   Current Facility-Administered Medications  Medication Dose Route Frequency Provider Last Rate Last Dose  . 0.9 %  sodium chloride infusion  500 mL Intravenous Once Pyrtle, Lajuan Lines, MD         Musculoskeletal: Strength & Muscle Tone: spastic and decreased Gait & Station: unsteady Patient leans: Right  Psychiatric Specialty Exam: Review of Systems  Musculoskeletal: Positive for back pain.  Neurological: Positive for dizziness, tingling, sensory change  and focal weakness.  Psychiatric/Behavioral: Positive for depression. The patient is nervous/anxious and has insomnia.     Blood pressure 128/72, pulse 68, height '5\' 4"'$  (1.626 m), weight 130 lb (59 kg).There is no height or weight on file to calculate BMI.  General Appearance: Casual  Eye Contact:  Fair  Speech:  Clear and Coherent  Volume:  Normal  Mood:  Anxious and Depressed  Affect:  Constricted and Depressed  Thought Process:  Goal Directed  Orientation:  Full (Time, Place, and Person)  Thought Content: Rumination   Suicidal Thoughts:  No  Homicidal Thoughts:  No  Memory:  Immediate;   Good Recent;   Good Remote;   Good  Judgement:  Good  Insight:  Good  Psychomotor Activity:  Increased  Concentration:  Concentration: Fair and Attention Span: Fair   Recall:  Good  Fund of Knowledge: Good  Language: Good  Akathisia:  No  Handed:  Right  AIMS (if indicated): not done  Assets:  Communication Skills Desire for Improvement Housing Social Support  ADL's:  Impaired  Cognition: Impaired,  Mild  Sleep:  Fair   Screenings: PHQ2-9     Office Visit from 08/10/2017 in [facility] Office Visit from 06/10/2017 in [facility] Office Visit from 04/15/2017 in [facility] Office Visit from 03/15/2017 in [facility] Office Visit from 02/17/2017 in [facility]  PHQ-2 Total Score  '1  5  3  2  2  '$ PHQ-9 Total Score  No data  20  18  No data  No data       Assessment and Plan: Generalized anxiety disorder.  Major depressive disorder, recurrent.  I review collect information from other providers.  Her Neurontin dose is increased to 600 and she is now taking lisinopril for blood pressure.  She also taking oxycodone for back pain.  I will discontinue Lamictal as patient complaining of side effects.  I will increase Vistaril 50 mg at bedtime to help residual insomnia.  We will try Effexor 37.5 mg daily for 1 week and then 75 mg daily.  Explained cross titration with Celexa.  After 2 weeks she will stop the Celexa after reducing the dose.  Patient also have memory impairment.  We will recommend [facility] for cognitive impairment.  Patient is still in the process of getting appointment to see a therapist for coping skills.  Discussed medication side effects and benefits.  Encourage if she had side effects then she should call us immediately.  Discussed safety concerns at any time having active suicidal thoughts or homicidal thought and she need to call 911 or go to local emergency room.  Follow-up in 3-4 weeks.  Time spent 25 minutes.  More than 50% of the time spent in psychoeducation, counseling and coordination of care.   Kathlee Nations, MD 11/25/2017, 9:04  AM

## 2017-11-28 ENCOUNTER — Other Ambulatory Visit (HOSPITAL_COMMUNITY): Payer: Self-pay

## 2017-11-28 DIAGNOSIS — F411 Generalized anxiety disorder: Secondary | ICD-10-CM

## 2017-11-28 MED ORDER — VENLAFAXINE HCL ER 75 MG PO CP24: 75.0000 mg | ORAL_CAPSULE | Freq: Every day | ORAL | 0 refills | Status: DC

## 2017-11-28 MED ORDER — VENLAFAXINE HCL ER 37.5 MG PO CP24: ORAL_CAPSULE | ORAL | 0 refills | Status: DC

## 2017-11-30 ENCOUNTER — Emergency Department (HOSPITAL_COMMUNITY): Payer: Medicaid Other

## 2017-11-30 ENCOUNTER — Emergency Department (HOSPITAL_COMMUNITY)
Admission: EM | Admit: 2017-11-30 | Discharge: 2017-12-01 | Disposition: A | Discharge status: Home / Self Care | Payer: Medicaid Other | Attending: Emergency Medicine | Admitting: Emergency Medicine

## 2017-11-30 ENCOUNTER — Encounter (HOSPITAL_COMMUNITY): Payer: Self-pay | Admitting: Emergency Medicine

## 2017-11-30 DIAGNOSIS — R531 Weakness: Secondary | ICD-10-CM | POA: Diagnosis not present

## 2017-11-30 DIAGNOSIS — R4182 Altered mental status, unspecified: Secondary | ICD-10-CM | POA: Diagnosis present

## 2017-11-30 DIAGNOSIS — M549 Dorsalgia, unspecified: Secondary | ICD-10-CM | POA: Diagnosis not present

## 2017-11-30 DIAGNOSIS — J189 Pneumonia, unspecified organism: Secondary | ICD-10-CM | POA: Insufficient documentation

## 2017-11-30 DIAGNOSIS — Z79899 Other long term (current) drug therapy: Secondary | ICD-10-CM | POA: Insufficient documentation

## 2017-11-30 DIAGNOSIS — F17210 Nicotine dependence, cigarettes, uncomplicated: Secondary | ICD-10-CM | POA: Diagnosis not present

## 2017-11-30 DIAGNOSIS — R0602 Shortness of breath: Secondary | ICD-10-CM | POA: Diagnosis not present

## 2017-11-30 DIAGNOSIS — Z7982 Long term (current) use of aspirin: Secondary | ICD-10-CM | POA: Insufficient documentation

## 2017-11-30 DIAGNOSIS — I10 Essential (primary) hypertension: Secondary | ICD-10-CM | POA: Diagnosis not present

## 2017-11-30 DIAGNOSIS — R200 Anesthesia of skin: Secondary | ICD-10-CM | POA: Diagnosis not present

## 2017-11-30 LAB — I-STAT TROPONIN, ED: Troponin i, poc: 0 ng/mL (ref 0.00–0.08)

## 2017-11-30 LAB — I-STAT CHEM 8, ED
BUN: 53 mg/dL — AB (ref 6–20)
CALCIUM ION: 1.2 mmol/L (ref 1.15–1.40)
CHLORIDE: 102 mmol/L (ref 101–111)
CREATININE: 1.6 mg/dL — AB (ref 0.44–1.00)
GLUCOSE: 123 mg/dL — AB (ref 65–99)
HCT: 47 % — ABNORMAL HIGH (ref 36.0–46.0)
Hemoglobin: 16 g/dL — ABNORMAL HIGH (ref 12.0–15.0)
Potassium: 4.7 mmol/L (ref 3.5–5.1)
SODIUM: 135 mmol/L (ref 135–145)
TCO2: 23 mmol/L (ref 22–32)

## 2017-11-30 LAB — COMPREHENSIVE METABOLIC PANEL
ALK PHOS: 89 U/L (ref 38–126)
ALT: 9 U/L — AB (ref 14–54)
ANION GAP: 11 (ref 5–15)
AST: 19 U/L (ref 15–41)
Albumin: 3.9 g/dL (ref 3.5–5.0)
BILIRUBIN TOTAL: 0.8 mg/dL (ref 0.3–1.2)
BUN: 59 mg/dL — ABNORMAL HIGH (ref 6–20)
CALCIUM: 9.1 mg/dL (ref 8.9–10.3)
CO2: 20 mmol/L — AB (ref 22–32)
CREATININE: 1.68 mg/dL — AB (ref 0.44–1.00)
Chloride: 101 mmol/L (ref 101–111)
GFR calc Af Amer: 39 mL/min — ABNORMAL LOW (ref >60)
GFR calc non Af Amer: 33 mL/min — ABNORMAL LOW (ref >60)
Glucose, Bld: 125 mg/dL — ABNORMAL HIGH (ref 65–99)
Potassium: 4.8 mmol/L (ref 3.5–5.1)
SODIUM: 132 mmol/L — AB (ref 135–145)
TOTAL PROTEIN: 7.3 g/dL (ref 6.5–8.1)

## 2017-11-30 LAB — DIFFERENTIAL
Basophils Absolute: 0 10*3/uL (ref 0.0–0.1)
Basophils Relative: 0 %
EOS PCT: 1 %
Eosinophils Absolute: 0.2 10*3/uL (ref 0.0–0.7)
LYMPHS ABS: 2.4 10*3/uL (ref 0.7–4.0)
LYMPHS PCT: 11 %
MONO ABS: 0.8 10*3/uL (ref 0.1–1.0)
Monocytes Relative: 4 %
NEUTROS ABS: 18.8 10*3/uL — AB (ref 1.7–7.7)
Neutrophils Relative %: 84 %

## 2017-11-30 LAB — CBC
HCT: 45.8 % (ref 36.0–46.0)
HEMOGLOBIN: 14.9 g/dL (ref 12.0–15.0)
MCH: 29.8 pg (ref 26.0–34.0)
MCHC: 32.5 g/dL (ref 30.0–36.0)
MCV: 91.6 fL (ref 78.0–100.0)
PLATELETS: 295 10*3/uL (ref 150–400)
RBC: 5 MIL/uL (ref 3.87–5.11)
RDW: 14.9 % (ref 11.5–15.5)
WBC: 22.3 10*3/uL — AB (ref 4.0–10.5)

## 2017-11-30 LAB — I-STAT CG4 LACTIC ACID, ED: Lactic Acid, Venous: 0.44 mmol/L — ABNORMAL LOW (ref 0.5–1.9)

## 2017-11-30 LAB — I-STAT BETA HCG BLOOD, ED (MC, WL, AP ONLY): I-stat hCG, quantitative: <5 m[IU]/mL (ref <5)

## 2017-11-30 LAB — PROTIME-INR
INR: 0.88
Prothrombin Time: 11.9 seconds (ref 11.4–15.2)

## 2017-11-30 LAB — APTT: aPTT: 30 seconds (ref 24–36)

## 2017-11-30 LAB — CBG MONITORING, ED: GLUCOSE-CAPILLARY: 126 mg/dL — AB (ref 65–99)

## 2017-11-30 MED ORDER — CEFTRIAXONE SODIUM 1 G IJ SOLR
1.0000 g | Freq: Once | INTRAMUSCULAR | Status: AC
  Administered 2017-12-01: 1 g via INTRAVENOUS
  Filled 2017-11-30: qty 10

## 2017-11-30 MED ORDER — SODIUM CHLORIDE 0.9 % IV SOLN
500.0000 mg | Freq: Once | INTRAVENOUS | Status: AC
  Administered 2017-12-01: 500 mg via INTRAVENOUS
  Filled 2017-11-30: qty 500

## 2017-11-30 MED ORDER — LACTATED RINGERS IV BOLUS (SEPSIS)
1000.0000 mL | Freq: Once | INTRAVENOUS | Status: AC
  Administered 2017-11-30: 1000 mL via INTRAVENOUS

## 2017-11-30 MED ORDER — LACTATED RINGERS IV BOLUS (SEPSIS)
500.0000 mL | Freq: Once | INTRAVENOUS | Status: AC
  Administered 2017-12-01: 500 mL via INTRAVENOUS

## 2017-11-30 NOTE — ED Triage Notes (Signed)
Pt reports sudden onset of AMS, R arm tingling/numbness 8PM. Pt has hx of stroke but states this is different from her baseline. Pt also reports HA, blurred vision. Pt is A/O to self, when asking the year pt repeats her birthday, when asking when her prior stroke was she states "1hr ago". Pt also reports central CP, 10/10. Pt taken to CT, cleared by Dr. Sherry Ruffing

## 2017-11-30 NOTE — ED Notes (Signed)
Pt taken to c-t 2125 after code stroke was called for symptoms of dizziness not new  Rt arm numbness not new.  C/o chest pain   Headache and ear pain.  Scanned 2130  Neuro at the bedside  .  He cancelled the code stroke 2138

## 2017-11-30 NOTE — ED Notes (Signed)
Purwick placed

## 2017-11-30 NOTE — ED Notes (Signed)
pts sats are not staying up  Sleeping unless disturbed.  Nasal 02 at3 placed  The pt is a smoler

## 2017-11-30 NOTE — Consult Note (Addendum)
Full consult note pending  Stroke alert cancelled Presented with dizziness, confusion in addition to baseline R hemiparesis Has history of L MCA stroke. No tPA as symptoms mild in comparison for baseline  Suspect AMS due to toxic/metabolic reasons

## 2017-11-30 NOTE — ED Notes (Addendum)
The pt has back pain and has pain when she attempts to move her legs

## 2017-11-30 NOTE — ED Provider Notes (Signed)
[facility] EMERGENCY DEPARTMENT Provider Note   CSN: [id number] Arrival date & time: 11/30/17  2112     History   Chief Complaint Chief Complaint  Patient presents with  . Altered Mental Status    HPI Kelsey Wade is a 57 y.o. female.  The history is provided by the patient, the spouse and the EMS personnel.  Neurologic Problem  This is a recurrent problem. The current episode started 1 to 2 hours ago. The problem has not changed since onset.Associated symptoms include chest pain (chronic since stroke). Pertinent negatives include no abdominal pain, no headaches and no shortness of breath.  -Patient reportedly was at her baseline mental and neurological function when she went to sleep at 2 PM for a nap, however when she woke up around 8 PM she seemed to be repeating her speech, we count her right hand and possibly leaning to the right.  Patient seemed very drowsy.  Of note, her pain doctor did increase her dosing schedule for her chronic oxycodone.  She has states she has not been drinking enough water recently. -Due to patient's history of stroke and her neurological symptoms patient made a code stroke.   Past Medical History:  Diagnosis Date  . Anxiety   . Arthritis   . Chronic back pain   . Depression   . ETOH abuse   . GERD (gastroesophageal reflux disease)   . HTN (hypertension)   . Hyperlipidemia   . Stroke [facility])    June 2018  . Tobacco abuse     Patient Active Problem List   Diagnosis Date Noted  . Spastic hemiplegia affecting dominant side ([place]) 10/25/2017  . Left carotid artery occlusion   . CVA (cerebrovascular accident) ([place]) 03/02/2017  . Chronic pain 03/02/2017  . ETOH abuse 03/02/2017  . Ischemic stroke ([place])   . Hyperlipidemia LDL goal <100 11/15/2016  . Essential hypertension 11/12/2016  . Neuropathy 11/12/2016  . Tobacco dependence 11/12/2016    Past Surgical History:  Procedure Laterality Date  . APPENDECTOMY      OB  History    No data available       Home Medications    Prior to Admission medications   Medication Sig Start Date End Date Taking? Authorizing Provider  aspirin 325 MG tablet Take 325 mg by mouth daily.   Yes [provider]  atorvastatin (LIPITOR) 40 MG tablet Take 1 tablet (40 mg total) by mouth daily. 03/04/17  Yes Barton Dubois, MD  famotidine (PEPCID) 40 MG tablet Take 1 tablet (40 mg total) by mouth at bedtime. 10/03/17 10/03/18 Yes Dorena Dew, FNP  gabapentin (NEURONTIN) 600 MG tablet Take 600-1,200 mg by mouth 2 (two) times daily. 600 mg in the morning and 1200 mg in the evening 11/17/17  Yes [provider]  hydrOXYzine (VISTARIL) 50 MG capsule Take one capsule dailya t bed time for insomnia 11/25/17  Yes Arfeen, Arlyce Harman, MD  lisinopril (PRINIVIL,ZESTRIL) 10 MG tablet Take 10 mg by mouth daily. 11/21/17  Yes [provider]  oxyCODONE (OXY IR/ROXICODONE) 5 MG immediate release tablet Take 5 mg by mouth 5 (five) times daily.  08/16/17  Yes [provider]  venlafaxine XR (EFFEXOR XR) 37.5 MG 24 hr capsule Take one capsule daily for 1 week 11/28/17  Yes Arfeen, Arlyce Harman, MD  levofloxacin (LEVAQUIN) 500 MG tablet Take 1 tablet (500 mg total) by mouth daily. 12/01/17   Tobie Poet, DO  venlafaxine XR (EFFEXOR XR) 75 MG  24 hr capsule Take 1 capsule (75 mg total) by mouth daily. Patient not taking: Reported on 11/30/2017 11/28/17 11/28/18  Kathlee Nations, MD    Family History Family History  Problem Relation Age of Onset  . Alcohol abuse Father   . Alcohol abuse Brother   . Breast cancer Maternal Aunt   . Colon cancer Neg Hx   . Esophageal cancer Neg Hx   . Stomach cancer Neg Hx   . Rectal cancer Neg Hx     Social History Social History   Tobacco Use  . Smoking status: Current Every Day Smoker    Packs/day: 0.50    Types: Cigarettes  . Smokeless tobacco: Never Used  . Tobacco comment: less  Substance Use Topics  . Alcohol use: Yes     Comment: Occasionally   . Drug use: Yes    Types: Marijuana    Comment: quit 2 months ago 10-20-17     Allergies   Lamictal [lamotrigine]   Review of Systems Review of Systems  Constitutional: Positive for fatigue. Negative for chills and fever.  HENT: Negative for ear pain and sore throat.   Eyes: Negative for pain and visual disturbance.  Respiratory: Positive for cough. Negative for shortness of breath.   Cardiovascular: Positive for chest pain (chronic since stroke). Negative for palpitations and leg swelling.  Gastrointestinal: Negative for abdominal pain, diarrhea, nausea and vomiting.  Genitourinary: Negative for dysuria and hematuria.  Musculoskeletal: Positive for back pain (chronic since stroke). Negative for arthralgias.  Skin: Negative for color change and rash.  Neurological: Negative for seizures, syncope, light-headedness and headaches.  All other systems reviewed and are negative.    Physical Exam Updated Vital Signs BP 95/62   Pulse 78   Temp 98.6 F (37 C) (Oral)   Resp 14   Ht 5\' 4"  (1.626 m)   Wt 61.2 kg (135 lb)   SpO2 (!) 84%   BMI 23.17 kg/m   Physical Exam  Constitutional: She appears well-developed and well-nourished. She appears listless. No distress.  HENT:  Head: Normocephalic and atraumatic.  Right Ear: External ear normal.  Left Ear: External ear normal.  Mouth/Throat: Oropharynx is clear and moist.  Eyes: Conjunctivae and EOM are normal. Pupils are equal, round, and reactive to light.  Neck: Neck supple.  Cardiovascular: Normal rate and regular rhythm.  No murmur heard. Pulmonary/Chest: Effort normal. No respiratory distress. She has no wheezes. She has rales (mild rales bibasilar).  Abdominal: Soft. There is no tenderness.  Musculoskeletal: She exhibits no edema.  Neurological: She appears listless. No sensory deficit. GCS eye subscore is 4. GCS verbal subscore is 5. GCS motor subscore is 6.  4/5 RUE, 4/5 RLE (partially limited by  pain), 5/5 LUE/LLE.  Skin: Skin is warm and dry.  Psychiatric: She has a normal mood and affect.  Nursing note and vitals reviewed.    ED Treatments / Results  Labs (all labs ordered are listed, but only abnormal results are displayed) Labs Reviewed  CBC - Abnormal; Notable for the following components:      Result Value   WBC 22.3 (*)    All other components within normal limits  DIFFERENTIAL - Abnormal; Notable for the following components:   Neutro Abs 18.8 (*)    All other components within normal limits  COMPREHENSIVE METABOLIC PANEL - Abnormal; Notable for the following components:   Sodium 132 (*)    CO2 20 (*)    Glucose, Bld 125 (*)  BUN 59 (*)    Creatinine, Ser 1.68 (*)    ALT 9 (*)    GFR calc non Af Amer 33 (*)    GFR calc Af Amer 39 (*)    All other components within normal limits  CBG MONITORING, ED - Abnormal; Notable for the following components:   Glucose-Capillary 126 (*)    All other components within normal limits  I-STAT CHEM 8, ED - Abnormal; Notable for the following components:   BUN 53 (*)    Creatinine, Ser 1.60 (*)    Glucose, Bld 123 (*)    Hemoglobin 16.0 (*)    HCT 47.0 (*)    All other components within normal limits  I-STAT CG4 LACTIC ACID, ED - Abnormal; Notable for the following components:   Lactic Acid, Venous 0.44 (*)    All other components within normal limits  I-STAT CG4 LACTIC ACID, ED - Abnormal; Notable for the following components:   Lactic Acid, Venous 0.41 (*)    All other components within normal limits  CULTURE, BLOOD (ROUTINE X 2)  CULTURE, BLOOD (ROUTINE X 2)  PROTIME-INR  APTT  URINALYSIS, ROUTINE W REFLEX MICROSCOPIC  RAPID URINE DRUG SCREEN, HOSP PERFORMED  I-STAT TROPONIN, ED  CBG MONITORING, ED  I-STAT BETA HCG BLOOD, ED ([facility], [facility], [facility] ONLY)    EKG  EKG Interpretation  Date/Time:  Wednesday November 30 2017 21:16:20 EDT Ventricular Rate:  96 PR Interval:  120 QRS Duration: 80 QT Interval:  354 QTC  Calculation: 447 R Axis:   80 Text Interpretation:  Normal sinus rhythm Biatrial enlargement Abnormal ECG When comapred to prior, no significant changes seen.  No STEMI Confirmed by Antony Blackbird [phone]) on 11/30/2017 10:19:17 PM       Radiology Dg Chest 2 View  Result Date: 11/30/2017 CLINICAL DATA:  Acute onset of altered mental status. Central chest pain. EXAM: CHEST - 2 VIEW COMPARISON:  Chest radiograph performed 03/02/2017 FINDINGS: The lungs are well-aerated. Retrocardiac airspace opacity may reflect pneumonia, though mild interstitial edema might have a similar appearance. Mild vascular congestion is noted. There is no evidence of pleural effusion or pneumothorax. The heart is borderline enlarged. No acute osseous abnormalities are seen. IMPRESSION: 1. Retrocardiac airspace opacity may reflect pneumonia, though mild interstitial edema might have a similar appearance. 2. Mild vascular congestion and borderline cardiomegaly. Electronically Signed   By: Garald Balding M.D.   On: 11/30/2017 23:33   Ct Head Code Stroke Wo Contrast  Result Date: 11/30/2017 CLINICAL DATA:  Code stroke.  57 y/o  F; right-sided weakness. EXAM: CT HEAD WITHOUT CONTRAST TECHNIQUE: Contiguous axial images were obtained from the base of the skull through the vertex without intravenous contrast. COMPARISON:  03/04/2017 CT of the head and CTA of the head. 03/02/2017 MRI of the head. FINDINGS: Brain: Multiple chronic infarctions are present within the brain involving bilateral frontal and parietal lobes, left occipital lobe, and bilateral cerebellar hemispheres. Several of these infarcts are stable in comparison with the prior CT of the head. The infarction in right parietal lobe is new from the prior study but demonstrates volume loss indicating chronic etiology in the region of infarction in the left parietal lobe is increased in size when compared with the prior MRI but also demonstrates volume loss compatible with  encephalomalacia. No definite acute infarction. No hemorrhage or focal mass effect. Vascular: No hyperdense vessel or unexpected calcification. Skull: Normal. Negative for fracture or focal lesion. Sinuses/Orbits: No acute finding. Other: None. ASPECTS [facility]  Stroke Program Early CT Score) - Ganglionic level infarction (caudate, lentiform nuclei, internal capsule, insula, M1-M3 cortex): 7 - Supraganglionic infarction (M4-M6 cortex): 3 Total score (0-10 with 10 being normal): 10 IMPRESSION: 1. Progression of multiple chronic supratentorial infarctions with encephalomalacia. 2. No definite acute infarct identified. No hemorrhage or mass effect. 3. ASPECTS is 10 These results were called by telephone at the time of interpretation on 11/30/2017 at 9:51 pm to Dr. Lorraine Lax, who verbally acknowledged these results. Electronically Signed   By: Kristine Garbe M.D.   On: 11/30/2017 21:52    Procedures Procedures (including critical care time)  Medications Ordered in ED Medications  azithromycin (ZITHROMAX) 500 mg in sodium chloride 0.9 % 250 mL IVPB (not administered)  lactated ringers bolus 500 mL (500 mLs Intravenous New Bag/Given 12/01/17 0027)  lactated ringers bolus 1,000 mL (0 mLs Intravenous Stopped 11/30/17 2258)  cefTRIAXone (ROCEPHIN) 1 g in sodium chloride 0.9 % 100 mL IVPB (1 g Intravenous New Bag/Given 12/01/17 0031)     Initial Impression / Assessment and Plan / ED Course  I have reviewed the triage vital signs and the nursing notes.  Pertinent labs & imaging results that were available during my care of the patient were reviewed by me and considered in my medical decision making (see chart for details).    Patient is a 57 year old female with history of anxiety, chronic back pain, depression, stroke, hypertension, hyperlipidemia who presents with altered mental status and possible stroke.  Patient woke up at 8 after taking a 6-hour nap with right-sided weakness which is similar to her  prior stroke deficits.  She is also drowsy with repetitive speech.  She did get an increase in her pain medication dosing today.  Patient was made a code stroke.  The neurologist evaluated the patient and does not think this is an acute stroke but rather a metabolic or infectious etiology reactivating her prior stroke symptoms.  Based on the neurologist exam, he was not concerned about an acute stroke so the code stroke is discontinued.  Further workup sought after.    She is noted to have a leukocytosis of 22,000 but is afebrile here.  She does require supplemental oxygen to keep her O2 sat above 90.  She has mild crackles on exam.  Chest x-ray shows possible pneumonia.  Rocephin and azithromycin started.  Negative lactic acid.  Blood cultures obtained.  CT code stroke protocol did not show any acute findings.  During the course of being in the ED she received 1.5 L of fluids.  Her altered mentation significantly improved while in the ED.  She did have a mild AK I and given she is taking opioid pain medication likely accumulated in her system causing her to be drowsy.  Along with the infectious etiology combined, contributing to her mental status change and reactivation of her prior stroke symptoms.  Plan to admit the patient for IV antibiotics given she had reactivation of her stroke symptoms from multiple etiologies including a KI, opioids, infection.  Patient adamantly wants to go home and wants to sign out [facility].  Patient's mentation on reevaluation showed that she was competent and able to make this decision.  She was able to repeat back to me why we were admitting her and also the risk of leaving [place].  Patient okay with the risks and understands that she needs to come back if any of her symptoms worsen. Pt to f/u with PCP in 1d. She also  states that she has access to oxygen concentrator at home.    Final Clinical Impressions(s) / ED Diagnoses   Final  diagnoses:  Community acquired pneumonia, unspecified laterality    ED Discharge Orders        Ordered    levofloxacin (LEVAQUIN) 500 MG tablet  Daily     12/01/17 0053       Tobie Poet, DO 12/01/17 0112    Tegeler, Gwenyth Allegra, MD 12/02/17 [phone]

## 2017-11-30 NOTE — ED Notes (Signed)
Pt very sleepy unable to keeps her eyes open  She keeps talking about oxycodone that she takes  She reports that she has arthritis

## 2017-11-30 NOTE — ED Notes (Signed)
Patient transported to X-ray 

## 2017-12-01 LAB — RAPID URINE DRUG SCREEN, HOSP PERFORMED
AMPHETAMINES: NOT DETECTED
BARBITURATES: NOT DETECTED
Benzodiazepines: POSITIVE — AB
Cocaine: NOT DETECTED
Opiates: POSITIVE — AB
Tetrahydrocannabinol: NOT DETECTED

## 2017-12-01 LAB — URINALYSIS, ROUTINE W REFLEX MICROSCOPIC
BILIRUBIN URINE: NEGATIVE
GLUCOSE, UA: NEGATIVE mg/dL
HGB URINE DIPSTICK: NEGATIVE
Ketones, ur: NEGATIVE mg/dL
Leukocytes, UA: NEGATIVE
Nitrite: NEGATIVE
PH: 5 (ref 5.0–8.0)
Protein, ur: NEGATIVE mg/dL
SPECIFIC GRAVITY, URINE: 1.014 (ref 1.005–1.030)

## 2017-12-01 LAB — I-STAT CG4 LACTIC ACID, ED: LACTIC ACID, VENOUS: 0.41 mmol/L — AB (ref 0.5–1.9)

## 2017-12-01 MED ORDER — LEVOFLOXACIN 500 MG PO TABS: 500.0000 mg | ORAL_TABLET | Freq: Every day | ORAL | 0 refills | Status: DC

## 2017-12-01 NOTE — ED Notes (Signed)
More awake with with 02

## 2017-12-01 NOTE — ED Notes (Signed)
Very alert now answering questions appropriately

## 2017-12-01 NOTE — ED Notes (Signed)
P[t back on 02 sats 88 withoout the 02  She is still determined to go home

## 2017-12-05 ENCOUNTER — Telehealth: Payer: Self-pay | Admitting: *Deleted

## 2017-12-05 NOTE — Telephone Encounter (Signed)
Pharmacy called related to Rx: levaquin and pt on celexa .Marland KitchenMarland KitchenEDCM clarified with EDP ([place]) to change Rx to: Augmentin 875 mg tablets BID x 7 days.

## 2017-12-06 LAB — CULTURE, BLOOD (ROUTINE X 2)
CULTURE: NO GROWTH
CULTURE: NO GROWTH
SPECIAL REQUESTS: ADEQUATE
SPECIAL REQUESTS: ADEQUATE

## 2017-12-07 NOTE — Therapy (Signed)
[facility] [address] [place], [place], [address] Phone: [phone]   Fax:  [phone]  Patient Details  Name: Kelsey Wade MRN: [id number] Date of Birth: [birth year] Referring Provider:  Dr. Letta Pate Encounter Date: 12/07/2017  OCCUPATIONAL THERAPY DISCHARGE SUMMARY  Visits from Start of Care: 6  Current functional level related to goals / functional outcomes: OT Short Term Goals - 10/17/17 1154      OT SHORT TERM GOAL #1   Title  Independent with HEP for RUE - ALL LTG's to be due by end of January 2019    Baseline  Dependent    Time  4    Period  Weeks    Status  Achieved      OT SHORT TERM GOAL #2   Title  Independent with splint wear and care RT hand    Baseline  dependent     Time  4    Period  Weeks    Status  Achieved for pre-fab splint. May need new splint if pt receives botox      OT SHORT TERM GOAL #3   Title  Pt to be mod I dressing UE and bathing UB with hemi-techniques and A/E PRN    Baseline  MIN ASSIST    Time  4    Period  Weeks    Status  Achieved      OT SHORT TERM GOAL #4   Title  Pt to verbalized understanding with A/E to increase independence with BADLS (including: LH sponge, rocker knife, shoe buttons)    Baseline  Dependent     Time  4    Period  Weeks    Status  Achieved      Pt met no LTG's secondary to not returning after 10/17/17.    Remaining deficits: unknown   Education / Equipment: Splint wear and care, HEP, A/E recommendations  Plan: Patient agrees to discharge.  Patient goals were partially met. Patient is being discharged due to not returning since the last visit.  ?????       Carey Bullocks, OTR/L 12/07/2017, 10:52 AM  [facility] [address] [place], [place], [address] Phone: [phone]   Fax:  [phone]

## 2017-12-22 ENCOUNTER — Encounter: Payer: Medicaid Other | Attending: Physical Medicine & Rehabilitation

## 2017-12-22 ENCOUNTER — Ambulatory Visit (INDEPENDENT_AMBULATORY_CARE_PROVIDER_SITE_OTHER): Payer: Medicaid Other | Admitting: Psychiatry

## 2017-12-22 ENCOUNTER — Encounter (HOSPITAL_COMMUNITY): Payer: Self-pay | Admitting: Psychiatry

## 2017-12-22 ENCOUNTER — Ambulatory Visit: Payer: Self-pay | Admitting: Physical Medicine & Rehabilitation

## 2017-12-22 VITALS — BP 117/87 | HR 105 | Ht 64.0 in | Wt 125.0 lb

## 2017-12-22 DIAGNOSIS — Z811 Family history of alcohol abuse and dependence: Secondary | ICD-10-CM | POA: Diagnosis not present

## 2017-12-22 DIAGNOSIS — M542 Cervicalgia: Secondary | ICD-10-CM

## 2017-12-22 DIAGNOSIS — G8929 Other chronic pain: Secondary | ICD-10-CM | POA: Insufficient documentation

## 2017-12-22 DIAGNOSIS — F332 Major depressive disorder, recurrent severe without psychotic features: Secondary | ICD-10-CM

## 2017-12-22 DIAGNOSIS — F17210 Nicotine dependence, cigarettes, uncomplicated: Secondary | ICD-10-CM

## 2017-12-22 DIAGNOSIS — R252 Cramp and spasm: Secondary | ICD-10-CM | POA: Insufficient documentation

## 2017-12-22 DIAGNOSIS — M79641 Pain in right hand: Secondary | ICD-10-CM | POA: Insufficient documentation

## 2017-12-22 DIAGNOSIS — M2550 Pain in unspecified joint: Secondary | ICD-10-CM

## 2017-12-22 DIAGNOSIS — F411 Generalized anxiety disorder: Secondary | ICD-10-CM | POA: Diagnosis not present

## 2017-12-22 DIAGNOSIS — I69354 Hemiplegia and hemiparesis following cerebral infarction affecting left non-dominant side: Secondary | ICD-10-CM | POA: Insufficient documentation

## 2017-12-22 DIAGNOSIS — M549 Dorsalgia, unspecified: Secondary | ICD-10-CM | POA: Diagnosis not present

## 2017-12-22 MED ORDER — HYDROXYZINE PAMOATE 50 MG PO CAPS: ORAL_CAPSULE | ORAL | 1 refills | Status: DC

## 2017-12-22 MED ORDER — VENLAFAXINE HCL ER 150 MG PO CP24: 150.0000 mg | ORAL_CAPSULE | Freq: Every day | ORAL | 1 refills | Status: DC

## 2017-12-22 NOTE — Progress Notes (Signed)
[facility] MD/PA/NP OP Progress Note  12/22/2017 2:18 PM Kelsey Wade  MRN:  [id number]  Chief Complaint: I like new medication but I do not think dose is not strong.  I still feels sad depressed and anxious.  I hurt a lot.  HPI: Kelsey Wade came for her follow-up appointment with her husband.  She was referred from [facility].  Patient has depression, anxiety lately she is having crying spells, irritability, frustration and feeling hopelessness and worthlessness.  We tried Lamictal but she has confusion and dizziness and it was stopped.  On her last visit we started Effexor.  She is taking 75 mg daily.  She seen some improvement in her mood and she is less anxious and less depressed but she still feels sad, isolated, withdrawn and burden to her family.  Patient has multiple health issues including tingling, neuropathy, memory impairment.  She had tried Cymbalta, BuSpar, trazodone, Wellbutrin, Celexa and lately Lamictal.  She is taking hydroxyzine with some time helps her sleep.  Though she denies any suicidal thoughts or homicidal thought but endorsed chronic symptoms of depression and anxiety.  Her biggest problem is chronic pain.  She is getting pain medication from [facility].  Recently she visited emergency room because she felt that she has stroke.  However she found to be pneumonia and she was given antibiotic.  In the emergency room she had blood test and U tox which was positive for benzos and opiates.  However patient denied using benzodiazepine.  Patient denies any paranoia, aggression, suicidal thoughts or homicidal thought.  Her energy level is fair.  Visit Diagnosis:    ICD-10-CM   1. GAD (generalized anxiety disorder) F41.1 venlafaxine XR (EFFEXOR-XR) 150 MG 24 hr capsule    hydrOXYzine (VISTARIL) 50 MG capsule    Past Psychiatric History: Reviewed. Patient denies any history of psychiatric inpatient treatment or any suicidal time.  She had a history of irritability,  anger, mood swings, depression symptoms started to get worse after the stroke in 2018.  She had tried Cymbalta, Zoloft, Celexa, trazodone, amitriptyline, BuSpar, Wellbutrin with limited effect. We tried Lamictal but she had side effects including dizziness, hurting all over the body and confusion.   Past Medical History:  Past Medical History:  Diagnosis Date  . Anxiety   . Arthritis   . Chronic back pain   . Depression   . ETOH abuse   . GERD (gastroesophageal reflux disease)   . HTN (hypertension)   . Hyperlipidemia   . Stroke [facility])    June 2018  . Tobacco abuse     Past Surgical History:  Procedure Laterality Date  . APPENDECTOMY      Family Psychiatric History: Reviewed  Family History:  Family History  Problem Relation Age of Onset  . Alcohol abuse Father   . Alcohol abuse Brother   . Breast cancer Maternal Aunt   . Colon cancer Neg Hx   . Esophageal cancer Neg Hx   . Stomach cancer Neg Hx   . Rectal cancer Neg Hx     Social History:  Social History   Socioeconomic History  . Marital status: Divorced    Spouse name: Not on file  . Number of children: 2  . Years of education: Not on file  . Highest education level: 9th grade  Occupational History  . Occupation: disabled  Social Needs  . Financial resource strain: Not on file  . Food insecurity:    Worry: Not on file  Inability: Not on file  . Transportation needs:    Medical: Not on file    Non-medical: Not on file  Tobacco Use  . Smoking status: Current Every Day Smoker    Packs/day: 0.50    Types: Cigarettes  . Smokeless tobacco: Never Used  . Tobacco comment: less  Substance and Sexual Activity  . Alcohol use: Yes    Comment: Occasionally   . Drug use: Yes    Types: Marijuana    Comment: quit 2 months ago 10-20-17  . Sexual activity: Not on file  Lifestyle  . Physical activity:    Days per week: Not on file    Minutes per session: Not on file  . Stress: Not on file  Relationships  .  Social connections:    Talks on phone: Not on file    Gets together: Not on file    Attends religious service: Not on file    Active member of club or organization: Not on file    Attends meetings of clubs or organizations: Not on file    Relationship status: Not on file  Other Topics Concern  . Not on file  Social History Narrative   Divorced, lives in a 1 story home, with 2 steps to enter. Drinks 2 cups of coffee, and one Mtn. Dew a day. Prior to becoming disabled by CVA, patient was employed in house keeping.     Allergies:  Allergies  Allergen Reactions  . Lamictal [Lamotrigine] Other (See Comments)    Causing dizziness, hurting all over the body and confusion.    Metabolic Disorder Labs: Recent Results (from the past 2160 hour(s))  Comprehensive metabolic panel     Status: Abnormal   Collection Time: 10/01/17  6:55 PM  Result Value Ref Range   Sodium 139 135 - 145 mmol/L   Potassium 3.4 (L) 3.5 - 5.1 mmol/L   Chloride 106 101 - 111 mmol/L   CO2 23 22 - 32 mmol/L   Glucose, Bld 134 (H) 65 - 99 mg/dL   BUN 22 (H) 6 - 20 mg/dL   Creatinine, Ser 0.71 0.44 - 1.00 mg/dL   Calcium 9.0 8.9 - 10.3 mg/dL   Total Protein 6.8 6.5 - 8.1 g/dL   Albumin 4.0 3.5 - 5.0 g/dL   AST 108 (H) 15 - 41 U/L   ALT 43 14 - 54 U/L   Alkaline Phosphatase 66 38 - 126 U/L   Total Bilirubin 0.8 0.3 - 1.2 mg/dL   GFR calc non Af Amer >60 >60 mL/min   GFR calc Af Amer >60 >60 mL/min    Comment: (NOTE) The eGFR has been calculated using the CKD EPI equation. This calculation has not been validated in all clinical situations. eGFR's persistently <60 mL/min signify possible Chronic Kidney Disease.    Anion gap 10 5 - 15  CBC     Status: None   Collection Time: 10/01/17  6:55 PM  Result Value Ref Range   WBC 8.5 4.0 - 10.5 K/uL   RBC 4.95 3.87 - 5.11 MIL/uL   Hemoglobin 14.8 12.0 - 15.0 g/dL   HCT 45.6 36.0 - 46.0 %   MCV 92.1 78.0 - 100.0 fL   MCH 29.9 26.0 - 34.0 pg   MCHC 32.5 30.0 - 36.0  g/dL   RDW 14.5 11.5 - 15.5 %   Platelets 246 150 - 400 K/uL  I-Stat beta hCG blood, ED     Status: None   Collection Time: 10/01/17  7:02 PM  Result Value Ref Range   I-stat hCG, quantitative <5.0 <5 mIU/mL   Comment 3            Comment:   GEST. AGE      CONC.  (mIU/mL)   <=1 WEEK        5 - 50     2 WEEKS       50 - 500     3 WEEKS       100 - 10,000     4 WEEKS     1,000 - 30,000        FEMALE AND NON-PREGNANT FEMALE:     LESS THAN 5 mIU/mL   Urinalysis, Routine w reflex microscopic     Status: None   Collection Time: 10/01/17  7:46 PM  Result Value Ref Range   Color, Urine YELLOW YELLOW   APPearance CLEAR CLEAR   Specific Gravity, Urine 1.025 1.005 - 1.030   pH 5.0 5.0 - 8.0   Glucose, UA NEGATIVE NEGATIVE mg/dL   Hgb urine dipstick NEGATIVE NEGATIVE   Bilirubin Urine NEGATIVE NEGATIVE   Ketones, ur NEGATIVE NEGATIVE mg/dL   Protein, ur NEGATIVE NEGATIVE mg/dL   Nitrite NEGATIVE NEGATIVE   Leukocytes, UA NEGATIVE NEGATIVE  CBG monitoring, ED     Status: Abnormal   Collection Time: 11/30/17  9:21 PM  Result Value Ref Range   Glucose-Capillary 126 (H) 65 - 99 mg/dL  Protime-INR     Status: None   Collection Time: 11/30/17  9:30 PM  Result Value Ref Range   Prothrombin Time 11.9 11.4 - 15.2 seconds   INR 0.88     Comment: Performed at [facility], [place] [address] [place], [place] [address]  APTT     Status: None   Collection Time: 11/30/17  9:30 PM  Result Value Ref Range   aPTT 30 24 - 36 seconds    Comment: Performed at [facility] [address], [place], [place] [address]  CBC     Status: Abnormal   Collection Time: 11/30/17  9:30 PM  Result Value Ref Range   WBC 22.3 (H) 4.0 - 10.5 K/uL   RBC 5.00 3.87 - 5.11 MIL/uL   Hemoglobin 14.9 12.0 - 15.0 g/dL   HCT 45.8 36.0 - 46.0 %   MCV 91.6 78.0 - 100.0 fL   MCH 29.8 26.0 - 34.0 pg   MCHC 32.5 30.0 - 36.0 g/dL   RDW 14.9 11.5 - 15.5 %   Platelets 295 150 - 400 K/uL    Comment: Performed at  [facility], [place] [address] [place], [place] [address]  Differential     Status: Abnormal   Collection Time: 11/30/17  9:30 PM  Result Value Ref Range   Neutrophils Relative % 84 %   Neutro Abs 18.8 (H) 1.7 - 7.7 K/uL   Lymphocytes Relative 11 %   Lymphs Abs 2.4 0.7 - 4.0 K/uL   Monocytes Relative 4 %   Monocytes Absolute 0.8 0.1 - 1.0 K/uL   Eosinophils Relative 1 %   Eosinophils Absolute 0.2 0.0 - 0.7 K/uL   Basophils Relative 0 %   Basophils Absolute 0.0 0.0 - 0.1 K/uL    Comment: Performed at [facility] [address] [place], [place] [address]  Comprehensive metabolic panel     Status: Abnormal   Collection Time: 11/30/17  9:30 PM  Result Value Ref Range   Sodium  132 (L) 135 - 145 mmol/L   Potassium 4.8 3.5 - 5.1 mmol/L   Chloride 101 101 - 111 mmol/L   CO2 20 (L) 22 - 32 mmol/L   Glucose, Bld 125 (H) 65 - 99 mg/dL   BUN 59 (H) 6 - 20 mg/dL   Creatinine, Ser 1.68 (H) 0.44 - 1.00 mg/dL   Calcium 9.1 8.9 - 10.3 mg/dL   Total Protein 7.3 6.5 - 8.1 g/dL   Albumin 3.9 3.5 - 5.0 g/dL   AST 19 15 - 41 U/L   ALT 9 (L) 14 - 54 U/L   Alkaline Phosphatase 89 38 - 126 U/L   Total Bilirubin 0.8 0.3 - 1.2 mg/dL   GFR calc non Af Amer 33 (L) >60 mL/min   GFR calc Af Amer 39 (L) >60 mL/min    Comment: (NOTE) The eGFR has been calculated using the CKD EPI equation. This calculation has not been validated in all clinical situations. eGFR's persistently <60 mL/min signify possible Chronic Kidney Disease.    Anion gap 11 5 - 15    Comment: Performed at [facility] [address] [place], [place] [address]  I-stat troponin, ED     Status: None   Collection Time: 11/30/17  9:35 PM  Result Value Ref Range   Troponin i, poc 0.00 0.00 - 0.08 ng/mL   Comment 3            Comment: Due to the release kinetics of cTnI, a negative result within the first hours of the onset of symptoms does not rule out myocardial infarction with certainty. If myocardial infarction  is still suspected, repeat the test at appropriate intervals.   I-Stat beta hCG blood, ED     Status: None   Collection Time: 11/30/17  9:35 PM  Result Value Ref Range   I-stat hCG, quantitative <5.0 <5 mIU/mL   Comment 3            Comment:   GEST. AGE      CONC.  (mIU/mL)   <=1 WEEK        5 - 50     2 WEEKS       50 - 500     3 WEEKS       100 - 10,000     4 WEEKS     1,000 - 30,000        FEMALE AND NON-PREGNANT FEMALE:     LESS THAN 5 mIU/mL   I-Stat Chem 8, ED     Status: Abnormal   Collection Time: 11/30/17  9:37 PM  Result Value Ref Range   Sodium 135 135 - 145 mmol/L   Potassium 4.7 3.5 - 5.1 mmol/L   Chloride 102 101 - 111 mmol/L   BUN 53 (H) 6 - 20 mg/dL   Creatinine, Ser 1.60 (H) 0.44 - 1.00 mg/dL   Glucose, Bld 123 (H) 65 - 99 mg/dL   Calcium, Ion 1.20 1.15 - 1.40 mmol/L   TCO2 23 22 - 32 mmol/L   Hemoglobin 16.0 (H) 12.0 - 15.0 g/dL   HCT 47.0 (H) 36.0 - 46.0 %  I-Stat CG4 Lactic Acid, ED     Status: Abnormal   Collection Time: 11/30/17 11:37 PM  Result Value Ref Range   Lactic Acid, Venous 0.44 (L) 0.5 - 1.9 mmol/L  Blood culture (routine x 2)     Status: None   Collection Time: 12/01/17 12:15 AM  Result Value Ref Range  Specimen Description BLOOD RIGHT HAND    Special Requests      BOTTLES DRAWN AEROBIC AND ANAEROBIC Blood Culture adequate volume   Culture      NO GROWTH 5 DAYS Performed at [facility], [address], [place], [place] [address]    Report Status 12/06/2017 FINAL   Blood culture (routine x 2)     Status: None   Collection Time: 12/01/17 12:20 AM  Result Value Ref Range   Specimen Description BLOOD LEFT ARM    Special Requests      BOTTLES DRAWN AEROBIC AND ANAEROBIC Blood Culture adequate volume   Culture      NO GROWTH 5 DAYS Performed at [facility], [place] [address] [place], [place] [address]    Report Status 12/06/2017 FINAL   I-Stat CG4 Lactic Acid, ED     Status: Abnormal   Collection Time: 12/01/17 12:26 AM   Result Value Ref Range   Lactic Acid, Venous 0.41 (L) 0.5 - 1.9 mmol/L  Urinalysis, Routine w reflex microscopic     Status: None   Collection Time: 12/01/17  3:15 AM  Result Value Ref Range   Color, Urine YELLOW YELLOW   APPearance CLEAR CLEAR   Specific Gravity, Urine 1.014 1.005 - 1.030   pH 5.0 5.0 - 8.0   Glucose, UA NEGATIVE NEGATIVE mg/dL   Hgb urine dipstick NEGATIVE NEGATIVE   Bilirubin Urine NEGATIVE NEGATIVE   Ketones, ur NEGATIVE NEGATIVE mg/dL   Protein, ur NEGATIVE NEGATIVE mg/dL   Nitrite NEGATIVE NEGATIVE   Leukocytes, UA NEGATIVE NEGATIVE    Comment: Performed at [facility], [place] [address] [place], [place] [address]  Rapid urine drug screen (hospital performed)     Status: Abnormal   Collection Time: 12/01/17  3:15 AM  Result Value Ref Range   Opiates POSITIVE (A) NONE DETECTED   Cocaine NONE DETECTED NONE DETECTED   Benzodiazepines POSITIVE (A) NONE DETECTED   Amphetamines NONE DETECTED NONE DETECTED   Tetrahydrocannabinol NONE DETECTED NONE DETECTED   Barbiturates NONE DETECTED NONE DETECTED    Comment: (NOTE) DRUG SCREEN FOR MEDICAL PURPOSES ONLY.  IF CONFIRMATION IS NEEDED FOR ANY PURPOSE, NOTIFY LAB WITHIN 5 DAYS. LOWEST DETECTABLE LIMITS FOR URINE DRUG SCREEN Drug Class                     Cutoff (ng/mL) Amphetamine and metabolites    1000 Barbiturate and metabolites    200 Benzodiazepine                 709 Tricyclics and metabolites     300 Opiates and metabolites        300 Cocaine and metabolites        300 THC                            50 Performed at [facility], [place] [address] [place], [place] [address]    Lab Results  Component Value Date   HGBA1C 5.5 03/03/2017   MPG 111 03/03/2017   No results found for: PROLACTIN Lab Results  Component Value Date   CHOL 146 03/03/2017   TRIG 105 03/03/2017   HDL 46 03/03/2017   CHOLHDL 3.2 03/03/2017   VLDL 21 03/03/2017   LDLCALC 79 03/03/2017   LDLCALC 126 (H)  11/12/2016   No results found for: TSH  Therapeutic Level Labs: No results found  for: LITHIUM No results found for: VALPROATE No components found for:  CBMZ  Current Medications: Current Outpatient Medications  Medication Sig Dispense Refill  . aspirin 325 MG tablet Take 325 mg by mouth daily.    Marland Kitchen atorvastatin (LIPITOR) 40 MG tablet Take 1 tablet (40 mg total) by mouth daily. 30 tablet 1  . famotidine (PEPCID) 40 MG tablet Take 1 tablet (40 mg total) by mouth at bedtime. 30 tablet 1  . gabapentin (NEURONTIN) 600 MG tablet Take 600-1,200 mg by mouth 2 (two) times daily. 600 mg in the morning and 1200 mg in the evening  0  . hydrOXYzine (VISTARIL) 50 MG capsule Take one capsule dailya t bed time for insomnia 30 capsule 0  . levofloxacin (LEVAQUIN) 500 MG tablet Take 1 tablet (500 mg total) by mouth daily. 5 tablet 0  . lisinopril (PRINIVIL,ZESTRIL) 10 MG tablet Take 10 mg by mouth daily.  0  . oxyCODONE (OXY IR/ROXICODONE) 5 MG immediate release tablet Take 5 mg by mouth 5 (five) times daily.   0  . venlafaxine XR (EFFEXOR XR) 37.5 MG 24 hr capsule Take one capsule daily for 1 week 7 capsule 0  . venlafaxine XR (EFFEXOR XR) 75 MG 24 hr capsule Take 1 capsule (75 mg total) by mouth daily. (Patient not taking: Reported on 11/30/2017) 90 capsule 0   Current Facility-Administered Medications  Medication Dose Route Frequency Provider Last Rate Last Dose  . 0.9 %  sodium chloride infusion  500 mL Intravenous Once Pyrtle, Lajuan Lines, MD         Musculoskeletal: Strength & Muscle Tone: spastic and decreased Gait & Station: unsteady Patient leans: Right  Psychiatric Specialty Exam: Review of Systems  Constitutional: Positive for malaise/fatigue.  Musculoskeletal: Positive for back pain, joint pain and neck pain.  Neurological: Positive for tingling and focal weakness.  Psychiatric/Behavioral: Positive for depression.    Blood pressure 117/87, pulse (!) 105, height '5\' 4"'$  (1.626 m), weight 125  lb (56.7 kg), SpO2 100 %.There is no height or weight on file to calculate BMI.  General Appearance: Casual  Eye Contact:  Fair  Speech:  Slow  Volume:  Decreased  Mood:  Dysphoric  Affect:  Constricted  Thought Process:  Goal Directed  Orientation:  Full (Time, Place, and Person)  Thought Content: Rumination   Suicidal Thoughts:  No  Homicidal Thoughts:  No  Memory:  Immediate;   Fair Recent;   Good Remote;   Good  Judgement:  Good  Insight:  Good  Psychomotor Activity:  Decreased  Concentration:  Concentration: Fair and Attention Span: Fair  Recall:  Good  Fund of Knowledge: Good  Language: Good  Akathisia:  No  Handed:  Right  AIMS (if indicated): not done  Assets:  Communication Skills Desire for Improvement Housing Resilience Social Support  ADL's:  Impaired  Cognition: Impaired,  Mild  Sleep:  Fair   Screenings: PHQ2-9     Office Visit from 08/10/2017 in [facility] Office Visit from 06/10/2017 in [facility] Office Visit from 04/15/2017 in [facility] Office Visit from 03/15/2017 in [facility] Office Visit from 02/17/2017 in [facility]  PHQ-2 Total Score  '1  5  3  2  2  '$ PHQ-9 Total Score  -  20  18  -  -       Assessment and Plan: Generalized anxiety disorder.  Major depressive disorder, recurrent.  Patient doing mild improvement with Effexor.  She does not feel the dose is not strong.  Recommended to Effexor 150 mg daily.  Continue Vistaril 50 mg 1-2 capsule as needed for insomnia.  Patient is scheduled to see neurology for memory impairment.  She is not interested in counseling.  She is taking pain medication and I suggested she should discuss with her pain specialist if low-dose Valium may help her anxiety.  Recommended to call us back if she has any question or any concern.  I also reviewed blood work results and collect information from the recent emergency room  visit.  Follow-up in 2 months. Time spent 25 minutes.  More than 50% of the time spent in psychoeducation, counseling and coordination of care.  Discuss safety plan that anytime having active suicidal thoughts or homicidal thoughts then patient need to call 911 or go to the local emergency room.     Kathlee Nations, MD 12/22/2017, 2:18 PM

## 2017-12-23 ENCOUNTER — Ambulatory Visit: Payer: Self-pay | Admitting: Physical Medicine & Rehabilitation

## 2017-12-23 ENCOUNTER — Ambulatory Visit: Payer: Self-pay

## 2018-01-01 ENCOUNTER — Emergency Department (HOSPITAL_COMMUNITY): Payer: Medicaid Other

## 2018-01-01 ENCOUNTER — Encounter (HOSPITAL_COMMUNITY): Payer: Self-pay | Admitting: Emergency Medicine

## 2018-01-01 ENCOUNTER — Other Ambulatory Visit: Payer: Self-pay

## 2018-01-01 ENCOUNTER — Inpatient Hospital Stay (HOSPITAL_COMMUNITY)
Admission: EM | Admit: 2018-01-01 | Discharge: 2018-01-06 | DRG: 682 | Disposition: A | Discharge status: Home Health | Payer: Medicaid Other | Attending: Family Medicine | Admitting: Family Medicine

## 2018-01-01 DIAGNOSIS — F17210 Nicotine dependence, cigarettes, uncomplicated: Secondary | ICD-10-CM | POA: Diagnosis present

## 2018-01-01 DIAGNOSIS — E872 Acidosis: Secondary | ICD-10-CM | POA: Diagnosis present

## 2018-01-01 DIAGNOSIS — Z7982 Long term (current) use of aspirin: Secondary | ICD-10-CM | POA: Diagnosis not present

## 2018-01-01 DIAGNOSIS — Z79891 Long term (current) use of opiate analgesic: Secondary | ICD-10-CM | POA: Diagnosis not present

## 2018-01-01 DIAGNOSIS — R0902 Hypoxemia: Secondary | ICD-10-CM | POA: Diagnosis present

## 2018-01-01 DIAGNOSIS — K219 Gastro-esophageal reflux disease without esophagitis: Secondary | ICD-10-CM | POA: Diagnosis present

## 2018-01-01 DIAGNOSIS — Z79899 Other long term (current) drug therapy: Secondary | ICD-10-CM

## 2018-01-01 DIAGNOSIS — F1010 Alcohol abuse, uncomplicated: Secondary | ICD-10-CM | POA: Diagnosis present

## 2018-01-01 DIAGNOSIS — E860 Dehydration: Secondary | ICD-10-CM | POA: Diagnosis present

## 2018-01-01 DIAGNOSIS — R4701 Aphasia: Secondary | ICD-10-CM | POA: Diagnosis present

## 2018-01-01 DIAGNOSIS — I69351 Hemiplegia and hemiparesis following cerebral infarction affecting right dominant side: Secondary | ICD-10-CM | POA: Diagnosis not present

## 2018-01-01 DIAGNOSIS — F329 Major depressive disorder, single episode, unspecified: Secondary | ICD-10-CM | POA: Diagnosis present

## 2018-01-01 DIAGNOSIS — G9340 Encephalopathy, unspecified: Secondary | ICD-10-CM | POA: Diagnosis present

## 2018-01-01 DIAGNOSIS — R410 Disorientation, unspecified: Secondary | ICD-10-CM | POA: Diagnosis not present

## 2018-01-01 DIAGNOSIS — I959 Hypotension, unspecified: Secondary | ICD-10-CM | POA: Diagnosis present

## 2018-01-01 DIAGNOSIS — I10 Essential (primary) hypertension: Secondary | ICD-10-CM | POA: Diagnosis present

## 2018-01-01 DIAGNOSIS — Z716 Tobacco abuse counseling: Secondary | ICD-10-CM | POA: Diagnosis not present

## 2018-01-01 DIAGNOSIS — F10230 Alcohol dependence with withdrawal, uncomplicated: Secondary | ICD-10-CM | POA: Diagnosis not present

## 2018-01-01 DIAGNOSIS — N179 Acute kidney failure, unspecified: Secondary | ICD-10-CM | POA: Diagnosis present

## 2018-01-01 DIAGNOSIS — G894 Chronic pain syndrome: Secondary | ICD-10-CM | POA: Diagnosis present

## 2018-01-01 DIAGNOSIS — F419 Anxiety disorder, unspecified: Secondary | ICD-10-CM | POA: Diagnosis present

## 2018-01-01 DIAGNOSIS — F10939 Alcohol use, unspecified with withdrawal, unspecified: Secondary | ICD-10-CM

## 2018-01-01 DIAGNOSIS — E785 Hyperlipidemia, unspecified: Secondary | ICD-10-CM | POA: Diagnosis present

## 2018-01-01 DIAGNOSIS — Z888 Allergy status to other drugs, medicaments and biological substances status: Secondary | ICD-10-CM

## 2018-01-01 DIAGNOSIS — G92 Toxic encephalopathy: Secondary | ICD-10-CM | POA: Diagnosis present

## 2018-01-01 DIAGNOSIS — G936 Cerebral edema: Secondary | ICD-10-CM

## 2018-01-01 DIAGNOSIS — F10239 Alcohol dependence with withdrawal, unspecified: Secondary | ICD-10-CM | POA: Diagnosis present

## 2018-01-01 DIAGNOSIS — Z973 Presence of spectacles and contact lenses: Secondary | ICD-10-CM

## 2018-01-01 DIAGNOSIS — G928 Other toxic encephalopathy: Secondary | ICD-10-CM

## 2018-01-01 DIAGNOSIS — E875 Hyperkalemia: Secondary | ICD-10-CM | POA: Diagnosis present

## 2018-01-01 LAB — BASIC METABOLIC PANEL
Anion gap: 9 (ref 5–15)
BUN: 58 mg/dL — ABNORMAL HIGH (ref 6–20)
CO2: 19 mmol/L — AB (ref 22–32)
CREATININE: 5.28 mg/dL — AB (ref 0.44–1.00)
Calcium: 8.3 mg/dL — ABNORMAL LOW (ref 8.9–10.3)
Chloride: 111 mmol/L (ref 101–111)
GFR calc non Af Amer: 8 mL/min — ABNORMAL LOW (ref >60)
GFR, EST AFRICAN AMERICAN: 10 mL/min — AB (ref >60)
Glucose, Bld: 294 mg/dL — ABNORMAL HIGH (ref 65–99)
Potassium: 5.4 mmol/L — ABNORMAL HIGH (ref 3.5–5.1)
SODIUM: 139 mmol/L (ref 135–145)

## 2018-01-01 LAB — I-STAT VENOUS BLOOD GAS, ED
ACID-BASE DEFICIT: 10 mmol/L — AB (ref 0.0–2.0)
Bicarbonate: 19.1 mmol/L — ABNORMAL LOW (ref 20.0–28.0)
O2 Saturation: 85 %
PH VEN: 7.134 — AB (ref 7.250–7.430)
TCO2: 21 mmol/L — ABNORMAL LOW (ref 22–32)
pCO2, Ven: 56.8 mmHg (ref 44.0–60.0)
pO2, Ven: 66 mmHg — ABNORMAL HIGH (ref 32.0–45.0)

## 2018-01-01 LAB — CBC WITH DIFFERENTIAL/PLATELET
Basophils Absolute: 0 10*3/uL (ref 0.0–0.1)
Basophils Relative: 0 %
EOS PCT: 3 %
Eosinophils Absolute: 0.3 10*3/uL (ref 0.0–0.7)
HEMATOCRIT: 44.9 % (ref 36.0–46.0)
Hemoglobin: 14.5 g/dL (ref 12.0–15.0)
LYMPHS ABS: 2.9 10*3/uL (ref 0.7–4.0)
LYMPHS PCT: 32 %
MCH: 29.2 pg (ref 26.0–34.0)
MCHC: 32.3 g/dL (ref 30.0–36.0)
MCV: 90.5 fL (ref 78.0–100.0)
Monocytes Absolute: 0.7 10*3/uL (ref 0.1–1.0)
Monocytes Relative: 8 %
NEUTROS ABS: 5.2 10*3/uL (ref 1.7–7.7)
Neutrophils Relative %: 57 %
PLATELETS: 268 10*3/uL (ref 150–400)
RBC: 4.96 MIL/uL (ref 3.87–5.11)
RDW: 14.6 % (ref 11.5–15.5)
WBC: 9 10*3/uL (ref 4.0–10.5)

## 2018-01-01 LAB — CBG MONITORING, ED
GLUCOSE-CAPILLARY: 82 mg/dL (ref 65–99)
GLUCOSE-CAPILLARY: 151 mg/dL — AB (ref 65–99)
Glucose-Capillary: 40 mg/dL — CL (ref 65–99)

## 2018-01-01 LAB — COMPREHENSIVE METABOLIC PANEL
ALBUMIN: 3.7 g/dL (ref 3.5–5.0)
ALT: 7 U/L — ABNORMAL LOW (ref 14–54)
ANION GAP: 11 (ref 5–15)
AST: 10 U/L — AB (ref 15–41)
Alkaline Phosphatase: 91 U/L (ref 38–126)
BUN: 63 mg/dL — ABNORMAL HIGH (ref 6–20)
CHLORIDE: 110 mmol/L (ref 101–111)
CO2: 19 mmol/L — ABNORMAL LOW (ref 22–32)
Calcium: 8.9 mg/dL (ref 8.9–10.3)
Creatinine, Ser: 5.73 mg/dL — ABNORMAL HIGH (ref 0.44–1.00)
GFR calc Af Amer: 9 mL/min — ABNORMAL LOW (ref >60)
GFR calc non Af Amer: 8 mL/min — ABNORMAL LOW (ref >60)
GLUCOSE: 85 mg/dL (ref 65–99)
POTASSIUM: 6.9 mmol/L — AB (ref 3.5–5.1)
SODIUM: 140 mmol/L (ref 135–145)
Total Bilirubin: 0.4 mg/dL (ref 0.3–1.2)
Total Protein: 7.3 g/dL (ref 6.5–8.1)

## 2018-01-01 LAB — AMMONIA: Ammonia: 32 umol/L (ref 9–35)

## 2018-01-01 LAB — I-STAT TROPONIN, ED: Troponin i, poc: 0.02 ng/mL (ref 0.00–0.08)

## 2018-01-01 LAB — ETHANOL

## 2018-01-01 LAB — BRAIN NATRIURETIC PEPTIDE: B Natriuretic Peptide: 97.4 pg/mL (ref 0.0–100.0)

## 2018-01-01 LAB — I-STAT BETA HCG BLOOD, ED (MC, WL, AP ONLY): I-stat hCG, quantitative: <5 m[IU]/mL (ref <5)

## 2018-01-01 LAB — I-STAT CG4 LACTIC ACID, ED
LACTIC ACID, VENOUS: 0.7 mmol/L (ref 0.5–1.9)
Lactic Acid, Venous: 0.6 mmol/L (ref 0.5–1.9)

## 2018-01-01 MED ORDER — STERILE WATER FOR INJECTION IV SOLN: INTRAVENOUS | Status: DC

## 2018-01-01 MED ORDER — ALBUTEROL SULFATE (2.5 MG/3ML) 0.083% IN NEBU
10.0000 mg | INHALATION_SOLUTION | Freq: Once | RESPIRATORY_TRACT | Status: AC
  Administered 2018-01-01: 10 mg via RESPIRATORY_TRACT
  Filled 2018-01-01: qty 12

## 2018-01-01 MED ORDER — SODIUM CHLORIDE 0.9 % IV BOLUS
1000.0000 mL | Freq: Once | INTRAVENOUS | Status: AC
  Administered 2018-01-01: 1000 mL via INTRAVENOUS

## 2018-01-01 MED ORDER — ATORVASTATIN CALCIUM 40 MG PO TABS
40.0000 mg | ORAL_TABLET | Freq: Every day | ORAL | Status: DC
  Administered 2018-01-03 – 2018-01-06 (×4): 40 mg via ORAL
  Filled 2018-01-01 (×5): qty 1

## 2018-01-01 MED ORDER — SODIUM CHLORIDE 0.9 % IV SOLN: Freq: Once | INTRAVENOUS | Status: DC

## 2018-01-01 MED ORDER — ASPIRIN 325 MG PO TABS
325.0000 mg | ORAL_TABLET | Freq: Every day | ORAL | Status: DC
  Administered 2018-01-02: 325 mg via ORAL
  Filled 2018-01-01: qty 1

## 2018-01-01 MED ORDER — HEPARIN SODIUM (PORCINE) 5000 UNIT/ML IJ SOLN
5000.0000 [IU] | Freq: Three times a day (TID) | INTRAMUSCULAR | Status: DC
  Administered 2018-01-02 – 2018-01-06 (×12): 5000 [IU] via SUBCUTANEOUS
  Filled 2018-01-01 (×12): qty 1

## 2018-01-01 MED ORDER — INSULIN ASPART 100 UNIT/ML IV SOLN: 10.0000 [IU] | Freq: Once | INTRAVENOUS | Status: DC

## 2018-01-01 MED ORDER — THIAMINE HCL 100 MG/ML IJ SOLN: 100.0000 mg | Freq: Every day | INTRAMUSCULAR | Status: DC

## 2018-01-01 MED ORDER — ADULT MULTIVITAMIN W/MINERALS CH
1.0000 | ORAL_TABLET | Freq: Every day | ORAL | Status: DC
  Administered 2018-01-02 – 2018-01-06 (×5): 1 via ORAL
  Filled 2018-01-01 (×5): qty 1

## 2018-01-01 MED ORDER — FOLIC ACID 1 MG PO TABS
1.0000 mg | ORAL_TABLET | Freq: Every day | ORAL | Status: DC
  Administered 2018-01-02 – 2018-01-06 (×5): 1 mg via ORAL
  Filled 2018-01-01 (×5): qty 1

## 2018-01-01 MED ORDER — INSULIN ASPART 100 UNIT/ML ~~LOC~~ SOLN
10.0000 [IU] | Freq: Once | SUBCUTANEOUS | Status: AC
  Administered 2018-01-01: 10 [IU] via INTRAVENOUS
  Filled 2018-01-01: qty 1

## 2018-01-01 MED ORDER — VITAMIN B-1 100 MG PO TABS
100.0000 mg | ORAL_TABLET | Freq: Every day | ORAL | Status: DC
  Administered 2018-01-02 – 2018-01-06 (×5): 100 mg via ORAL
  Filled 2018-01-01 (×5): qty 1

## 2018-01-01 MED ORDER — CALCIUM GLUCONATE 10 % IV SOLN
1.0000 g | Freq: Once | INTRAVENOUS | Status: AC
  Administered 2018-01-01: 1 g via INTRAVENOUS
  Filled 2018-01-01: qty 10

## 2018-01-01 MED ORDER — SODIUM CHLORIDE 0.9 % IV BOLUS: 1000.0000 mL | Freq: Once | INTRAVENOUS | Status: DC

## 2018-01-01 MED ORDER — SODIUM CHLORIDE 0.9% FLUSH
3.0000 mL | Freq: Two times a day (BID) | INTRAVENOUS | Status: DC
  Administered 2018-01-02: 3 mL via INTRAVENOUS

## 2018-01-01 MED ORDER — DEXTROSE 10 % IV SOLN
Freq: Once | INTRAVENOUS | Status: AC
  Administered 2018-01-01: 20:00:00 via INTRAVENOUS

## 2018-01-01 MED ORDER — DEXTROSE 10 % IV BOLUS
250.0000 mL | Freq: Once | INTRAVENOUS | Status: AC
  Administered 2018-01-01: 250 mL via INTRAVENOUS

## 2018-01-01 MED ORDER — STERILE WATER FOR INJECTION IV SOLN
INTRAVENOUS | Status: DC
  Administered 2018-01-01 – 2018-01-03 (×4): via INTRAVENOUS
  Filled 2018-01-01 (×7): qty 850

## 2018-01-01 NOTE — ED Notes (Signed)
Patient transported to CT 

## 2018-01-01 NOTE — ED Notes (Signed)
Pt's CBG result was 151. Informed Sharrie Rothman - RN.

## 2018-01-01 NOTE — ED Triage Notes (Signed)
Patient arrived to ED via [facility] from [facility]. EMS reports:  UC reported patient being seen at facility for pain management. Patient had reported difficulty with speech x 2 days.  Patient has had previous CVA with R sided deficits. Reported L sided weakness which was not noted.  NSR on monitor. BP 126/70, Pulse 84, 96% on RA. CBG 89.

## 2018-01-01 NOTE — H&P (Signed)
History and Physical   Kelsey Wade QAS:[id number] DOB: 1961-01-18 DOA: 01/01/2018  PCP: Elenore Paddy, FNP  Chief Complaint: confusion  Note history is limited due to patient's altered mental status, history obtained via talking to the husband and review of the EMR.  HPI: this a 57 year old woman with medical problems including ongoing smoking, alcohol abuse,  CVA history with residual right-sided weakness, chronic pain on opioid therapy, depression/anxiety who presents to the emergency room with confusion.  The patient at baseline lives with her self-reported husband, "[name]" in [place]. She formally worked in [employer], she retired due to her chronic pain for which she sees a pain management physician, pain located in her back, shoulders, hips. She takes oxycodone 10 mg 5 times daily as well as gabapentin 1800 mg in the morning daily. The husband reports over the past 3 days the patient has had progressive confusion as well as sleepiness. Her memory has been impaired and has noted some tremulousness, shaking of her extremities. Weakness has worsened. Reports she mostly drinks beer, cites at least 3 beers daily. Minimal water intake. Denies any nausea vomiting or diarrhea. At baseline, she requires ADL assistance with dressing.  There is report of a bowel movement on the day of admission. She was evaluated in the emergency department in March 2019 where she was diagnosed with pneumonia and an AKI.  Also reports she completed an outpatient course of antibiotics. Also reports she manages her medicines.  ED Course: in the emergency department vital signs remarkable for increase respiratory rate 24, systolic blood pressure of 83 improved to 101 mmHg.  Normal heart rate. Diagnostics remarkable for potassium 6.9 without hemolysis, creatinine 5.7, albumin 63. Lactic acid normal. CBC unremarkable. CT scan of the head without contrast did not reveal acute findings,  he did show evidence of prior infarcts. CXR with hazy bibasilar airspace disease, non-specific, mild cardiomegaly with vascular congestion and mild diffuse interstitial opacities concerning for mild interstitial edema.  She was treated with albuterol nebulizer, rapid acting IV insulin, calcium gluconate and 1 L normal saline. Hospital medicine consulted further management.  Review of Systems: A complete ROS was not able to be obtained due to the patient's altered mental status.  Past Medical History:  Diagnosis Date  . Anxiety   . Arthritis   . Chronic back pain   . Depression   . ETOH abuse   . GERD (gastroesophageal reflux disease)   . HTN (hypertension)   . Hyperlipidemia   . Stroke [facility])    June 2018  . Tobacco abuse    Social History   Socioeconomic History  . Marital status: Divorced    Spouse name: Not on file  . Number of children: 2  . Years of education: Not on file  . Highest education level: 9th grade  Occupational History  . Occupation: disabled  Social Needs  . Financial resource strain: Not on file  . Food insecurity:    Worry: Not on file    Inability: Not on file  . Transportation needs:    Medical: Not on file    Non-medical: Not on file  Tobacco Use  . Smoking status: Current Every Day Smoker    Packs/day: 0.50    Types: Cigarettes  . Smokeless tobacco: Never Used  . Tobacco comment: less  Substance and Sexual Activity  . Alcohol use: Yes    Comment: Occasionally   . Drug use: Yes    Types: Marijuana    Comment:  quit 2 months ago 10-20-17  . Sexual activity: Not on file  Lifestyle  . Physical activity:    Days per week: Not on file    Minutes per session: Not on file  . Stress: Not on file  Relationships  . Social connections:    Talks on phone: Not on file    Gets together: Not on file    Attends religious service: Not on file    Active member of club or organization: Not on file    Attends meetings of clubs or organizations: Not on file      Relationship status: Not on file  . Intimate partner violence:    Fear of current or ex partner: Not on file    Emotionally abused: Not on file    Physically abused: Not on file    Forced sexual activity: Not on file  Other Topics Concern  . Not on file  Social History Narrative   Divorced, lives in a 1 story home, with 2 steps to enter. Drinks 2 cups of coffee, and one Mtn. Dew a day. Prior to becoming disabled by CVA, patient was employed in house keeping.    Family History  Problem Relation Age of Onset  . Alcohol abuse Father   . Alcohol abuse Brother   . Breast cancer Maternal Aunt   . Colon cancer Neg Hx   . Esophageal cancer Neg Hx   . Stomach cancer Neg Hx   . Rectal cancer Neg Hx     Physical Exam: Vitals:   01/01/18 1915 01/01/18 2000 01/01/18 2015 01/01/18 2030  BP: 101/68 107/65 112/63 102/64  Pulse: 80 91 91 93  Resp: (!) 24 15 (!) 9 11  Temp:      TempSrc:      SpO2: 98% 97% 98% 96%  Weight:      Height:       General: Thin white woman, appears drowsy and confused, poorly groomed ENT: Grossly normal hearing, does not open mouth upon command, no epistaxis Cardiovascular: RRR. No M/R/G. No LE edema.  Respiratory: Examined during continuous albuterol nebulization; on supplemental O2, globally decreased breath sounds anteriorly, no wheezing present Abdomen: Soft, non-tender. No rebound or guarding Skin: No rash or induration seen on limited exam. Dry skin. Musculoskeletal: No frank deformities of extremities x 4, globally reduced muscle mass Psychiatric: flat affect Neurologic: awakens to verbal stimuli, makes attempts to answer questions, oriented to self, not current year, will open eyes for 5-10 seconds before closing again, follows commands intermittently and for short period of time.    I have personally reviewed the following labs, culture data, and imaging studies.  Assessment/Plan:  #Acute kidney injury  #Hyperkalemia Course: baseline Cr was < 1  in 09/2017; when seen on 11/30/2017 for her PNA Cr note to be 1.7.  On this admission, Cr of 5.7, K of 6.9 without hemolysis. Treated with hyperkalemia temporizing agents in the emergency department. Assessment: uncertain cause of her AKI, historical details limited 2/2 to AMS, possible that dehydration has led to intrinsic kidney injury; likely ACEi contributed to development of hyperkalemia Plan: Emergency medicine team has consulted nephrology team for assistance in optimizing management of AKI and hyperkalemia. Will continue telemetry, close monitoring of blood sugar ordered q4h in the setting of IV insulin use to avoid hypoglycemia.  Foley catheter placement to closely follow urine output.  Further diagnostics to evaluate AKI etiology: renal US, urinalysis with microscopy, urine electrolytes (cr, na).  Hydration with 150 meq  bicarb + H20 at 125 cc q h. Checking BMP q 6 hours.  Will consider furosemide if hemodynamic stability persists and hyperkalemia persists at next BMP. Follow up nephrology team recommendations further.  #Other problems -Acute encephalopathy: suspect multifactorial with AKI and CNS depressants, particularly the gabapentin build up in setting of AKI.  Plan: hold CNS depressants, treating supportively. -Hx of CVA: report of residual R sided weakness, continue statin and ASA -HTN: holding ACEi given hypotension and hyperkalemia -Current smoker: cessation efforts while inpatient -ETOH abuse: at least 3 beers daily, CIWA protocol ordered for monitoring, provision of thiamine -Depression / anxiety: holding home venlafaxine given AKI -Chronic pain: holding home opioids given current encephalopathy  DVT prophylaxis: Subq Heparin Code Status: Full code  Disposition Plan: Anticipate D/C when medically stable Consults called: nephrology consulted by emergency medicine team Admission status: admit to hospital medicine service, step-down unit   Cheri Rous, MD [facility] Page:[phone]  If 7PM-7AM, please contact night-coverage [email or web address] Password [email or web address]

## 2018-01-01 NOTE — ED Provider Notes (Addendum)
[name] EMERGENCY DEPARTMENT Provider Note   CSN: [id number] Arrival date & time: 01/01/18  1732     History   Chief Complaint Chief Complaint  Patient presents with  . Altered Mental Status    HPI Kelsey Wade is a 57 y.o. female.   According to EMS, they were called out to urgent care as patient was altered.  Patient has a history of a stroke with right-sided deficits at baseline.  Unknown last known normal according to EMS.  Husband was at urgent care and could not elaborate any history.  Patient possibly endorses multiple days of not being able to talk very well.  Patient denies any pain.  Difficult to examine given apparent aphasia.   The history is provided by the patient and the EMS personnel.   Neurologic Problem   The current episode started 2 days ago. The problem occurs constantly. The problem has not changed since onset.Pertinent negatives include no chest pain, no abdominal pain, no headaches and no shortness of breath.  Nothing aggravates the symptoms.  Nothing relieves the symptoms. She has tried nothing for the symptoms. The treatment provided no relief.    Past Medical History:  Diagnosis Date  . Anxiety   . Arthritis   . Chronic back pain   . Depression   . ETOH abuse   . GERD (gastroesophageal reflux disease)   . HTN (hypertension)   . Hyperlipidemia   . Stroke [facility])    June 2018  . Tobacco abuse     Patient Active Problem List   Diagnosis Date Noted  . 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 Left carotid artery occlusion   . CVA (cerebrovascular accident) ([place]) 03/02/2017  . Chronic pain 03/02/2017  . ETOH abuse 03/02/2017  . Ischemic stroke ([place])   . Hyperlipidemia LDL goal <100 11/15/2016  . Essential hypertension 11/12/2016  . Neuropathy 11/12/2016  . Tobacco dependence 11/12/2016    Past Surgical History:  Procedure Laterality Date  . APPENDECTOMY       OB History   None       Home Medications    Prior to Admission medications   Medication Sig Start Date End Date Taking? Authorizing Provider  albuterol (PROAIR HFA) 108 (90 Base) MCG/ACT inhaler Inhale 2 puffs into the lungs every 6 (six) hours as needed for wheezing or shortness of breath.   Yes [provider]  aspirin 325 MG tablet Take 325 mg by mouth daily.   Yes [provider]  atorvastatin (LIPITOR) 40 MG tablet Take 1 tablet (40 mg total) by mouth daily. 03/04/17  Yes Barton Dubois, MD  gabapentin (NEURONTIN) 600 MG tablet Take 1,800 mg by mouth daily.  11/17/17  Yes [provider]  hydrOXYzine (VISTARIL) 50 MG capsule Take one to two capsule daily t bed time for insomnia Patient taking differently: Take 50-100 mg by mouth at bedtime. For insomnia 12/22/17  Yes Arfeen, Arlyce Harman, MD  lisinopril (PRINIVIL,ZESTRIL) 10 MG tablet Take 10 mg by mouth daily. 11/21/17  Yes [provider]  Oxycodone HCl 10 MG TABS Take 10 mg by mouth 5 (five) times daily.  12/30/17  Yes [provider]  venlafaxine XR (EFFEXOR-XR) 150 MG 24 hr capsule Take 1 capsule (150 mg total) by mouth daily with breakfast. 12/22/17 12/22/18 Yes Arfeen, Arlyce Harman, MD  famotidine (PEPCID) 40 MG tablet Take 1 tablet (40 mg total) by mouth at bedtime. Patient not taking: Reported on 01/01/2018 10/03/17 10/03/18  Dorena Dew, FNP    Family History Family History  Problem Relation Age of Onset  . Alcohol abuse Father   . Alcohol abuse Brother   . Breast cancer Maternal Aunt   . Colon cancer Neg Hx   . Esophageal cancer Neg Hx   . Stomach cancer Neg Hx   . Rectal cancer Neg Hx     Social History Social History   Tobacco Use  . Smoking status: Current Every Day Smoker    Packs/day: 0.50    Types: Cigarettes  . Smokeless tobacco: Never Used  . Tobacco comment: less  Substance Use Topics  . Alcohol use: Yes    Comment: Occasionally   . Drug use: Yes    Types: Marijuana    Comment: quit 2  months ago 10-20-17     Allergies   Lamictal [lamotrigine]   Review of Systems Review of Systems  Constitutional: Negative for chills and fever.  HENT: Negative for ear pain and sore throat.   Eyes: Negative for pain and visual disturbance.  Respiratory: Negative for cough and shortness of breath.   Cardiovascular: Negative for chest pain and palpitations.  Gastrointestinal: Negative for abdominal pain and vomiting.  Genitourinary: Negative for dysuria and hematuria.  Musculoskeletal: Negative for arthralgias and back pain.  Skin: Negative for color change and rash.  Neurological: Positive for speech difficulty. Negative for dizziness, seizures, syncope, facial asymmetry, light-headedness and headaches.  All other systems reviewed and are negative.    Physical Exam Updated Vital Signs  ED Triage Vitals  Enc Vitals Group     BP 01/01/18 1755 (!) 83/61     Pulse Rate 01/01/18 1807 75     Resp 01/01/18 1755 19     Temp 01/01/18 1755 98 F (36.7 C)     Temp Source 01/01/18 1755 Oral     SpO2 01/01/18 1755 94 %     Weight 01/01/18 1757 130 lb (59 kg)     Height 01/01/18 1757 5\' 7"  (1.702 m)     Head Circumference --      Peak Flow --      Pain Score 01/01/18 1757 10     Pain Loc --      Pain Edu? --      Excl. in [place]? --     Physical Exam  Constitutional: She appears well-developed and well-nourished. No distress.  HENT:  Head: Normocephalic and atraumatic.  Eyes: Pupils are equal, round, and reactive to light. Conjunctivae and EOM are normal.  Neck: Normal range of motion. Neck supple.  Cardiovascular: Normal rate and regular rhythm.  No murmur heard. Pulmonary/Chest: Effort normal and breath sounds normal. No respiratory distress.  Abdominal: Soft. There is no tenderness.  Musculoskeletal: She exhibits no edema.  Spastic in RUE  Neurological: She is alert. No cranial nerve deficit.  Patient with spasticity to the right upper extremity but appears to have 5+ out  of 5 strength bilateral upper extremities, weak effort on left lower extremities strength exam, right lower extremity with 5+/5 out of strength, patient able to safely ambulate, no obvious drift, patient with apparent word finding, word salad, aphasia, very confused upon questioning  Skin: Skin is warm and dry.  Psychiatric: She has a normal mood and affect.  Nursing note and vitals reviewed.    ED Treatments / Results  Labs (all labs ordered are listed, but only abnormal results are displayed) Labs Reviewed  COMPREHENSIVE METABOLIC PANEL - Abnormal; Notable for the following  components:      Result Value   Potassium 6.9 (*)    CO2 19 (*)    BUN 63 (*)    Creatinine, Ser 5.73 (*)    AST 10 (*)    ALT 7 (*)    GFR calc non Af Amer 8 (*)    GFR calc Af Amer 9 (*)    All other components within normal limits  URINE CULTURE  CULTURE, BLOOD (ROUTINE X 2)  CULTURE, BLOOD (ROUTINE X 2)  CBC WITH DIFFERENTIAL/PLATELET  AMMONIA  ETHANOL  URINALYSIS, COMPLETE (UACMP) WITH MICROSCOPIC  CBC  RAPID URINE DRUG SCREEN, HOSP PERFORMED  BRAIN NATRIURETIC PEPTIDE  BLOOD GAS, VENOUS  CBG MONITORING, ED  CBG MONITORING, ED  I-STAT BETA HCG BLOOD, ED ([facility], [facility], [facility] ONLY)  I-STAT CG4 LACTIC ACID, ED  I-STAT TROPONIN, ED  I-STAT CG4 LACTIC ACID, ED    EKG None  Radiology Ct Head Wo Contrast  Result Date: 01/01/2018 CLINICAL DATA:  Altered level of consciousness, LEFT side weakness, history of stroke with RIGHT-sided deficits, hypertension, smoker EXAM: CT HEAD WITHOUT CONTRAST TECHNIQUE: Contiguous axial images were obtained from the base of the skull through the vertex without intravenous contrast. COMPARISON:  11/30/2017 FINDINGS: Brain: Normal ventricular morphology. No midline shift or mass effect. Old infarcts in LEFT frontal and parietal lobes extending to the LEFT occipital lobe. Small old RIGHT frontal and parietal infarcts. Probable tiny old infarct at RIGHT cerebellum. No intracranial  hemorrhage, mass lesion or evidence of acute infarction. No extra-axial fluid collections. Vascular: Atherosclerotic calcifications at the internal carotid arteries bilaterally at the skull base Skull: Unremarkable Sinuses/Orbits: Clear Other: N/A IMPRESSION: Old BILATERAL infarcts. No acute intracranial abnormalities. Electronically Signed   By: Lavonia Dana M.D.   On: 01/01/2018 19:05   Dg Chest Portable 1 View  Result Date: 01/01/2018 CLINICAL DATA:  Altered EXAM: PORTABLE CHEST 1 VIEW COMPARISON:  11/30/2017, 10/01/2017, 03/02/2017 FINDINGS: Mild cardiomegaly with vascular congestion. Mild diffuse interstitial opacities suggesting minimal edema. Mild bibasilar airspace disease. No pneumothorax. IMPRESSION: 1. Hazy bibasilar airspace disease, atelectasis versus pneumonia 2. Mild cardiomegaly with vascular congestion and mild diffuse interstitial opacities suspect for mild interstitial edema Electronically Signed   By: Donavan Foil M.D.   On: 01/01/2018 18:59    Procedures Procedures (including critical care time)  Medications Ordered in ED Medications  0.9 %  sodium chloride infusion (has no administration in time range)  sodium chloride 0.9 % bolus 1,000 mL (has no administration in time range)  sodium bicarbonate 150 mEq in sterile water 1,000 mL infusion (has no administration in time range)  sodium chloride 0.9 % bolus 1,000 mL (0 mLs Intravenous Stopped 01/01/18 1936)  calcium gluconate inj 10% (1 g) URGENT USE ONLY! (1 g Intravenous Given 01/01/18 1925)  albuterol (PROVENTIL) (2.5 MG/3ML) 0.083% nebulizer solution 10 mg (10 mg Nebulization Given 01/01/18 2000)  dextrose 10 % infusion ( Intravenous New Bag/Given 01/01/18 2000)  insulin aspart (novoLOG) injection 10 Units (10 Units Intravenous Given 01/01/18 1925)     EMERGENCY DEPARTMENT [place] CARDIAC EXAM "Study: Limited Ultrasound of the Heart and Pericardium"  INDICATIONS:Abnormal vital signs Multiple views of the heart and pericardium  were obtained in real-time with a multi-frequency probe.  PERFORMED VV:OHYWVP IMAGES ARCHIVED?: Yes LIMITATIONS:  Body habitus VIEWS USED: Subcostal 4 chamber INTERPRETATION: Cardiac activity present and RV appears flat and concern for decreased volume, no effusion    Initial Impression / Assessment and Plan / ED Course  I have  reviewed the triage vital signs and the nursing notes.  Pertinent labs & imaging results that were available during my care of the patient were reviewed by me and considered in my medical decision making (see chart for details).     Kelsey Wade is a 57 year old female with history of alcohol abuse, chronic back pain, high cholesterol, anxiety, stroke with right-sided deficits who presents to the ED with confusion.  Patient with low blood pressure upon arrival with systolics in the high 12W but no tachycardia, no fever.  Patient mentating well but has some bizarre thought process.  She appears neurologically intact and no gross signs to suggest stroke at this time.  Patient difficult to obtain history and physical examination from due to bizarre behavior.  EMS states that the family was at urgent care with the patient but was unable to provide any history as well.  According to EMS patient was at urgent care for patient follow-up and was found to be acting differently and confused.  There is no known last normal.  EMS states urgent care provider thought patient has been abnormal for several days.  Patient has multiple psychiatric illnesses as well.  Patient mentating well and moves all extremities.  Will obtain broad workup with infectious workup with blood cultures, urine studies, chest x-ray, EKG, troponin, blood gas, CT head, we will also obtain ammonia, UDS, alcohol level.  Patient with likely multifactorial mental status change.  Possible electrolyte abnormality, intracranial abnormality, polypharmacy, infectious.  Patient with chest x-ray that shows some signs of  volume overload.  Patient intermittently hypoxic but unreliable O2 reader and was placed on 2 L of oxygen. Denies COPD hx.  Bedside echocardiogram showed no effusion and overall impression from echocardiogram is pt appears volume down.  Patient was given a liter of fluid with improvement of blood pressure.  Patient had EKG that showed sinus rhythm with no signs of ischemic changes.  Patient did have a creatinine of 5.7 and potassium was 6.9 and EKG did show peaked T waves. Bicarb 19.  Patient was given IV calcium, albuterol, insulin, dextrose for potassium correction.  Patient otherwise had troponin within normal limits.  Patient is without fever, normal lactic acid, normal white count and doubt infectious process at this time.  Patient had a head CT that was unremarkable as well.  Suspect that patient with altered mental status possibly from psychiatric undertones but also from hypotension from volume depletion causing stroke mimic or reactivation of her prior stroke symptoms.  Patient mentating better after fluids.  She denies any abdominal pain, issues with urination.  Patient appears clinically dry with dry mucous membranes. pH 7.134, likely from renal issue. Nephrology was consulted and they recommend starting patient on a bicarb drip with aggressive hydration at 125 an hour.  Will give patient additional IV normal saline bolus.  They recommend getting a renal ultrasound and urine electrolytes as well.  Patient to be admitted to hospitalist medicine for further care.  Hemodynamically stable following fluids.  Final Clinical Impressions(s) / ED Diagnoses   Final diagnoses:  Hyperkalemia  Dehydration  AKI (acute kidney injury) [facility])  Confusion    ED Discharge Orders    None      Lennice Sites, DO 01/01/18 2038  Lennice Sites, DO 01/01/18 2102  Elnora Morrison, MD 01/01/18 2317

## 2018-01-01 NOTE — ED Notes (Signed)
Dr. Ronnald Nian notified of pt's CBG of 40.

## 2018-01-02 ENCOUNTER — Inpatient Hospital Stay (HOSPITAL_COMMUNITY): Payer: Medicaid Other

## 2018-01-02 ENCOUNTER — Encounter (HOSPITAL_COMMUNITY): Payer: Self-pay | Admitting: Nephrology

## 2018-01-02 DIAGNOSIS — E860 Dehydration: Secondary | ICD-10-CM

## 2018-01-02 DIAGNOSIS — R410 Disorientation, unspecified: Secondary | ICD-10-CM

## 2018-01-02 DIAGNOSIS — E875 Hyperkalemia: Secondary | ICD-10-CM

## 2018-01-02 LAB — URINALYSIS, COMPLETE (UACMP) WITH MICROSCOPIC
BILIRUBIN URINE: NEGATIVE
GLUCOSE, UA: NEGATIVE mg/dL
Ketones, ur: NEGATIVE mg/dL
NITRITE: NEGATIVE
PH: 5 (ref 5.0–8.0)
Protein, ur: NEGATIVE mg/dL
SPECIFIC GRAVITY, URINE: 1.011 (ref 1.005–1.030)

## 2018-01-02 LAB — CREATININE, URINE, RANDOM: CREATININE, URINE: 80.57 mg/dL

## 2018-01-02 LAB — CBG MONITORING, ED
GLUCOSE-CAPILLARY: 136 mg/dL — AB (ref 65–99)
GLUCOSE-CAPILLARY: 122 mg/dL — AB (ref 65–99)
Glucose-Capillary: 80 mg/dL (ref 65–99)

## 2018-01-02 LAB — TROPONIN I

## 2018-01-02 LAB — BASIC METABOLIC PANEL
Anion gap: 9 (ref 5–15)
BUN: 58 mg/dL — AB (ref 6–20)
CO2: 21 mmol/L — AB (ref 22–32)
CREATININE: 5.05 mg/dL — AB (ref 0.44–1.00)
Calcium: 8.1 mg/dL — ABNORMAL LOW (ref 8.9–10.3)
Chloride: 108 mmol/L (ref 101–111)
GFR calc non Af Amer: 9 mL/min — ABNORMAL LOW (ref >60)
GFR, EST AFRICAN AMERICAN: 10 mL/min — AB (ref >60)
GLUCOSE: 153 mg/dL — AB (ref 65–99)
Potassium: 5.4 mmol/L — ABNORMAL HIGH (ref 3.5–5.1)
Sodium: 138 mmol/L (ref 135–145)

## 2018-01-02 LAB — RAPID URINE DRUG SCREEN, HOSP PERFORMED
AMPHETAMINES: NOT DETECTED
BARBITURATES: NOT DETECTED
Benzodiazepines: NOT DETECTED
Cocaine: NOT DETECTED
Opiates: POSITIVE — AB
TETRAHYDROCANNABINOL: NOT DETECTED

## 2018-01-02 LAB — CBC
HCT: 38.4 % (ref 36.0–46.0)
Hemoglobin: 11.9 g/dL — ABNORMAL LOW (ref 12.0–15.0)
MCH: 28.3 pg (ref 26.0–34.0)
MCHC: 31 g/dL (ref 30.0–36.0)
MCV: 91.4 fL (ref 78.0–100.0)
PLATELETS: 250 10*3/uL (ref 150–400)
RBC: 4.2 MIL/uL (ref 3.87–5.11)
RDW: 15 % (ref 11.5–15.5)
WBC: 8.6 10*3/uL (ref 4.0–10.5)

## 2018-01-02 LAB — SODIUM, URINE, RANDOM: Sodium, Ur: 41 mmol/L

## 2018-01-02 MED ORDER — ASPIRIN EC 81 MG PO TBEC
81.0000 mg | DELAYED_RELEASE_TABLET | Freq: Every day | ORAL | Status: DC
Start: 2018-01-02 — End: 2018-01-06
  Administered 2018-01-02 – 2018-01-06 (×5): 81 mg via ORAL
  Filled 2018-01-02 (×5): qty 1

## 2018-01-02 MED ORDER — LORAZEPAM 2 MG/ML IJ SOLN
2.0000 mg | INTRAMUSCULAR | Status: DC | PRN
  Administered 2018-01-02: 2 mg via INTRAVENOUS
  Filled 2018-01-02: qty 1

## 2018-01-02 NOTE — Progress Notes (Signed)
[place] TEAM 1 - Stepdown/ICU TEAM  Kelsey Wade  HQI:[id number] DOB: [birth year] DOA: 01/01/2018 PCP: Elenore Paddy, FNP    Brief Narrative:  57yo F w/ hx of ongoing smoking, alcohol abuse (minimum of 3 beers/day), CVA with residual right-sided weakness, chronic back shoulder and hip pain on opioid therapy, and depression/anxiety who presented to the ED with confusion.  The husband reported over 3 days the patient had progressive confusion as well as sleepiness. He also noted some tremulousness and shaking of her extremities.   In the ED she was found to have a systolic blood pressure of 83, normal heart rate, potassium 6.9, creatinine 5.7. CT head did not reveal acute findings but did show evidence of prior infarcts.   Significant Events: 4/14 admit   Subjective: The patient is agitated and confused.  She cannot provide a reliable history.  She simply keeps stating "I gotta pee" and is not able to be redirected.  There is no evidence of respiratory distress or uncontrolled pain otherwise.  I did confirm that she has a Foley in place which is actively draining urine.  Assessment & Plan:  Acute kidney failure  Renal US w/o hydro - Nephrology following - hydrate and follow   Recent Labs  Lab 01/01/18 1815 01/01/18 2314 01/02/18 0308  CREATININE 5.73* 5.28* 5.05*    Hyperkalemia Stable w/ use of bicarbonate - follow   Toxic metabolic encephalopathy neurontin + narcotics in setting of acute kidney failure/uremia as well as probable element of EtOH withdrawal  EtOH abuse - acute EtOH withdrawal Cont ativan per CIWA protocol   HTN BP controlled  Hx of CVA June 2018  Depression / Anxiety   Chronic Pain   DVT prophylaxis: SQ heparin  Code Status: FULL CODE Family Communication: no family present at time of exam  Disposition Plan: SDU  Consultants:  Nephrology   Antimicrobials:  none  Objective: Blood pressure (!) 146/95, pulse 84, temperature 98.3 F  (36.8 C), temperature source Oral, resp. rate 19, height 5\' 7"  (1.702 m), weight 59 kg (130 lb), SpO2 (!) 87 %.  Intake/Output Summary (Last 24 hours) at 01/02/2018 1501 Last data filed at 01/01/2018 2204 Gross per 24 hour  Intake 2250 ml  Output -  Net 2250 ml   Filed Weights   01/01/18 1757  Weight: 59 kg (130 lb)    Examination: General: No acute respiratory distress Lungs: Clear to auscultation bilaterally without wheezes or crackles Cardiovascular: Regular rate and rhythm without murmur gallop or rub normal S1 and S2 Abdomen: Nontender, nondistended, soft, bowel sounds positive, no rebound, no ascites, no appreciable mass Extremities: No significant cyanosis, clubbing, or edema bilateral lower extremities  CBC: Recent Labs  Lab 01/01/18 1815 01/02/18 0308  WBC 9.0 8.6  NEUTROABS 5.2  --   HGB 14.5 11.9*  HCT 44.9 38.4  MCV 90.5 91.4  PLT 268 132   Basic Metabolic Panel: Recent Labs  Lab 01/01/18 1815 01/01/18 2314 01/02/18 0308  NA 140 139 138  K 6.9* 5.4* 5.4*  CL 110 111 108  CO2 19* 19* 21*  GLUCOSE 85 294* 153*  BUN 63* 58* 58*  CREATININE 5.73* 5.28* 5.05*  CALCIUM 8.9 8.3* 8.1*   GFR: Estimated Creatinine Clearance: 11.7 mL/min (A) (by C-G formula based on SCr of 5.05 mg/dL (H)).  Liver Function Tests: Recent Labs  Lab 01/01/18 1815  AST 10*  ALT 7*  ALKPHOS 91  BILITOT 0.4  PROT 7.3  ALBUMIN 3.7    Recent  Labs  Lab 01/01/18 1815  AMMONIA 32    Cardiac Enzymes: Recent Labs  Lab 01/02/18 0607  TROPONINI <0.03    HbA1C: Hgb A1c MFr Bld  Date/Time Value Ref Range Status  03/03/2017 03:51 AM 5.5 4.8 - 5.6 % Final    Comment:    (NOTE)         Pre-diabetes: 5.7 - 6.4         Diabetes: >6.4         Glycemic control for adults with diabetes: <7.0     CBG: Recent Labs  Lab 01/01/18 2118 01/01/18 2211 01/02/18 0417 01/02/18 0741 01/02/18 1318  GLUCAP 40* 151* 136* 122* 80     Scheduled Meds: . aspirin  325 mg Oral  Daily  . atorvastatin  40 mg Oral Daily  . folic acid  1 mg Oral Daily  . heparin  5,000 Units Subcutaneous Q8H  . multivitamin with minerals  1 tablet Oral Daily  . sodium chloride flush  3 mL Intravenous Q12H  . thiamine  100 mg Oral Daily   Or  . thiamine  100 mg Intravenous Daily   Continuous Infusions: .  sodium bicarbonate (isotonic) infusion in sterile water 125 mL/hr at 01/02/18 1048     LOS: 1 day   Cherene Altes, MD [facility] Office  [phone] Pager - Text Page per Shea Evans as per below:  On-Call/Text Page:      [email or web address]      password [email or web address]  If 7PM-7AM, please contact night-coverage [email or web address] Password [facility] 01/02/2018, 3:01 PM

## 2018-01-02 NOTE — ED Notes (Signed)
Care taken over for PT. D10 stopped at this time. Approximately 800 cc infused

## 2018-01-02 NOTE — ED Notes (Addendum)
Pt previously agitated but responsive to questions; now answering unintelligibly; doctor paged Dr responded approx 1645; believed to be effect of ativan; no new orders at this time  1715 can appropriately answer questions for CIWA

## 2018-01-02 NOTE — Consult Note (Signed)
Renal Service Consult Note [facility]  Kelsey Wade 01/02/2018 Sol Blazing Requesting Physician:  Dr Ronnald Nian, ER [place]  Reason for Consult: Acute renal failure HPI: The patient is a 57 y.o. year-old with hx of HTN, HL, ETOH abuse, anxiety/ depression, chronic pain syndrome, CVA in June 2018 presented to ED yesterday w/ AMS.  BP in ED was 80/ 60 and creat 5.7 w/ K+ 6.9.  She rec'd IV NS bolus w/ improved BP"s and was admitted.  Asked to see for renal failure.    Unable to get any good history, pt is awake but confused/ disoriented.   Denies any CP, abd pain at this time.      Date  Creat  eGFR 2018  0.5- 0.27 Sep 2017 0.71  > 60 Mar 2019 1.6- 1.8 33 01/01/18 5.73  10 01/02/18 5.05  Echart: 11/1317 - ED visit for speech difficulty and R sided symptoms. CT neg for CVA. Mild AKI and PNA on CXR. Pt refused admission, dc'd on po abx after stabilized.  10/01/17 - ED visit for fall out of bed, found on floor by husband.  Hx CVA w/ mild R hemiparesis, also chronic pain on percocets. Xrays showed old compression fx's and DDD, no acute findings. DC home on nsaids.  6/18 - admit for acute CVA w/ R hemiparesis and L brain CVA.  Had ICA stenosis on L, stroke felt to be embolic from this.  Rx long term ASA, DAP Rx for 3 mos w/ plavix + asa.  Quit smoking. Echo no source of emboli.   ROS  denies CP  no joint pain   no HA  no blurry vision  no rash   Past Medical History  Past Medical History:  Diagnosis Date  . Anxiety   . Arthritis   . Chronic back pain   . Depression   . ETOH abuse   . GERD (gastroesophageal reflux disease)   . HTN (hypertension)   . Hyperlipidemia   . Stroke [facility])    June 2018  . Tobacco abuse    Past Surgical History  Past Surgical History:  Procedure Laterality Date  . APPENDECTOMY     Family History  Family History  Problem Relation Age of Onset  . Alcohol abuse Father   . Alcohol abuse Brother   . Breast cancer Maternal Aunt   .  Colon cancer Neg Hx   . Esophageal cancer Neg Hx   . Stomach cancer Neg Hx   . Rectal cancer Neg Hx    Social History  reports that she has been smoking cigarettes.  She has been smoking about 0.50 packs per day. She has never used smokeless tobacco. She reports that she drinks alcohol. She reports that she has current or past drug history. Drug: Marijuana. Allergies  Allergies  Allergen Reactions  . Lamictal [Lamotrigine] Other (See Comments)    Causing dizziness, hurting all over the body and confusion.   Home medications Prior to Admission medications   Medication Sig Start Date End Date Taking? Authorizing Provider  albuterol (PROAIR HFA) 108 (90 Base) MCG/ACT inhaler Inhale 2 puffs into the lungs every 6 (six) hours as needed for wheezing or shortness of breath.   Yes [provider]  aspirin 325 MG tablet Take 325 mg by mouth daily.   Yes [provider]  atorvastatin (LIPITOR) 40 MG tablet Take 1 tablet (40 mg total) by mouth daily. 03/04/17  Yes Barton Dubois, MD  gabapentin (NEURONTIN) 600 MG tablet  Take 1,800 mg by mouth daily.  11/17/17  Yes [provider]  hydrOXYzine (VISTARIL) 50 MG capsule Take one to two capsule daily t bed time for insomnia Patient taking differently: Take 50-100 mg by mouth at bedtime. For insomnia 12/22/17  Yes Arfeen, Arlyce Harman, MD  lisinopril (PRINIVIL,ZESTRIL) 10 MG tablet Take 10 mg by mouth daily. 11/21/17  Yes [provider]  Oxycodone HCl 10 MG TABS Take 10 mg by mouth 5 (five) times daily.  12/30/17  Yes [provider]  venlafaxine XR (EFFEXOR-XR) 150 MG 24 hr capsule Take 1 capsule (150 mg total) by mouth daily with breakfast. 12/22/17 12/22/18 Yes Arfeen, Arlyce Harman, MD  famotidine (PEPCID) 40 MG tablet Take 1 tablet (40 mg total) by mouth at bedtime. Patient not taking: Reported on 01/01/2018 10/03/17 10/03/18  Dorena Dew, FNP   Liver Function Tests Recent Labs  Lab 01/01/18 1815  AST 10*  ALT 7*   ALKPHOS 91  BILITOT 0.4  PROT 7.3  ALBUMIN 3.7   No results for input(s): LIPASE, AMYLASE in the last 168 hours. CBC Recent Labs  Lab 01/01/18 1815 01/02/18 0308  WBC 9.0 8.6  NEUTROABS 5.2  --   HGB 14.5 11.9*  HCT 44.9 38.4  MCV 90.5 91.4  PLT 268 758   Basic Metabolic Panel Recent Labs  Lab 01/01/18 1815 01/01/18 2314 01/02/18 0308  NA 140 139 138  K 6.9* 5.4* 5.4*  CL 110 111 108  CO2 19* 19* 21*  GLUCOSE 85 294* 153*  BUN 63* 58* 58*  CREATININE 5.73* 5.28* 5.05*  CALCIUM 8.9 8.3* 8.1*   Iron/TIBC/Ferritin/ %Sat No results found for: IRON, TIBC, FERRITIN, IRONPCTSAT  Vitals:   01/02/18 0530 01/02/18 0545 01/02/18 0600 01/02/18 0615  BP: 110/74 103/72 116/81 111/74  Pulse: 83 79    Resp: _0 (!) 8  Temp:      TempSrc:      SpO2: 97% 97%    Weight:      Height:       Exam Gen confused, alert, "1963" No rash, cyanosis or gangrene, poor skin turgor Sclera anicteric, throat clear and very dry  No jvd or bruits Chest clear bilat , no rales or wheezing RRR no MRG Abd soft ntnd no mass or ascites +bs GU w/ foley in place MS no joint effusions or deformity Ext no LE or UE edema, no wounds or ulcer Neuro is alert, Ox 1, confused/ disoriented , nf, moves R and L side equally strong  UA > 1.011, 5/0, prot neg, 0-5 rbc/ 6-30 wbc UNa - 40 UCr - 81  Home meds: - lisinopril 10 mg qd - effexor xr 150/ neurontin 1800 mg qd/ oxycodone 10 mg 4-5 x per day - ecasa/ statin/ PPI/ prns   Impression: 1  Renal failure - in patient w/ uncertain recent hx, poor historian,  + ACEi/ hypotension/ dehydration per exam. Suspect hemodynamic vs ATN.  UA unremarkable, renal US in progress.  Supportive care, renal diet for now, cont IVF"s w/ bicarb at 100- 150 /hr for now.  Keep foley in for now.  No indication for RRT at this time.  2  AMS - prob due to neurontin/ AKI/ narcotics.  Hx of CVA as well.  3  H/o CVA - June 2018.  Not much residual on exam today.  4  HTN -  holding acei, BP's soft 5  Hypotension/ vol depletion - imrpoving w/ IVF's 6  Anxiety / depression /  chronic pain - avoid neurontin for now 7  Tobacco 8  Hx etoh abuse - levels were negative here   Plan - as above, will follow.   Kelly Splinter MD [employer] pager [phone]   01/02/2018, 7:11 AM

## 2018-01-03 DIAGNOSIS — F10239 Alcohol dependence with withdrawal, unspecified: Secondary | ICD-10-CM

## 2018-01-03 LAB — COMPREHENSIVE METABOLIC PANEL
ALT: 8 U/L — AB (ref 14–54)
AST: 18 U/L (ref 15–41)
Albumin: 3.1 g/dL — ABNORMAL LOW (ref 3.5–5.0)
Alkaline Phosphatase: 78 U/L (ref 38–126)
Anion gap: 13 (ref 5–15)
BUN: 43 mg/dL — AB (ref 6–20)
CHLORIDE: 101 mmol/L (ref 101–111)
CO2: 30 mmol/L (ref 22–32)
CREATININE: 4.09 mg/dL — AB (ref 0.44–1.00)
Calcium: 8.6 mg/dL — ABNORMAL LOW (ref 8.9–10.3)
GFR calc Af Amer: 13 mL/min — ABNORMAL LOW (ref >60)
GFR, EST NON AFRICAN AMERICAN: 11 mL/min — AB (ref >60)
Glucose, Bld: 115 mg/dL — ABNORMAL HIGH (ref 65–99)
POTASSIUM: 4.4 mmol/L (ref 3.5–5.1)
SODIUM: 144 mmol/L (ref 135–145)
Total Bilirubin: 0.5 mg/dL (ref 0.3–1.2)
Total Protein: 6.2 g/dL — ABNORMAL LOW (ref 6.5–8.1)

## 2018-01-03 LAB — CBC
HEMATOCRIT: 43.5 % (ref 36.0–46.0)
Hemoglobin: 14 g/dL (ref 12.0–15.0)
MCH: 28.5 pg (ref 26.0–34.0)
MCHC: 32.2 g/dL (ref 30.0–36.0)
MCV: 88.6 fL (ref 78.0–100.0)
PLATELETS: 238 10*3/uL (ref 150–400)
RBC: 4.91 MIL/uL (ref 3.87–5.11)
RDW: 13.9 % (ref 11.5–15.5)
WBC: 8.8 10*3/uL (ref 4.0–10.5)

## 2018-01-03 LAB — URINE CULTURE: CULTURE: NO GROWTH

## 2018-01-03 LAB — MRSA PCR SCREENING: MRSA by PCR: NEGATIVE

## 2018-01-03 MED ORDER — HYDRALAZINE HCL 20 MG/ML IJ SOLN
10.0000 mg | Freq: Once | INTRAMUSCULAR | Status: AC
  Administered 2018-01-03: 10 mg via INTRAVENOUS
  Filled 2018-01-03: qty 1

## 2018-01-03 MED ORDER — LORAZEPAM 2 MG/ML IJ SOLN: 1.0000 mg | INTRAMUSCULAR | Status: DC | PRN

## 2018-01-03 MED ORDER — HYDROXYZINE PAMOATE 50 MG PO CAPS
50.0000 mg | ORAL_CAPSULE | Freq: Every day | ORAL | Status: DC
Start: 2018-01-03 — End: 2018-01-03

## 2018-01-03 MED ORDER — HYDROXYZINE PAMOATE 50 MG PO CAPS
50.0000 mg | ORAL_CAPSULE | Freq: Every day | ORAL | Status: DC
  Filled 2018-01-03: qty 1

## 2018-01-03 MED ORDER — LORAZEPAM 2 MG/ML IJ SOLN
1.0000 mg | INTRAMUSCULAR | Status: DC | PRN
  Administered 2018-01-03: 1 mg via INTRAVENOUS
  Administered 2018-01-03: 2 mg via INTRAVENOUS
  Filled 2018-01-03 (×2): qty 1

## 2018-01-03 MED ORDER — HYDROXYZINE HCL 25 MG PO TABS
50.0000 mg | ORAL_TABLET | Freq: Every day | ORAL | Status: DC
  Administered 2018-01-03 – 2018-01-05 (×3): 50 mg via ORAL
  Filled 2018-01-03 (×3): qty 2

## 2018-01-03 MED ORDER — SODIUM CHLORIDE 0.9 % IV SOLN
INTRAVENOUS | Status: DC
  Administered 2018-01-03 – 2018-01-04 (×2): via INTRAVENOUS

## 2018-01-03 MED ORDER — CLONIDINE HCL 0.1 MG PO TABS
0.1000 mg | ORAL_TABLET | Freq: Three times a day (TID) | ORAL | Status: DC
  Administered 2018-01-03 – 2018-01-06 (×9): 0.1 mg via ORAL
  Filled 2018-01-03 (×9): qty 1

## 2018-01-03 NOTE — Progress Notes (Signed)
[place] TEAM 1 - Stepdown/ICU TEAM  Kelsey Wade  OJJ:[id number] DOB: 1961/08/22 DOA: 01/01/2018 PCP: Elenore Paddy, FNP    Brief Narrative:  57yo F w/ hx of ongoing smoking, alcohol abuse (minimum of 3 beers/day), CVA with residual right-sided weakness, chronic back shoulder and hip pain on opioid therapy, and depression/anxiety who presented to the ED with confusion.  The husband reported over 3 days the patient had progressive confusion as well as sleepiness. He also noted some tremulousness and shaking of her extremities.   In the ED she was found to have a systolic blood pressure of 83, normal heart rate, potassium 6.9, creatinine 5.7. CT head did not reveal acute findings but did show evidence of prior infarcts.   Significant Events: 4/14 admit   Subjective: The patient remains very confused.  She cannot provide a reliable history.  She is quite agitated and fidgeting about in the bed.  There is no evidence of acute respiratory distress.  There is no evidence of uncontrolled pain.  Assessment & Plan:  Acute kidney failure  Renal US w/o hydro - Nephrology has evaluated - crt is improving w/ volume expansion - cont and follow  Recent Labs  Lab 01/01/18 1815 01/01/18 2314 01/02/18 0308 01/03/18 0344  CREATININE 5.73* 5.28* 5.05* 4.09*    Hyperkalemia Corrected   Toxic metabolic encephalopathy neurontin + narcotics in setting of acute kidney failure/uremia as well as probable element of EtOH withdrawal - cont CIWA - avoid neurontin and narcotics until renal fxn improved   EtOH abuse - acute EtOH withdrawal Cont ativan per CIWA protocol - remains agitated at this time   HTN BP trending up - likely driven by agitation from withdrawal - add clonidine and follow   Hx of CVA June 2018  Depression / Anxiety   Chronic Pain   DVT prophylaxis: SQ heparin  Code Status: FULL CODE Family Communication: no family present at time of exam  Disposition Plan:  SDU  Consultants:  Nephrology   Antimicrobials:  none  Objective: Blood pressure (!) 151/94, pulse 74, temperature 98.9 F (37.2 C), temperature source Oral, resp. rate 19, height 5\' 7"  (1.702 m), weight 57.7 kg (127 lb 3.3 oz), SpO2 91 %.  Intake/Output Summary (Last 24 hours) at 01/03/2018 1323 Last data filed at 01/03/2018 1031 Gross per 24 hour  Intake 1814.58 ml  Output 2800 ml  Net -985.42 ml   Filed Weights   01/01/18 1757 01/02/18 1900 01/03/18 0500  Weight: 59 kg (130 lb) 61.4 kg (135 lb 5.8 oz) 57.7 kg (127 lb 3.3 oz)    Examination: General: No acute respiratory distress Lungs: CTA B - no wheezing  Cardiovascular: RRR - no M or rub  Abdomen: NT/ND, soft, BS+, no mass  Extremities: No edema B LE   CBC: Recent Labs  Lab 01/01/18 1815 01/02/18 0308 01/03/18 0344  WBC 9.0 8.6 8.8  NEUTROABS 5.2  --   --   HGB 14.5 11.9* 14.0  HCT 44.9 38.4 43.5  MCV 90.5 91.4 88.6  PLT 268 250 937   Basic Metabolic Panel: Recent Labs  Lab 01/01/18 2314 01/02/18 0308 01/03/18 0344  NA 139 138 144  K 5.4* 5.4* 4.4  CL 111 108 101  CO2 19* 21* 30  GLUCOSE 294* 153* 115*  BUN 58* 58* 43*  CREATININE 5.28* 5.05* 4.09*  CALCIUM 8.3* 8.1* 8.6*   GFR: Estimated Creatinine Clearance: 14.2 mL/min (A) (by C-G formula based on SCr of 4.09 mg/dL (H)).  Liver  Function Tests: Recent Labs  Lab 01/01/18 1815 01/03/18 0344  AST 10* 18  ALT 7* 8*  ALKPHOS 91 78  BILITOT 0.4 0.5  PROT 7.3 6.2*  ALBUMIN 3.7 3.1*    Recent Labs  Lab 01/01/18 1815  AMMONIA 32    Cardiac Enzymes: Recent Labs  Lab 01/02/18 0607  TROPONINI <0.03    HbA1C: Hgb A1c MFr Bld  Date/Time Value Ref Range Status  03/03/2017 03:51 AM 5.5 4.8 - 5.6 % Final    Comment:    (NOTE)         Pre-diabetes: 5.7 - 6.4         Diabetes: >6.4         Glycemic control for adults with diabetes: <7.0     CBG: Recent Labs  Lab 01/01/18 2118 01/01/18 2211 01/02/18 0417 01/02/18 0741  01/02/18 1318  GLUCAP 40* 151* 136* 122* 80     Scheduled Meds: . aspirin EC  81 mg Oral Daily  . atorvastatin  40 mg Oral Daily  . folic acid  1 mg Oral Daily  . heparin  5,000 Units Subcutaneous Q8H  . multivitamin with minerals  1 tablet Oral Daily  . sodium chloride flush  3 mL Intravenous Q12H  . thiamine  100 mg Oral Daily     LOS: 2 days   Cherene Altes, MD [facility] Office  [phone] Pager - Text Page per Shea Evans as per below:  On-Call/Text Page:      [email or web address]      password [email or web address]  If 7PM-7AM, please contact night-coverage [email or web address] Password [email or web address] 01/03/2018, 1:23 PM

## 2018-01-03 NOTE — Progress Notes (Signed)
[facility] Progress Note    Assessment/ Plan:    1  AKI:   + ACEi/ hypotension/ dehydration per exam. Suspect hemodynamic vs ATN.  UA unremarkable, renal US without hydro.  Cr is improving and bicarb 30 on chem--stop bicarb gtt.  No need for RRT.  2  AMS - prob due to neurontin/ AKI/ narcotics.  Hx of CVA as well. Appears to be alert but orientation not improved.  3  H/o CVA - June 2018.  .   4  HTN - holding acei, BP's soft  5  Anxiety / depression / chronic pain - avoid neurontin for now in the setting of AKI and possible neurontin toxicity  6 Hx etoh abuse - levels were negative here.  On CIWA    Subjective:    Keeps repeating herself over and over.     Objective:   BP (!) 151/94 (BP Location: Right Arm)   Pulse 74   Temp 99 F (37.2 C) (Oral)   Resp 19   Ht 5\' 7"  (1.702 m)   Wt 57.7 kg (127 lb 3.3 oz)   SpO2 91%   BMI 19.92 kg/m   Intake/Output Summary (Last 24 hours) at 01/03/2018 1421 Last data filed at 01/03/2018 1343 Gross per 24 hour  Intake 1934.58 ml  Output 3700 ml  Net -1765.42 ml   Weight change: 2.432 kg (5 lb 5.8 oz)  Physical Exam: Gen alert and confused, husband at bedside HEENT: tacky MM NECK: flat neck veins CV: RRR PULM clear ABD: soft nontender EXT: no LE edema NEURO: AAO x 1, R hand weakness in brace    Imaging: Ct Head Wo Contrast  Result Date: 01/01/2018 CLINICAL DATA:  Altered level of consciousness, LEFT side weakness, history of stroke with RIGHT-sided deficits, hypertension, smoker EXAM: CT HEAD WITHOUT CONTRAST TECHNIQUE: Contiguous axial images were obtained from the base of the skull through the vertex without intravenous contrast. COMPARISON:  11/30/2017 FINDINGS: Brain: Normal ventricular morphology. No midline shift or mass effect. Old infarcts in LEFT frontal and parietal lobes extending to the LEFT occipital lobe. Small old RIGHT frontal and parietal infarcts. Probable tiny old infarct at RIGHT  cerebellum. No intracranial hemorrhage, mass lesion or evidence of acute infarction. No extra-axial fluid collections. Vascular: Atherosclerotic calcifications at the internal carotid arteries bilaterally at the skull base Skull: Unremarkable Sinuses/Orbits: Clear Other: N/A IMPRESSION: Old BILATERAL infarcts. No acute intracranial abnormalities. Electronically Signed   By: Lavonia Dana M.D.   On: 01/01/2018 19:05   US Renal  Result Date: 01/02/2018 CLINICAL DATA:  Acute kidney injury. EXAM: RENAL / URINARY TRACT ULTRASOUND COMPLETE COMPARISON:  CT abdomen and pelvis 09/16/2017 FINDINGS: Right Kidney: Length: 12.3 cm, within normal limits. Renal parenchyma is isoechoic to the index organ, the liver. No mass or hydronephrosis visualized. Left Kidney: Length: 11.2 cm, within normal limits. Renal parenchyma is isoechoic to the index organ, the spleen. No mass or hydronephrosis visualized. Bladder: The urinary bladder is collapsed around a Foley catheter. IMPRESSION: 1. Increased parenchymal echogenicity bilaterally. This is nonspecific, but can be seen in medical renal disease. 2. No focal mass lesion or hydronephrosis. 3. Foley catheter in situ. Electronically Signed   By: San Morelle M.D.   On: 01/02/2018 07:23   Dg Chest Portable 1 View  Result Date: 01/01/2018 CLINICAL DATA:  Altered EXAM: PORTABLE CHEST 1 VIEW COMPARISON:  11/30/2017, 10/01/2017, 03/02/2017 FINDINGS: Mild cardiomegaly with vascular congestion. Mild diffuse interstitial opacities suggesting minimal edema. Mild bibasilar airspace disease.  No pneumothorax. IMPRESSION: 1. Hazy bibasilar airspace disease, atelectasis versus pneumonia 2. Mild cardiomegaly with vascular congestion and mild diffuse interstitial opacities suspect for mild interstitial edema Electronically Signed   By: Donavan Foil M.D.   On: 01/01/2018 18:59    Labs: BMET Recent Labs  Lab 01/01/18 1815 01/01/18 2314 01/02/18 0308 01/03/18 0344  NA 140 139 138  144  K 6.9* 5.4* 5.4* 4.4  CL 110 111 108 101  CO2 19* 19* 21* 30  GLUCOSE 85 294* 153* 115*  BUN 63* 58* 58* 43*  CREATININE 5.73* 5.28* 5.05* 4.09*  CALCIUM 8.9 8.3* 8.1* 8.6*   CBC Recent Labs  Lab 01/01/18 1815 01/02/18 0308 01/03/18 0344  WBC 9.0 8.6 8.8  NEUTROABS 5.2  --   --   HGB 14.5 11.9* 14.0  HCT 44.9 38.4 43.5  MCV 90.5 91.4 88.6  PLT 268 250 238    Medications:    . aspirin EC  81 mg Oral Daily  . atorvastatin  40 mg Oral Daily  . cloNIDine  0.1 mg Oral TID  . folic acid  1 mg Oral Daily  . heparin  5,000 Units Subcutaneous Q8H  . hydrOXYzine  50 mg Oral QHS  . multivitamin with minerals  1 tablet Oral Daily  . thiamine  100 mg Oral Daily      Madelon Lips, MD [place] pgr [phone] 01/03/2018, 2:21 PM

## 2018-01-04 DIAGNOSIS — F10939 Alcohol use, unspecified with withdrawal, unspecified: Secondary | ICD-10-CM

## 2018-01-04 DIAGNOSIS — F10230 Alcohol dependence with withdrawal, uncomplicated: Secondary | ICD-10-CM

## 2018-01-04 DIAGNOSIS — G92 Toxic encephalopathy: Secondary | ICD-10-CM

## 2018-01-04 DIAGNOSIS — F1010 Alcohol abuse, uncomplicated: Secondary | ICD-10-CM

## 2018-01-04 DIAGNOSIS — N179 Acute kidney failure, unspecified: Principal | ICD-10-CM

## 2018-01-04 DIAGNOSIS — G928 Other toxic encephalopathy: Secondary | ICD-10-CM

## 2018-01-04 DIAGNOSIS — F10239 Alcohol dependence with withdrawal, unspecified: Secondary | ICD-10-CM

## 2018-01-04 LAB — BASIC METABOLIC PANEL
ANION GAP: 10 (ref 5–15)
BUN: 30 mg/dL — ABNORMAL HIGH (ref 6–20)
CALCIUM: 8.4 mg/dL — AB (ref 8.9–10.3)
CO2: 28 mmol/L (ref 22–32)
Chloride: 104 mmol/L (ref 101–111)
Creatinine, Ser: 2.96 mg/dL — ABNORMAL HIGH (ref 0.44–1.00)
GFR, EST AFRICAN AMERICAN: 19 mL/min — AB (ref >60)
GFR, EST NON AFRICAN AMERICAN: 17 mL/min — AB (ref >60)
Glucose, Bld: 91 mg/dL (ref 65–99)
Potassium: 3.5 mmol/L (ref 3.5–5.1)
SODIUM: 142 mmol/L (ref 135–145)

## 2018-01-04 LAB — PHOSPHORUS: PHOSPHORUS: 4.4 mg/dL (ref 2.5–4.6)

## 2018-01-04 LAB — MAGNESIUM: MAGNESIUM: 1.7 mg/dL (ref 1.7–2.4)

## 2018-01-04 NOTE — Progress Notes (Signed)
PT Cancellation Note  Patient Details Name: Kelsey Wade MRN: [id number] DOB: [birth year]   Cancelled Treatment:    Reason Eval/Treat Not Completed: Patient declined, no reason specified Attempted to perform PT evaluation, however patient declines stating "no not now". Plan to attempt to return later in day if schedule allows.   Deniece Ree PT, DPT, CBIS  Supplemental Physical Therapist [facility]   Pager [phone]

## 2018-01-04 NOTE — Progress Notes (Signed)
  [facility] Progress Note    Assessment/ Plan:    1  AKI:   + ACEi/ hypotension/ dehydration per exam. Suspect hemodynamic vs ATN.  UA unremarkable, renal US without hydro.  Cr is improving and I have d/c'd all fluids.  I'll sign off now.  Don't think she'll need f/u with our practice unless she has persistent sig CKD after rcovery  2  AMS - prob due to neurontin/ AKI/ narcotics.  Hx of CVA as well. Appears to be alert but orientation not improved.  3  H/o CVA - June 2018.  .   4  HTN - holding acei, BP's soft  5  Anxiety / depression / chronic pain - avoid neurontin for now in the setting of AKI and possible neurontin toxicity  6 Hx etoh abuse - levels were negative here.  On CIWA    Subjective:    Angry that she didn't get breakfast today- but looks like she needs help ordering.   Objective:   BP (!) 168/98 (BP Location: Right Arm)   Pulse 73   Temp 98 F (36.7 C) (Oral)   Resp 15   Ht 5\' 7"  (1.702 m)   Wt 60.1 kg (132 lb 7.9 oz)   SpO2 96%   BMI 20.75 kg/m   Intake/Output Summary (Last 24 hours) at 01/04/2018 1238 Last data filed at 01/04/2018 4008 Gross per 24 hour  Intake 338.75 ml  Output 2350 ml  Net -2011.25 ml   Weight change: -1.3 kg (-2 lb 13.9 oz)  Physical Exam: Gen alert and confused HEENT: MMM, wearing glasses NECK: flat neck veins CV: RRR PULM clear ABD: soft nontender EXT: no LE edema NEURO: AAO x 1, R hand weakness in brace    Imaging: No results found.  Labs: BMET Recent Labs  Lab 01/01/18 1815 01/01/18 2314 01/02/18 0308 01/03/18 0344 01/04/18 0634  NA 140 139 138 144 142  K 6.9* 5.4* 5.4* 4.4 3.5  CL 110 111 108 101 104  CO2 19* 19* 21* 30 28  GLUCOSE 85 294* 153* 115* 91  BUN 63* 58* 58* 43* 30*  CREATININE 5.73* 5.28* 5.05* 4.09* 2.96*  CALCIUM 8.9 8.3* 8.1* 8.6* 8.4*  PHOS  --   --   --   --  4.4   CBC Recent Labs  Lab 01/01/18 1815 01/02/18 0308 01/03/18 0344  WBC 9.0 8.6 8.8  NEUTROABS 5.2   --   --   HGB 14.5 11.9* 14.0  HCT 44.9 38.4 43.5  MCV 90.5 91.4 88.6  PLT 268 250 238    Medications:    . aspirin EC  81 mg Oral Daily  . atorvastatin  40 mg Oral Daily  . cloNIDine  0.1 mg Oral TID  . folic acid  1 mg Oral Daily  . heparin  5,000 Units Subcutaneous Q8H  . hydrOXYzine  50 mg Oral QHS  . multivitamin with minerals  1 tablet Oral Daily  . thiamine  100 mg Oral Daily      Madelon Lips, MD [place] pgr [phone] 01/04/2018, 12:38 PM

## 2018-01-04 NOTE — Progress Notes (Signed)
  PROGRESS NOTE  Kelsey Wade [id number] DOB: May 07, 1961 DOA: 01/01/2018 PCP: Elenore Paddy, FNP  Brief Narrative: 57 year old woman PMH stroke with right-sided weakness, chronic pain on opioid therapy, depression, anxiety, alcohol abuse 3 beers daily, presented to urgent care with several day history of progressive confusion, sleepiness, impaired memory, tremulousness.  Sent to emergency department, found to have acute kidney injury with creatinine 5.7, potassium 6.9.    Assessment/Plan AKI with acute metabolic acidosis on admission, with hyperkalemia on admission with peak T waves.  Thought secondary to hypotension, dehydration.  Complicated by ACE inhibitor use.  Renal ultrasound no hydronephrosis.  Possible medical renal disease.  Treated with bicarbonate infusion per nephrology. --Potassium within normal limits.  Creatinine continues to trend downwards.  Management per nephrology.  Acute toxic metabolic encephalopathy, thought multifactorial, AKI, gabapentin, narcotics in the setting of acute kidney injury, acute alcohol withdrawal.  CT head no acute abnormalities. --Appears to be resolving.  Alcohol abuse, acute alcohol withdrawal --Appears to be resolved.  Now Ativan since yesterday at 1252.  PMH stroke with chronic right-sided deficits. --Continue statin, aspirin  Chronic back pain --Appears stable.  Current smoker --Recommend cessation   Appears to be improving, still mildly confused.  Advance diet.  PT evaluation.  Hopefully home next 48 hours.  DVT prophylaxis: heparin Code Status: full Family Communication: none Disposition Plan: home    Murray Hodgkins, MD  [facility] Direct contact: [phone] --Via [place]  --[email or web address]; password [email or web address]  7PM-7AM contact night coverage as above 01/04/2018, 10:51 AM  LOS: 3 days   Consultants:  Nephrology  Procedures:    Antimicrobials:    Interval history/Subjective: Hungry.  Requesting  diet.  No nausea or vomiting.  Objective: Vitals:  Vitals:   01/04/18 0911 01/04/18 0912  BP: (!) 168/98   Pulse: 73   Resp: 15   Temp:  98 F (36.7 C)  SpO2: 96%     Exam:  Constitutional:  . Appears calm and comfortable Eyes:  . pupils and irises appear normal . Normal lids  Respiratory:  . CTA bilaterally, no w/r/r.  . Respiratory effort normal Cardiovascular:  . RRR, no m/r/g . No LE extremity edema   Psychiatric:  . Mental status o Mood, affect appropriate o Orientation to person, [facility], January   I have personally reviewed the following:  Filed Weights   01/02/18 1900 01/03/18 0500 01/04/18 0426  Weight: 61.4 kg (135 lb 5.8 oz) 57.7 kg (127 lb 3.3 oz) 60.1 kg (132 lb 7.9 oz)   Weight change: -1.3 kg (-2 lb 13.9 oz)  UOP: 2600 I/O since admission: -500  Labs:  Blood sugar stable  Creatinine trending down, 5.05 >> 4.09 >> 2.96.  Anion gap within normal limits.  CBC unremarkable 4/16  Scheduled Meds: . aspirin EC  81 mg Oral Daily  . atorvastatin  40 mg Oral Daily  . cloNIDine  0.1 mg Oral TID  . folic acid  1 mg Oral Daily  . heparin  5,000 Units Subcutaneous Q8H  . hydrOXYzine  50 mg Oral QHS  . multivitamin with minerals  1 tablet Oral Daily  . thiamine  100 mg Oral Daily   Continuous Infusions:   Principal Problem:   AKI (acute kidney injury) ([place]) Active Problems:   ETOH abuse   Toxic metabolic encephalopathy   Alcohol withdrawal ([place])   LOS: 3 days

## 2018-01-05 LAB — BASIC METABOLIC PANEL
Anion gap: 11 (ref 5–15)
BUN: 21 mg/dL — AB (ref 6–20)
CHLORIDE: 103 mmol/L (ref 101–111)
CO2: 26 mmol/L (ref 22–32)
CREATININE: 2.38 mg/dL — AB (ref 0.44–1.00)
Calcium: 8.9 mg/dL (ref 8.9–10.3)
GFR calc Af Amer: 25 mL/min — ABNORMAL LOW (ref >60)
GFR calc non Af Amer: 22 mL/min — ABNORMAL LOW (ref >60)
Glucose, Bld: 139 mg/dL — ABNORMAL HIGH (ref 65–99)
POTASSIUM: 3.5 mmol/L (ref 3.5–5.1)
Sodium: 140 mmol/L (ref 135–145)

## 2018-01-05 MED ORDER — OXYCODONE HCL 5 MG PO TABS
10.0000 mg | ORAL_TABLET | Freq: Every day | ORAL | Status: DC
  Administered 2018-01-05 – 2018-01-06 (×5): 10 mg via ORAL
  Filled 2018-01-05 (×5): qty 2

## 2018-01-05 MED ORDER — LACTATED RINGERS IV SOLN
INTRAVENOUS | Status: DC
  Administered 2018-01-05: 17:00:00 via INTRAVENOUS

## 2018-01-05 MED ORDER — OXYCODONE HCL 5 MG PO TABS
5.0000 mg | ORAL_TABLET | Freq: Once | ORAL | Status: AC
  Administered 2018-01-05: 5 mg via ORAL
  Filled 2018-01-05: qty 1

## 2018-01-05 NOTE — Evaluation (Signed)
Physical Therapy Evaluation Patient Details Name: Kelsey Wade MRN: [id number] DOB: 04/24/1961 Today's Date: 01/05/2018   History of Present Illness  Pt is a 57 y/o female with PMH significant for CVA (R hemiparesis), chronic pain, and anxiety, admitted with AKI.   Clinical Impression  Pt tolerated mobility well, with mild<>moderate balance deficits noted with ambulation and standing on compliant surfaces.  Pt very confused throughout session, with difficulty attending to task and following conversation.  Pt would benefit from continued acute PT to maximize safety with mobility in preparation for d/c home with husband to provide 24/7 supervision.  Would strongly recommend HHPT to f/u at d/c to maximize pt's independence and decrease fall risk.     Follow Up Recommendations Home health PT;Supervision/Assistance - 24 hour    Equipment Recommendations  None recommended by PT    Recommendations for Other Services       Precautions / Restrictions Precautions Precautions: Fall Restrictions Weight Bearing Restrictions: No      Mobility  Bed Mobility Overal bed mobility: Modified Independent                Transfers Overall transfer level: Needs assistance Equipment used: None Transfers: Sit to/from Stand Sit to Stand: Min guard         General transfer comment: Pt transfers from EOB several times with sit<>stand and min guard.  Needs encourgaement and cues to progress towards gait once in standing  Ambulation/Gait Ambulation/Gait assistance: Min guard Ambulation Distance (Feet): 100 Feet Assistive device: None Gait Pattern/deviations: Drifts right/left     General Gait Details: mild unsteadiness, no overt LOB  Stairs            Wheelchair Mobility    Modified Rankin (Stroke Patients Only)       Balance Overall balance assessment: Mild deficits observed, not formally tested                                           Pertinent  Vitals/Pain Pain Assessment: No/denies pain    Home Living Family/patient expects to be discharged to:: Private residence Living Arrangements: Spouse/significant other Available Help at Discharge: Family;Available 24 hours/day Type of Home: House Home Access: Stairs to enter Entrance Stairs-Rails: Chemical engineer of Steps: 2 Home Layout: One level Home Equipment: Walker - 2 wheels;Cane - single point      Prior Function Level of Independence: Independent with assistive device(s)         Comments: uses RW and SPC interchangeably depending on the day     Hand Dominance        Extremity/Trunk Assessment        Lower Extremity Assessment Lower Extremity Assessment: Generalized weakness       Communication   Communication: No difficulties  Cognition Arousal/Alertness: Awake/alert Behavior During Therapy: Anxious;Restless Overall Cognitive Status: History of cognitive impairments - at baseline                                 General Comments: Per pt has been having cognitive difficulties since her CVA in 02/2017      General Comments      Exercises     Assessment/Plan    PT Assessment Patient needs continued PT services  PT Problem List Decreased activity tolerance;Decreased balance;Decreased mobility;Decreased coordination;Decreased cognition;Decreased knowledge of use of DME;Decreased  safety awareness       PT Treatment Interventions DME instruction;Gait training;Stair training;Functional mobility training;Therapeutic activities;Therapeutic exercise;Balance training;Cognitive remediation;Patient/family education    PT Goals (Current goals can be found in the Care Plan section)  Acute Rehab PT Goals Patient Stated Goal: to go home today PT Goal Formulation: Patient unable to participate in goal setting Time For Goal Achievement: 01/19/18 Potential to Achieve Goals: Fair    Frequency Min 3X/week   Barriers to discharge  Inaccessible home environment;Decreased caregiver support home with 2 steps to enter, spouse uses a motorized scooter for all mobility    Co-evaluation               AM-PAC PT "6 Clicks" Daily Activity  Outcome Measure Difficulty turning over in bed (including adjusting bedclothes, sheets and blankets)?: None Difficulty moving from lying on back to sitting on the side of the bed? : None Difficulty sitting down on and standing up from a chair with arms (e.g., wheelchair, bedside commode, etc,.)?: A Little Help needed moving to and from a bed to chair (including a wheelchair)?: A Little Help needed walking in hospital room?: A Little Help needed climbing 3-5 steps with a railing? : A Little 6 Click Score: 20    End of Session   Activity Tolerance: Patient tolerated treatment well(pt anxious throughout session, requiring cues to attend) Patient left: in bed;with call bell/phone within reach;with bed alarm set Nurse Communication: Mobility status PT Visit Diagnosis: Unsteadiness on feet (R26.81);Muscle weakness (generalized) (M62.81)    Time: 6712-4580 PT Time Calculation (min) (ACUTE ONLY): 27 min   Charges:   PT Evaluation $PT Eval Moderate Complexity: 1 Mod PT Treatments $Therapeutic Activity: 8-22 mins   PT G Codes:        Shann Medal, PT, DPT  01/05/2018, 2:17 PM

## 2018-01-05 NOTE — Progress Notes (Signed)
   01/05/18 0417  What Happened  Was fall witnessed? Yes  Who witnessed fall?  (tele sitter)  Patients activity before fall to/from bed, chair, or stretcher  Point of contact buttocks  Was patient injured? No  Follow Up  MD notified Baltazar Najjar  Time MD notified [phone]  Family notified Yes-comment (TIm husband)  Time family notified 0420  Additional tests No  Progress note created (see row info) Yes  Adult Fall Risk Assessment  Risk Factor Category (scoring not indicated) Fall has occurred during this admission (document High fall risk)  Age 57  Fall History: Fall within 6 months prior to admission 0  Elimination; Bowel and/or Urine Incontinence 0  Elimination; Bowel and/or Urine Urgency/Frequency 0  Medications: includes PCA/Opiates, Anti-convulsants, Anti-hypertensives, Diuretics, Hypnotics, Laxatives, Sedatives, and Psychotropics 0  Patient Care Equipment 1  Mobility-Assistance 2  Mobility-Gait 2  Mobility-Sensory Deficit 2  Altered awareness of immediate physical environment 1  Impulsiveness 2  Lack of understanding of one's physical/cognitive limitations 4  Total Score 14  Patient's Fall Risk High Fall Risk (>13 points)  Adult Fall Risk Interventions  Required Bundle Interventions *See Row Information* High fall risk - low, moderate, and high requirements implemented  Additional Interventions Camera surveillance (with patient/family notification & education) (tele sitter)  Screening for Fall Injury Risk (To be completed on HIGH fall risk patients) - Assessing Need for Low Bed  Risk For Fall Injury- Low Bed Criteria TeleSitter Camera in use  Will Implement Low Bed and Floor Mats Low bed contraindicated, floor mats in place  Screening for Fall Injury Risk (To be completed on HIGH fall risk patients who do not meet crieteria for Low Bed) - Assessing Need for Floor Mats Only  Risk For Fall Injury- Criteria for Floor Mats None identified - No additional interventions needed  Will  Implement Floor Mats Yes  Pain Assessment  Pain Scale 0-10  Pain Score 0  Neurological  Neuro (WDL) X  Level of Consciousness Alert  Orientation Level Oriented to person;Oriented to place  Cognition Poor attention/concentration;Poor judgement;Poor safety awareness  Speech Clear   Tele sitter alerted nurse that patient sustained a fall. Per tele sitter pt was sitting on the floor. Pt denies any pain or injuries. Husband notified. Provider notified. Will continue to monitor and treat per orders.

## 2018-01-05 NOTE — Progress Notes (Signed)
Pt states she was outside playing with everyone when she fell and she wants to go home. Husband stated he will come get her after the doctor rounds in the morning. Pt states she will call her uncle to come get her to take her home.

## 2018-01-05 NOTE — Progress Notes (Signed)
Tele Sitter called and said she saw pt with pill bottle taking pills out of the bottle.  I went in and pt hid the bottle behind her back.  I asked what she had and she showed me, said they were her Oxycodone.  I took the pills, counted them (14) and took them down to pharmacy to be locked up. Reported to Camera operator and MD.

## 2018-01-05 NOTE — Care Management Note (Addendum)
Case Management Note  Patient Details  Name: Carisa Backhaus MRN: [id number] Date of Birth: 10/28/1960  Subjective/Objective:   AKI . Resides with husband.      Jodelle Gross [address]) Claria Dice (Spouse)    [phone]     PCP: Charlotte Crumb  Action/Plan: Transition to home with home health services to follow. Per PT's recommendation: Home health PT;Supervision/Assistance - 24 hour. Pt states husband will provide the 24 hr supervision needed.   Pt states has transportation to home.  Expected Discharge Date:                  Expected Discharge Plan:  [place]  In-House Referral:     Discharge planning Services  CM Consult  Post Acute Care Choice:    Choice offered to:  Patient  DME Arranged:   N/A DME Agency:   N/A  HH Arranged:  PT [place] Agency:  [place], pending MD's order. NCM has requested order from MD.  Status of Service:  Completed, signed off  If discussed at [place] of Stay Meetings, dates discussed:    Additional Comments:  Sharin Mons, RN 01/05/2018, 3:20 PM

## 2018-01-05 NOTE — Progress Notes (Signed)
PROGRESS NOTE  Veena Sturgess [id number] DOB: [birth year] DOA: 01/01/2018 PCP: Elenore Paddy, FNP  Brief Narrative: 57 year old woman PMH stroke with right-sided weakness, chronic pain on opioid therapy, depression, anxiety, alcohol abuse 3 beers daily, presented to urgent care with several day history of progressive confusion, sleepiness, impaired memory, tremulousness.  Sent to emergency department, found to have acute kidney injury with creatinine 5.7, potassium 6.9.    Assessment/Plan AKI with acute metabolic acidosis on admission, with hyperkalemia on admission with peak T waves.  Thought secondary to hypotension, dehydration.  Complicated by ACE inhibitor use.  Renal ultrasound no hydronephrosis.  Possible medical renal disease.  Treated with bicarbonate infusion per nephrology. --Basic metabolic panel reviewed.  Creatinine continues to trend downwards, nephrology has signed off. --Expect spontaneous resolution at this point  Acute toxic metabolic encephalopathy, thought multifactorial, AKI, gabapentin, narcotics in the setting of acute kidney injury, acute alcohol withdrawal.  CT head no acute abnormalities. --Appears better today but still confused, she is concerned about doing something illegal and wants to make sure she will not get in trouble.  At times speech content is appropriate.    Alcohol abuse, acute alcohol withdrawal --Appears to be resolved.  Now Ativan since 4/16 at 1252.  PMH stroke with chronic right-sided deficits. --Continue statin, aspirin  Chronic back pain --Appears stable.  Resume chronic pain medication.  Current smoker --Recommend cessation  I have not yet been able to get in touch with the husband other number provided, home or cell.  I wonder what baseline is, I suspect she is close to baseline and may have a component of chronic confabulation.  Review of psychiatric note 4/4 notable for history of memory impairment, thought content rumination,  immediate memory characterized as fair.  She does appear to be improving and I would anticipate discharge 4/19.   DVT prophylaxis: heparin Code Status: full Family Communication: none Disposition Plan: home home health physical therapy   Murray Hodgkins, MD  [facility] Direct contact: [phone] --Via [place]  --[email or web address]; password [email or web address]  7PM-7AM contact night coverage as above 01/05/2018, 3:06 PM  LOS: 4 days   Consultants:  Nephrology  Procedures:    Antimicrobials:    Interval history/Subjective: No complaints today.  Feeling well.  Objective: Vitals:  Vitals:   01/05/18 1140 01/05/18 1426  BP: (!) 157/98 133/71  Pulse: 84 83  Resp:  16  Temp:  98 F (36.7 C)  SpO2:  98%    Exam:  Constitutional:   . Appears calm and comfortable Respiratory:  . CTA bilaterally, no w/r/r.  . Respiratory effort normal Cardiovascular:  . RRR, no m/r/g . No LE extremity edema   Musculoskeletal:  . Stands without difficulty.  Slightly unsteady on feet. Psychiatric:  . Mental status o Mood appears grossly normal, affect somewhat odd o Oriented to self, hospital, not month a year . judgement and insight appear impaired Appears confused.  Perseverates.    I have personally reviewed the following:  Filed Weights   01/03/18 0500 01/04/18 0426 01/05/18 0515  Weight: 57.7 kg (127 lb 3.3 oz) 60.1 kg (132 lb 7.9 oz) 56.8 kg (125 lb 3.5 oz)   Weight change: -3.3 kg (-7 lb 4.4 oz)  UOP: 1700 I/O since admission: -1.46 L  Labs:  Creatinine trending down, 5.05 >> 4.09 >> 2.96. >>  2.38  Scheduled Meds: . aspirin EC  81 mg Oral Daily  . atorvastatin  40 mg Oral Daily  . cloNIDine  0.1 mg Oral TID  .  folic acid  1 mg Oral Daily  . heparin  5,000 Units Subcutaneous Q8H  . hydrOXYzine  50 mg Oral QHS  . multivitamin with minerals  1 tablet Oral Daily  . oxyCODONE  10 mg Oral 5 X Daily  . thiamine  100 mg Oral Daily   Continuous  Infusions:   Principal Problem:   AKI (acute kidney injury) ([place]) Active Problems:   ETOH abuse   Toxic metabolic encephalopathy   Alcohol withdrawal ([place])   LOS: 4 days

## 2018-01-06 LAB — CULTURE, BLOOD (ROUTINE X 2)
Culture: NO GROWTH
Culture: NO GROWTH
SPECIAL REQUESTS: ADEQUATE
Special Requests: ADEQUATE

## 2018-01-06 LAB — BASIC METABOLIC PANEL
Anion gap: 12 (ref 5–15)
BUN: 17 mg/dL (ref 6–20)
CALCIUM: 9 mg/dL (ref 8.9–10.3)
CO2: 24 mmol/L (ref 22–32)
CREATININE: 2.07 mg/dL — AB (ref 0.44–1.00)
Chloride: 106 mmol/L (ref 101–111)
GFR calc Af Amer: 30 mL/min — ABNORMAL LOW (ref >60)
GFR, EST NON AFRICAN AMERICAN: 26 mL/min — AB (ref >60)
GLUCOSE: 90 mg/dL (ref 65–99)
Potassium: 3.6 mmol/L (ref 3.5–5.1)
Sodium: 142 mmol/L (ref 135–145)

## 2018-01-06 MED ORDER — HYDRALAZINE HCL 20 MG/ML IJ SOLN
10.0000 mg | Freq: Four times a day (QID) | INTRAMUSCULAR | Status: DC | PRN
  Administered 2018-01-06: 10 mg via INTRAVENOUS
  Filled 2018-01-06: qty 1

## 2018-01-06 MED ORDER — FOLIC ACID 1 MG PO TABS: 1.0000 mg | ORAL_TABLET | Freq: Every day | ORAL | Status: DC

## 2018-01-06 MED ORDER — ADULT MULTIVITAMIN W/MINERALS CH: 1.0000 | ORAL_TABLET | Freq: Every day | ORAL | Status: AC

## 2018-01-06 MED ORDER — THIAMINE HCL 100 MG PO TABS: 100.0000 mg | ORAL_TABLET | Freq: Every day | ORAL | Status: DC

## 2018-01-06 MED ORDER — GABAPENTIN 600 MG PO TABS: 600.0000 mg | ORAL_TABLET | Freq: Every day | ORAL | Status: DC

## 2018-01-06 NOTE — Discharge Summary (Signed)
Physician Discharge Summary  Kelsey Wade OYD:[id number] DOB: 04-21-1961 DOA: 01/01/2018  PCP: Elenore Paddy, FNP  Admit date: 01/01/2018 Discharge date: 01/06/2018  Recommendations for Outpatient Follow-up:   AKI  --Temporarily holding ACE inhibitor until follow-up with PCP.  Consider repeat basic metabolic panel in the outpatient setting.  Also decreased gabapentin to 300 mg daily until renal function improves.  Alcohol abuse, acute alcohol withdrawal  Current smoker --Recommend cessation  Follow-up Information    [facility] Follow up.   Specialty:  [place] Why:  home health services arranged Contact information: [place] [address] [phone]        Elenore Paddy, [place]. Schedule an appointment as soon as possible for a visit in 1 week(s).   Specialty:  Family Medicine Contact information: [place] [address] [phone]            Discharge Diagnoses:  AKI with acute metabolic acidosis on admission, with hyperkalemia on admission with peak T waves.  Thought secondary to hypotension, dehydration.  Complicated by ACE inhibitor use.  Renal ultrasound no hydronephrosis.  Possible medical renal disease.  Treated with bicarbonate infusion per nephrology. --Creatinine continues to trend downwards.  Expect spontaneous resolution.  Follow-up with PCP as an outpatient  Acute toxic metabolic encephalopathy, thought multifactorial, AKI, gabapentin, narcotics in the setting of acute kidney injury, acute alcohol withdrawal.  CT head no acute abnormalities. --Appears resolved.  Alcohol abuse, acute alcohol withdrawal --Resolved.  Now Ativan since 4/16 at 1252.  PMH stroke with chronic right-sided deficits. --Continue statin, aspirin  Chronic back pain --Continue chronic pain medication.  Nursing note from yesterday noted.  Current smoker --Recommend cessation  Discharge  Condition: improved Disposition: home  Diet recommendation: low sodium, heart healthy  Filed Weights   01/03/18 0500 01/04/18 0426 01/05/18 0515  Weight: 57.7 kg (127 lb 3.3 oz) 60.1 kg (132 lb 7.9 oz) 56.8 kg (125 lb 3.5 oz)    History of present illness:  57 year old woman PMH stroke with right-sided weakness, chronic pain on opioid therapy, depression, anxiety, alcohol abuse 3 beers daily, presented to urgent care with several day history of progressive confusion, sleepiness, impaired memory, tremulousness.  Sent to emergency department, found to have acute kidney injury with creatinine 5.7, potassium 6.9.   Hospital Course:  Patient was seen by nephrology and treated for acute kidney injury with gradual clinical improvement.  At the time of discharge, renal function continues to improve.  Nephrology has signed off.  Will hold ACE inhibitor until follow-up with PCP.  Encephalopathy also appears to have resolved with supportive care.  I have decreased her gabapentin given her renal function until follow-up with her PCP.  Patient clearly understood both these recommendations.  AKI with acute metabolic acidosis on admission, with hyperkalemia on admission with peak T waves.  Thought secondary to hypotension, dehydration.  Complicated by ACE inhibitor use.  Renal ultrasound no hydronephrosis.  Possible medical renal disease.  Treated with bicarbonate infusion per nephrology. --Creatinine continues to trend downwards.  Expect spontaneous resolution.  Follow-up with PCP as an outpatient  Acute toxic metabolic encephalopathy, thought multifactorial, AKI, gabapentin, narcotics in the setting of acute kidney injury, acute alcohol withdrawal.  CT head no acute abnormalities. --Appears resolved.  Alcohol abuse, acute alcohol withdrawal --Resolved.  Now Ativan since 4/16 at 1252.  PMH stroke with chronic right-sided deficits. --Continue statin, aspirin  Chronic back pain --Continue chronic  pain medication.  Nursing note from yesterday noted.  Current smoker --Recommend cessation  Consultants:  Nephrology  Today's assessment: S: Feels well, no complaints. O: Vitals:  Vitals:   01/06/18 0602 01/06/18 0626  BP: (!) 180/102 (!) 188/102  Pulse: 76   Resp: 20   Temp: 98.1 F (36.7 C)   SpO2: 97%     Constitutional:  . Appears calm and comfortable Respiratory:  . CTA bilaterally, no w/r/r.  . Respiratory effort normal Cardiovascular:  . RRR, no m/r/g .  Psychiatric:  . judgement and insight appear intact today.  She spontaneously reported back to me medication instructions.  She seems quite engaged, awake and alert. . Mental status o Mood, affect appropriate  Discharge Instructions  Discharge Instructions    Diet - low sodium heart healthy   Complete by:  As directed    Discharge instructions   Complete by:  As directed    Call your physician or seek immediate medical attention for confusion, falls, sleepiness, or worsening of condition. Please do not drink alcohol.   Increase activity slowly   Complete by:  As directed      Allergies as of 01/06/2018      Reactions   Lamictal [lamotrigine] Other (See Comments)   Causing dizziness, hurting all over the body and confusion.      Medication List    STOP taking these medications   lisinopril 10 MG tablet Commonly known as:  PRINIVIL,ZESTRIL     TAKE these medications   aspirin 325 MG tablet Take 325 mg by mouth daily.   atorvastatin 40 MG tablet Commonly known as:  LIPITOR Take 1 tablet (40 mg total) by mouth daily.   famotidine 40 MG tablet Commonly known as:  PEPCID Take 1 tablet (40 mg total) by mouth at bedtime.   folic acid 1 MG tablet Commonly known as:  FOLVITE Take 1 tablet (1 mg total) by mouth daily. Start taking on:  01/07/2018   gabapentin 600 MG tablet Commonly known as:  NEURONTIN Take 1 tablet (600 mg total) by mouth daily. What changed:  how much to take     hydrOXYzine 50 MG capsule Commonly known as:  VISTARIL Take one to two capsule daily t bed time for insomnia What changed:    how much to take  how to take this  when to take this  additional instructions   multivitamin with minerals Tabs tablet Take 1 tablet by mouth daily. Start taking on:  01/07/2018   Oxycodone HCl 10 MG Tabs Take 10 mg by mouth 5 (five) times daily.   PROAIR HFA 108 (90 Base) MCG/ACT inhaler Generic drug:  albuterol Inhale 2 puffs into the lungs every 6 (six) hours as needed for wheezing or shortness of breath.   thiamine 100 MG tablet Take 1 tablet (100 mg total) by mouth daily. Start taking on:  01/07/2018   venlafaxine XR 150 MG 24 hr capsule Commonly known as:  EFFEXOR XR Take 1 capsule (150 mg total) by mouth daily with breakfast.      Allergies  Allergen Reactions  . Lamictal [Lamotrigine] Other (See Comments)    Causing dizziness, hurting all over the body and confusion.    The results of significant diagnostics from this hospitalization (including imaging, microbiology, ancillary and laboratory) are listed below for reference.    Significant Diagnostic Studies: Ct Head Wo Contrast  Result Date: 01/01/2018 CLINICAL DATA:  Altered level of consciousness, LEFT side weakness, history of stroke with RIGHT-sided deficits, hypertension, smoker EXAM: CT HEAD WITHOUT CONTRAST TECHNIQUE:  Contiguous axial images were obtained from the base of the skull through the vertex without intravenous contrast. COMPARISON:  11/30/2017 FINDINGS: Brain: Normal ventricular morphology. No midline shift or mass effect. Old infarcts in LEFT frontal and parietal lobes extending to the LEFT occipital lobe. Small old RIGHT frontal and parietal infarcts. Probable tiny old infarct at RIGHT cerebellum. No intracranial hemorrhage, mass lesion or evidence of acute infarction. No extra-axial fluid collections. Vascular: Atherosclerotic calcifications at the internal carotid  arteries bilaterally at the skull base Skull: Unremarkable Sinuses/Orbits: Clear Other: N/A IMPRESSION: Old BILATERAL infarcts. No acute intracranial abnormalities. Electronically Signed   By: Lavonia Dana M.D.   On: 01/01/2018 19:05   US Renal  Result Date: 01/02/2018 CLINICAL DATA:  Acute kidney injury. EXAM: RENAL / URINARY TRACT ULTRASOUND COMPLETE COMPARISON:  CT abdomen and pelvis 09/16/2017 FINDINGS: Right Kidney: Length: 12.3 cm, within normal limits. Renal parenchyma is isoechoic to the index organ, the liver. No mass or hydronephrosis visualized. Left Kidney: Length: 11.2 cm, within normal limits. Renal parenchyma is isoechoic to the index organ, the spleen. No mass or hydronephrosis visualized. Bladder: The urinary bladder is collapsed around a Foley catheter. IMPRESSION: 1. Increased parenchymal echogenicity bilaterally. This is nonspecific, but can be seen in medical renal disease. 2. No focal mass lesion or hydronephrosis. 3. Foley catheter in situ. Electronically Signed   By: San Morelle M.D.   On: 01/02/2018 07:23   Dg Chest Portable 1 View  Result Date: 01/01/2018 CLINICAL DATA:  Altered EXAM: PORTABLE CHEST 1 VIEW COMPARISON:  11/30/2017, 10/01/2017, 03/02/2017 FINDINGS: Mild cardiomegaly with vascular congestion. Mild diffuse interstitial opacities suggesting minimal edema. Mild bibasilar airspace disease. No pneumothorax. IMPRESSION: 1. Hazy bibasilar airspace disease, atelectasis versus pneumonia 2. Mild cardiomegaly with vascular congestion and mild diffuse interstitial opacities suspect for mild interstitial edema Electronically Signed   By: Donavan Foil M.D.   On: 01/01/2018 18:59    Microbiology: Recent Results (from the past 240 hour(s))  Blood culture (routine x 2)     Status: None (Preliminary result)   Collection Time: 01/01/18  8:25 PM  Result Value Ref Range Status   Specimen Description BLOOD LEFT ARM  Final   Special Requests   Final    BOTTLES DRAWN  AEROBIC AND ANAEROBIC Blood Culture adequate volume   Culture   Final    NO GROWTH 4 DAYS Performed at [facility], [address], [place], [place] [address]    Report Status PENDING  Incomplete  Blood culture (routine x 2)     Status: None (Preliminary result)   Collection Time: 01/01/18  9:14 PM  Result Value Ref Range Status   Specimen Description BLOOD LEFT HAND  Final   Special Requests   Final    BOTTLES DRAWN AEROBIC AND ANAEROBIC Blood Culture adequate volume   Culture   Final    NO GROWTH 4 DAYS Performed at [facility], [place] [address] [place], [place] [address]    Report Status PENDING  Incomplete  Urine culture     Status: None   Collection Time: 01/02/18  6:00 AM  Result Value Ref Range Status   Specimen Description URINE, CATHETERIZED  Final   Special Requests NONE  Final   Culture   Final    NO GROWTH Performed at [facility], [place] [address] [place], [place] [address]    Report Status 01/03/2018 FINAL  Final  MRSA PCR Screening     Status: None  Collection Time: 01/03/18  6:00 AM  Result Value Ref Range Status   MRSA by PCR NEGATIVE NEGATIVE Final    Comment:        The GeneXpert MRSA Assay (FDA approved for NASAL specimens only), is one component of a comprehensive MRSA colonization surveillance program. It is not intended to diagnose MRSA infection nor to guide or monitor treatment for MRSA infections. Performed at [facility], [place] [address], [place], [place] [address]      Labs: Basic Metabolic Panel: Recent Labs  Lab 01/02/18 0308 01/03/18 0344 01/04/18 0634 01/05/18 0937 01/06/18 0809  NA 138 144 142 140 142  K 5.4* 4.4 3.5 3.5 3.6  CL 108 101 104 103 106  CO2 21* 30 28 26 24   GLUCOSE 153* 115* 91 139* 90  BUN 58* 43* 30* 21* 17  CREATININE 5.05* 4.09* 2.96* 2.38* 2.07*  CALCIUM 8.1* 8.6* 8.4* 8.9 9.0  MG  --   --  1.7  --   --   PHOS  --   --  4.4  --   --    Liver Function Tests: Recent Labs  Lab  01/01/18 1815 01/03/18 0344  AST 10* 18  ALT 7* 8*  ALKPHOS 91 78  BILITOT 0.4 0.5  PROT 7.3 6.2*  ALBUMIN 3.7 3.1*    Recent Labs  Lab 01/01/18 1815  AMMONIA 32   CBC: Recent Labs  Lab 01/01/18 1815 01/02/18 0308 01/03/18 0344  WBC 9.0 8.6 8.8  NEUTROABS 5.2  --   --   HGB 14.5 11.9* 14.0  HCT 44.9 38.4 43.5  MCV 90.5 91.4 88.6  PLT 268 250 238   Cardiac Enzymes: Recent Labs  Lab 01/02/18 0607  TROPONINI <0.03    Recent Labs    01/01/18 2049  BNP 97.4   CBG: Recent Labs  Lab 01/01/18 2118 01/01/18 2211 01/02/18 0417 01/02/18 0741 01/02/18 1318  GLUCAP 40* 151* 136* 122* 80    Principal Problem:   AKI (acute kidney injury) ([place]) Active Problems:   ETOH abuse   Toxic metabolic encephalopathy   Alcohol withdrawal ([place])   Time coordinating discharge: 35 minutes  Signed:  Murray Hodgkins, MD [facility] 01/06/2018, 2:48 PM

## 2018-01-16 ENCOUNTER — Ambulatory Visit (HOSPITAL_BASED_OUTPATIENT_CLINIC_OR_DEPARTMENT_OTHER): Payer: Medicaid Other | Admitting: Physical Medicine & Rehabilitation

## 2018-01-16 ENCOUNTER — Encounter: Payer: Self-pay | Admitting: Physical Medicine & Rehabilitation

## 2018-01-16 VITALS — BP 145/90 | HR 89 | Resp 14 | Ht 64.0 in | Wt 125.0 lb

## 2018-01-16 DIAGNOSIS — G8929 Other chronic pain: Secondary | ICD-10-CM | POA: Diagnosis not present

## 2018-01-16 DIAGNOSIS — R252 Cramp and spasm: Secondary | ICD-10-CM | POA: Diagnosis not present

## 2018-01-16 DIAGNOSIS — G8110 Spastic hemiplegia affecting unspecified side: Secondary | ICD-10-CM

## 2018-01-16 DIAGNOSIS — M79641 Pain in right hand: Secondary | ICD-10-CM | POA: Diagnosis not present

## 2018-01-16 DIAGNOSIS — I69354 Hemiplegia and hemiparesis following cerebral infarction affecting left non-dominant side: Secondary | ICD-10-CM | POA: Diagnosis present

## 2018-01-16 NOTE — Progress Notes (Signed)
Subjective:    Patient ID: Kelsey Wade, female    DOB: [birth year], 57 y.o.   MRN: [id number] 56yo female with hx of CVA causing RUE.RLE weakness She was admitted to [facility] with subacute onset of right-sided weakness on 03/02/2017.  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  2D echo showed no evidence of cardiac embolic source, a statin was prescribed, dual antiplatelet therapy was recommended with Plavix and baby aspirin. HPI Patient returns today with complaints of right hand stiffness.  Interval medical history positive for emergency department visit resulting in hospitalization for hyperkalemia.  Patient continues to follow-up with pain clinic in regards to upper extremity pain.  Patient underwent botulinum toxin injection Botox on 11/11/2017. Right biceps 100 units Right flexor carpi ulnaris 50 units Right flexor carpi radialis 50 units Right flexor digitorum superficialis 50 units Right flexor digitorum profundus 50 units  Patient states that she had right hand swelling after the injection.4 days post injection.  None of the injection sites was in the hand.  Went to her primary care physician was told that it was an allergic reaction.  We discussed that allergy to Botox is extremely rare and she does not have any allergy to albumin.   pain Inventory Average Pain 9 Pain Right Now 9 My pain is sharp, stabbing and aching  In the last 24 hours, has pain interfered with the following? General activity 9 Relation with others 8 Enjoyment of life 10 What TIME of day is your pain at its worst? all Sleep (in general) Poor  Pain is worse with: walking, bending, sitting and standing Pain improves with: medication Relief from Meds: 5  Mobility walk with assistance use a walker how many minutes can you walk? 10 ability to climb steps?  no do you drive?   no needs help with transfers Do you have any goals in this area?  yes  Function disabled: date disabled . I need assistance with the following:  dressing, bathing, meal prep, household duties and shopping  Neuro/Psych trouble walking spasms dizziness confusion depression anxiety  Prior Studies Any changes since last visit?  yes  Physicians involved in your care Any changes since last visit?  no   Family History  Problem Relation Age of Onset  . Alcohol abuse Father   . Alcohol abuse Brother   . Breast cancer Maternal Aunt   . Colon cancer Neg Hx   . Esophageal cancer Neg Hx   . Stomach cancer Neg Hx   . Rectal cancer Neg Hx    Social History   Socioeconomic History  . Marital status: Divorced    Spouse name: Not on file  . Number of children: 2  . Years of education: Not on file  . Highest education level: 9th grade  Occupational History  . Occupation: disabled  Social Needs  . Financial resource strain: Not on file  . Food insecurity:    Worry: Not on file    Inability: Not on file  . Transportation needs:    Medical: Not on file    Non-medical: Not on file  Tobacco Use  . Smoking status: Current Every Day Smoker    Packs/day: 0.50    Types: Cigarettes  . Smokeless tobacco: Never Used  . Tobacco comment: less  Substance and Sexual Activity  . Alcohol use: Yes    Comment: Occasionally   .  Drug use: Yes    Types: Marijuana    Comment: quit 2 months ago 10-20-17  . Sexual activity: Not on file  Lifestyle  . Physical activity:    Days per week: Not on file    Minutes per session: Not on file  . Stress: Not on file  Relationships  . Social connections:    Talks on phone: Not on file    Gets together: Not on file    Attends religious service: Not on file    Active member of club or organization: Not on file    Attends meetings of clubs or organizations: Not on file    Relationship status: Not on file  Other Topics Concern  . Not on file  Social  History Narrative   Divorced, lives in a 1 story home, with 2 steps to enter. Drinks 2 cups of coffee, and one Mtn. Dew a day. Prior to becoming disabled by CVA, patient was employed in house keeping.    Past Surgical History:  Procedure Laterality Date  . APPENDECTOMY     Past Medical History:  Diagnosis Date  . Anxiety   . Arthritis   . Chronic back pain   . Depression   . ETOH abuse   . GERD (gastroesophageal reflux disease)   . HTN (hypertension)   . Hyperlipidemia   . Stroke [facility])    June 2018  . Tobacco abuse    BP (!) 145/90 (BP Location: Left Arm, Patient Position: Sitting, Cuff Size: Normal)   Pulse 89   Resp 14   Ht 5\' 4"  (1.626 m)   Wt 125 lb (56.7 kg)   SpO2 97%   BMI 21.46 kg/m   Opioid Risk Score:   Fall Risk Score:  `1  Depression screen PHQ 2/9  Depression screen [facility] 2/9 08/10/2017 06/10/2017 06/10/2017 04/15/2017 03/15/2017 02/17/2017 01/18/2017  Decreased Interest 0 2 0 0 1 1 1   Down, Depressed, Hopeless 1 3 3 3 1 1 1   PHQ - 2 Score 1 5 3 3 2 2 2   Altered sleeping - 3 3 3  - - 1  Tired, decreased energy - 3 3 3  - - 1  Change in appetite - - 0 0 - - 1  Feeling bad or failure about yourself  - 3 3 3  - - 1  Trouble concentrating - 3 3 3  - - 1  Moving slowly or fidgety/restless - 3 3 3  - - 1  Suicidal thoughts - - 0 0 - - 0  PHQ-9 Score - 20 18 18  - - 8  Difficult doing work/chores - - - Not difficult at all - - -    Review of Systems  Constitutional: Positive for diaphoresis.  Respiratory: Positive for cough and shortness of breath.   Gastrointestinal: Negative.   Genitourinary: Negative.   Musculoskeletal: Positive for arthralgias, back pain, gait problem, myalgias, neck pain and neck stiffness.       Spasms  Skin: Negative.   Allergic/Immunologic: Negative.   Neurological: Positive for dizziness.  Hematological: Bruises/bleeds easily.  Psychiatric/Behavioral: Positive for confusion and dysphoric mood. The patient is nervous/anxious.   All other  systems reviewed and are negative.      Objective:   Physical Exam  Constitutional: She is oriented to person, place, and time. She appears well-developed and well-nourished. No distress.  HENT:  Head: Normocephalic.  Eyes: Pupils are equal, round, and reactive to light. EOM are normal.  Neurological: She is alert and oriented to person,  place, and time.  Motor strength is 3- at the right deltoid 3- at biceps and triceps trace finger flexors 0 finger extensors 0 wrist flexion extension  Skin: She is not diaphoretic.  Psychiatric: She has a normal mood and affect.    MAS 3 right finger flexors and left greater than FDP MAS 2 at the wrist flexor on the right MAS 2 at the elbow flexor on the right MAS 3 in the lumbricals of the right hand No pain with wrist or hand range of motion      Assessment & Plan:  1.  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 She has decreased pain with range of motion since the Botox injections still has significant spasticity.  She is approximately 2 months post injection. We discussed that I am not convinced that this was a allergic reaction primarily because I would expect the injection sites to be the areas of swelling rather than the hand which was not injected.  We discussed treatment options including trial of botulinum toxin type B, Myobloc in place of botulinum toxin type A She stated she will think about this and contact our office to schedule if she decides to go this route.  We discussed that medication management for spasticity that is focal usually is not as effective as injections.  Also side effects of drowsiness typically in combination with the other medications that she is on for her pain and anxiety  Follow-up with primary care Follow-up with neurology

## 2018-01-16 NOTE — Patient Instructions (Signed)
RimabotulinumtoxinB injection What is this medicine? RIMABOTULINUMTOXINB (rim a BOTT you lye num bee) is a neuro-muscular blocker. It is used to treat severe neck muscle spasms. This medicine may be used for other purposes; ask your health care provider or pharmacist if you have questions. COMMON BRAND NAME(S): Myobloc What should I tell my health care provider before I take this medicine? They need to know if you have any of these conditions: -breathing problems -cerebral palsy spasms -history of surgery where this medicine is going to be used -infection where this medicine is going to be used -myasthenia gravis or other neurologic disease -nerve or muscle disease -surgery plans -an unusual or allergic reaction to botulinum toxin, albumin, other medicines, foods, dyes, or preservatives -pregnant or trying to get pregnant -breast-feeding How should I use this medicine? This medicine is for injection into a muscle. It is given by a health care professional in a hospital or clinic setting. Talk to your pediatrician regarding the use of this medicine in children. Special care may be needed. Overdosage: If you think you have taken too much of this medicine contact a poison control center or emergency room at once. NOTE: This medicine is only for you. Do not share this medicine with others. What if I miss a dose? This does not apply. What may interact with this medicine? -aminoglycoside antibiotics like gentamicin, neomycin, tobramycin -muscle relaxants -other botulinum toxin injections This list may not describe all possible interactions. Give your health care provider a list of all the medicines, herbs, non-prescription drugs, or dietary supplements you use. Also tell them if you smoke, drink alcohol, or use illegal drugs. Some items may interact with your medicine. What should I watch for while using this medicine? Visit your doctor for regular check ups. This medicine will cause  weakness in the muscle where it is injected. Tell your doctor if you feel unusually weak in other muscles. Get medical help right away if you have problems with breathing, swallowing, or talking. This medicine contains albumin from human blood. It may be possible to pass an infection in this medicine but no cases have been reported. Talk to your doctor about the risks and benefits of this medicine. If your activities have been limited by your condition, go back to your regular routine slowly after treatment with this medicine. This medicine can make your muscles weak. And, this medicine can make your eyelids droop or make you see blurry or double. If you have weak muscles or trouble seeing do not drive a car, use machinery, or do other dangerous activities. What side effects may I notice from receiving this medicine? Side effects that you should report to your doctor or health care professional as soon as possible: -allergic reactions like skin rash, itching or hives, swelling of the face, lips, or tongue -breathing problems -chest pain or tightness -infection -numbness -speech problems -swallowing problems Side effects that usually do not require medical attention (report to your doctor or health care professional if they continue or are bothersome): -bruising or pain at site where injected -dry eyes, mouth -headache -muscle pain -nausea, stomach upset This list may not describe all possible side effects. Call your doctor for medical advice about side effects. You may report side effects to FDA at 1-800-FDA-1088. Where should I keep my medicine? This drug is given in a hospital or clinic and will not be stored at home. NOTE: This sheet is a summary. It may not cover all possible information. If you have questions  about this medicine, talk to your doctor, pharmacist, or health care provider.  2018 Elsevier/Gold Standard (2012-07-28 16:20:05)

## 2018-01-25 ENCOUNTER — Other Ambulatory Visit: Payer: Self-pay | Admitting: Family Medicine

## 2018-01-30 ENCOUNTER — Ambulatory Visit: Payer: Self-pay | Admitting: Diagnostic Neuroimaging

## 2018-01-30 ENCOUNTER — Encounter: Payer: Self-pay | Admitting: Diagnostic Neuroimaging

## 2018-02-21 ENCOUNTER — Ambulatory Visit (HOSPITAL_COMMUNITY): Payer: Self-pay | Admitting: Psychiatry

## 2018-02-28 ENCOUNTER — Ambulatory Visit (HOSPITAL_COMMUNITY): Payer: Medicaid Other | Admitting: Psychiatry

## 2018-03-06 ENCOUNTER — Other Ambulatory Visit (HOSPITAL_COMMUNITY): Payer: Self-pay | Admitting: Psychiatry

## 2018-03-06 DIAGNOSIS — F411 Generalized anxiety disorder: Secondary | ICD-10-CM

## 2018-03-09 ENCOUNTER — Ambulatory Visit (INDEPENDENT_AMBULATORY_CARE_PROVIDER_SITE_OTHER): Payer: Medicaid Other | Admitting: Psychiatry

## 2018-03-09 ENCOUNTER — Encounter (HOSPITAL_COMMUNITY): Payer: Self-pay | Admitting: Psychiatry

## 2018-03-09 ENCOUNTER — Ambulatory Visit: Payer: Self-pay | Admitting: Physical Medicine & Rehabilitation

## 2018-03-09 DIAGNOSIS — F411 Generalized anxiety disorder: Secondary | ICD-10-CM

## 2018-03-09 MED ORDER — HYDROXYZINE PAMOATE 50 MG PO CAPS: ORAL_CAPSULE | ORAL | 1 refills | Status: DC

## 2018-03-09 MED ORDER — VENLAFAXINE HCL ER 150 MG PO CP24: 150.0000 mg | ORAL_CAPSULE | Freq: Every day | ORAL | 1 refills | Status: DC

## 2018-03-09 NOTE — Progress Notes (Signed)
[place] MD/PA/NP OP Progress Note  03/09/2018 3:00 PM Kelsey Wade  MRN:  [id number]  Chief Complaint: I am doing better with increase Effexor.  HPI: Kelsey Wade came for her follow-up appointment.  On her last visit we increase Effexor 250 mg.  She is feeling better.  She is less anxious and less depressed.  She has chronic pain and memory problem.  She did not see neurology which was recommended on her last visit.  She like Vistaril which is helping her sleep and anxiety.  Patient has multiple health issues.  Patient denies any agitation, anger, mania, psychosis.  She was admitted on the medical floor 6 weeks ago because of acute kidney failure and dehydration.  Her creatinine was very high which was slowly reduced but remains above 2.  After that she has seen her primary care physician at [facility] and she has at work and she was told her kidneys are much better now.  Patient denies any feeling of hopelessness or worthlessness.  She has no tremors or shakes.  She likes to continue Effexor and Vistaril.  Patient denies drinking or using any illegal substances.  Visit Diagnosis:    ICD-10-CM   1. GAD (generalized anxiety disorder) F41.1 venlafaxine XR (EFFEXOR-XR) 150 MG 24 hr capsule    hydrOXYzine (VISTARIL) 50 MG capsule    Past Psychiatric History: Reviewed Patient denies any history of psychiatric inpatient treatment or any suicidal time. She had a history of irritability, anger, mood swings, depression symptoms started to get worse after the stroke in 2018. She had tried Cymbalta, Zoloft, Celexa, trazodone, amitriptyline, BuSpar, Wellbutrin with limited effect. We tried Lamictal but she had side effects including dizziness, hurting all over the body and confusion.  Past Medical History:  Past Medical History:  Diagnosis Date  . Anxiety   . Arthritis   . Chronic back pain   . Depression   . ETOH abuse   . GERD (gastroesophageal reflux disease)   . HTN (hypertension)   .  Hyperlipidemia   . Stroke [facility])    June 2018  . Tobacco abuse     Past Surgical History:  Procedure Laterality Date  . APPENDECTOMY      Family Psychiatric History: Reviewed.  Family History:  Family History  Problem Relation Age of Onset  . Alcohol abuse Father   . Alcohol abuse Brother   . Breast cancer Maternal Aunt   . Colon cancer Neg Hx   . Esophageal cancer Neg Hx   . Stomach cancer Neg Hx   . Rectal cancer Neg Hx     Social History:  Social History   Socioeconomic History  . Marital status: Divorced    Spouse name: Not on file  . Number of children: 2  . Years of education: Not on file  . Highest education level: 9th grade  Occupational History  . Occupation: disabled  Social Needs  . Financial resource strain: Not on file  . Food insecurity:    Worry: Not on file    Inability: Not on file  . Transportation needs:    Medical: Not on file    Non-medical: Not on file  Tobacco Use  . Smoking status: Current Every Day Smoker    Packs/day: 0.50    Types: Cigarettes  . Smokeless tobacco: Never Used  . Tobacco comment: less  Substance and Sexual Activity  . Alcohol use: Yes    Comment: Occasionally   . Drug use: Yes    Types: Marijuana  Comment: quit 2 months ago 10-20-17  . Sexual activity: Not on file  Lifestyle  . Physical activity:    Days per week: Not on file    Minutes per session: Not on file  . Stress: Not on file  Relationships  . Social connections:    Talks on phone: Not on file    Gets together: Not on file    Attends religious service: Not on file    Active member of club or organization: Not on file    Attends meetings of clubs or organizations: Not on file    Relationship status: Not on file  Other Topics Concern  . Not on file  Social History Narrative   Divorced, lives in a 1 story home, with 2 steps to enter. Drinks 2 cups of coffee, and one Mtn. Dew a day. Prior to becoming disabled by CVA, patient was employed in house  keeping.     Allergies: No Active Allergies  Metabolic Disorder Labs: Recent Results (from the past 2160 hour(s))  CBG monitoring, ED     Status: None   Collection Time: 01/01/18  6:02 PM  Result Value Ref Range   Glucose-Capillary 82 65 - 99 mg/dL  Comprehensive metabolic panel     Status: Abnormal   Collection Time: 01/01/18  6:15 PM  Result Value Ref Range   Sodium 140 135 - 145 mmol/L   Potassium 6.9 (HH) 3.5 - 5.1 mmol/L    Comment: NO VISIBLE HEMOLYSIS CRITICAL RESULT CALLED TO, READ BACK BY AND VERIFIED WITH: Dagoberto Reef, RN 1907 01/01/2018 THOMPSON V    Chloride 110 101 - 111 mmol/L   CO2 19 (L) 22 - 32 mmol/L   Glucose, Bld 85 65 - 99 mg/dL   BUN 63 (H) 6 - 20 mg/dL   Creatinine, Ser 5.73 (H) 0.44 - 1.00 mg/dL   Calcium 8.9 8.9 - 10.3 mg/dL   Total Protein 7.3 6.5 - 8.1 g/dL   Albumin 3.7 3.5 - 5.0 g/dL   AST 10 (L) 15 - 41 U/L   ALT 7 (L) 14 - 54 U/L   Alkaline Phosphatase 91 38 - 126 U/L   Total Bilirubin 0.4 0.3 - 1.2 mg/dL   GFR calc non Af Amer 8 (L) >60 mL/min   GFR calc Af Amer 9 (L) >60 mL/min    Comment: (NOTE) The eGFR has been calculated using the CKD EPI equation. This calculation has not been validated in all clinical situations. eGFR's persistently <60 mL/min signify possible Chronic Kidney Disease.    Anion gap 11 5 - 15    Comment: Performed at [facility] [address] [place], [place] [address]  CBC WITH DIFFERENTIAL     Status: None   Collection Time: 01/01/18  6:15 PM  Result Value Ref Range   WBC 9.0 4.0 - 10.5 K/uL   RBC 4.96 3.87 - 5.11 MIL/uL   Hemoglobin 14.5 12.0 - 15.0 g/dL   HCT 44.9 36.0 - 46.0 %   MCV 90.5 78.0 - 100.0 fL   MCH 29.2 26.0 - 34.0 pg   MCHC 32.3 30.0 - 36.0 g/dL   RDW 14.6 11.5 - 15.5 %   Platelets 268 150 - 400 K/uL   Neutrophils Relative % 57 %   Neutro Abs 5.2 1.7 - 7.7 K/uL   Lymphocytes Relative 32 %   Lymphs Abs 2.9 0.7 - 4.0 K/uL   Monocytes Relative 8 %   Monocytes Absolute 0.7 0.1 -  1.0 K/uL   Eosinophils Relative 3 %   Eosinophils Absolute 0.3 0.0 - 0.7 K/uL   Basophils Relative 0 %   Basophils Absolute 0.0 0.0 - 0.1 K/uL    Comment: Performed at [facility] [address] [place], [place] [address]  Ammonia     Status: None   Collection Time: 01/01/18  6:15 PM  Result Value Ref Range   Ammonia 32 9 - 35 umol/L    Comment: Performed at [facility], [place] [address], [place], [place] [address]  Ethanol     Status: None   Collection Time: 01/01/18  6:15 PM  Result Value Ref Range   Alcohol, Ethyl (B) <10 <10 mg/dL    Comment:        LOWEST DETECTABLE LIMIT FOR SERUM ALCOHOL IS 10 mg/dL FOR MEDICAL PURPOSES ONLY Performed at [facility], [place] [address], [place], [place] [address]   I-Stat beta hCG blood, ED     Status: None   Collection Time: 01/01/18  6:24 PM  Result Value Ref Range   I-stat hCG, quantitative <5.0 <5 mIU/mL   Comment 3            Comment:   GEST. AGE      CONC.  (mIU/mL)   <=1 WEEK        5 - 50     2 WEEKS       50 - 500     3 WEEKS       100 - 10,000     4 WEEKS     1,000 - 30,000        FEMALE AND NON-PREGNANT FEMALE:     LESS THAN 5 mIU/mL   I-Stat Troponin, ED (not at [facility])     Status: None   Collection Time: 01/01/18  6:41 PM  Result Value Ref Range   Troponin i, poc 0.02 0.00 - 0.08 ng/mL   Comment 3            Comment: Due to the release kinetics of cTnI, a negative result within the first hours of the onset of symptoms does not rule out myocardial infarction with certainty. If myocardial infarction is still suspected, repeat the test at appropriate intervals.   I-Stat CG4 Lactic Acid, ED     Status: None   Collection Time: 01/01/18  6:43 PM  Result Value Ref Range   Lactic Acid, Venous 0.70 0.5 - 1.9 mmol/L  Blood culture (routine x 2)     Status: None   Collection Time: 01/01/18  8:25 PM  Result Value Ref Range   Specimen Description BLOOD LEFT ARM    Special Requests      BOTTLES DRAWN AEROBIC  AND ANAEROBIC Blood Culture adequate volume   Culture      NO GROWTH 5 DAYS Performed at [facility], [place] [address], [place], [place] [address]    Report Status 01/06/2018 FINAL   Brain natriuretic peptide     Status: None   Collection Time: 01/01/18  8:49 PM  Result Value Ref Range   B Natriuretic Peptide 97.4 0.0 - 100.0 pg/mL    Comment: Performed at [facility], [place] [address] [place], [place] [address]  I-Stat CG4 Lactic Acid, ED     Status: None   Collection Time: 01/01/18  8:54 PM  Result Value Ref Range   Lactic Acid, Venous 0.60 0.5 - 1.9 mmol/L  I-Stat venous  blood gas, ED     Status: Abnormal   Collection Time: 01/01/18  8:55 PM  Result Value Ref Range   pH, Ven 7.134 (LL) 7.250 - 7.430   pCO2, Ven 56.8 44.0 - 60.0 mmHg   pO2, Ven 66.0 (H) 32.0 - 45.0 mmHg   Bicarbonate 19.1 (L) 20.0 - 28.0 mmol/L   TCO2 21 (L) 22 - 32 mmol/L   O2 Saturation 85.0 %   Acid-base deficit 10.0 (H) 0.0 - 2.0 mmol/L   Patient temperature HIDE    Sample type VENOUS    Comment NOTIFIED PHYSICIAN   Blood culture (routine x 2)     Status: None   Collection Time: 01/01/18  9:14 PM  Result Value Ref Range   Specimen Description BLOOD LEFT HAND    Special Requests      BOTTLES DRAWN AEROBIC AND ANAEROBIC Blood Culture adequate volume   Culture      NO GROWTH 5 DAYS Performed at [facility], [place] [address] [place], [place] [address]    Report Status 01/06/2018 FINAL   CBG monitoring, ED     Status: Abnormal   Collection Time: 01/01/18  9:18 PM  Result Value Ref Range   Glucose-Capillary 40 (LL) 65 - 99 mg/dL  CBG monitoring, ED     Status: Abnormal   Collection Time: 01/01/18 10:11 PM  Result Value Ref Range   Glucose-Capillary 151 (H) 65 - 99 mg/dL   Comment 1 Notify RN    Comment 2 Document in Chart   Basic metabolic panel     Status: Abnormal   Collection Time: 01/01/18 11:14 PM  Result Value Ref Range   Sodium 139 135 - 145 mmol/L   Potassium 5.4 (H) 3.5 -  5.1 mmol/L   Chloride 111 101 - 111 mmol/L   CO2 19 (L) 22 - 32 mmol/L   Glucose, Bld 294 (H) 65 - 99 mg/dL   BUN 58 (H) 6 - 20 mg/dL   Creatinine, Ser 5.28 (H) 0.44 - 1.00 mg/dL   Calcium 8.3 (L) 8.9 - 10.3 mg/dL   GFR calc non Af Amer 8 (L) >60 mL/min   GFR calc Af Amer 10 (L) >60 mL/min    Comment: (NOTE) The eGFR has been calculated using the CKD EPI equation. This calculation has not been validated in all clinical situations. eGFR's persistently <60 mL/min signify possible Chronic Kidney Disease.    Anion gap 9 5 - 15    Comment: Performed at [facility] [address] [place], [place] [address]  Basic metabolic panel     Status: Abnormal   Collection Time: 01/02/18  3:08 AM  Result Value Ref Range   Sodium 138 135 - 145 mmol/L   Potassium 5.4 (H) 3.5 - 5.1 mmol/L   Chloride 108 101 - 111 mmol/L   CO2 21 (L) 22 - 32 mmol/L   Glucose, Bld 153 (H) 65 - 99 mg/dL   BUN 58 (H) 6 - 20 mg/dL   Creatinine, Ser 5.05 (H) 0.44 - 1.00 mg/dL   Calcium 8.1 (L) 8.9 - 10.3 mg/dL   GFR calc non Af Amer 9 (L) >60 mL/min   GFR calc Af Amer 10 (L) >60 mL/min    Comment: (NOTE) The eGFR has been calculated using the CKD EPI equation. This calculation has not been validated in all clinical situations. eGFR's persistently <60 mL/min signify possible Chronic Kidney Disease.    Anion gap 9 5 - 15  Comment: Performed at [facility], [place] [address], [place], [place] [address]  CBC     Status: Abnormal   Collection Time: 01/02/18  3:08 AM  Result Value Ref Range   WBC 8.6 4.0 - 10.5 K/uL   RBC 4.20 3.87 - 5.11 MIL/uL   Hemoglobin 11.9 (L) 12.0 - 15.0 g/dL   HCT 38.4 36.0 - 46.0 %   MCV 91.4 78.0 - 100.0 fL   MCH 28.3 26.0 - 34.0 pg   MCHC 31.0 30.0 - 36.0 g/dL   RDW 15.0 11.5 - 15.5 %   Platelets 250 150 - 400 K/uL    Comment: Performed at [facility], [place] [address], [place], [place] [address]  CBG monitoring, ED     Status: Abnormal   Collection Time: 01/02/18   4:17 AM  Result Value Ref Range   Glucose-Capillary 136 (H) 65 - 99 mg/dL  Urinalysis, Complete w Microscopic     Status: Abnormal   Collection Time: 01/02/18  6:00 AM  Result Value Ref Range   Color, Urine YELLOW YELLOW   APPearance HAZY (A) CLEAR   Specific Gravity, Urine 1.011 1.005 - 1.030   pH 5.0 5.0 - 8.0   Glucose, UA NEGATIVE NEGATIVE mg/dL   Hgb urine dipstick SMALL (A) NEGATIVE   Bilirubin Urine NEGATIVE NEGATIVE   Ketones, ur NEGATIVE NEGATIVE mg/dL   Protein, ur NEGATIVE NEGATIVE mg/dL   Nitrite NEGATIVE NEGATIVE   Leukocytes, UA MODERATE (A) NEGATIVE   RBC / HPF 0-5 0 - 5 RBC/hpf   WBC, UA 6-30 0 - 5 WBC/hpf   Bacteria, UA RARE (A) NONE SEEN   Squamous Epithelial / LPF 0-5 (A) NONE SEEN   Mucus PRESENT    Hyaline Casts, UA PRESENT     Comment: Performed at [facility], [place] [address], [place], [place] [address]  Urine rapid drug screen (hosp performed)     Status: Abnormal   Collection Time: 01/02/18  6:00 AM  Result Value Ref Range   Opiates POSITIVE (A) NONE DETECTED   Cocaine NONE DETECTED NONE DETECTED   Benzodiazepines NONE DETECTED NONE DETECTED   Amphetamines NONE DETECTED NONE DETECTED   Tetrahydrocannabinol NONE DETECTED NONE DETECTED   Barbiturates NONE DETECTED NONE DETECTED    Comment: (NOTE) DRUG SCREEN FOR MEDICAL PURPOSES ONLY.  IF CONFIRMATION IS NEEDED FOR ANY PURPOSE, NOTIFY LAB WITHIN 5 DAYS. LOWEST DETECTABLE LIMITS FOR URINE DRUG SCREEN Drug Class                     Cutoff (ng/mL) Amphetamine and metabolites    1000 Barbiturate and metabolites    200 Benzodiazepine                 350 Tricyclics and metabolites     300 Opiates and metabolites        300 Cocaine and metabolites        300 THC                            50 Performed at [facility], [place] [address], [place], [place] [address]   Urine culture     Status: None   Collection Time: 01/02/18  6:00 AM  Result Value Ref Range   Specimen Description URINE,  CATHETERIZED    Special Requests NONE    Culture      NO GROWTH Performed at [facility], [place] [address] [place], [place] [address]    Report Status 01/03/2018 FINAL   Creatinine, urine, random     Status: None   Collection Time: 01/02/18  6:00 AM  Result Value Ref Range   Creatinine, Urine 80.57 mg/dL    Comment: Performed at [facility] [address] [place], [place] [address]  Sodium, urine, random     Status: None   Collection Time: 01/02/18  6:00 AM  Result Value Ref Range   Sodium, Ur 41 mmol/L    Comment: Performed at [facility] [address], [place], [place] [address]  Troponin I     Status: None   Collection Time: 01/02/18  6:07 AM  Result Value Ref Range   Troponin I <0.03 <0.03 ng/mL    Comment: Performed at [facility] [address] [place], [place] [address]  CBG monitoring, ED     Status: Abnormal   Collection Time: 01/02/18  7:41 AM  Result Value Ref Range   Glucose-Capillary 122 (H) 65 - 99 mg/dL  CBG monitoring, ED     Status: None   Collection Time: 01/02/18  1:18 PM  Result Value Ref Range   Glucose-Capillary 80 65 - 99 mg/dL  Comprehensive metabolic panel     Status: Abnormal   Collection Time: 01/03/18  3:44 AM  Result Value Ref Range   Sodium 144 135 - 145 mmol/L   Potassium 4.4 3.5 - 5.1 mmol/L   Chloride 101 101 - 111 mmol/L   CO2 30 22 - 32 mmol/L   Glucose, Bld 115 (H) 65 - 99 mg/dL   BUN 43 (H) 6 - 20 mg/dL   Creatinine, Ser 4.09 (H) 0.44 - 1.00 mg/dL   Calcium 8.6 (L) 8.9 - 10.3 mg/dL   Total Protein 6.2 (L) 6.5 - 8.1 g/dL   Albumin 3.1 (L) 3.5 - 5.0 g/dL   AST 18 15 - 41 U/L   ALT 8 (L) 14 - 54 U/L   Alkaline Phosphatase 78 38 - 126 U/L   Total Bilirubin 0.5 0.3 - 1.2 mg/dL   GFR calc non Af Amer 11 (L) >60 mL/min   GFR calc Af Amer 13 (L) >60 mL/min    Comment: (NOTE) The eGFR has been calculated using the CKD EPI equation. This calculation has not been validated in all clinical situations. eGFR's  persistently <60 mL/min signify possible Chronic Kidney Disease.    Anion gap 13 5 - 15    Comment: Performed at [facility] [address] [place] [address]  CBC     Status: None   Collection Time: 01/03/18  3:44 AM  Result Value Ref Range   WBC 8.8 4.0 - 10.5 K/uL   RBC 4.91 3.87 - 5.11 MIL/uL   Hemoglobin 14.0 12.0 - 15.0 g/dL   HCT 43.5 36.0 - 46.0 %   MCV 88.6 78.0 - 100.0 fL   MCH 28.5 26.0 - 34.0 pg   MCHC 32.2 30.0 - 36.0 g/dL   RDW 13.9 11.5 - 15.5 %   Platelets 238 150 - 400 K/uL    Comment: Performed at [facility], [place] [address] [place], [place] [address]  MRSA PCR Screening     Status: None   Collection Time: 01/03/18  6:00 AM  Result Value Ref Range   MRSA by PCR NEGATIVE NEGATIVE    Comment:        The  GeneXpert MRSA Assay (FDA approved for NASAL specimens only), is one component of a comprehensive MRSA colonization surveillance program. It is not intended to diagnose MRSA infection nor to guide or monitor treatment for MRSA infections. Performed at [facility], [place] [address] [place], [place] [address]   Basic metabolic panel     Status: Abnormal   Collection Time: 01/04/18  6:34 AM  Result Value Ref Range   Sodium 142 135 - 145 mmol/L   Potassium 3.5 3.5 - 5.1 mmol/L   Chloride 104 101 - 111 mmol/L   CO2 28 22 - 32 mmol/L   Glucose, Bld 91 65 - 99 mg/dL   BUN 30 (H) 6 - 20 mg/dL   Creatinine, Ser 2.96 (H) 0.44 - 1.00 mg/dL    Comment: DELTA CHECK NOTED   Calcium 8.4 (L) 8.9 - 10.3 mg/dL   GFR calc non Af Amer 17 (L) >60 mL/min   GFR calc Af Amer 19 (L) >60 mL/min    Comment: (NOTE) The eGFR has been calculated using the CKD EPI equation. This calculation has not been validated in all clinical situations. eGFR's persistently <60 mL/min signify possible Chronic Kidney Disease.    Anion gap 10 5 - 15    Comment: Performed at [facility] [address] [place], [place] [address]  Magnesium     Status:  None   Collection Time: 01/04/18  6:34 AM  Result Value Ref Range   Magnesium 1.7 1.7 - 2.4 mg/dL    Comment: Performed at [facility] [address] [place], [place] [address]  Phosphorus     Status: None   Collection Time: 01/04/18  6:34 AM  Result Value Ref Range   Phosphorus 4.4 2.5 - 4.6 mg/dL    Comment: Performed at [facility] [address] [place], [place] [address]  Basic metabolic panel     Status: Abnormal   Collection Time: 01/05/18  9:37 AM  Result Value Ref Range   Sodium 140 135 - 145 mmol/L   Potassium 3.5 3.5 - 5.1 mmol/L   Chloride 103 101 - 111 mmol/L   CO2 26 22 - 32 mmol/L   Glucose, Bld 139 (H) 65 - 99 mg/dL   BUN 21 (H) 6 - 20 mg/dL   Creatinine, Ser 2.38 (H) 0.44 - 1.00 mg/dL   Calcium 8.9 8.9 - 10.3 mg/dL   GFR calc non Af Amer 22 (L) >60 mL/min   GFR calc Af Amer 25 (L) >60 mL/min    Comment: (NOTE) The eGFR has been calculated using the CKD EPI equation. This calculation has not been validated in all clinical situations. eGFR's persistently <60 mL/min signify possible Chronic Kidney Disease.    Anion gap 11 5 - 15    Comment: Performed at [facility] [address] [place], [place] [address]  Basic metabolic panel     Status: Abnormal   Collection Time: 01/06/18  8:09 AM  Result Value Ref Range   Sodium 142 135 - 145 mmol/L   Potassium 3.6 3.5 - 5.1 mmol/L   Chloride 106 101 - 111 mmol/L   CO2 24 22 - 32 mmol/L   Glucose, Bld 90 65 - 99 mg/dL   BUN 17 6 - 20 mg/dL   Creatinine, Ser 2.07 (H) 0.44 - 1.00 mg/dL   Calcium 9.0 8.9 - 10.3 mg/dL   GFR calc non Af Amer 26 (L) >60 mL/min   GFR  calc Af Amer 30 (L) >60 mL/min    Comment: (NOTE) The eGFR has been calculated using the CKD EPI equation. This calculation has not been validated in all clinical situations. eGFR's persistently <60 mL/min signify possible Chronic Kidney Disease.    Anion gap 12 5 - 15    Comment: Performed at [facility] [address], [place], [place] [address]   Lab Results  Component Value Date   HGBA1C 5.5 03/03/2017   MPG 111 03/03/2017   No results found for: PROLACTIN Lab Results  Component Value Date   CHOL 146 03/03/2017   TRIG 105 03/03/2017   HDL 46 03/03/2017   CHOLHDL 3.2 03/03/2017   VLDL 21 03/03/2017   LDLCALC 79 03/03/2017   LDLCALC 126 (H) 11/12/2016   No results found for: TSH  Therapeutic Level Labs: No results found for: LITHIUM No results found for: VALPROATE No components found for:  CBMZ  Current Medications: Current Outpatient Medications  Medication Sig Dispense Refill  . albuterol (PROAIR HFA) 108 (90 Base) MCG/ACT inhaler Inhale 2 puffs into the lungs every 6 (six) hours as needed for wheezing or shortness of breath.    [place] aspirin 325 MG tablet Take 325 mg by mouth daily.    [place] atorvastatin (LIPITOR) 40 MG tablet Take 1 tablet (40 mg total) by mouth daily. 30 tablet 1  . famotidine (PEPCID) 40 MG tablet Take 1 tablet (40 mg total) by mouth at bedtime. 30 tablet 1  . folic acid (FOLVITE) 1 MG tablet Take 1 tablet (1 mg total) by mouth daily.    [place] gabapentin (NEURONTIN) 600 MG tablet Take 1 tablet (600 mg total) by mouth daily.    . hydrOXYzine (VISTARIL) 50 MG capsule Take one to two capsule daily t bed time for insomnia (Patient taking differently: Take 50-100 mg by mouth at bedtime. For insomnia) 45 capsule 1  . Multiple Vitamin (MULTIVITAMIN WITH MINERALS) TABS tablet Take 1 tablet by mouth daily.    . Oxycodone HCl 10 MG TABS Take 10 mg by mouth 5 (five) times daily.   0  . thiamine 100 MG tablet Take 1 tablet (100 mg total) by mouth daily.    [place] Kitchen venlafaxine XR (EFFEXOR-XR) 150 MG 24 hr capsule Take 1 capsule (150 mg total) by mouth daily with breakfast. 30 capsule 1   No current facility-administered medications for this visit.      Musculoskeletal: Strength & Muscle Tone: spastic and decreased Gait & Station: unsteady Patient leans: N/A  Psychiatric Specialty  Exam: Review of Systems  Musculoskeletal: Positive for back pain and joint pain.  Neurological: Positive for tingling, focal weakness and weakness.    Blood pressure (!) 140/36, pulse 80, height '5\' 4"'$  (1.626 m), weight 127 lb (57.6 kg).Body mass index is 21.8 kg/m.  General Appearance: Casual  Eye Contact:  Fair  Speech:  Slow  Volume:  Normal  Mood:  Anxious  Affect:  Congruent  Thought Process:  Goal Directed  Orientation:  Full (Time, Place, and Person)  Thought Content: Logical   Suicidal Thoughts:  No  Homicidal Thoughts:  No  Memory:  Immediate;   Fair Recent;   Good Remote;   Good  Judgement:  Good  Insight:  Good  Psychomotor Activity:  Decreased  Concentration:  Concentration: Fair and Attention Span: Fair  Recall:  Good  Fund of Knowledge: Good  Language: Good  Akathisia:  No  Handed:  Right  AIMS (if indicated): not done  Assets:  Communication Skills Desire for Improvement Housing  ADL's:  Intact  Cognition: WNL  Sleep:  Good   Screenings: PHQ2-9     [facility]10/2017 in [facility] Office Visit from 06/10/2017 in [facility] Office Visit from 04/15/2017 in [facility] Office Visit from 03/15/2017 in [place]  PHQ-2 Total Score  0  '1  5  3  2  '$ PHQ-9 Total Score  -  -  20  18  -       Assessment and Plan: Generalized anxiety disorder.  Major depressive disorder, recurrent.  I review blood work results from recent hospital stay.  Her kidneys functions are improving.  She continues to have mild cognitive impairment and I reinforced to see a neurologist.  Continue Effexor XR 150 mg daily and Vistaril 50 mg 1 to 2 capsule as needed.  Patient is not interested in counseling.  Recommended to call us back if she has any question or any concern.  Follow-up in 2 months.   Kathlee Nations, MD 03/09/2018, 3:00 PM

## 2018-03-10 ENCOUNTER — Encounter: Payer: Self-pay | Admitting: Physical Medicine & Rehabilitation

## 2018-03-10 ENCOUNTER — Encounter: Payer: Medicaid Other | Attending: Physical Medicine & Rehabilitation

## 2018-03-10 ENCOUNTER — Ambulatory Visit (HOSPITAL_BASED_OUTPATIENT_CLINIC_OR_DEPARTMENT_OTHER): Payer: Medicaid Other | Admitting: Physical Medicine & Rehabilitation

## 2018-03-10 ENCOUNTER — Other Ambulatory Visit: Payer: Self-pay

## 2018-03-10 VITALS — BP 180/95 | HR 74 | Ht 64.0 in | Wt 133.0 lb

## 2018-03-10 DIAGNOSIS — R252 Cramp and spasm: Secondary | ICD-10-CM | POA: Insufficient documentation

## 2018-03-10 DIAGNOSIS — I69354 Hemiplegia and hemiparesis following cerebral infarction affecting left non-dominant side: Secondary | ICD-10-CM | POA: Insufficient documentation

## 2018-03-10 DIAGNOSIS — M79641 Pain in right hand: Secondary | ICD-10-CM | POA: Insufficient documentation

## 2018-03-10 DIAGNOSIS — G8110 Spastic hemiplegia affecting unspecified side: Secondary | ICD-10-CM

## 2018-03-10 DIAGNOSIS — G8929 Other chronic pain: Secondary | ICD-10-CM | POA: Insufficient documentation

## 2018-03-10 NOTE — Patient Instructions (Addendum)
I don't think the knuckle swelling was an allergic reaction to Botox.  Myobloc is a type B rather than Type A toxin and we can try that.

## 2018-03-10 NOTE — Progress Notes (Signed)
Subjective:    Patient ID: Kelsey Wade, female    DOB: Feb 16, 1961, 57 y.o.   MRN: [id number]  HPI  57 year old female with complaints of right upper extremity muscle tightness. 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  She is ambulatory. She has undergone physical therapy.  She did get a wrist splint. She underwent botulinum toxin injection 11/11/2017 Right biceps 100  Right FCU 50 Right FCR 50 Right FDS 50 Right FDP 50  Postinjection patient did have reduced spasticity  MAS 3 right finger flexors and left greater than FDP MAS 2 at the wrist flexor on the right MAS 2 at the elbow flexor on the right MAS 3 in the lumbricals of the right hand No pain with wrist or hand range of motion  Patient states she had right knuckle swelling post injection and saw her primary physician who thought it could have been an allergy to the medication.  Patient did not follow-up at this office for that however.  She has no allergy to egg whites. None of the injection sites were in the hand   Pain Inventory Average Pain 9 Pain Right Now 9 My pain is sharp, stabbing and aching  In the last 24 hours, has pain interfered with the following? General activity 5 Relation with others 3 Enjoyment of life 8 What TIME of day is your pain at its worst? evening Sleep (in general) Fair  Pain is worse with: walking, bending, sitting and standing Pain improves with: nothing Relief from Meds: 5  Mobility walk without assistance use a walker how many minutes can you walk? 10 ability to climb steps?  yes do you drive?  no  Function disabled: date disabled . I need assistance with the following:  dressing, bathing, meal prep, household duties and shopping  Neuro/Psych weakness tremor trouble walking spasms dizziness depression anxiety  Prior Studies Any changes since last visit?   no  Physicians involved in your care Any changes since last visit?  no   Family History  Problem Relation Age of Onset  . Alcohol abuse Father   . Alcohol abuse Brother   . Breast cancer Maternal Aunt   . Colon cancer Neg Hx   . Esophageal cancer Neg Hx   . Stomach cancer Neg Hx   . Rectal cancer Neg Hx    Social History   Socioeconomic History  . Marital status: Divorced    Spouse name: Not on file  . Number of children: 2  . Years of education: Not on file  . Highest education level: 9th grade  Occupational History  . Occupation: disabled  Social Needs  . Financial resource strain: Not on file  . Food insecurity:    Worry: Not on file    Inability: Not on file  . Transportation needs:    Medical: Not on file    Non-medical: Not on file  Tobacco Use  . Smoking status: Current Every Day Smoker    Packs/day: 0.50    Types: Cigarettes  . Smokeless tobacco: Never Used  . Tobacco comment: less  Substance and Sexual Activity  . Alcohol use: Yes    Comment: Occasionally   . Drug use: Yes    Types: Marijuana    Comment: quit 2 months ago 10-20-17  . Sexual activity: Not on file  Lifestyle  . Physical activity:    Days per week: Not on file  Minutes per session: Not on file  . Stress: Not on file  Relationships  . Social connections:    Talks on phone: Not on file    Gets together: Not on file    Attends religious service: Not on file    Active member of club or organization: Not on file    Attends meetings of clubs or organizations: Not on file    Relationship status: Not on file  Other Topics Concern  . Not on file  Social History Narrative   Divorced, lives in a 1 story home, with 2 steps to enter. Drinks 2 cups of coffee, and one Mtn. Dew a day. Prior to becoming disabled by CVA, patient was employed in house keeping.    Past Surgical History:  Procedure Laterality Date  . APPENDECTOMY     Past Medical History:  Diagnosis Date  . Anxiety   .  Arthritis   . Chronic back pain   . Depression   . ETOH abuse   . GERD (gastroesophageal reflux disease)   . HTN (hypertension)   . Hyperlipidemia   . Stroke [facility])    June 2018  . Tobacco abuse    BP (!) 180/95   Pulse 74   Ht 5\' 4"  (1.626 m)   Wt 133 lb (60.3 kg)   SpO2 93%   BMI 22.83 kg/m   Opioid Risk Score:   Fall Risk Score:  `1  Depression screen PHQ 2/9  Depression screen [facility] 2/9 03/10/2018 01/16/2018 08/10/2017 06/10/2017 06/10/2017 04/15/2017 03/15/2017  Decreased Interest 0 0 0 2 0 0 1  Down, Depressed, Hopeless 0 0 1 3 3 3 1   PHQ - 2 Score 0 0 1 5 3 3 2   Altered sleeping - - - 3 3 3  -  Tired, decreased energy - - - 3 3 3  -  Change in appetite - - - - 0 0 -  Feeling bad or failure about yourself  - - - 3 3 3  -  Trouble concentrating - - - 3 3 3  -  Moving slowly or fidgety/restless - - - 3 3 3  -  Suicidal thoughts - - - - 0 0 -  PHQ-9 Score - - - 20 18 18  -  Difficult doing work/chores - - - - - Not difficult at all -   Review of Systems  Constitutional: Positive for appetite change and diaphoresis.  Eyes: Negative.   Respiratory: Positive for cough, shortness of breath and wheezing.   Cardiovascular: Negative.   Gastrointestinal: Negative.   Endocrine: Negative.   Genitourinary: Negative.   Musculoskeletal: Positive for gait problem.       Spasms  Skin: Negative.   Allergic/Immunologic: Negative.   Neurological: Positive for dizziness, tremors and weakness.  Hematological: Bruises/bleeds easily.  Psychiatric/Behavioral: Positive for dysphoric mood. The patient is nervous/anxious.   All other systems reviewed and are negative.      Objective:   Physical Exam  Constitutional: She appears well-developed and well-nourished. No distress.  HENT:  Head: Normocephalic and atraumatic.  Eyes: Pupils are equal, round, and reactive to light. EOM are normal.  Skin: Skin is warm and dry. She is not diaphoretic.  Nursing note and vitals reviewed.  Chronic  dorsum of the hand swelling on the right side.  No evidence of effusion in the MCPs. Motor strength 3- at the right deltoid right biceps triceps 0 at the wrist extensors and finger extensors 2 minus at the wrist flexors  Tone MAS 4  at the third fourth fifth digit PIP/DIP MAS 3 at the second digit PIP DIP MAS 3 at the right wrist flexors MAS 2 at the elbow flexors on the right.       Assessment & Plan:  #1.  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 She has severe spasticity of finger flexors and this limits hygiene of the hand.  She is unable to use a resting hand splint. The degree of spasticity is beyond what oral medications could reasonably treat. We discussed the option of Myobloc rather than botulinum toxin.  Given that I do not think her postinjection swelling of the hand was allergic, I do not anticipate any type of cross reaction.  Myobloc is a botulinum toxin type B rather than type a. Other alternatives would include motor point injections with alcohol 50-95% Surgical referral for tendon lengthening is another potential option.  We will plan on injecting Myobloc 1000 units to FDP 500 units to FDS, 500 units FCR, 500 units FCU, Under EMG guidance

## 2018-03-30 ENCOUNTER — Ambulatory Visit: Payer: Self-pay | Admitting: Physical Medicine & Rehabilitation

## 2018-03-30 ENCOUNTER — Encounter: Payer: Medicaid Other | Attending: Physical Medicine & Rehabilitation

## 2018-03-30 DIAGNOSIS — M79641 Pain in right hand: Secondary | ICD-10-CM | POA: Insufficient documentation

## 2018-03-30 DIAGNOSIS — I69354 Hemiplegia and hemiparesis following cerebral infarction affecting left non-dominant side: Secondary | ICD-10-CM | POA: Insufficient documentation

## 2018-03-30 DIAGNOSIS — R252 Cramp and spasm: Secondary | ICD-10-CM | POA: Insufficient documentation

## 2018-03-30 DIAGNOSIS — G8929 Other chronic pain: Secondary | ICD-10-CM | POA: Insufficient documentation

## 2018-05-09 ENCOUNTER — Ambulatory Visit (HOSPITAL_COMMUNITY): Payer: Medicaid Other | Admitting: Psychiatry

## 2018-05-15 ENCOUNTER — Other Ambulatory Visit: Payer: Self-pay

## 2018-05-15 ENCOUNTER — Emergency Department (HOSPITAL_COMMUNITY): Payer: Medicaid Other

## 2018-05-15 ENCOUNTER — Encounter (HOSPITAL_COMMUNITY): Payer: Self-pay | Admitting: Emergency Medicine

## 2018-05-15 ENCOUNTER — Emergency Department (HOSPITAL_COMMUNITY)
Admission: EM | Admit: 2018-05-15 | Discharge: 2018-05-15 | Disposition: A | Discharge status: Home / Self Care | Payer: Medicaid Other | Attending: Emergency Medicine | Admitting: Emergency Medicine

## 2018-05-15 DIAGNOSIS — I10 Essential (primary) hypertension: Secondary | ICD-10-CM | POA: Insufficient documentation

## 2018-05-15 DIAGNOSIS — Z79899 Other long term (current) drug therapy: Secondary | ICD-10-CM | POA: Diagnosis not present

## 2018-05-15 DIAGNOSIS — R0789 Other chest pain: Secondary | ICD-10-CM | POA: Diagnosis not present

## 2018-05-15 DIAGNOSIS — F17210 Nicotine dependence, cigarettes, uncomplicated: Secondary | ICD-10-CM | POA: Insufficient documentation

## 2018-05-15 DIAGNOSIS — Z7982 Long term (current) use of aspirin: Secondary | ICD-10-CM | POA: Insufficient documentation

## 2018-05-15 DIAGNOSIS — M62838 Other muscle spasm: Secondary | ICD-10-CM

## 2018-05-15 DIAGNOSIS — R531 Weakness: Secondary | ICD-10-CM | POA: Diagnosis present

## 2018-05-15 LAB — BASIC METABOLIC PANEL
ANION GAP: 9 (ref 5–15)
BUN: 17 mg/dL (ref 6–20)
CALCIUM: 9.3 mg/dL (ref 8.9–10.3)
CO2: 27 mmol/L (ref 22–32)
Chloride: 107 mmol/L (ref 98–111)
Creatinine, Ser: 0.78 mg/dL (ref 0.44–1.00)
GFR calc Af Amer: >60 mL/min (ref >60)
GFR calc non Af Amer: >60 mL/min (ref >60)
GLUCOSE: 151 mg/dL — AB (ref 70–99)
Potassium: 4 mmol/L (ref 3.5–5.1)
Sodium: 143 mmol/L (ref 135–145)

## 2018-05-15 LAB — CBC
HCT: 40.4 % (ref 36.0–46.0)
Hemoglobin: 13 g/dL (ref 12.0–15.0)
MCH: 29.6 pg (ref 26.0–34.0)
MCHC: 32.2 g/dL (ref 30.0–36.0)
MCV: 92 fL (ref 78.0–100.0)
Platelets: 309 10*3/uL (ref 150–400)
RBC: 4.39 MIL/uL (ref 3.87–5.11)
RDW: 12.6 % (ref 11.5–15.5)
WBC: 9 10*3/uL (ref 4.0–10.5)

## 2018-05-15 LAB — I-STAT TROPONIN, ED: TROPONIN I, POC: 0 ng/mL (ref 0.00–0.08)

## 2018-05-15 MED ORDER — OXYCODONE HCL 5 MG PO TABS
15.0000 mg | ORAL_TABLET | Freq: Once | ORAL | Status: AC
  Administered 2018-05-15: 15 mg via ORAL
  Filled 2018-05-15: qty 3

## 2018-05-15 MED ORDER — DIAZEPAM 5 MG PO TABS: 5.0000 mg | ORAL_TABLET | Freq: Once | ORAL | Status: DC

## 2018-05-15 MED ORDER — LORAZEPAM 2 MG/ML IJ SOLN
1.0000 mg | Freq: Once | INTRAMUSCULAR | Status: AC
  Administered 2018-05-15: 1 mg via INTRAVENOUS
  Filled 2018-05-15: qty 1

## 2018-05-15 MED ORDER — CYCLOBENZAPRINE HCL 10 MG PO TABS: 10.0000 mg | ORAL_TABLET | Freq: Three times a day (TID) | ORAL | 0 refills | Status: DC | PRN

## 2018-05-15 NOTE — ED Notes (Signed)
Pt very anxious and sweating. She is making repetitive involuntary jerking movements. MD Little notified.

## 2018-05-15 NOTE — ED Notes (Signed)
Discharge instructions and prescriptions discussed with Pt. Pt verbalized understanding. Pt stable and ambulatory.   

## 2018-05-15 NOTE — ED Notes (Signed)
Patient transported to X-ray 

## 2018-05-15 NOTE — ED Triage Notes (Signed)
Pt here via [facility] from PCP [facility] c/o near syncopal episode at 0400 with muscle spasms right arm and right side CP. hx of stroke with right side deficit from that, pt given 324 ASA, 0.4 nitro.

## 2018-05-15 NOTE — ED Provider Notes (Signed)
[place] EMERGENCY DEPARTMENT Provider Note   CSN: [id number] Arrival date & time: 05/15/18  1653     History   Chief Complaint Chief Complaint  Patient presents with  . Chest Pain  . Spasms    HPI Kelsey Wade is a 57 y.o. female.  57yo F w/ PMH including CVA w/ R sided weakness complicated by R arm spastic hemiplegia, HTN, anxiety/depression, chronic pain on chronic opiates who p/w spasms and chest pain.  The patient states that she woke up this morning at 4 AM and walked to the bathroom.  Shortly after she walked back, she had a sudden onset of muscle spasms in her right arm associated with right chest pain.  She has had problems with muscle spasms in this arm ever since her stroke but normally it only lasts an hour.  Her muscle spasms today have lasted all day despite taking her home medications.  She has followed with a neurologist and PM&R for her arm problems.  She had a Botox injection in June but states that she had a reaction to it and has not had any recent treatment.  She reports chronic shortness of breath that is unchanged recently.  No nausea, vomiting, or diaphoresis.  Her chest pain is nonradiating and not associated with exertion.  It is severe and constant in her arm.  The history is provided by the patient.  Chest Pain      Past Medical History:  Diagnosis Date  . Anxiety   . Arthritis   . Chronic back pain   . Depression   . ETOH abuse   . GERD (gastroesophageal reflux disease)   . HTN (hypertension)   . Hyperlipidemia   . Stroke [facility])    June 2018  . Tobacco abuse     Patient Active Problem List   Diagnosis Date Noted  . AKI (acute kidney injury) ([place]) 01/04/2018  . Toxic metabolic encephalopathy 03/13/7627  . Alcohol withdrawal ([place]) 01/04/2018  . Spastic hemiplegia affecting dominant side ([place]) 10/25/2017  . Left carotid artery occlusion   . CVA (cerebrovascular accident) ([place]) 03/02/2017  . Chronic pain 03/02/2017  .  ETOH abuse 03/02/2017  . Ischemic stroke ([place])   . Hyperlipidemia LDL goal <100 11/15/2016  . Essential hypertension 11/12/2016  . Neuropathy 11/12/2016  . Tobacco dependence 11/12/2016    Past Surgical History:  Procedure Laterality Date  . APPENDECTOMY       OB History   None      Home Medications    Prior to Admission medications   Medication Sig Start Date End Date Taking? Authorizing Provider  albuterol (PROAIR HFA) 108 (90 Base) MCG/ACT inhaler Inhale 2 puffs into the lungs every 6 (six) hours as needed for wheezing or shortness of breath.   Yes [provider]  amLODipine (NORVASC) 10 MG tablet Take 10 mg by mouth daily. 01/27/18  Yes [provider]  aspirin 325 MG tablet Take 325 mg by mouth daily.   Yes [provider]  atorvastatin (LIPITOR) 40 MG tablet Take 1 tablet (40 mg total) by mouth daily. 03/04/17  Yes Barton Dubois, MD  CHANTIX STARTING MONTH PAK 0.5 MG X 11 & 1 MG X 42 tablet USE AS DIRECTED ON BACK OF PACKAGE 04/17/18  Yes [provider]  famotidine (PEPCID) 40 MG tablet Take 1 tablet (40 mg total) by mouth at bedtime. Patient taking differently: Take 40 mg by mouth daily.  10/03/17 10/03/18 Yes Dorena Dew, FNP  folic acid (FOLVITE) 1 MG tablet Take 1 tablet (1 mg total) by mouth daily. 01/07/18  Yes Samuella Cota, MD  gabapentin (NEURONTIN) 300 MG capsule Take 300 mg by mouth daily after breakfast.   Yes [provider]  hydrOXYzine (VISTARIL) 50 MG capsule Take one to two capsule daily at bed time for insomnia Patient taking differently: Take 100 mg by mouth at bedtime.  03/09/18  Yes Arfeen, Arlyce Harman, MD  Multiple Vitamin (MULTIVITAMIN WITH MINERALS) TABS tablet Take 1 tablet by mouth daily. 01/07/18  Yes Samuella Cota, MD  oxyCODONE (ROXICODONE) 15 MG immediate release tablet Take 1 tablet by mouth every 6 (six) hours.  03/06/18  Yes [provider]  venlafaxine XR (EFFEXOR-XR) 150 MG 24 hr  capsule Take 1 capsule (150 mg total) by mouth daily with breakfast. 03/09/18 03/09/19 Yes Arfeen, Arlyce Harman, MD  Vitamin D, Ergocalciferol, (DRISDOL) 50000 units CAPS capsule Take 50,000 Units by mouth every Saturday. 04/17/18  Yes [provider]  cyclobenzaprine (FLEXERIL) 10 MG tablet Take 1 tablet (10 mg total) by mouth 3 (three) times daily as needed for muscle spasms. 05/15/18   Little, Wenda Overland, MD  gabapentin (NEURONTIN) 600 MG tablet Take 1 tablet (600 mg total) by mouth daily. Patient not taking: Reported on 05/15/2018 01/06/18   Samuella Cota, MD  thiamine 100 MG tablet Take 1 tablet (100 mg total) by mouth daily. 01/07/18   Samuella Cota, MD    Family History Family History  Problem Relation Age of Onset  . Alcohol abuse Father   . Alcohol abuse Brother   . Breast cancer Maternal Aunt   . Colon cancer Neg Hx   . Esophageal cancer Neg Hx   . Stomach cancer Neg Hx   . Rectal cancer Neg Hx     Social History Social History   Tobacco Use  . Smoking status: Current Every Day Smoker    Packs/day: 0.50    Types: Cigarettes  . Smokeless tobacco: Never Used  . Tobacco comment: less  Substance Use Topics  . Alcohol use: Yes    Alcohol/week: 2.0 standard drinks    Types: 2 Cans of beer per week    Comment: occasionally   . Drug use: Yes    Types: Marijuana    Comment: quit 2 months ago 10-20-17     Allergies   Patient has no known allergies.   Review of Systems Review of Systems  Cardiovascular: Positive for chest pain.   All other systems reviewed and are negative except that which was mentioned in HPI   Physical Exam Updated Vital Signs BP (!) 144/90   Pulse 83   Temp 99 F (37.2 C) (Oral)   Resp 13   Ht 5\' 5"  (1.651 m)   Wt 60.3 kg   SpO2 98%   BMI 22.12 kg/m   Physical Exam  Constitutional: She is oriented to person, place, and time. She appears well-developed and well-nourished. No distress.  uncomfortable  HENT:  Head:  Normocephalic and atraumatic.  Moist mucous membranes  Eyes: Conjunctivae are normal.  Neck: Neck supple.  Cardiovascular: Normal rate, regular rhythm and normal heart sounds.  No murmur heard. Pulmonary/Chest: Effort normal and breath sounds normal.  Abdominal: Soft. Bowel sounds are normal. She exhibits no distension. There is no tenderness.  Musculoskeletal: She exhibits no edema.  No tenderness R arm or shoulder; no chest wall tenderness  Neurological: She is alert and oriented to person, place, and time.  No cranial nerve deficit.  Fluent speech Frequent 1-second jerks in R upper arm near deltoid and biceps; hemiplegia R arm; 4/5 strength RLE  Skin: Skin is warm and dry.  Psychiatric: Judgment normal. Her mood appears anxious.  Nursing note and vitals reviewed.    ED Treatments / Results  Labs (all labs ordered are listed, but only abnormal results are displayed) Labs Reviewed  BASIC METABOLIC PANEL - Abnormal; Notable for the following components:      Result Value   Glucose, Bld 151 (*)    All other components within normal limits  CBC  I-STAT TROPONIN, ED    EKG EKG Interpretation  Date/Time:  Monday May 15 2018 16:58:34 EDT Ventricular Rate:  105 PR Interval:    QRS Duration: 85 QT Interval:  355 QTC Calculation: 470 R Axis:   87 Text Interpretation:  Sinus tachycardia tachycardia new from previous Confirmed by Theotis Burrow [phone]) on 05/15/2018 5:05:07 PM   Radiology Dg Chest 2 View  Result Date: 05/15/2018 CLINICAL DATA:  Chest pain and shortness of breath. EXAM: CHEST - 2 VIEW COMPARISON:  01/01/2018 FINDINGS: The cardiomediastinal silhouette is within normal limits. Pulmonary vascular congestion on the prior study has resolved. There is improved aeration of the lung bases with mild left basilar atelectasis present currently. No pleural effusion or pneumothorax is identified. There is a chronic T12 compression fracture. IMPRESSION: Mild left basilar  atelectasis. Electronically Signed   By: Logan Bores M.D.   On: 05/15/2018 17:27    Procedures Procedures (including critical care time)  Medications Ordered in ED Medications  oxyCODONE (Oxy IR/ROXICODONE) immediate release tablet 15 mg (15 mg Oral Given 05/15/18 1834)  LORazepam (ATIVAN) injection 1 mg (1 mg Intravenous Given 05/15/18 1834)     Initial Impression / Assessment and Plan / ED Course  I have reviewed the triage vital signs and the nursing notes.  Pertinent labs & imaging results that were available during my care of the patient were reviewed by me and considered in my medical decision making (see chart for details).    On exam she was having frequent jerks/muscle spasms of R upper arm. She states CP has been present ever since R arm spasms started, has been constant for 13-14 hours, and has no other associated sx. EKG without acute ischemic changes and troponin negative.  I feel that her chest pain is related to the muscle spasms that she is having.  Gave Ativan as well as her home oxycodone dose.  Labs show no electrolyte derangements.  Chest x-ray negative acute.  On reassessment, patient was resting comfortably with no muscle spasms and stated that she felt much better.  Discussed supportive measures, follow-up with her outpatient specialist, and reviewed return precautions. Provided with Flexeril  And instructed to use extreme caution when combining this medication with home pain medication.  She and husband voiced understanding.  Final Clinical Impressions(s) / ED Diagnoses   Final diagnoses:  Muscle spasm  Atypical chest pain    ED Discharge Orders         Ordered    cyclobenzaprine (FLEXERIL) 10 MG tablet  3 times daily PRN     05/15/18 2050           Little, Wenda Overland, MD 05/15/18 2055

## 2018-05-15 NOTE — Discharge Instructions (Signed)
USE EXTREME CAUTION WHEN TAKING FLEXERIL WITH OXYCODONE AS THEY BOTH CAUSE SLEEPINESS. FOLLOW UP WITH PHYSICAL MED AND REHAB.

## 2018-05-23 ENCOUNTER — Encounter: Payer: Medicaid Other | Attending: Physical Medicine & Rehabilitation

## 2018-05-23 ENCOUNTER — Encounter: Payer: Self-pay | Admitting: Physical Medicine & Rehabilitation

## 2018-05-23 ENCOUNTER — Ambulatory Visit: Payer: Medicaid Other | Admitting: Physical Medicine & Rehabilitation

## 2018-05-23 VITALS — BP 154/97 | HR 112 | Resp 14 | Ht 64.0 in | Wt 127.0 lb

## 2018-05-23 DIAGNOSIS — G8110 Spastic hemiplegia affecting unspecified side: Secondary | ICD-10-CM | POA: Diagnosis not present

## 2018-05-23 DIAGNOSIS — G8929 Other chronic pain: Secondary | ICD-10-CM | POA: Diagnosis not present

## 2018-05-23 DIAGNOSIS — F17210 Nicotine dependence, cigarettes, uncomplicated: Secondary | ICD-10-CM | POA: Insufficient documentation

## 2018-05-23 DIAGNOSIS — E785 Hyperlipidemia, unspecified: Secondary | ICD-10-CM | POA: Insufficient documentation

## 2018-05-23 DIAGNOSIS — I10 Essential (primary) hypertension: Secondary | ICD-10-CM | POA: Diagnosis not present

## 2018-05-23 DIAGNOSIS — I69851 Hemiplegia and hemiparesis following other cerebrovascular disease affecting right dominant side: Secondary | ICD-10-CM | POA: Insufficient documentation

## 2018-05-23 DIAGNOSIS — M1990 Unspecified osteoarthritis, unspecified site: Secondary | ICD-10-CM | POA: Insufficient documentation

## 2018-05-23 DIAGNOSIS — G8111 Spastic hemiplegia affecting right dominant side: Secondary | ICD-10-CM | POA: Diagnosis present

## 2018-05-23 MED ORDER — TIZANIDINE HCL 2 MG PO TABS: 2.0000 mg | ORAL_TABLET | Freq: Three times a day (TID) | ORAL | 1 refills | Status: DC

## 2018-05-23 NOTE — Progress Notes (Signed)
Subjective:    Patient ID: Kelsey Wade, female    DOB: 12-02-1960, 57 y.o.   MRN: [id number] 56yo female with hx of CVA causing RUE.RLE weakness She was admitted to [facility] with subacute onset of right-sided weakness on 03/02/2017.  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  2D echo showed no evidence of cardiac embolic source, a statin was prescribed, dual antiplatelet therapy was recommended with Plavix and baby aspirin.  Patient was advised to discontinue tobacco use  HPI Chief complaint is spasm in the right hand and forearm.  Patient recently seen in the ED for arm and chest pain.  No cardiac issues.  This was on the right side.  Patient advised to take Flexeril. We reviewed the symptoms and basically this is the same issue that has been ongoing for the last 6 or more months. She has not tried tizanidine or baclofen. We did botulinum toxin injections on 11/11/2017 Right biceps 100  Right FCU 50 Right FCR 50 Right FDS 50 Right FDP 50  Preinjection tone was Modified Ashworth score 3 right elbow flexors, 3 for right wrist flexors 4 at finger flexors digits 4 and 3 and 2 at digits 2 and 5, both FDP FDS involved Pronator MAS 3 on the right side  Tone at 2 months postinjection  MAS 3 right finger flexors and left greater than FDP MAS 2 at the wrist flexor on the right MAS 2 at the elbow flexor on the right MAS 3 in the lumbricals of the right hand No pain with wrist or hand range of motion Pain Inventory Average Pain 8 Pain Right Now 8 My pain is sharp, burning, tingling and aching  In the last 24 hours, has pain interfered with the following? General activity 6 Relation with others 8 Enjoyment of life 8 What TIME of day is your pain at its worst? all Sleep (in general) Fair  Pain is worse with: walking, bending, sitting, inactivity and  standing Pain improves with: rest and medication Relief from Meds: 5  Mobility walk without assistance how many minutes can you walk? 15 do you drive?  no  Function I need assistance with the following:  feeding, dressing, bathing, meal prep, household duties and shopping  Neuro/Psych spasms confusion depression anxiety  Prior Studies Any changes since last visit?  no  Physicians involved in your care Any changes since last visit?  no   Family History  Problem Relation Age of Onset  . Alcohol abuse Father   . Alcohol abuse Brother   . Breast cancer Maternal Aunt   . Colon cancer Neg Hx   . Esophageal cancer Neg Hx   . Stomach cancer Neg Hx   . Rectal cancer Neg Hx    Social History   Socioeconomic History  . Marital status: Divorced    Spouse name: Not on file  . Number of children: 2  . Years of education: Not on file  . Highest education level: 9th grade  Occupational History  . Occupation: disabled  Social Needs  . Financial resource strain: Not on file  . Food insecurity:    Worry: Not on file    Inability: Not on file  . Transportation needs:    Medical: Not on file    Non-medical: Not on file  Tobacco Use  . Smoking status: Current Every Day Smoker    Packs/day:  0.50    Types: Cigarettes  . Smokeless tobacco: Never Used  . Tobacco comment: less  Substance and Sexual Activity  . Alcohol use: Yes    Alcohol/week: 2.0 standard drinks    Types: 2 Cans of beer per week    Comment: occasionally   . Drug use: Yes    Types: Marijuana    Comment: quit 2 months ago 10-20-17  . Sexual activity: Not on file  Lifestyle  . Physical activity:    Days per week: Not on file    Minutes per session: Not on file  . Stress: Not on file  Relationships  . Social connections:    Talks on phone: Not on file    Gets together: Not on file    Attends religious service: Not on file    Active member of club or organization: Not on file    Attends meetings of  clubs or organizations: Not on file    Relationship status: Not on file  Other Topics Concern  . Not on file  Social History Narrative   Divorced, lives in a 1 story home, with 2 steps to enter. Drinks 2 cups of coffee, and one Mtn. Dew a day. Prior to becoming disabled by CVA, patient was employed in house keeping.    Past Surgical History:  Procedure Laterality Date  . APPENDECTOMY     Past Medical History:  Diagnosis Date  . Anxiety   . Arthritis   . Chronic back pain   . Depression   . ETOH abuse   . GERD (gastroesophageal reflux disease)   . HTN (hypertension)   . Hyperlipidemia   . Stroke [facility])    June 2018  . Tobacco abuse    BP (!) 154/97   Pulse (!) 112   Resp 14   Ht 5\' 4"  (1.626 m)   Wt 127 lb (57.6 kg)   SpO2 98%   BMI 21.80 kg/m   Opioid Risk Score:   Fall Risk Score:  `1  Depression screen PHQ 2/9  Depression screen [facility] 2/9 03/10/2018 01/16/2018 08/10/2017 06/10/2017 06/10/2017 04/15/2017 03/15/2017  Decreased Interest 0 0 0 2 0 0 1  Down, Depressed, Hopeless 0 0 1 3 3 3 1   PHQ - 2 Score 0 0 1 5 3 3 2   Altered sleeping - - - 3 3 3  -  Tired, decreased energy - - - 3 3 3  -  Change in appetite - - - - 0 0 -  Feeling bad or failure about yourself  - - - 3 3 3  -  Trouble concentrating - - - 3 3 3  -  Moving slowly or fidgety/restless - - - 3 3 3  -  Suicidal thoughts - - - - 0 0 -  PHQ-9 Score - - - 20 18 18  -  Difficult doing work/chores - - - - - Not difficult at all -    Review of Systems  Constitutional: Negative.   HENT: Negative.   Eyes: Negative.   Respiratory: Negative.   Cardiovascular: Negative.   Gastrointestinal: Negative.   Endocrine: Negative.   Genitourinary: Negative.   Musculoskeletal:       Spasms spasticity  Skin: Negative.   Neurological: Negative.   Hematological: Negative.   Psychiatric/Behavioral: Positive for confusion and dysphoric mood. The patient is nervous/anxious.   All other systems reviewed and are negative.       Objective:   Physical Exam  Constitutional: She is oriented to person, place, and  time. She appears well-developed and well-nourished.  HENT:  Head: Normocephalic and atraumatic.  Eyes: Pupils are equal, round, and reactive to light. EOM are normal.  Neurological: She is alert and oriented to person, place, and time.  Skin: Skin is warm and dry.  Nursing note and vitals reviewed.   Tone MAS 3 in the wrist flexors MAS 3 in the right elbow flexors MAS 4 at the right MCP PIP DIP of digit #3 and MAS 3 at MCP PIP DIP of digits 2 4 and 5 Thumb MAS 1 at FPL  Motor strength is 3- at the deltoid 3- at the elbow flexor and extensor 0 at the wrist flexor extensor and finger flexor extensor as well as thumb flexor, thumb extensor is 2-      Assessment & Plan:  1.  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  The patient stated she had one knuckle that was swollen after the injection and we discussed that this is very unlikely to be related to Botox. We discussed her treatment options including physical therapy, oral medications, injectable medications and surgery. We will start tizanidine 2 mg 3 times daily, she is to inform us in 1 week if this is helping or not and if not would be increased to 4 times daily We also discussed injecting either Botox again which I think would be safe or another toxin.  Refer another toxin and specifically we talked about Myobloc. Her Myobloc dosing would be 500 units to the right bicep 500 to the right brachial radialis, 1000 units to the right FDS, 500 units to the right FDP, 1000 units of the right FCR, 1000 units of the right FCU 500 units of the lumbricals, will check for insurance approval Patient will call if she wishes to schedule

## 2018-05-23 NOTE — Patient Instructions (Addendum)
Treatment for spasticity is   PT/OT Splinting  Oral medications such as baclofen and tizanidine Botulinum toxin injections such as Botox, Dysport , and Myobloc (we have it in stock but need to make sure medicaid covers) Alcohol motor point blocks Tendon lengthening surgery

## 2018-06-02 ENCOUNTER — Other Ambulatory Visit (HOSPITAL_COMMUNITY): Payer: Self-pay | Admitting: Psychiatry

## 2018-06-02 DIAGNOSIS — F411 Generalized anxiety disorder: Secondary | ICD-10-CM

## 2018-06-16 ENCOUNTER — Ambulatory Visit: Payer: Self-pay | Admitting: Physical Medicine & Rehabilitation

## 2018-06-22 ENCOUNTER — Encounter: Payer: Self-pay | Admitting: Physical Medicine & Rehabilitation

## 2018-06-22 ENCOUNTER — Ambulatory Visit (HOSPITAL_BASED_OUTPATIENT_CLINIC_OR_DEPARTMENT_OTHER): Payer: Medicaid Other | Admitting: Physical Medicine & Rehabilitation

## 2018-06-22 ENCOUNTER — Encounter: Payer: Medicaid Other | Attending: Physical Medicine & Rehabilitation

## 2018-06-22 VITALS — BP 95/64 | HR 83 | Ht 64.0 in | Wt 131.0 lb

## 2018-06-22 DIAGNOSIS — I69351 Hemiplegia and hemiparesis following cerebral infarction affecting right dominant side: Secondary | ICD-10-CM | POA: Diagnosis not present

## 2018-06-22 DIAGNOSIS — G8110 Spastic hemiplegia affecting unspecified side: Secondary | ICD-10-CM

## 2018-06-22 NOTE — Progress Notes (Signed)
[place] injectionunder EMG guidance  Indication severe RIght spastic Hemiparesis due to CVA which causes pain and limits hygiene and ADLs.  Spasticity has not responded to medication management and other conservative care  Dilution 5000U per CC  27g 1" needle electrode   500 units to the right bicep 500 to the right brachial radialis, 1000 units to the right FDS, 500 units to the right FDP, 1000 units of the right FCR, 1000 units of the right FCU 500 units of the lumbricals,  Pt tolerated procedure well F/u in 6 wks Discussed possibility of needing higher dose

## 2018-06-22 NOTE — Patient Instructions (Signed)
You received a Myobloc  injection today. You may experience soreness at the needle injection sites. Please call us if any of the injection sites turns red after a couple days or if there is any drainage. You may experience muscle weakness as a result of Botox. This would improve with time but can take several weeks to improve. The Myobloc should start working in about one week. The Myobloc usually last 3 months. The injection can be repeated every 3 months as needed.  Will see you in 6 wks, to check on effect

## 2018-06-23 ENCOUNTER — Telehealth: Payer: Self-pay

## 2018-06-23 NOTE — Telephone Encounter (Signed)
Pt called asking if she needed to stop muscle relaxer because she got injection yesterday. Called pt back and no answer, left message for pt to call back.

## 2018-07-03 ENCOUNTER — Other Ambulatory Visit (HOSPITAL_COMMUNITY): Payer: Self-pay | Admitting: Psychiatry

## 2018-07-12 ENCOUNTER — Other Ambulatory Visit (HOSPITAL_COMMUNITY): Payer: Self-pay

## 2018-07-12 DIAGNOSIS — F411 Generalized anxiety disorder: Secondary | ICD-10-CM

## 2018-07-12 MED ORDER — VENLAFAXINE HCL ER 150 MG PO CP24: 150.0000 mg | ORAL_CAPSULE | Freq: Every day | ORAL | 1 refills | Status: DC

## 2018-07-13 ENCOUNTER — Inpatient Hospital Stay (HOSPITAL_COMMUNITY): Payer: Medicaid Other

## 2018-07-13 ENCOUNTER — Inpatient Hospital Stay (HOSPITAL_COMMUNITY)
Admission: EM | Admit: 2018-07-13 | Discharge: 2018-07-16 | DRG: 065 | Disposition: A | Discharge status: Home / Self Care | Payer: Medicaid Other | Attending: Internal Medicine | Admitting: Internal Medicine

## 2018-07-13 ENCOUNTER — Encounter (HOSPITAL_COMMUNITY): Payer: Self-pay | Admitting: Emergency Medicine

## 2018-07-13 ENCOUNTER — Emergency Department (HOSPITAL_COMMUNITY): Payer: Medicaid Other

## 2018-07-13 DIAGNOSIS — G894 Chronic pain syndrome: Secondary | ICD-10-CM | POA: Diagnosis present

## 2018-07-13 DIAGNOSIS — F1010 Alcohol abuse, uncomplicated: Secondary | ICD-10-CM | POA: Diagnosis present

## 2018-07-13 DIAGNOSIS — F17210 Nicotine dependence, cigarettes, uncomplicated: Secondary | ICD-10-CM | POA: Diagnosis present

## 2018-07-13 DIAGNOSIS — G4089 Other seizures: Secondary | ICD-10-CM | POA: Diagnosis present

## 2018-07-13 DIAGNOSIS — R29707 NIHSS score 7: Secondary | ICD-10-CM | POA: Diagnosis present

## 2018-07-13 DIAGNOSIS — G8929 Other chronic pain: Secondary | ICD-10-CM | POA: Diagnosis present

## 2018-07-13 DIAGNOSIS — I639 Cerebral infarction, unspecified: Secondary | ICD-10-CM | POA: Diagnosis present

## 2018-07-13 DIAGNOSIS — Z8673 Personal history of transient ischemic attack (TIA), and cerebral infarction without residual deficits: Secondary | ICD-10-CM | POA: Diagnosis not present

## 2018-07-13 DIAGNOSIS — Z7951 Long term (current) use of inhaled steroids: Secondary | ICD-10-CM | POA: Diagnosis not present

## 2018-07-13 DIAGNOSIS — R29898 Other symptoms and signs involving the musculoskeletal system: Secondary | ICD-10-CM | POA: Diagnosis not present

## 2018-07-13 DIAGNOSIS — G9389 Other specified disorders of brain: Secondary | ICD-10-CM | POA: Diagnosis present

## 2018-07-13 DIAGNOSIS — I69351 Hemiplegia and hemiparesis following cerebral infarction affecting right dominant side: Secondary | ICD-10-CM

## 2018-07-13 DIAGNOSIS — Z79899 Other long term (current) drug therapy: Secondary | ICD-10-CM

## 2018-07-13 DIAGNOSIS — E785 Hyperlipidemia, unspecified: Secondary | ICD-10-CM

## 2018-07-13 DIAGNOSIS — R569 Unspecified convulsions: Secondary | ICD-10-CM

## 2018-07-13 DIAGNOSIS — F329 Major depressive disorder, single episode, unspecified: Secondary | ICD-10-CM | POA: Diagnosis present

## 2018-07-13 DIAGNOSIS — I6522 Occlusion and stenosis of left carotid artery: Secondary | ICD-10-CM | POA: Diagnosis present

## 2018-07-13 DIAGNOSIS — G8191 Hemiplegia, unspecified affecting right dominant side: Secondary | ICD-10-CM | POA: Diagnosis not present

## 2018-07-13 DIAGNOSIS — R292 Abnormal reflex: Secondary | ICD-10-CM | POA: Diagnosis not present

## 2018-07-13 DIAGNOSIS — I63412 Cerebral infarction due to embolism of left middle cerebral artery: Secondary | ICD-10-CM

## 2018-07-13 DIAGNOSIS — Z7982 Long term (current) use of aspirin: Secondary | ICD-10-CM

## 2018-07-13 DIAGNOSIS — R4701 Aphasia: Secondary | ICD-10-CM | POA: Diagnosis present

## 2018-07-13 DIAGNOSIS — R451 Restlessness and agitation: Secondary | ICD-10-CM | POA: Diagnosis present

## 2018-07-13 DIAGNOSIS — I951 Orthostatic hypotension: Secondary | ICD-10-CM | POA: Diagnosis present

## 2018-07-13 DIAGNOSIS — Z79891 Long term (current) use of opiate analgesic: Secondary | ICD-10-CM | POA: Diagnosis not present

## 2018-07-13 DIAGNOSIS — I10 Essential (primary) hypertension: Secondary | ICD-10-CM | POA: Diagnosis present

## 2018-07-13 DIAGNOSIS — K219 Gastro-esophageal reflux disease without esophagitis: Secondary | ICD-10-CM | POA: Diagnosis present

## 2018-07-13 DIAGNOSIS — F17200 Nicotine dependence, unspecified, uncomplicated: Secondary | ICD-10-CM | POA: Diagnosis present

## 2018-07-13 DIAGNOSIS — I63232 Cerebral infarction due to unspecified occlusion or stenosis of left carotid arteries: Secondary | ICD-10-CM | POA: Diagnosis not present

## 2018-07-13 LAB — COMPREHENSIVE METABOLIC PANEL
ALBUMIN: 3.9 g/dL (ref 3.5–5.0)
ALT: 15 U/L (ref 0–44)
AST: 18 U/L (ref 15–41)
Alkaline Phosphatase: 77 U/L (ref 38–126)
Anion gap: 6 (ref 5–15)
BILIRUBIN TOTAL: 0.5 mg/dL (ref 0.3–1.2)
BUN: 39 mg/dL — AB (ref 6–20)
CO2: 24 mmol/L (ref 22–32)
CREATININE: 1.04 mg/dL — AB (ref 0.44–1.00)
Calcium: 9.1 mg/dL (ref 8.9–10.3)
Chloride: 104 mmol/L (ref 98–111)
GFR calc Af Amer: >60 mL/min (ref >60)
GFR calc non Af Amer: 59 mL/min — ABNORMAL LOW (ref >60)
GLUCOSE: 98 mg/dL (ref 70–99)
Potassium: 5.3 mmol/L — ABNORMAL HIGH (ref 3.5–5.1)
Sodium: 134 mmol/L — ABNORMAL LOW (ref 135–145)
Total Protein: 7.2 g/dL (ref 6.5–8.1)

## 2018-07-13 LAB — I-STAT TROPONIN, ED: TROPONIN I, POC: 0.01 ng/mL (ref 0.00–0.08)

## 2018-07-13 LAB — CBC WITH DIFFERENTIAL/PLATELET
ABS IMMATURE GRANULOCYTES: 0.03 10*3/uL (ref 0.00–0.07)
Basophils Absolute: 0.1 10*3/uL (ref 0.0–0.1)
Basophils Relative: 1 %
EOS ABS: 0.2 10*3/uL (ref 0.0–0.5)
Eosinophils Relative: 3 %
HEMATOCRIT: 41.2 % (ref 36.0–46.0)
HEMOGLOBIN: 12.4 g/dL (ref 12.0–15.0)
IMMATURE GRANULOCYTES: 0 %
LYMPHS ABS: 2 10*3/uL (ref 0.7–4.0)
LYMPHS PCT: 26 %
MCH: 27.6 pg (ref 26.0–34.0)
MCHC: 30.1 g/dL (ref 30.0–36.0)
MCV: 91.8 fL (ref 80.0–100.0)
MONOS PCT: 8 %
Monocytes Absolute: 0.6 10*3/uL (ref 0.1–1.0)
NEUTROS ABS: 4.6 10*3/uL (ref 1.7–7.7)
Neutrophils Relative %: 62 %
Platelets: 313 10*3/uL (ref 150–400)
RBC: 4.49 MIL/uL (ref 3.87–5.11)
RDW: 13.8 % (ref 11.5–15.5)
WBC: 7.5 10*3/uL (ref 4.0–10.5)
nRBC: 0 % (ref 0.0–0.2)

## 2018-07-13 LAB — RAPID URINE DRUG SCREEN, HOSP PERFORMED
AMPHETAMINES: NOT DETECTED
BENZODIAZEPINES: NOT DETECTED
Barbiturates: NOT DETECTED
COCAINE: NOT DETECTED
Opiates: POSITIVE — AB
Tetrahydrocannabinol: NOT DETECTED

## 2018-07-13 LAB — URINALYSIS, ROUTINE W REFLEX MICROSCOPIC
BILIRUBIN URINE: NEGATIVE
GLUCOSE, UA: NEGATIVE mg/dL
HGB URINE DIPSTICK: NEGATIVE
Ketones, ur: NEGATIVE mg/dL
Leukocytes, UA: NEGATIVE
Nitrite: NEGATIVE
Protein, ur: NEGATIVE mg/dL
SPECIFIC GRAVITY, URINE: 1.014 (ref 1.005–1.030)
pH: 5 (ref 5.0–8.0)

## 2018-07-13 LAB — I-STAT CHEM 8, ED
BUN: 39 mg/dL — AB (ref 6–20)
CALCIUM ION: 1.11 mmol/L — AB (ref 1.15–1.40)
Chloride: 104 mmol/L (ref 98–111)
Creatinine, Ser: 1 mg/dL (ref 0.44–1.00)
GLUCOSE: 96 mg/dL (ref 70–99)
HCT: 40 % (ref 36.0–46.0)
Hemoglobin: 13.6 g/dL (ref 12.0–15.0)
Potassium: 5.2 mmol/L — ABNORMAL HIGH (ref 3.5–5.1)
Sodium: 135 mmol/L (ref 135–145)
TCO2: 27 mmol/L (ref 22–32)

## 2018-07-13 LAB — APTT: aPTT: 34 seconds (ref 24–36)

## 2018-07-13 LAB — PROTIME-INR
INR: 1.01
PROTHROMBIN TIME: 13.2 s (ref 11.4–15.2)

## 2018-07-13 LAB — CBC
HCT: 40.6 % (ref 36.0–46.0)
HEMOGLOBIN: 12.4 g/dL (ref 12.0–15.0)
MCH: 27.5 pg (ref 26.0–34.0)
MCHC: 30.5 g/dL (ref 30.0–36.0)
MCV: 90 fL (ref 80.0–100.0)
NRBC: 0 % (ref 0.0–0.2)
PLATELETS: 263 10*3/uL (ref 150–400)
RBC: 4.51 MIL/uL (ref 3.87–5.11)
RDW: 13.8 % (ref 11.5–15.5)
WBC: 7.1 10*3/uL (ref 4.0–10.5)

## 2018-07-13 LAB — I-STAT BETA HCG BLOOD, ED (MC, WL, AP ONLY)

## 2018-07-13 LAB — CREATININE, SERUM
CREATININE: 0.88 mg/dL (ref 0.44–1.00)
GFR calc non Af Amer: >60 mL/min (ref >60)

## 2018-07-13 LAB — ETHANOL: Alcohol, Ethyl (B): <10 mg/dL (ref <10)

## 2018-07-13 MED ORDER — LORAZEPAM 1 MG PO TABS
1.0000 mg | ORAL_TABLET | Freq: Four times a day (QID) | ORAL | Status: DC | PRN
  Administered 2018-07-15: 1 mg via ORAL
  Filled 2018-07-13: qty 1

## 2018-07-13 MED ORDER — CYCLOBENZAPRINE HCL 10 MG PO TABS: 10.0000 mg | ORAL_TABLET | Freq: Three times a day (TID) | ORAL | Status: DC | PRN

## 2018-07-13 MED ORDER — LEVETIRACETAM IN NACL 500 MG/100ML IV SOLN
500.0000 mg | Freq: Two times a day (BID) | INTRAVENOUS | Status: DC
  Administered 2018-07-13 – 2018-07-14 (×2): 500 mg via INTRAVENOUS
  Filled 2018-07-13 (×2): qty 100

## 2018-07-13 MED ORDER — FOLIC ACID 1 MG PO TABS: 1.0000 mg | ORAL_TABLET | Freq: Every day | ORAL | Status: DC

## 2018-07-13 MED ORDER — VITAMIN D (ERGOCALCIFEROL) 1.25 MG (50000 UNIT) PO CAPS
50000.0000 [IU] | ORAL_CAPSULE | ORAL | Status: DC
  Administered 2018-07-15: 50000 [IU] via ORAL
  Filled 2018-07-13: qty 1

## 2018-07-13 MED ORDER — ENOXAPARIN SODIUM 40 MG/0.4ML ~~LOC~~ SOLN
40.0000 mg | SUBCUTANEOUS | Status: DC
  Administered 2018-07-13 – 2018-07-15 (×2): 40 mg via SUBCUTANEOUS
  Filled 2018-07-13 (×3): qty 0.4

## 2018-07-13 MED ORDER — ACETAMINOPHEN 650 MG RE SUPP: 650.0000 mg | RECTAL | Status: DC | PRN

## 2018-07-13 MED ORDER — FOLIC ACID 1 MG PO TABS
1.0000 mg | ORAL_TABLET | Freq: Every day | ORAL | Status: DC
  Administered 2018-07-14 – 2018-07-16 (×3): 1 mg via ORAL
  Filled 2018-07-13 (×2): qty 1

## 2018-07-13 MED ORDER — ACETAMINOPHEN 160 MG/5ML PO SOLN: 650.0000 mg | ORAL | Status: DC | PRN

## 2018-07-13 MED ORDER — GABAPENTIN 300 MG PO CAPS
300.0000 mg | ORAL_CAPSULE | Freq: Every day | ORAL | Status: DC
  Administered 2018-07-14 – 2018-07-16 (×3): 300 mg via ORAL
  Filled 2018-07-13 (×3): qty 1

## 2018-07-13 MED ORDER — TIZANIDINE HCL 4 MG PO TABS
2.0000 mg | ORAL_TABLET | Freq: Three times a day (TID) | ORAL | Status: DC
  Administered 2018-07-14 – 2018-07-16 (×7): 2 mg via ORAL
  Filled 2018-07-13 (×7): qty 1

## 2018-07-13 MED ORDER — ADULT MULTIVITAMIN W/MINERALS CH: 1.0000 | ORAL_TABLET | Freq: Every day | ORAL | Status: DC

## 2018-07-13 MED ORDER — THIAMINE HCL 100 MG PO TABS: 100.0000 mg | ORAL_TABLET | Freq: Every day | ORAL | Status: DC

## 2018-07-13 MED ORDER — VENLAFAXINE HCL ER 75 MG PO CP24
150.0000 mg | ORAL_CAPSULE | Freq: Every day | ORAL | Status: DC
  Administered 2018-07-14 – 2018-07-16 (×3): 150 mg via ORAL
  Filled 2018-07-13 (×3): qty 2

## 2018-07-13 MED ORDER — THIAMINE HCL 100 MG/ML IJ SOLN: 100.0000 mg | Freq: Every day | INTRAMUSCULAR | Status: DC

## 2018-07-13 MED ORDER — SODIUM CHLORIDE 0.9 % IV SOLN
INTRAVENOUS | Status: DC
  Administered 2018-07-13 – 2018-07-14 (×2): via INTRAVENOUS

## 2018-07-13 MED ORDER — STROKE: EARLY STAGES OF RECOVERY BOOK
Freq: Once | Status: AC
  Administered 2018-07-13: 22:00:00

## 2018-07-13 MED ORDER — CLOPIDOGREL BISULFATE 75 MG PO TABS
75.0000 mg | ORAL_TABLET | Freq: Every day | ORAL | Status: DC
  Administered 2018-07-14 – 2018-07-16 (×3): 75 mg via ORAL
  Filled 2018-07-13 (×3): qty 1

## 2018-07-13 MED ORDER — VITAMIN B-1 100 MG PO TABS
100.0000 mg | ORAL_TABLET | Freq: Every day | ORAL | Status: DC
  Administered 2018-07-14 – 2018-07-16 (×3): 100 mg via ORAL
  Filled 2018-07-13 (×3): qty 1

## 2018-07-13 MED ORDER — FAMOTIDINE 20 MG PO TABS
40.0000 mg | ORAL_TABLET | Freq: Every day | ORAL | Status: DC
  Administered 2018-07-14 – 2018-07-16 (×3): 40 mg via ORAL
  Filled 2018-07-13 (×2): qty 2
  Filled 2018-07-13: qty 4

## 2018-07-13 MED ORDER — ACETAMINOPHEN 325 MG PO TABS
650.0000 mg | ORAL_TABLET | ORAL | Status: DC | PRN
  Administered 2018-07-15: 650 mg via ORAL
  Filled 2018-07-13: qty 2

## 2018-07-13 MED ORDER — LEVETIRACETAM IN NACL 1000 MG/100ML IV SOLN
1000.0000 mg | Freq: Once | INTRAVENOUS | Status: AC
  Administered 2018-07-13: 1000 mg via INTRAVENOUS
  Filled 2018-07-13: qty 100

## 2018-07-13 MED ORDER — GABAPENTIN 600 MG PO TABS: 600.0000 mg | ORAL_TABLET | Freq: Every day | ORAL | Status: DC

## 2018-07-13 MED ORDER — OXYCODONE HCL 5 MG PO TABS
15.0000 mg | ORAL_TABLET | Freq: Four times a day (QID) | ORAL | Status: DC
  Administered 2018-07-14 – 2018-07-16 (×8): 15 mg via ORAL
  Filled 2018-07-13 (×8): qty 3

## 2018-07-13 MED ORDER — LORAZEPAM 2 MG/ML IJ SOLN
1.0000 mg | Freq: Once | INTRAMUSCULAR | Status: AC
  Administered 2018-07-13: 1 mg via INTRAVENOUS
  Filled 2018-07-13: qty 1

## 2018-07-13 MED ORDER — SENNOSIDES-DOCUSATE SODIUM 8.6-50 MG PO TABS: 1.0000 | ORAL_TABLET | Freq: Every evening | ORAL | Status: DC | PRN

## 2018-07-13 MED ORDER — ATORVASTATIN CALCIUM 40 MG PO TABS
40.0000 mg | ORAL_TABLET | Freq: Every day | ORAL | Status: DC
  Administered 2018-07-14 – 2018-07-15 (×2): 40 mg via ORAL
  Filled 2018-07-13 (×2): qty 1

## 2018-07-13 MED ORDER — ADULT MULTIVITAMIN W/MINERALS CH
1.0000 | ORAL_TABLET | Freq: Every day | ORAL | Status: DC
  Administered 2018-07-14 – 2018-07-16 (×3): 1 via ORAL
  Filled 2018-07-13 (×3): qty 1

## 2018-07-13 MED ORDER — ALBUTEROL SULFATE (2.5 MG/3ML) 0.083% IN NEBU: 2.5000 mg | INHALATION_SOLUTION | RESPIRATORY_TRACT | Status: DC | PRN

## 2018-07-13 MED ORDER — SODIUM CHLORIDE 0.9 % IV BOLUS
1000.0000 mL | Freq: Once | INTRAVENOUS | Status: DC
  Administered 2018-07-13: 1000 mL via INTRAVENOUS

## 2018-07-13 MED ORDER — ASPIRIN 325 MG PO TABS
325.0000 mg | ORAL_TABLET | Freq: Every day | ORAL | Status: DC
  Administered 2018-07-14 – 2018-07-16 (×3): 325 mg via ORAL
  Filled 2018-07-13 (×3): qty 1

## 2018-07-13 MED ORDER — LORAZEPAM 2 MG/ML IJ SOLN
1.0000 mg | Freq: Four times a day (QID) | INTRAMUSCULAR | Status: DC | PRN
  Administered 2018-07-14 (×2): 1 mg via INTRAVENOUS
  Filled 2018-07-13 (×2): qty 1

## 2018-07-13 NOTE — Consult Note (Signed)
Requesting Physician: Dr. Billy Fischer    Chief Complaint: Jerking of right side of body, increased weakness  History obtained from: Patient and Chart     HPI:                                                                                                                                       Kelsey Wade is an 57 y.o. 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  ED course:  Patient was brought by EMS after her PCP noted that her weakness was worse.  On assessment by ED physician patient had expressive aphasia and frequent jerks in her right arm.  A CT head was performed which showed no acute findings but redemonstrating her chronic left encephalomalacia.  Neurology was consulted for further evaluation..  On my assessment patient seem to have frequent brief jerks of her right arm sometimes involving the right leg.  This was concerning for partial seizures and administered 1 mg of Ativan and 1000 mg of IV Keppra.  Her jerking resolved shortly after and patient speech also improved.  An MRI brain was obtained which showed punctate infarcts in the left hemisphere.      Past Medical History:  Diagnosis Date  . Anxiety   . Arthritis   . Chronic back pain   . Depression   . ETOH abuse   . GERD (gastroesophageal reflux disease)   . HTN (hypertension)   . Hyperlipidemia   . Stroke [facility])    June 2018  . Tobacco abuse     Past Surgical History:  Procedure Laterality Date  . APPENDECTOMY      Family History  Problem Relation Age of Onset  . Alcohol abuse Father   . Alcohol abuse Brother   . Breast cancer Maternal Aunt   . Colon cancer Neg Hx   . Esophageal cancer Neg Hx   . Stomach cancer Neg Hx   . Rectal cancer Neg Hx    Social History:  reports that she has been smoking  cigarettes. She has been smoking about 0.50 packs per day. She has never used smokeless tobacco. She reports that she drinks about 2.0 standard drinks of alcohol per week. She reports that she has current or past drug history. Drug: Marijuana.  Allergies: No Known Allergies  Medications:  I reviewed home medications  ROS:                                                                                                                                     14 systems reviewed and negative except above  Examination:                                                                                                      General: Appears well-developed and well-nourished.  Psych: Affect appropriate to situation Eyes: No scleral injection HENT: No OP obstrucion Head: Normocephalic.  Cardiovascular: Normal rate and regular rhythm.  Respiratory: Effort normal and breath sounds normal to anterior ascultation GI: Soft.  No distension. There is no tenderness.  Skin: WDI    Neurological Examination Mental Status: Alert, oriented, thought content appropriate.  Expressive aphasia.  Able to follow simple step commands without difficulty. Cranial Nerves: II: Visual fields : Difficult to assess, appears to be reduced over the left visual fields III,IV, VI: ptosis not present, extra-ocular motions intact bilaterally, pupils equal, round, reactive to light and accommodation V,VII: Mild right nasolabial fold flattening VIII: hearing normal bilaterally IX,X: uvula rises symmetrically XI: bilateral shoulder shrug XII: midline tongue extension Motor: Right : Upper extremity   2/5    Left:     Upper extremity   5/5  Lower extremity   3/5     Lower extremity   5/5 Tone and bulk: Wasting and spasticity noted on the right upper and lower extremity Sensory: Reduced over right arm and leg to light  touch, no neglect Deep Tendon Reflexes: 3+ on right patella, 2+ over left biceps and patella Plantars: Right: downgoing   Left: downgoing Cerebellar: No obvious ataxia Gait: Not assessed   Lab Results: Basic Metabolic Panel: Recent Labs  Lab 07/13/18 1007 07/13/18 1036  NA 134* 135  K 5.3* 5.2*  CL 104 104  CO2 24  --   GLUCOSE 98 96  BUN 39* 39*  CREATININE 1.04* 1.00  CALCIUM 9.1  --     CBC: Recent Labs  Lab 07/13/18 1007 07/13/18 1036  WBC 7.5  --   NEUTROABS 4.6  --   HGB 12.4 13.6  HCT 41.2 40.0  MCV 91.8  --   PLT 313  --     Coagulation Studies: Recent Labs    07/13/18 1007  LABPROT 13.2  INR 1.01    Imaging: Ct Head Wo Contrast  Result Date: 07/13/2018 CLINICAL DATA:  Right-sided weakness  and numbness.  Aphasia EXAM: CT HEAD WITHOUT CONTRAST TECHNIQUE: Contiguous axial images were obtained from the base of the skull through the vertex without intravenous contrast. COMPARISON:  December 22, 2017 FINDINGS: Brain: The ventricles are normal in size and configuration. There is no intracranial mass, hemorrhage, extra-axial fluid collection, or midline shift. There is a prior infarct involving the superior left frontal lobe as well as portions of the left parietal lobe, stable. There is a prior infarct in the superomedial left occipital lobe. There is evidence of a prior infarct in the superior mid to posterior right frontal lobe as well as in a portion of the posterior right parietal lobe. There is evidence of a prior white matter infarct in the posterior right centrum semiovale. There are prior small infarcts in the posterior mid right cerebellum and in the mid cerebellar regions bilaterally. There is a prior small infarct in the anterior left centrum semiovale adjacent to the frontal horn of the left lateral ventricle. No convincing acute infarct is evident on the current examination. Vascular: There is no appreciable hyperdense vessel. There is calcification in each  carotid siphon region. Skull: The bony calvarium appears intact. Sinuses/Orbits: There is mucosal thickening in several ethmoid air cells. Other paranasal sinuses are clear. Orbits appear symmetric bilaterally. Other: Mastoid air cells are clear. IMPRESSION: 1. Multifocal supratentorial and infratentorial infarcts, more severe on the left than on the right, essentially stable compared to most recent study. No acute infarct is appreciable currently. It should be noted that a subtle infarct on the left side given the other changes could be easily obscured. In this regard, it may be reasonable to consider MR with diffusion imaging to further assess. 2.  No mass or hemorrhage is appreciable. 3.  There are foci of arterial vascular calcification. 4.  Mild mucosal thickening noted in several ethmoid air cells. Electronically Signed   By: Lowella Grip III M.D.   On: 07/13/2018 12:15     I have reviewed the above imaging    ASSESSMENT AND PLAN   57 y.o. 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    You can look them up on [email or web address]  Password [facility]   Sushanth Aroor [facility] Pager Number [phone]

## 2018-07-13 NOTE — ED Notes (Signed)
Neurologist at bedside. 

## 2018-07-13 NOTE — ED Notes (Signed)
O2 levels went down to 78% on room air, 2 Lit [place] given to patient.

## 2018-07-13 NOTE — ED Notes (Signed)
Patient transported to CT 

## 2018-07-13 NOTE — Consult Note (Signed)
Neurology Consultation  Reason for Consult: stroke-like symptoms  Referring Physician: Billy Fischer  CC: right-side weakness and numbness   History is obtained from:patient and chart   HPI: Jenney Brester is a 57 y.o. 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 The patient has expressive aphasia and it is underdetermined whether this is new or chronic from her previous CVA; therefore, obtaining a thorough history of events was difficult.   ED course: The patient was brought in by EMS from her PCP office at [facility] due to patient reports of right are weakness, numbness and tingling. Upon ED arrival, the patient had expressive aphasia, c/o right arm pain, was AO to person, place and situation and had some noted jerking movements in her right arm. A CT Head scan was performed and showed no acute infarct, thus Neurology was consulted for further evaluation of the patient's clinical manifestations.     Upon assessment the patient was AOx4 and she complained of severe pain in the right arm when it was passively moved during the examination. The patient was found to have somewhat rhythmic movements in the right arm and more noticeably in the right hand. Patient was given 1mg  Ativan and 1g of Keppra I.V. The patient did not have any further complaints at this time.   LKW: unknown  tpa given?: no Premorbid modified Rankin scale (mRS): 3 0-Completely asymptomatic and back to baseline post-stroke 1-No significant post stroke disability and can perform usual duties with stroke symptoms 2-Slight disability-UNABLE to perform all activities but does not need assistance  3-Moderate disability-requires help but walks WITHOUT assistance 4-Needs assistance to walk and tend to bodily  needs 5-Severe disability-bedridden, incontinent, needs constant attention 6- Death    PPI:RJJOAC to obtain due to expressive aphasia   Past Medical History:  Diagnosis Date  . Anxiety   . Arthritis   . Chronic back pain   . Depression   . ETOH abuse   . GERD (gastroesophageal reflux disease)   . HTN (hypertension)   . Hyperlipidemia   . Stroke [facility])    June 2018  . Tobacco abuse     3-Moderate disability-requires help but walks WITHOUT assistance Essential (primary) hypertension, Acute Kidney Failure and Hyper OR Hypo Potassium   Family History  Problem Relation Age of Onset  . Alcohol abuse Father   . Alcohol abuse Brother   . Breast cancer Maternal Aunt   . Colon cancer Neg Hx   . Esophageal cancer Neg Hx   . Stomach cancer Neg Hx   . Rectal cancer Neg Hx      Social History:   reports that she has been smoking cigarettes. She has been smoking about 0.50 packs per day. She has never used smokeless tobacco. She reports that she drinks about 2.0 standard drinks of alcohol per week. She reports that she has current or past drug history. Drug: Marijuana.  Medications  Current Facility-Administered Medications:  .  sodium chloride 0.9 % bolus 1,000 mL, 1,000 mL, Intravenous, Once, Gareth Morgan, MD  Current Outpatient Medications:  .  albuterol (PROAIR HFA) 108 (90 Base) MCG/ACT inhaler, Inhale 2 puffs into the lungs every 6 (six) hours as needed for wheezing or shortness of breath., Disp: , Rfl:  .  amLODipine (NORVASC) 10 MG tablet, Take  10 mg by mouth daily., Disp: , Rfl: 2 .  aspirin 325 MG tablet, Take 325 mg by mouth daily., Disp: , Rfl:  .  atorvastatin (LIPITOR) 40 MG tablet, Take 1 tablet (40 mg total) by mouth daily., Disp: 30 tablet, Rfl: 1 .  CHANTIX STARTING MONTH PAK 0.5 MG X 11 & 1 MG X 42 tablet, USE AS DIRECTED ON BACK OF PACKAGE, Disp: , Rfl: 2 .  cyclobenzaprine (FLEXERIL) 10 MG tablet, Take 1 tablet (10 mg total) by mouth 3 (three) times daily  as needed for muscle spasms., Disp: 10 tablet, Rfl: 0 .  famotidine (PEPCID) 40 MG tablet, Take 1 tablet (40 mg total) by mouth at bedtime. (Patient taking differently: Take 40 mg by mouth daily. ), Disp: 30 tablet, Rfl: 1 .  folic acid (FOLVITE) 1 MG tablet, Take 1 tablet (1 mg total) by mouth daily., Disp: , Rfl:  .  gabapentin (NEURONTIN) 300 MG capsule, Take 300 mg by mouth daily after breakfast., Disp: , Rfl:  .  gabapentin (NEURONTIN) 600 MG tablet, Take 1 tablet (600 mg total) by mouth daily., Disp: , Rfl:  .  hydrOXYzine (VISTARIL) 50 MG capsule, Take one to two capsule daily at bed time for insomnia (Patient taking differently: Take 100 mg by mouth at bedtime. ), Disp: 45 capsule, Rfl: 1 .  Multiple Vitamin (MULTIVITAMIN WITH MINERALS) TABS tablet, Take 1 tablet by mouth daily., Disp: , Rfl:  .  oxyCODONE (ROXICODONE) 15 MG immediate release tablet, Take 1 tablet by mouth every 6 (six) hours. , Disp: , Rfl: 0 .  thiamine 100 MG tablet, Take 1 tablet (100 mg total) by mouth daily., Disp: , Rfl:  .  tiZANidine (ZANAFLEX) 2 MG tablet, Take 1 tablet (2 mg total) by mouth 3 (three) times daily., Disp: 90 tablet, Rfl: 1 .  venlafaxine XR (EFFEXOR-XR) 150 MG 24 hr capsule, Take 1 capsule (150 mg total) by mouth daily with breakfast., Disp: 30 capsule, Rfl: 1 .  Vitamin D, Ergocalciferol, (DRISDOL) 50000 units CAPS capsule, Take 50,000 Units by mouth every Saturday., Disp: , Rfl: 3   Exam: Current vital signs: BP 106/70   Pulse 78   Temp 98.4 F (36.9 C) (Oral)   Resp 10   Ht 5\' 4"  (1.626 m)   Wt 60 kg   SpO2 93%   BMI 22.71 kg/m  Vital signs in last 24 hours: Temp:  [98.4 F (36.9 C)] 98.4 F (36.9 C) (10/24 0953) Pulse Rate:  [76-89] 78 (10/24 1245) Resp:  [10-27] 10 (10/24 1245) BP: (105-121)/(64-93) 106/70 (10/24 1245) SpO2:  [89 %-97 %] 93 % (10/24 1245) Weight:  [60 kg] 60 kg (10/24 0953)  Physical Exam  Constitutional: Appears very fatigued, well-nourished  Psych:  appears frustrated  Eyes: ptosis of the right eye, negative for scleral injection,   HENT: Normocephalic, atraumatic, symmetrical, negative for exudates, trachea midline  Cardiovascular: s1, s2 noted, negative for gallop, right arm edema  Respiratory: labored breathing, bilateral lung sounds diminished upon ausculation, chest expansion symmetrical, negative for signs of cyanosis   GI: soft, round, non-tender, no guarding   Skin: Appropriate for ethnicity  Neuro: AO to person, place and situation   Mental Status: Patient is awake, alert, oriented to person, place and situation.  Patient is unable to give a clear and coherent history.  Patient has expressive aphasia, but does not have any signs of neglect.  Cranial Nerves: II: Visual Fields are full. Pupils are equal, round, and reactive to  light.  III,IV, VI: EOMI with ptosis of the right eye   V: facial sensation is symmetrical  VII: Facial movement is symmetric.  VIII: hearing is intact to voice X: Uvula elevates symmetrically XI: Shoulder shrug is symmetric. XII: tongue is midline without atrophy or fasciculations.  Motor: Tone is normal. Bulk is normal. 5/5 strength in left arm and left leg, 4/5 strength in right leg, 0/5 strength in right arm  Sensory: Sensation is symmetric to light touch and temperature in the arms and legs. Deep Tendon Reflexes: 2+ and symmetric in the left biceps and bilateral patellae. Mute in right arm.  Plantars: Toes are downgoing bilaterally.  Cerebellar: Patient was unable to perform finger-to-nose or heel-to-shin test   NIHSS:7   Labs I have reviewed labs in epic and the results pertinent to this consultation are:   CBC    Component Value Date/Time   WBC 7.5 07/13/2018 1007   RBC 4.49 07/13/2018 1007   HGB 13.6 07/13/2018 1036   HCT 40.0 07/13/2018 1036   PLT 313 07/13/2018 1007   MCV 91.8 07/13/2018 1007   MCH 27.6 07/13/2018 1007   MCHC 30.1 07/13/2018 1007   RDW 13.8 07/13/2018  1007   LYMPHSABS 2.0 07/13/2018 1007   MONOABS 0.6 07/13/2018 1007   EOSABS 0.2 07/13/2018 1007   BASOSABS 0.1 07/13/2018 1007    CMP     Component Value Date/Time   NA 135 07/13/2018 1036   K 5.2 (H) 07/13/2018 1036   CL 104 07/13/2018 1036   CO2 24 07/13/2018 1007   GLUCOSE 96 07/13/2018 1036   BUN 39 (H) 07/13/2018 1036   CREATININE 1.00 07/13/2018 1036   CREATININE 0.88 06/10/2017 1053   CALCIUM 9.1 07/13/2018 1007   PROT 7.2 07/13/2018 1007   ALBUMIN 3.9 07/13/2018 1007   AST 18 07/13/2018 1007   ALT 15 07/13/2018 1007   ALKPHOS 77 07/13/2018 1007   BILITOT 0.5 07/13/2018 1007   GFRNONAA 59 (L) 07/13/2018 1007   GFRNONAA 74 06/10/2017 1053   GFRAA >60 07/13/2018 1007   GFRAA 86 06/10/2017 1053    Lipid Panel     Component Value Date/Time   CHOL 146 03/03/2017 0351   TRIG 105 03/03/2017 0351   HDL 46 03/03/2017 0351   CHOLHDL 3.2 03/03/2017 0351   VLDL 21 03/03/2017 0351   LDLCALC 79 03/03/2017 0351     Imaging I have reviewed the images obtained:  CT HEAD WO CONTRAST: Multifocal supratentorial and infratentorial infarcts, more severe on the left than on the right, essentially stable compared to most recent study. No acute infarct is appreciable currently. It should be noted that a subtle infarct on the left side given the other changes could be easily obscured. No mass or hemorrhage is appreciable. There are foci of arterial vascular calcification. Mild mucosal thickening noted in several ethmoid air cells.   MRI examination of the brain: Four punctate foci of acute/early subacute infarction within left temporal lobe, left occipitotemporal junction, and the left parietal lobe. No associated hemorrhage or mass effect. Multiple stable chronic infarctions, microvascular ischemic changes, and volume loss of the brain.   07/13/2018,     Assessment: Mrs. Apsey is a 57 year old female presenting with right arm side weakness, numbness and pain further  complicated by her history of CVA with right-side residual weakness and right arm spastic hemiplegia. 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 Clinical manifestation of expressive aphasia,  right arm weakness and numbness is likely secondary to an acute infarct of the left temporal lobe as confirmed by MRI.        Recommendations: - Hemoglobin A1c -Echocardiogram -Plavis 75mg  daily  -Continue Aspirin p.o. 325mg  daily  - Continue statin therapy -monitor electrolytes and correct imbalances -Nephrology consult for AKI and hyperkalemia

## 2018-07-13 NOTE — ED Triage Notes (Signed)
Pt arrives via EMS from [facility] with complaints of R weakness, numbness and tingling for 2 days, reports aphasia. Hx of CVA with R side deficits.

## 2018-07-13 NOTE — ED Notes (Signed)
Patient transported to MRI 

## 2018-07-13 NOTE — Progress Notes (Addendum)
Pt admitted to 3W9 at this time.  Denies any pain.  Drowsy but easily arousable. Oriented to person, place, situation at present.  Disoriented to time.  Difficult to follow some commands. Coughing.  States she is a smoker and coughs daily.  Place on telemetry box 7.  CCMD called and second verification completed.  Pt has her cellphone with her; no other belongings.  No family at bedside at this time.

## 2018-07-13 NOTE — ED Provider Notes (Signed)
[facility] EMERGENCY DEPARTMENT Provider Note   CSN: [id number] Arrival date & time: 07/13/18  2952     History   Chief Complaint Chief Complaint  Patient presents with  . Aphasia  . Numbness    HPI Kelsey Wade is a 57 y.o. female.  HPI   57 year old female with a history of CVA with chronic right-sided weakness, hypertension, hyperlipidemia, anxiety, depression, alcohol abuse, who presents with concern for worsening right-sided weakness, difficulty speaking, and twitching of the right side.  Initial history is limited by patient's difficulty with speech, replies to different questions when asking. Symptoms have been going on for 2 days.  Right sided arm jerking present, severe last night per husband, continuing today.  She went to see physician who sent her to the ED for further evaluation.   No fevers, no head trauma.   Past Medical History:  Diagnosis Date  . Anxiety   . Arthritis   . Chronic back pain   . Depression   . ETOH abuse   . GERD (gastroesophageal reflux disease)   . HTN (hypertension)   . Hyperlipidemia   . Stroke [facility])    June 2018  . Tobacco abuse     Patient Active Problem List   Diagnosis Date Noted  . CVA (cerebral vascular accident) ([place]) 07/13/2018  . AKI (acute kidney injury) ([place]) 01/04/2018  . Toxic metabolic encephalopathy 84/13/2440  . Alcohol withdrawal ([place]) 01/04/2018  . Spastic hemiplegia affecting dominant side ([place]) 10/25/2017  . Left carotid artery occlusion   . CVA (cerebrovascular accident) ([place]) 03/02/2017  . Chronic pain 03/02/2017  . ETOH abuse 03/02/2017  . Ischemic stroke ([place])   . Hyperlipidemia LDL goal <100 11/15/2016  . Essential hypertension 11/12/2016  . Neuropathy 11/12/2016  . Tobacco dependence 11/12/2016    Past Surgical History:  Procedure Laterality Date  . APPENDECTOMY       OB History   None      Home Medications    Prior to Admission medications   Medication Sig  Start Date End Date Taking? Authorizing Provider  albuterol (PROAIR HFA) 108 (90 Base) MCG/ACT inhaler Inhale 2 puffs into the lungs every 6 (six) hours as needed for wheezing or shortness of breath.   Yes [provider]  amLODipine (NORVASC) 10 MG tablet Take 10 mg by mouth daily. 01/27/18  Yes [provider]  aspirin 325 MG tablet Take 325 mg by mouth daily.   Yes [provider]  atorvastatin (LIPITOR) 40 MG tablet Take 1 tablet (40 mg total) by mouth daily. 03/04/17  Yes Barton Dubois, MD  famotidine (PEPCID) 40 MG tablet Take 1 tablet (40 mg total) by mouth at bedtime. Patient taking differently: Take 40 mg by mouth daily.  10/03/17 10/03/18 Yes Dorena Dew, FNP  folic acid (FOLVITE) 1 MG tablet Take 1 tablet (1 mg total) by mouth daily. 01/07/18  Yes Samuella Cota, MD  gabapentin (NEURONTIN) 300 MG capsule Take 300 mg by mouth daily after breakfast.   Yes [provider]  hydrOXYzine (ATARAX/VISTARIL) 10 MG tablet Take 10 mg by mouth daily as needed for anxiety.   Yes [provider]  hydrOXYzine (VISTARIL) 25 MG capsule Take 25 mg by mouth at bedtime.   Yes [provider]  Multiple Vitamin (MULTIVITAMIN WITH MINERALS) TABS tablet Take 1 tablet by mouth daily. 01/07/18  Yes Samuella Cota, MD  oxyCODONE (ROXICODONE) 15 MG immediate release tablet Take 1 tablet by mouth every 6 (  six) hours.  03/06/18  Yes [provider]  thiamine 100 MG tablet Take 1 tablet (100 mg total) by mouth daily. 01/07/18  Yes Samuella Cota, MD  tiZANidine (ZANAFLEX) 2 MG tablet Take 1 tablet (2 mg total) by mouth 3 (three) times daily. 05/23/18  Yes Kirsteins, Luanna Salk, MD  venlafaxine XR (EFFEXOR-XR) 150 MG 24 hr capsule Take 1 capsule (150 mg total) by mouth daily with breakfast. 07/12/18 07/12/19 Yes Arfeen, Arlyce Harman, MD  Vitamin D, Ergocalciferol, (DRISDOL) 50000 units CAPS capsule Take 50,000 Units by mouth every Saturday. 04/17/18  Yes  [provider]  cyclobenzaprine (FLEXERIL) 10 MG tablet Take 1 tablet (10 mg total) by mouth 3 (three) times daily as needed for muscle spasms. Patient not taking: Reported on 07/13/2018 05/15/18   Little, Wenda Overland, MD  gabapentin (NEURONTIN) 600 MG tablet Take 1 tablet (600 mg total) by mouth daily. Patient not taking: Reported on 07/13/2018 01/06/18   Samuella Cota, MD  hydrOXYzine (VISTARIL) 50 MG capsule Take one to two capsule daily at bed time for insomnia Patient not taking: Reported on 07/13/2018 03/09/18   Kathlee Nations, MD    Family History Family History  Problem Relation Age of Onset  . Alcohol abuse Father   . Alcohol abuse Brother   . Breast cancer Maternal Aunt   . Colon cancer Neg Hx   . Esophageal cancer Neg Hx   . Stomach cancer Neg Hx   . Rectal cancer Neg Hx     Social History Social History   Tobacco Use  . Smoking status: Current Every Day Smoker    Packs/day: 0.50    Types: Cigarettes  . Smokeless tobacco: Never Used  . Tobacco comment: less  Substance Use Topics  . Alcohol use: Yes    Alcohol/week: 2.0 standard drinks    Types: 2 Cans of beer per week    Comment: occasionally   . Drug use: Yes    Types: Marijuana    Comment: quit 2 months ago 10-20-17     Allergies   Patient has no known allergies.   Review of Systems Review of Systems  Constitutional: Negative for fever.  HENT: Negative for sore throat.   Eyes: Negative for visual disturbance.  Respiratory: Negative for cough and shortness of breath.   Cardiovascular: Negative for chest pain.  Gastrointestinal: Negative for abdominal pain, nausea and vomiting.  Genitourinary: Negative for difficulty urinating.  Musculoskeletal: Negative for back pain and neck pain.  Skin: Negative for rash.  Neurological: Positive for speech difficulty and weakness. Negative for syncope, facial asymmetry, numbness and headaches. Seizures: jerking right arm.     Physical  Exam Updated Vital Signs BP 105/68   Pulse 76   Temp 98.4 F (36.9 C) (Oral)   Resp (!) 9   Ht 5\' 4"  (1.626 m)   Wt 60 kg   SpO2 96%   BMI 22.71 kg/m   Physical Exam  Constitutional: She appears well-developed and well-nourished. No distress.  HENT:  Head: Normocephalic and atraumatic.  Eyes: Conjunctivae and EOM are normal.  Neck: Normal range of motion.  Cardiovascular: Normal rate, regular rhythm, normal heart sounds and intact distal pulses. Exam reveals no gallop and no friction rub.  No murmur heard. Pulmonary/Chest: Effort normal and breath sounds normal. No respiratory distress. She has no wheezes. She has no rales.  Abdominal: Soft. She exhibits no distension. There is no tenderness. There is no guarding.  Musculoskeletal: She exhibits no edema  or tenderness.  Neurological: She is alert. No cranial nerve deficit or sensory deficit.  Spastic hemiplegia right upper extremity Frequent jerking of RUE Able to repeat "no ifs ands or buts" but is unable to answer many questions, nonsensical answers  Skin: Skin is warm and dry. No rash noted. She is not diaphoretic. No erythema.  Nursing note and vitals reviewed.    ED Treatments / Results  Labs (all labs ordered are listed, but only abnormal results are displayed) Labs Reviewed  COMPREHENSIVE METABOLIC PANEL - Abnormal; Notable for the following components:      Result Value   Sodium 134 (*)    Potassium 5.3 (*)    BUN 39 (*)    Creatinine, Ser 1.04 (*)    GFR calc non Af Amer 59 (*)    All other components within normal limits  RAPID URINE DRUG SCREEN, HOSP PERFORMED - Abnormal; Notable for the following components:   Opiates POSITIVE (*)    All other components within normal limits  I-STAT CHEM 8, ED - Abnormal; Notable for the following components:   Potassium 5.2 (*)    BUN 39 (*)    Calcium, Ion 1.11 (*)    All other components within normal limits  CBC WITH DIFFERENTIAL/PLATELET  ETHANOL  PROTIME-INR   APTT  URINALYSIS, ROUTINE W REFLEX MICROSCOPIC  HIV ANTIBODY (ROUTINE TESTING W REFLEX)  HEMOGLOBIN A1C  LIPID PANEL  CBC  CREATININE, SERUM  I-STAT TROPONIN, ED  I-STAT BETA HCG BLOOD, ED ([facility], [facility], [facility] ONLY)    EKG None  Radiology Ct Head Wo Contrast  Result Date: 07/13/2018 CLINICAL DATA:  Right-sided weakness and numbness.  Aphasia EXAM: CT HEAD WITHOUT CONTRAST TECHNIQUE: Contiguous axial images were obtained from the base of the skull through the vertex without intravenous contrast. COMPARISON:  December 22, 2017 FINDINGS: Brain: The ventricles are normal in size and configuration. There is no intracranial mass, hemorrhage, extra-axial fluid collection, or midline shift. There is a prior infarct involving the superior left frontal lobe as well as portions of the left parietal lobe, stable. There is a prior infarct in the superomedial left occipital lobe. There is evidence of a prior infarct in the superior mid to posterior right frontal lobe as well as in a portion of the posterior right parietal lobe. There is evidence of a prior white matter infarct in the posterior right centrum semiovale. There are prior small infarcts in the posterior mid right cerebellum and in the mid cerebellar regions bilaterally. There is a prior small infarct in the anterior left centrum semiovale adjacent to the frontal horn of the left lateral ventricle. No convincing acute infarct is evident on the current examination. Vascular: There is no appreciable hyperdense vessel. There is calcification in each carotid siphon region. Skull: The bony calvarium appears intact. Sinuses/Orbits: There is mucosal thickening in several ethmoid air cells. Other paranasal sinuses are clear. Orbits appear symmetric bilaterally. Other: Mastoid air cells are clear. IMPRESSION: 1. Multifocal supratentorial and infratentorial infarcts, more severe on the left than on the right, essentially stable compared to most recent study. No acute  infarct is appreciable currently. It should be noted that a subtle infarct on the left side given the other changes could be easily obscured. In this regard, it may be reasonable to consider MR with diffusion imaging to further assess. 2.  No mass or hemorrhage is appreciable. 3.  There are foci of arterial vascular calcification. 4.  Mild mucosal thickening noted in several ethmoid air  cells. Electronically Signed   By: Lowella Grip III M.D.   On: 07/13/2018 12:15   Mr Brain Wo Contrast  Result Date: 07/13/2018 CLINICAL DATA:  57 y/o F; right-sided weakness, numbness, and tingling for 2 days as well as aphasia. History of CVA with right-sided deficits. EXAM: MRI HEAD WITHOUT CONTRAST TECHNIQUE: Multiplanar, multiecho pulse sequences of the brain and surrounding structures were obtained without intravenous contrast. COMPARISON:  07/13/2018 CT head.  03/02/2017 MRI head. FINDINGS: Brain: 2 punctate foci of reduced diffusion compatible with acute/early subacute infarction within the left temporal lobe, additional focus within the left occipitotemporal junction, and additional focus within the left medial parietal cortex (series 5, image 51, 52, 66). Several stable very small chronic infarctions within the bilateral cerebellar hemispheres, multiple cortical infarctions in left cerebral hemisphere in a watershed distribution, and additional small cortical infarctions within the right frontal and parietal lobes. Chronic microvascular ischemic changes of white matter and volume loss of the brain are stable. 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 Vascular: Chronic left ICA occlusion. Skull and upper cervical spine: Normal marrow signal. Sinuses/Orbits: Negative. Other: None. IMPRESSION: 1. Four punctate foci  of acute/early subacute infarction within left temporal lobe, left occipitotemporal junction, and the left parietal lobe. No associated hemorrhage or mass effect. 2. Multiple stable chronic infarctions, microvascular ischemic changes, and volume loss of the brain. These results were called by telephone at the time of interpretation on 07/13/2018 at 2:51 pm to Dr. Gareth Morgan , who verbally acknowledged these results. Electronically Signed   By: Kristine Garbe M.D.   On: 07/13/2018 14:52    Procedures .Critical Care Performed by: Gareth Morgan, MD Authorized by: Gareth Morgan, MD   Critical care provider statement:    Critical care time (minutes):  30   Critical care was necessary to treat or prevent imminent or life-threatening deterioration of the following conditions:  CNS failure or compromise   Critical care was time spent personally by me on the following activities:  Review of old charts, re-evaluation of patient's condition, discussions with consultants, examination of patient, ordering and review of radiographic studies and ordering and review of laboratory studies   (including critical care time)  Medications Ordered in ED Medications  aspirin tablet 325 mg (has no administration in time range)  oxyCODONE (Oxy IR/ROXICODONE) immediate release tablet 15 mg (has no administration in time range)  atorvastatin (LIPITOR) tablet 40 mg (has no administration in time range)  venlafaxine XR (EFFEXOR-XR) 24 hr capsule 150 mg (has no administration in time range)  famotidine (PEPCID) tablet 40 mg (has no administration in time range)  cyclobenzaprine (FLEXERIL) tablet 10 mg (has no administration in time range)  gabapentin (NEURONTIN) capsule 300 mg (has no administration in time range)  Vitamin D (Ergocalciferol) (DRISDOL) capsule 50,000 Units (has no administration in time range)  tiZANidine (ZANAFLEX) tablet 2 mg (has no administration in time range)   stroke: mapping  our early stages of recovery book (has no administration in time range)  0.9 %  sodium chloride infusion (has no administration in time range)  acetaminophen (TYLENOL) tablet 650 mg (has no administration in time range)    Or  acetaminophen (TYLENOL) solution 650 mg (has no administration in time range)    Or  acetaminophen (TYLENOL) suppository 650 mg (has no administration in time range)  senna-docusate (  Senokot-S) tablet 1 tablet (has no administration in time range)  enoxaparin (LOVENOX) injection 40 mg (has no administration in time range)  albuterol (PROVENTIL) (2.5 MG/3ML) 0.083% nebulizer solution 2.5 mg (has no administration in time range)  clopidogrel (PLAVIX) tablet 75 mg (has no administration in time range)  levETIRAcetam (KEPPRA) IVPB 500 mg/100 mL premix (has no administration in time range)  LORazepam (ATIVAN) tablet 1 mg (has no administration in time range)    Or  LORazepam (ATIVAN) injection 1 mg (has no administration in time range)  thiamine (VITAMIN B-1) tablet 100 mg (has no administration in time range)    Or  thiamine (B-1) injection 100 mg (has no administration in time range)  folic acid (FOLVITE) tablet 1 mg (has no administration in time range)  multivitamin with minerals tablet 1 tablet (has no administration in time range)  LORazepam (ATIVAN) injection 1 mg (1 mg Intravenous Given 07/13/18 1157)  levETIRAcetam (KEPPRA) IVPB 1000 mg/100 mL premix (0 mg Intravenous Stopped 07/13/18 1224)     Initial Impression / Assessment and Plan / ED Course  I have reviewed the triage vital signs and the nursing notes.  Pertinent labs & imaging results that were available during my care of the patient were reviewed by me and considered in my medical decision making (see chart for details).     57 year old female with a history of CVA with chronic right-sided weakness, hypertension, hyperlipidemia, anxiety, depression, alcohol abuse, who presents with concern for  worsening right-sided weakness, difficulty speaking, and twitching of the right upper extremity.  Placed consult to Neurology and ordered CT, labs for further evaluation of new CVA, versus reactivation, vs focal seizure or other medication related myoclonus.   Dr. Lorraine Lax came to bedside and evaluated the patient. Feels movements may represent partial seizures, and will trial ativan and keppra.  On reevaluation, patient no longer with shaking, is somewhat sleepy from ativan.  MRI returned showing new areas of infarction. Will admit for further care.   Final Clinical Impressions(s) / ED Diagnoses   Final diagnoses:  Right arm weakness  Partial seizures ([place])  Cerebrovascular accident (CVA) due to embolism of left middle cerebral artery [facility])    ED Discharge Orders    None       Gareth Morgan, MD 07/13/18 1749

## 2018-07-13 NOTE — H&P (Addendum)
HISTORY AND PHYSICAL       PATIENT DETAILS Name: Kelsey Wade Age: 57 y.o. Sex: female Date of Birth: [birth year] Admit Date: 07/13/2018 IRW:[id number], QPYPPJ, FNP   Patient coming from: Home   CHIEF COMPLAINT:  Aphasia, confusion, worsening "jerky" movement of the right upper extremity  HPI: Kelsey Wade is a 57 y.o. female with medical history significant of prior CVA with significant right-sided spastic hemiplegia, chronic left carotid artery occlusion, ongoing tobacco use (1.5 packs a day), alcohol use, dyslipidemia, hypertension referred from [facility] for evaluation of the above-noted complaints.  Per family-they have noticed shaking/jerking movements of the right upper extremity intermittently for the past few months.  Today at [facility] appeared to have expressive aphasia and was confused.  She appeared to have more worsening jerking movement of the right arm.  She was referred to the emergency room, where a MRI of the brain did show small infarcts.  She was evaluated by neurology-given IV Keppra following which the hospitalist service was asked to admit this patient for further evaluation and treatment.  Patient denies any headache, chest pain, shortness of breath.  There is no history of nausea, vomiting or diarrhea.  ED Course:  CT head did not show any acute abnormalities, MRI brain showed foci of acute/subacute infarction on the left temporal lobe, left occipitotemporal junction in the left parietal lobe.  Patient was given IV Keppra-neurology was consulted and hospitalist service was asked to admit this patient.  Note: Lives at: Home Mobility: Cane/Walker Chronic Indwelling Foley:yno   REVIEW OF SYSTEMS:  Constitutional:   No  weight loss, night sweats,  Fevers, chills, fatigue.  HEENT:    No headaches, Dysphagia,Tooth/dental problems,Sore throat  Cardio-vascular: No chest pain,Orthopnea, PND,lower extremity  edema, anasarca, palpitations  GI:  No heartburn, indigestion, abdominal pain, nausea, vomiting, diarrhea, melena or hematochezia  Resp: No shortness of breath, cough, hemoptysis,plueritic chest pain.   Skin:  No rash or lesions.  GU:  No dysuria, change in color of urine, no urgency or frequency.  No flank pain.  Musculoskeletal: No joint pain or swelling.  No decreased range of motion.  No back pain.  Endocrine: No heat intolerance, no cold intolerance, no polyuria, no polydipsia  Psych: No change in mood or affect. No depression or anxiety.  No memory loss.   ALLERGIES:  No Known Allergies  PAST MEDICAL HISTORY: Past Medical History:  Diagnosis Date  . Anxiety   . Arthritis   . Chronic back pain   . Depression   . ETOH abuse   . GERD (gastroesophageal reflux disease)   . HTN (hypertension)   . Hyperlipidemia   . Stroke [facility])    June 2018  . Tobacco abuse     PAST SURGICAL HISTORY: Past Surgical History:  Procedure Laterality Date  . APPENDECTOMY      MEDICATIONS AT HOME: Prior to Admission medications   Medication Sig Start Date End Date Taking? Authorizing Provider  albuterol (PROAIR HFA) 108 (90 Base) MCG/ACT inhaler Inhale 2 puffs into the lungs every 6 (six) hours as needed for wheezing or shortness of breath.    [provider]  amLODipine (NORVASC) 10 MG tablet Take 10 mg by mouth daily. 01/27/18   [provider]  aspirin 325 MG tablet Take 325 mg by mouth daily.    [provider]  atorvastatin (LIPITOR) 40 MG tablet Take 1 tablet (40 mg total) by mouth daily. 03/04/17  Barton Dubois, MD  CHANTIX STARTING MONTH PAK 0.5 MG X 11 & 1 MG X 42 tablet USE AS DIRECTED ON BACK OF PACKAGE 04/17/18   [provider]  cyclobenzaprine (FLEXERIL) 10 MG tablet Take 1 tablet (10 mg total) by mouth 3 (three) times daily as needed for muscle spasms. 05/15/18   Little, Wenda Overland, MD  famotidine (PEPCID) 40 MG tablet Take 1  tablet (40 mg total) by mouth at bedtime. Patient taking differently: Take 40 mg by mouth daily.  10/03/17 10/03/18  Dorena Dew, FNP  folic acid (FOLVITE) 1 MG tablet Take 1 tablet (1 mg total) by mouth daily. 01/07/18   Samuella Cota, MD  gabapentin (NEURONTIN) 300 MG capsule Take 300 mg by mouth daily after breakfast.    [provider]  gabapentin (NEURONTIN) 600 MG tablet Take 1 tablet (600 mg total) by mouth daily. 01/06/18   Samuella Cota, MD  hydrOXYzine (VISTARIL) 50 MG capsule Take one to two capsule daily at bed time for insomnia Patient taking differently: Take 100 mg by mouth at bedtime.  03/09/18   Arfeen, Arlyce Harman, MD  Multiple Vitamin (MULTIVITAMIN WITH MINERALS) TABS tablet Take 1 tablet by mouth daily. 01/07/18   Samuella Cota, MD  oxyCODONE (ROXICODONE) 15 MG immediate release tablet Take 1 tablet by mouth every 6 (six) hours.  03/06/18   [provider]  thiamine 100 MG tablet Take 1 tablet (100 mg total) by mouth daily. 01/07/18   Samuella Cota, MD  tiZANidine (ZANAFLEX) 2 MG tablet Take 1 tablet (2 mg total) by mouth 3 (three) times daily. 05/23/18   Charlett Blake, MD  venlafaxine XR (EFFEXOR-XR) 150 MG 24 hr capsule Take 1 capsule (150 mg total) by mouth daily with breakfast. 07/12/18 07/12/19  Arfeen, Arlyce Harman, MD  Vitamin D, Ergocalciferol, (DRISDOL) 50000 units CAPS capsule Take 50,000 Units by mouth every Saturday. 04/17/18   [provider]    FAMILY HISTORY: Family History  Problem Relation Age of Onset  . Alcohol abuse Father   . Alcohol abuse Brother   . Breast cancer Maternal Aunt   . Colon cancer Neg Hx   . Esophageal cancer Neg Hx   . Stomach cancer Neg Hx   . Rectal cancer Neg Hx     SOCIAL HISTORY:  reports that she has been smoking cigarettes. She has been smoking about 0.50 packs per day. She has never used smokeless tobacco. She reports that she drinks about 2.0 standard drinks of alcohol per week. She  reports that she has current or past drug history. Drug: Marijuana.  PHYSICAL EXAM: Blood pressure 106/70, pulse 78, temperature 98.4 F (36.9 C), temperature source Oral, resp. rate 10, height 5\' 4"  (1.626 m), weight 60 kg, SpO2 93 %.  General appearance :Awake, alert, not in any distress.  Speech somewhat clear-seems to have improved.  Do not see any dysarthria or aphasia at this time. Eyes:, pupils equally reactive to light and accomodation,no scleral icterus.Pink conjunctiva HEENT: Atraumatic and Normocephalic Neck: supple, no JVD.  Resp:Good air entry bilaterally, no added sounds  CVS: S1 S2 regular, no murmurs.  GI: Bowel sounds present, Non tender and not distended with no gaurding, rigidity or rebound.No organomegaly Extremities: B/L Lower Ext shows no edema, both legs are warm to touch Neurology: Spastic right-sided hemiplegia-but left upper and lower extremity appear to be intact.  Musculoskeletal:gait appears to be normal.No digital cyanosis Skin:No Rash, warm and dry Wounds:N/A  LABS ON  ADMISSION:  I have personally reviewed following labs and imaging studies  CBC: Recent Labs  Lab 07/13/18 1007 07/13/18 1036  WBC 7.5  --   NEUTROABS 4.6  --   HGB 12.4 13.6  HCT 41.2 40.0  MCV 91.8  --   PLT 313  --     Basic Metabolic Panel: Recent Labs  Lab 07/13/18 1007 07/13/18 1036  NA 134* 135  K 5.3* 5.2*  CL 104 104  CO2 24  --   GLUCOSE 98 96  BUN 39* 39*  CREATININE 1.04* 1.00  CALCIUM 9.1  --     GFR: Estimated Creatinine Clearance: 54.2 mL/min (by C-G formula based on SCr of 1 mg/dL).  Liver Function Tests: Recent Labs  Lab 07/13/18 1007  AST 18  ALT 15  ALKPHOS 77  BILITOT 0.5  PROT 7.2  ALBUMIN 3.9   No results for input(s): LIPASE, AMYLASE in the last 168 hours. No results for input(s): AMMONIA in the last 168 hours.  Coagulation Profile: Recent Labs  Lab 07/13/18 1007  INR 1.01    Cardiac Enzymes: No results for input(s): CKTOTAL,  CKMB, CKMBINDEX, TROPONINI in the last 168 hours.  BNP (last 3 results) No results for input(s): PROBNP in the last 8760 hours.  HbA1C: No results for input(s): HGBA1C in the last 72 hours.  CBG: No results for input(s): GLUCAP in the last 168 hours.  Lipid Profile: No results for input(s): CHOL, HDL, LDLCALC, TRIG, CHOLHDL, LDLDIRECT in the last 72 hours.  Thyroid Function Tests: No results for input(s): TSH, T4TOTAL, FREET4, T3FREE, THYROIDAB in the last 72 hours.  Anemia Panel: No results for input(s): VITAMINB12, FOLATE, FERRITIN, TIBC, IRON, RETICCTPCT in the last 72 hours.  Urine analysis:    Component Value Date/Time   COLORURINE YELLOW 07/13/2018 1007   APPEARANCEUR CLEAR 07/13/2018 1007   LABSPEC 1.014 07/13/2018 1007   PHURINE 5.0 07/13/2018 1007   GLUCOSEU NEGATIVE 07/13/2018 1007   HGBUR NEGATIVE 07/13/2018 1007   BILIRUBINUR NEGATIVE 07/13/2018 1007   KETONESUR NEGATIVE 07/13/2018 1007   PROTEINUR NEGATIVE 07/13/2018 1007   UROBILINOGEN 0.2 08/10/2017 1059   NITRITE NEGATIVE 07/13/2018 1007   LEUKOCYTESUR NEGATIVE 07/13/2018 1007    Sepsis Labs: Lactic Acid, Venous    Component Value Date/Time   LATICACIDVEN 0.60 01/01/2018 2054     Microbiology: No results found for this or any previous visit (from the past 240 hour(s)).    RADIOLOGIC STUDIES ON ADMISSION: Ct Head Wo Contrast  Result Date: 07/13/2018 CLINICAL DATA:  Right-sided weakness and numbness.  Aphasia EXAM: CT HEAD WITHOUT CONTRAST TECHNIQUE: Contiguous axial images were obtained from the base of the skull through the vertex without intravenous contrast. COMPARISON:  December 22, 2017 FINDINGS: Brain: The ventricles are normal in size and configuration. There is no intracranial mass, hemorrhage, extra-axial fluid collection, or midline shift. There is a prior infarct involving the superior left frontal lobe as well as portions of the left parietal lobe, stable. There is a prior infarct in the  superomedial left occipital lobe. There is evidence of a prior infarct in the superior mid to posterior right frontal lobe as well as in a portion of the posterior right parietal lobe. There is evidence of a prior white matter infarct in the posterior right centrum semiovale. There are prior small infarcts in the posterior mid right cerebellum and in the mid cerebellar regions bilaterally. There is a prior small infarct in the anterior left centrum semiovale adjacent to the frontal  horn of the left lateral ventricle. No convincing acute infarct is evident on the current examination. Vascular: There is no appreciable hyperdense vessel. There is calcification in each carotid siphon region. Skull: The bony calvarium appears intact. Sinuses/Orbits: There is mucosal thickening in several ethmoid air cells. Other paranasal sinuses are clear. Orbits appear symmetric bilaterally. Other: Mastoid air cells are clear. IMPRESSION: 1. Multifocal supratentorial and infratentorial infarcts, more severe on the left than on the right, essentially stable compared to most recent study. No acute infarct is appreciable currently. It should be noted that a subtle infarct on the left side given the other changes could be easily obscured. In this regard, it may be reasonable to consider MR with diffusion imaging to further assess. 2.  No mass or hemorrhage is appreciable. 3.  There are foci of arterial vascular calcification. 4.  Mild mucosal thickening noted in several ethmoid air cells. Electronically Signed   By: Lowella Grip III M.D.   On: 07/13/2018 12:15   Mr Brain Wo Contrast  Result Date: 07/13/2018 CLINICAL DATA:  57 y/o F; right-sided weakness, numbness, and tingling for 2 days as well as aphasia. History of CVA with right-sided deficits. EXAM: MRI HEAD WITHOUT CONTRAST TECHNIQUE: Multiplanar, multiecho pulse sequences of the brain and surrounding structures were obtained without intravenous contrast. COMPARISON:   07/13/2018 CT head.  03/02/2017 MRI head. FINDINGS: Brain: 2 punctate foci of reduced diffusion compatible with acute/early subacute infarction within the left temporal lobe, additional focus within the left occipitotemporal junction, and additional focus within the left medial parietal cortex (series 5, image 51, 52, 66). Several stable very small chronic infarctions within the bilateral cerebellar hemispheres, multiple cortical infarctions in left cerebral hemisphere in a watershed distribution, and additional small cortical infarctions within the right frontal and parietal lobes. Chronic microvascular ischemic changes of white matter and volume loss of the brain are stable. 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 Vascular: Chronic left ICA occlusion. Skull and upper cervical spine: Normal marrow signal. Sinuses/Orbits: Negative. Other: None. IMPRESSION: 1. Four punctate foci of acute/early subacute infarction within left temporal lobe, left occipitotemporal junction, and the left parietal lobe. No associated hemorrhage or mass effect. 2. Multiple stable chronic infarctions, microvascular ischemic changes, and volume loss of the brain. These results were called by telephone at the time of interpretation on 07/13/2018 at 2:51 pm to Dr. Gareth Morgan , who verbally acknowledged these results. Electronically Signed   By: Kristine Garbe M.D.   On: 07/13/2018 14:52    EKG:  Personally reviewed.  Normal sinus rhythm  ASSESSMENT AND PLAN: Acute CVA: Appears to be thromboembolic-suspect emboli from left ICA occlusion.  Spoke with neurology-continue aspirin-add Plavix.  Continue statin.  Obtaining echo, CT angiogram of the head and neck.  Will await further recommendations from neurology  Partial  Seizure disorder: Likely jerking movement affecting right upper extremity is reflective of seizures.  EEG ordered-neurology recommending to continue Keppra.  Follow further recommendations from neurology.  Patient definitely has seizure foci due to history of CVA.  Hypertension: Allow her massive hypertension in the setting of chronic ICA occlusion.  Dyslipidemia: Continue statin-check fasting lipids.  Chronic pain syndrome: Patient has chronic back pain-continue oxycodone.  Tobacco dependence: Smokes 1.5 packs of cigarettes daily-counseled.  Alcohol abuse: Drinks 2-3 beers on a daily basis-no signs of withdrawal at this  time-monitor with Ativan per protocol.  Further plan will depend as patient's clinical course evolves and further radiologic and laboratory data become available. Patient will be monitored closely.  Above noted plan was discussed with patient/family face to face at bedside, they were in agreement.   CONSULTS: None  DVT Prophylaxis: Prophylactic Lovenox  Code Status: Full Code  Disposition Plan:  Discharge back home vs SNF possibly in 2 days, depending on clinical course  Admission status: Inpatient going to tele/  The medical decision making on this patient was of high complexity and the patient is at high risk for clinical deterioration, therefore this is a level 3 visit.   Total time spent  55 minutes.Greater than 50% of this time was spent in counseling, explanation of diagnosis, planning of further management, and coordination of care.  Severity of Illness: The appropriate patient status for this patient is INPATIENT. Inpatient status is judged to be reasonable and necessary in order to provide the required intensity of service to ensure the patient's safety. The patient's presenting symptoms, physical exam findings, and initial radiographic and laboratory data in the context of their chronic comorbidities is felt to place them at high risk for further clinical  deterioration. Furthermore, it is not anticipated that the patient will be medically stable for discharge from the hospital within 2 midnights of admission. The following factors support the patient status of inpatient.   " The patient's presenting symptoms include aphasia, seizures " The worrisome physical exam findings include aphasia, seizures " The initial radiographic and laboratory data are worrisome because of multiple infarcts on MRI brain " The chronic co-morbidities include right CVA, alcohol use, tobacco use, hypertension  * I certify that at the point of admission it is my clinical judgment that the patient will require inpatient hospital care spanning beyond 2 midnights from the point of admission due to high intensity of service, high risk for further deterioration and high frequency of surveillance required.**  Shanker Ghimire [facility] Pager [phone]  If 7PM-7AM, please contact night-coverage [email or web address] Password [email or web address] 07/13/2018, 4:16 PM

## 2018-07-14 ENCOUNTER — Inpatient Hospital Stay (HOSPITAL_COMMUNITY): Payer: Medicaid Other

## 2018-07-14 DIAGNOSIS — F1010 Alcohol abuse, uncomplicated: Secondary | ICD-10-CM

## 2018-07-14 DIAGNOSIS — R292 Abnormal reflex: Secondary | ICD-10-CM

## 2018-07-14 DIAGNOSIS — R29898 Other symptoms and signs involving the musculoskeletal system: Secondary | ICD-10-CM

## 2018-07-14 DIAGNOSIS — F17200 Nicotine dependence, unspecified, uncomplicated: Secondary | ICD-10-CM

## 2018-07-14 DIAGNOSIS — Z8673 Personal history of transient ischemic attack (TIA), and cerebral infarction without residual deficits: Secondary | ICD-10-CM

## 2018-07-14 DIAGNOSIS — I63232 Cerebral infarction due to unspecified occlusion or stenosis of left carotid arteries: Secondary | ICD-10-CM

## 2018-07-14 DIAGNOSIS — I639 Cerebral infarction, unspecified: Secondary | ICD-10-CM

## 2018-07-14 DIAGNOSIS — I6522 Occlusion and stenosis of left carotid artery: Secondary | ICD-10-CM

## 2018-07-14 LAB — LIPID PANEL
CHOL/HDL RATIO: 3.6 ratio
CHOLESTEROL: 167 mg/dL (ref 0–200)
HDL: 46 mg/dL (ref >40)
LDL Cholesterol: 98 mg/dL (ref 0–99)
TRIGLYCERIDES: 113 mg/dL (ref <150)
VLDL: 23 mg/dL (ref 0–40)

## 2018-07-14 LAB — ECHOCARDIOGRAM COMPLETE
Height: 64 in
Weight: 2116.42 oz

## 2018-07-14 LAB — HIV ANTIBODY (ROUTINE TESTING W REFLEX): HIV SCREEN 4TH GENERATION: NONREACTIVE

## 2018-07-14 LAB — HEMOGLOBIN A1C
HEMOGLOBIN A1C: 5.9 % — AB (ref 4.8–5.6)
MEAN PLASMA GLUCOSE: 122.63 mg/dL

## 2018-07-14 LAB — VITAMIN B12: Vitamin B-12: 3215 pg/mL — ABNORMAL HIGH (ref 180–914)

## 2018-07-14 LAB — TSH: TSH: 0.432 u[IU]/mL (ref 0.350–4.500)

## 2018-07-14 MED ORDER — BISACODYL 10 MG RE SUPP: 10.0000 mg | Freq: Every day | RECTAL | Status: DC | PRN

## 2018-07-14 MED ORDER — SENNOSIDES-DOCUSATE SODIUM 8.6-50 MG PO TABS: 1.0000 | ORAL_TABLET | Freq: Every evening | ORAL | Status: DC | PRN

## 2018-07-14 MED ORDER — IOPAMIDOL (ISOVUE-370) INJECTION 76%
50.0000 mL | Freq: Once | INTRAVENOUS | Status: AC | PRN
  Administered 2018-07-14: 50 mL via INTRAVENOUS

## 2018-07-14 MED ORDER — POLYETHYLENE GLYCOL 3350 17 G PO PACK
17.0000 g | PACK | Freq: Every day | ORAL | Status: DC
  Administered 2018-07-14 – 2018-07-15 (×2): 17 g via ORAL
  Filled 2018-07-14 (×3): qty 1

## 2018-07-14 MED ORDER — LEVETIRACETAM 500 MG PO TABS
500.0000 mg | ORAL_TABLET | Freq: Two times a day (BID) | ORAL | Status: DC
  Administered 2018-07-14 – 2018-07-16 (×4): 500 mg via ORAL
  Filled 2018-07-14 (×4): qty 1

## 2018-07-14 NOTE — Evaluation (Signed)
Clinical/Bedside Swallow Evaluation Patient Details  Name: Kelsey Wade MRN: [id number] Date of Birth: [birth year]  Today's Date: 07/14/2018 Time: SLP Start Time (ACUTE ONLY): 0830 SLP Stop Time (ACUTE ONLY): 0850 SLP Time Calculation (min) (ACUTE ONLY): 20 min  Past Medical History:  Past Medical History:  Diagnosis Date  . Anxiety   . Arthritis   . Chronic back pain   . Depression   . ETOH abuse   . GERD (gastroesophageal reflux disease)   . HTN (hypertension)   . Hyperlipidemia   . Stroke [facility])    June 2018  . Tobacco abuse    Past Surgical History:  Past Surgical History:  Procedure Laterality Date  . APPENDECTOMY     HPI:  57 y.o. 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 Four punctate foci of acute/early subacute infarction within left temporal lobe, left occipitotemporal junction, and the left parietal lobe.    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 Pt is fluent and can repeat and read, but comprehension is almost absent. There is mild visual agnosia noted with self feeding. Expressive language is only minimally accurate and otherwise characterized by jargon and paraphasias. Pt will need SLP interventions acutely and at next level of care, recommend CIR consult. Will continue to follow for functional communication.  SLP Visit Diagnosis: Aphasia (R47.01)    Aspiration Risk  Mild aspiration risk    Diet Recommendation Regular;Thin liquid   Medication Administration: Whole meds with liquid Supervision: Patient able to self feed    Other  Recommendations     Follow up Recommendations Inpatient Rehab      Frequency and  Duration min 2x/week          Prognosis        Swallow Study   General HPI: 57 y.o. 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 Four punctate foci of acute/early subacute infarction within left temporal lobe, left occipitotemporal junction, and the left parietal lobe.  Type of Study: Bedside Swallow Evaluation Diet Prior to this Study: NPO Temperature Spikes Noted: No Respiratory Status: Room air History of Recent Intubation: No Behavior/Cognition: Alert;Cooperative;Pleasant mood Oral Cavity Assessment: Within Functional Limits Oral Care Completed by SLP: No Oral Cavity - Dentition: Adequate natural dentition Vision: Functional for self-feeding Self-Feeding Abilities: Able to feed self Patient Positioning: Upright in bed Baseline Vocal Quality: Normal Volitional Cough: Strong Volitional Swallow: Able to elicit    Oral/Motor/Sensory Function Overall Oral Motor/Sensory Function: Within functional limits   Ice Chips     Thin Liquid Thin Liquid: Within functional limits    Nectar Thick Nectar Thick Liquid: Not tested   Honey Thick Honey Thick Liquid: Not tested   Puree Puree: Within functional limits   Solid     Solid: Within functional limits     Herbie [place], MA [place] Pager [phone] Office [phone]  Othelia Pulling, Katherene Ponto 07/14/2018,2:14 PM

## 2018-07-14 NOTE — Procedures (Signed)
Date of recording10/25/2019  Referring physician Oren Binet  Reason for the study seizure  Technical Digital EEG recording using 10-20 international electrode system  Description of the recording When awake posterior dominant rhythm is6-7Hz  symmetrical reactive Non REM stage II sleepwas obtained During drowsiness and sleep, occasional generalized rhythmic delta activity seen. Epileptiform features were not seen during this recording  Impression The EEG isabnormal and findings are suggestive of mild to moderate generalized cerebral dysfunction

## 2018-07-14 NOTE — Progress Notes (Signed)
Responded to IV consult. RN states PIV already obtained and consult no longer needed.

## 2018-07-14 NOTE — Progress Notes (Signed)
PROGRESS NOTE    Kelsey Wade  IEP:[id number] DOB: 1960-11-08 DOA: 07/13/2018 PCP: Elenore Paddy, FNP   Brief Narrative:  57 year old with a history of previous CVA with right-sided spastic hemiplegia, active tobacco use, alcohol use, hyperlipidemia, chronic left-sided carotid artery occlusion, essential hypertension was brought to the hospital for evaluation due to right upper extremity weakness and jerking.  MRI of the brain showed acute/subacute left temporal lobe, left occipitotemporal junction and left temporal lobe infarct.  Due to concerns of some shaking patient was also started on IV Keppra.  Neurology was consulted.   Assessment & Plan:   Principal Problem:   CVA (cerebral vascular accident) ([place]) Active Problems:   Essential hypertension   Tobacco dependence   Hyperlipidemia LDL goal <100   Chronic pain   ETOH abuse  Acute CVA with left temporal lobe infarct - Suspect this is from emboli or hypoperfusion from left ICA occlusion. - MRI of the brain is positive for acute/subacute left temporal lobe infarct -CTA of the head and neck- chronically occluded left ICA 50% on the right chronically occluded left vertebral artery.  Advanced intracranial atherosclerosis -Continue aspirin and Plavix for at least 3 months followed by single agent - Speech and swallow evaluation-pending -PT/OT evaluation-pending -Check orthostatic vital signs, IV fluids if necessary - Hemoglobin A1c 5.9, LDL is 98 - Folate, vitamin B12 and TSH is pending  Partial seizure disorder - Possibly related to TIA, spoke with neurology.  For now continue Keppra.  Essential hypertension - Currently on hold due to permissive hypertension in the setting of hypoperfusion.  Hyperlipidemia -Continue statin-atorvastatin 40 mg daily  Chronic pain - Continue home regimen of oxycodone 15 mg every 6 hours.  Gabapentin 300 mg daily, Zanaflex 2 mg 3 times daily - Bowel regimen added  History of  depression -Continue Effexor  Alcohol use and tobacco use -Nicotine patch if needed.  Alcohol withdrawal protocol in place. -Counseled to quit using alcohol and tobacco. -Vitamins started  DVT prophylaxis: Lovenox Code Status: Full code Family Communication: Husband and uncle at bedside Disposition Plan: Maintain inpatient stay for further evaluation in the setting of acute stroke.  Consultants:   Neurology  Procedures:   None  Antimicrobials:   None   Subjective: Still continues to feel extremely weak requiring lots of help moving from bed to commode.  Continues to have slurred speech along with bilateral upper extremity weakness right more than left.  Barely able to bear weight on her legs bilaterally.  Review of Systems Otherwise negative except as per HPI, including: General: Denies fever, chills, night sweats or unintended weight loss. Resp: Denies cough, wheezing, shortness of breath. Cardiac: Denies chest pain, palpitations, orthopnea, paroxysmal nocturnal dyspnea. GI: Denies abdominal pain, nausea, vomiting, diarrhea or constipation GU: Denies dysuria, frequency, hesitancy or incontinence MS: Denies muscle aches, joint pain or swelling Neuro: Denies headache, neurologic deficits (focal weakness, numbness, tingling), abnormal gait Psych: Denies anxiety, depression, SI/HI/AVH Skin: Denies new rashes or lesions ID: Denies sick contacts, exotic exposures, travel  Objective: Vitals:   07/13/18 1900 07/13/18 2100 07/13/18 2300 07/14/18 0800  BP: 107/69 125/74 123/81 128/75  Pulse: 77 73 75 74  Resp: 14 16 16 18   Temp: 98.4 F (36.9 C) 98.4 F (36.9 C) 98.8 F (37.1 C) 98.1 F (36.7 C)  TempSrc: Oral Oral Oral   SpO2: 98% 99% 98% 93%  Weight:      Height:       No intake or output data in the 24 hours ending  07/14/18 1156 Filed Weights   07/13/18 0953  Weight: 60 kg    Examination:  General exam: Appears calm and comfortable, obvious slurred  speech Respiratory system: Clear to auscultation. Respiratory effort normal. Cardiovascular system: S1 & S2 heard, RRR. No JVD, murmurs, rubs, gallops or clicks. No pedal edema. Gastrointestinal system: Abdomen is nondistended, soft and nontender. No organomegaly or masses felt. Normal bowel sounds heard. Central nervous system: Alert and oriented.  Bilateral upper extremity weakness right 3/5, left 3+/5.  Bilateral lower extremity 4/5 Extremities: No edema in the lower extremity noted Skin: No rashes, lesions or ulcers Psychiatry: Judgement and insight appear normal. Mood & affect appropriate.     Data Reviewed:   CBC: Recent Labs  Lab 07/13/18 1007 07/13/18 1036 07/13/18 1838  WBC 7.5  --  7.1  NEUTROABS 4.6  --   --   HGB 12.4 13.6 12.4  HCT 41.2 40.0 40.6  MCV 91.8  --  90.0  PLT 313  --  818   Basic Metabolic Panel: Recent Labs  Lab 07/13/18 1007 07/13/18 1036 07/13/18 1838  NA 134* 135  --   K 5.3* 5.2*  --   CL 104 104  --   CO2 24  --   --   GLUCOSE 98 96  --   BUN 39* 39*  --   CREATININE 1.04* 1.00 0.88  CALCIUM 9.1  --   --    GFR: Estimated Creatinine Clearance: 61.6 mL/min (by C-G formula based on SCr of 0.88 mg/dL). Liver Function Tests: Recent Labs  Lab 07/13/18 1007  AST 18  ALT 15  ALKPHOS 77  BILITOT 0.5  PROT 7.2  ALBUMIN 3.9   No results for input(s): LIPASE, AMYLASE in the last 168 hours. No results for input(s): AMMONIA in the last 168 hours. Coagulation Profile: Recent Labs  Lab 07/13/18 1007  INR 1.01   Cardiac Enzymes: No results for input(s): CKTOTAL, CKMB, CKMBINDEX, TROPONINI in the last 168 hours. BNP (last 3 results) No results for input(s): PROBNP in the last 8760 hours. HbA1C: Recent Labs    07/14/18 0658  HGBA1C 5.9*   CBG: No results for input(s): GLUCAP in the last 168 hours. Lipid Profile: Recent Labs    07/14/18 0658  CHOL 167  HDL 46  LDLCALC 98  TRIG 113  CHOLHDL 3.6   Thyroid Function  Tests: No results for input(s): TSH, T4TOTAL, FREET4, T3FREE, THYROIDAB in the last 72 hours. Anemia Panel: No results for input(s): VITAMINB12, FOLATE, FERRITIN, TIBC, IRON, RETICCTPCT in the last 72 hours. Sepsis Labs: No results for input(s): PROCALCITON, LATICACIDVEN in the last 168 hours.  No results found for this or any previous visit (from the past 240 hour(s)).       Radiology Studies: Ct Angio Head W Or Wo Contrast  Result Date: 07/14/2018 CLINICAL DATA:  RIGHT-sided weakness, numbness and tingling for 2 days, aphasia. History of stroke, hypertension, hyperlipidemia. EXAM: CT ANGIOGRAPHY HEAD AND NECK TECHNIQUE: Multidetector CT imaging of the head and neck was performed using the standard protocol during bolus administration of intravenous contrast. Multiplanar CT image reconstructions and MIPs were obtained to evaluate the vascular anatomy. Carotid stenosis measurements (when applicable) are obtained utilizing NASCET criteria, using the distal internal carotid diameter as the denominator. CONTRAST:  84mL ISOVUE-370 IOPAMIDOL (ISOVUE-370) INJECTION 76% COMPARISON:  MRI head July 13, 2018 and CT angiogram head and neck March 04, 2017 FINDINGS: CTA NECK FINDINGS: AORTIC ARCH: Aortic arch is normal course and caliber with  moderate intimal thickening and calcific atherosclerosis. Patent arch vessel origins. Moderate luminal irregularity bilateral subclavian arteries compatible with atherosclerosis, moderate to severe stenosis LEFT proximal subclavian artery. RIGHT CAROTID SYSTEM: Moderate to severe stenosis proximal LEFT common carotid artery due to intimal thickening. Mild stenosis mid to distal LEFT common carotid artery. Chronically occluded LEFT internal carotid artery origin without reconstitution within the neck. LEFT CAROTID SYSTEM: Patent common carotid artery with mild luminal irregularity compatible with atherosclerosis. Calcific atherosclerosis resulting in 50% stenosis RIGHT ICA  due to by NASCET criteria. Patent internal carotid artery. 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 Severe stenosis RIGHT vertebral artery origin, patent. SKELETON: No acute osseous process though bone windows have not been submitted. OTHER NECK: Soft tissues of the neck are nonacute though, not tailored for evaluation. Old moderate C7 and mild T2 compression fractures. Patient is edentulous. UPPER CHEST: Mild centrilobular emphysema and apical bullous changes. No superior mediastinal lymphadenopathy. CTA HEAD FINDINGS: ANTERIOR CIRCULATION: Patent RIGHT internal carotid artery with mild luminal irregularity compatible with atherosclerosis. Occluded LEFT ICA with reconstitution supraclinoid segment via retrograde flow LEFT ophthalmic artery. Patent anterior communicating artery. Moderate tandem stenosis LEFT ACA. Patent middle cerebral arteries with mild luminal irregularity. No contrast extravasation or aneurysm. POSTERIOR CIRCULATION: Patent vertebral arteries, vertebrobasilar junction and basilar artery, as well as main branch vessels. Patent posterior cerebral arteries. Robust RIGHT and small LEFT posterior communicating arteries are present. Mild to moderate tandem stenosis mid to distal PCA. No large vessel occlusion, significant stenosis, contrast extravasation or aneurysm. VENOUS SINUSES: Major dural venous sinuses are patent though not tailored for evaluation on this angiographic examination. ANATOMIC VARIANTS: None. DELAYED PHASE: No abnormal intracranial enhancement. MIP images reviewed. IMPRESSION: CTA NECK: 1. Chronically occluded LEFT ICA.  50% stenosis RIGHT ICA. 2. 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 Severe stenosis RIGHT vertebral artery origin. CTA HEAD: 1. No emergent large vessel occlusion. Similar reconstitution LEFT ICA at supraclinoid segment. 2. Advanced intracranial atherosclerosis. Moderate  stenoses LEFT ACA and mild-to-moderate stenoses bilateral PCA. Electronically Signed   By: Elon Alas M.D.   On: 07/14/2018 05:19   Dg Chest 2 View  Result Date: 07/13/2018 CLINICAL DATA:  CVA. Right side weakness, numbness and tingling x2 days. Hx of HTN, stroke, ETOH abuse. EXAM: CHEST - 2 VIEW COMPARISON:  05/15/2018 FINDINGS: Stable linear opacities in the lung bases. No new infiltrate or overt edema. Heart size upper limits normal. No effusion. Visualized bones unremarkable. IMPRESSION: No acute cardiopulmonary disease. Electronically Signed   By: Lucrezia Europe M.D.   On: 07/13/2018 20:33   Ct Head Wo Contrast  Result Date: 07/13/2018 CLINICAL DATA:  Right-sided weakness and numbness.  Aphasia EXAM: CT HEAD WITHOUT CONTRAST TECHNIQUE: Contiguous axial images were obtained from the base of the skull through the vertex without intravenous contrast. COMPARISON:  December 22, 2017 FINDINGS: Brain: The ventricles are normal in size and configuration. There is no intracranial mass, hemorrhage, extra-axial fluid collection, or midline shift. There is a prior infarct involving the superior left frontal lobe as well as portions of the left parietal lobe, stable. There is a prior infarct in the superomedial left occipital lobe. There is evidence of a prior infarct in the superior mid to posterior right frontal lobe as well as in a portion of the posterior right parietal lobe. There is evidence of a prior white matter infarct in the posterior right centrum semiovale. There are prior small infarcts in the posterior mid right cerebellum and  in the mid cerebellar regions bilaterally. There is a prior small infarct in the anterior left centrum semiovale adjacent to the frontal horn of the left lateral ventricle. No convincing acute infarct is evident on the current examination. Vascular: There is no appreciable hyperdense vessel. There is calcification in each carotid siphon region. Skull: The bony calvarium appears  intact. Sinuses/Orbits: There is mucosal thickening in several ethmoid air cells. Other paranasal sinuses are clear. Orbits appear symmetric bilaterally. Other: Mastoid air cells are clear. IMPRESSION: 1. Multifocal supratentorial and infratentorial infarcts, more severe on the left than on the right, essentially stable compared to most recent study. No acute infarct is appreciable currently. It should be noted that a subtle infarct on the left side given the other changes could be easily obscured. In this regard, it may be reasonable to consider MR with diffusion imaging to further assess. 2.  No mass or hemorrhage is appreciable. 3.  There are foci of arterial vascular calcification. 4.  Mild mucosal thickening noted in several ethmoid air cells. Electronically Signed   By: Lowella Grip III M.D.   On: 07/13/2018 12:15   Ct Angio Neck W Or Wo Contrast  Result Date: 07/14/2018 CLINICAL DATA:  RIGHT-sided weakness, numbness and tingling for 2 days, aphasia. History of stroke, hypertension, hyperlipidemia. EXAM: CT ANGIOGRAPHY HEAD AND NECK TECHNIQUE: Multidetector CT imaging of the head and neck was performed using the standard protocol during bolus administration of intravenous contrast. Multiplanar CT image reconstructions and MIPs were obtained to evaluate the vascular anatomy. Carotid stenosis measurements (when applicable) are obtained utilizing NASCET criteria, using the distal internal carotid diameter as the denominator. CONTRAST:  41mL ISOVUE-370 IOPAMIDOL (ISOVUE-370) INJECTION 76% COMPARISON:  MRI head July 13, 2018 and CT angiogram head and neck March 04, 2017 FINDINGS: CTA NECK FINDINGS: AORTIC ARCH: Aortic arch is normal course and caliber with moderate intimal thickening and calcific atherosclerosis. Patent arch vessel origins. Moderate luminal irregularity bilateral subclavian arteries compatible with atherosclerosis, moderate to severe stenosis LEFT proximal subclavian artery. RIGHT  CAROTID SYSTEM: Moderate to severe stenosis proximal LEFT common carotid artery due to intimal thickening. Mild stenosis mid to distal LEFT common carotid artery. Chronically occluded LEFT internal carotid artery origin without reconstitution within the neck. LEFT CAROTID SYSTEM: Patent common carotid artery with mild luminal irregularity compatible with atherosclerosis. Calcific atherosclerosis resulting in 50% stenosis RIGHT ICA due to by NASCET criteria. Patent internal carotid artery. 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 Severe stenosis RIGHT vertebral artery origin, patent. SKELETON: No acute osseous process though bone windows have not been submitted. OTHER NECK: Soft tissues of the neck are nonacute though, not tailored for evaluation. Old moderate C7 and mild T2 compression fractures. Patient is edentulous. UPPER CHEST: Mild centrilobular emphysema and apical bullous changes. No superior mediastinal lymphadenopathy. CTA HEAD FINDINGS: ANTERIOR CIRCULATION: Patent RIGHT internal carotid artery with mild luminal irregularity compatible with atherosclerosis. Occluded LEFT ICA with reconstitution supraclinoid segment via retrograde flow LEFT ophthalmic artery. Patent anterior communicating artery. Moderate tandem stenosis LEFT ACA. Patent middle cerebral arteries with mild luminal irregularity. No contrast extravasation or aneurysm. POSTERIOR CIRCULATION: Patent vertebral arteries, vertebrobasilar junction and basilar artery, as well as main branch vessels. Patent posterior cerebral arteries. Robust RIGHT and small LEFT posterior communicating arteries are present. Mild to moderate tandem stenosis mid to distal PCA. No large vessel occlusion, significant stenosis, contrast extravasation or aneurysm. VENOUS SINUSES: Major dural venous sinuses are patent though not tailored for evaluation on this  angiographic examination. ANATOMIC VARIANTS: None.  DELAYED PHASE: No abnormal intracranial enhancement. MIP images reviewed. IMPRESSION: CTA NECK: 1. Chronically occluded LEFT ICA.  50% stenosis RIGHT ICA. 2. 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 Severe stenosis RIGHT vertebral artery origin. CTA HEAD: 1. No emergent large vessel occlusion. Similar reconstitution LEFT ICA at supraclinoid segment. 2. Advanced intracranial atherosclerosis. Moderate stenoses LEFT ACA and mild-to-moderate stenoses bilateral PCA. Electronically Signed   By: Elon Alas M.D.   On: 07/14/2018 05:19   Mr Brain Wo Contrast  Result Date: 07/13/2018 CLINICAL DATA:  57 y/o F; right-sided weakness, numbness, and tingling for 2 days as well as aphasia. History of CVA with right-sided deficits. EXAM: MRI HEAD WITHOUT CONTRAST TECHNIQUE: Multiplanar, multiecho pulse sequences of the brain and surrounding structures were obtained without intravenous contrast. COMPARISON:  07/13/2018 CT head.  03/02/2017 MRI head. FINDINGS: Brain: 2 punctate foci of reduced diffusion compatible with acute/early subacute infarction within the left temporal lobe, additional focus within the left occipitotemporal junction, and additional focus within the left medial parietal cortex (series 5, image 51, 52, 66). Several stable very small chronic infarctions within the bilateral cerebellar hemispheres, multiple cortical infarctions in left cerebral hemisphere in a watershed distribution, and additional small cortical infarctions within the right frontal and parietal lobes. Chronic microvascular ischemic changes of white matter and volume loss of the brain are stable. 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 Vascular: Chronic left ICA occlusion. Skull and upper cervical spine:  Normal marrow signal. Sinuses/Orbits: Negative. Other: None. IMPRESSION: 1. Four punctate foci of acute/early subacute infarction within left temporal lobe, left occipitotemporal junction, and the left parietal lobe. No associated hemorrhage or mass effect. 2. Multiple stable chronic infarctions, microvascular ischemic changes, and volume loss of the brain. These results were called by telephone at the time of interpretation on 07/13/2018 at 2:51 pm to Dr. Gareth Morgan , who verbally acknowledged these results. Electronically Signed   By: Kristine Garbe M.D.   On: 07/13/2018 14:52        Scheduled Meds: . aspirin  325 mg Oral Daily  . atorvastatin  40 mg Oral q1800  . clopidogrel  75 mg Oral Daily  . enoxaparin (LOVENOX) injection  40 mg Subcutaneous Q24H  . famotidine  40 mg Oral Daily  . folic acid  1 mg Oral Daily  . gabapentin  300 mg Oral QPC breakfast  . multivitamin with minerals  1 tablet Oral Daily  . oxyCODONE  15 mg Oral Q6H  . thiamine  100 mg Oral Daily   Or  . thiamine  100 mg Intravenous Daily  . tiZANidine  2 mg Oral TID  . venlafaxine XR  150 mg Oral Q breakfast  . [START ON 07/15/2018] Vitamin D (Ergocalciferol)  50,000 Units Oral Q Sat   Continuous Infusions: . sodium chloride 75 mL/hr at 07/14/18 0620  . levETIRAcetam 500 mg (07/14/18 1051)     LOS: 1 day   Time spent= 40 mins    Ankit Arsenio Loader, MD [facility] Pager [phone]   If 7PM-7AM, please contact night-coverage [email or web address] Password [email or web address] 07/14/2018, 11:56 AM

## 2018-07-14 NOTE — Evaluation (Signed)
Physical Therapy Evaluation Patient Details Name: Kelsey Wade MRN: [id number] DOB: Jan 26, 1961 Today's Date: 07/14/2018   History of Present Illness  Pt is a 57 y.o. 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 Four punctate foci of acute/early subacute infarction within left temporal lobe, left occipitotemporal junction, and the left parietal lobe.      Clinical Impression  Pt presented supine in bed with HOB elevated, awake and willing to participate in therapy session. Uncertain of pt's PLOF and home environment as pt with new cognitive deficits and no family/caregivers present to provide any reliable information. Pt currently very limited secondary to cognitive deficits with expressive and receptive difficulties. Multimodal cueing required throughout. Pt performing bed mobility with supervision, transfers with min A and ambulated a short distance with min-mod A with 1HHA. Pt would benefit from further intensive therapy services at [facility] prior to returning home. PT will continue to follow acutely to progress mobility as tolerated.     Follow Up Recommendations [facility]    Equipment Recommendations  None recommended by PT    Recommendations for Other Services Rehab consult     Precautions / Restrictions Precautions Precautions: Fall Precaution Comments: aphasia Restrictions Weight Bearing Restrictions: No      Mobility  Bed Mobility Overal bed mobility: Needs Assistance Bed Mobility: Supine to Sit     Supine to sit: Supervision Sit to supine: Min assist   General bed mobility comments: supervision for safety  Transfers Overall transfer level: Needs assistance Equipment used: None;1 person hand held assist Transfers: Sit to/from Stand Sit to Stand: Min assist          General transfer comment: pt unsteady with rise into standing from EOB, min A for stability and safety with L HHA  Ambulation/Gait Ambulation/Gait assistance: Min assist;Mod assist Gait Distance (Feet): 40 Feet Assistive device: 1 person hand held assist Gait Pattern/deviations: Step-through pattern;Decreased step length - right;Decreased step length - left;Decreased stride length;Drifts right/left Gait velocity: decreased   General Gait Details: pt with modest instability requiring min-mod A throughout with 1HHA on L; multimodal cueing throughout  Stairs            Wheelchair Mobility    Modified Rankin (Stroke Patients Only) Modified Rankin (Stroke Patients Only) Pre-Morbid Rankin Score: Moderate disability Modified Rankin: Severe disability     Balance Overall balance assessment: Needs assistance Sitting-balance support: Feet supported Sitting balance-Leahy Scale: Fair     Standing balance support: Single extremity supported Standing balance-Leahy Scale: Poor                               Pertinent Vitals/Pain Pain Assessment: Faces Faces Pain Scale: Hurts little more Pain Location: with ROM R UE Pain Descriptors / Indicators: Guarding Pain Intervention(s): Monitored during session    Home Living Family/patient expects to be discharged to:: Private residence Living Arrangements: Alone;Other (Comment)(pt reported she lives alone; no family/caregivers to confirm)               Additional Comments: Unsure - pt with new cognitive deficits (expressive and receptive difficulties) - no family/caregivers present to provide reliable information.    Prior Function Level of Independence: Independent with assistive device(s)         Comments: Pt reports yes to RW and  SPC, no family/caregivers present during eval to provide reliable information     Hand Dominance   Dominant Hand: Left    Extremity/Trunk Assessment   Upper Extremity  Assessment Upper Extremity Assessment: Defer to OT evaluation;RUE deficits/detail RUE Deficits / Details: spasticity throughout; difficult to fully assess due to cognitive deficits; pt very guarded with attempted PROM RUE: Unable to fully assess due to immobilization RUE Coordination: decreased fine motor;decreased gross motor    Lower Extremity Assessment Lower Extremity Assessment: Generalized weakness       Communication   Communication: Receptive difficulties;Expressive difficulties  Cognition Arousal/Alertness: Awake/alert Behavior During Therapy: WFL for tasks assessed/performed;Restless Overall Cognitive Status: Impaired/Different from baseline Area of Impairment: Orientation;Attention;Memory;Following commands;Safety/judgement;Awareness;Problem solving                 Orientation Level: Disoriented to;Time;Situation Current Attention Level: Focused Memory: Decreased short-term memory Following Commands: Follows one step commands inconsistently;Follows one step commands with increased time Safety/Judgement: Decreased awareness of safety;Decreased awareness of deficits Awareness: Intellectual Problem Solving: Slow processing;Decreased initiation;Difficulty sequencing;Requires verbal cues;Requires tactile cues General Comments: pt with aphasia, often repeating therapist or stating "yes" or "no" inconsistently      General Comments      Exercises     Assessment/Plan    PT Assessment Patient needs continued PT services  PT Problem List Decreased strength;Decreased range of motion;Decreased activity tolerance;Decreased mobility;Decreased coordination;Decreased balance;Decreased cognition;Decreased knowledge of use of DME;Decreased safety awareness;Decreased knowledge of precautions       PT Treatment Interventions DME instruction;Gait training;Stair training;Functional mobility training;Therapeutic exercise;Balance training;Therapeutic activities;Neuromuscular  re-education;Patient/family education;Cognitive remediation    PT Goals (Current goals can be found in the Care Plan section)  Acute Rehab PT Goals Patient Stated Goal: unable to state PT Goal Formulation: Patient unable to participate in goal setting Time For Goal Achievement: 07/28/18 Potential to Achieve Goals: Good    Frequency Min 4X/week   Barriers to discharge        Co-evaluation               AM-PAC PT "6 Clicks" Daily Activity  Outcome Measure Difficulty turning over in bed (including adjusting bedclothes, sheets and blankets)?: A Little Difficulty moving from lying on back to sitting on the side of the bed? : A Little Difficulty sitting down on and standing up from a chair with arms (e.g., wheelchair, bedside commode, etc,.)?: Unable Help needed moving to and from a bed to chair (including a wheelchair)?: A Little Help needed walking in hospital room?: A Little Help needed climbing 3-5 steps with a railing? : A Lot 6 Click Score: 15    End of Session Equipment Utilized During Treatment: Gait belt Activity Tolerance: Patient tolerated treatment well Patient left: in bed;with call bell/phone within reach;Other (comment)(sitting EOB with OT present) Nurse Communication: Mobility status PT Visit Diagnosis: Other abnormalities of gait and mobility (R26.89);Difficulty in walking, not elsewhere classified (R26.2);Other symptoms and signs involving the nervous system (R29.898)    Time: 7510-2585 PT Time Calculation (min) (ACUTE ONLY): 21 min   Charges:   PT Evaluation $PT Eval Moderate Complexity: 1 Mod          Sherie Don, PT, DPT  Acute Rehabilitation Services Pager [phone] Office [place] 07/14/2018, 4:09 PM

## 2018-07-14 NOTE — Care Management Note (Signed)
Case Management Note  Patient Details  Name: Kelsey Wade MRN: [id number] Date of Birth: 1961/02/11  Subjective/Objective:    Pt admitted with CVA. She is from home. Pt is severely aphasic at this time.                Action/Plan: Awaiting PT/OT evals. ST recommending CIR. CM following for d/c disposition.  Expected Discharge Date:                  Expected Discharge Plan:     In-House Referral:     Discharge planning Services     Post Acute Care Choice:    Choice offered to:     DME Arranged:    DME Agency:     HH Arranged:    HH Agency:     Status of Service:  In process, will continue to follow  If discussed at Long Length of Stay Meetings, dates discussed:    Additional Comments:  Pollie Friar, RN 07/14/2018, 3:52 PM

## 2018-07-14 NOTE — Progress Notes (Signed)
STROKE TEAM PROGRESS NOTE   INTERVAL HISTORY Her husband is at the bedside.  Patient drowsy sleepy, lying in bed, still has mild to moderate expressive aphasia, agitated on neuro exam, worsening right arm weakness than baseline.  Vitals:   07/14/18 0800 07/14/18 1200 07/14/18 1446 07/14/18 1618  BP: 128/75 120/72 135/87 (!) 153/85  Pulse: 74 86 77 80  Resp: 18 16 18 16   Temp: 98.1 F (36.7 C) 98.5 F (36.9 C) 98.4 F (36.9 C) 98.2 F (36.8 C)  TempSrc:  Oral Oral Oral  SpO2: 93% 96% 93% 99%  Weight:      Height:        CBC:  Recent Labs  Lab 07/13/18 1007 07/13/18 1036 07/13/18 1838  WBC 7.5  --  7.1  NEUTROABS 4.6  --   --   HGB 12.4 13.6 12.4  HCT 41.2 40.0 40.6  MCV 91.8  --  90.0  PLT 313  --  176    Basic Metabolic Panel:  Recent Labs  Lab 07/13/18 1007 07/13/18 1036 07/13/18 1838  NA 134* 135  --   K 5.3* 5.2*  --   CL 104 104  --   CO2 24  --   --   GLUCOSE 98 96  --   BUN 39* 39*  --   CREATININE 1.04* 1.00 0.88  CALCIUM 9.1  --   --    Lipid Panel:     Component Value Date/Time   CHOL 167 07/14/2018 0658   TRIG 113 07/14/2018 0658   HDL 46 07/14/2018 0658   CHOLHDL 3.6 07/14/2018 0658   VLDL 23 07/14/2018 0658   LDLCALC 98 07/14/2018 0658   HgbA1c:  Lab Results  Component Value Date   HGBA1C 5.9 (H) 07/14/2018   Urine Drug Screen:     Component Value Date/Time   LABOPIA POSITIVE (A) 07/13/2018 1007   COCAINSCRNUR NONE DETECTED 07/13/2018 1007   LABBENZ NONE DETECTED 07/13/2018 1007   AMPHETMU NONE DETECTED 07/13/2018 1007   THCU NONE DETECTED 07/13/2018 1007   LABBARB NONE DETECTED 07/13/2018 1007    Alcohol Level     Component Value Date/Time   ETH <10 07/13/2018 1007    IMAGING  Ct Angio Head W Or Wo Contrast  Result Date: 07/14/2018 CLINICAL DATA:  RIGHT-sided weakness, numbness and tingling for 2 days, aphasia. History of stroke, hypertension, hyperlipidemia. EXAM: CT ANGIOGRAPHY HEAD AND NECK TECHNIQUE: Multidetector  CT imaging of the head and neck was performed using the standard protocol during bolus administration of intravenous contrast. Multiplanar CT image reconstructions and MIPs were obtained to evaluate the vascular anatomy. Carotid stenosis measurements (when applicable) are obtained utilizing NASCET criteria, using the distal internal carotid diameter as the denominator. CONTRAST:  52mL ISOVUE-370 IOPAMIDOL (ISOVUE-370) INJECTION 76% COMPARISON:  MRI head July 13, 2018 and CT angiogram head and neck March 04, 2017 FINDINGS: CTA NECK FINDINGS: AORTIC ARCH: Aortic arch is normal course and caliber with moderate intimal thickening and calcific atherosclerosis. Patent arch vessel origins. Moderate luminal irregularity bilateral subclavian arteries compatible with atherosclerosis, moderate to severe stenosis LEFT proximal subclavian artery. RIGHT CAROTID SYSTEM: Moderate to severe stenosis proximal LEFT common carotid artery due to intimal thickening. Mild stenosis mid to distal LEFT common carotid artery. Chronically occluded LEFT internal carotid artery origin without reconstitution within the neck. LEFT CAROTID SYSTEM: Patent common carotid artery with mild luminal irregularity compatible with atherosclerosis. Calcific atherosclerosis resulting in 50% stenosis RIGHT ICA due to by NASCET criteria. Patent internal  carotid artery. 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 Severe stenosis RIGHT vertebral artery origin, patent. SKELETON: No acute osseous process though bone windows have not been submitted. OTHER NECK: Soft tissues of the neck are nonacute though, not tailored for evaluation. Old moderate C7 and mild T2 compression fractures. Patient is edentulous. UPPER CHEST: Mild centrilobular emphysema and apical bullous changes. No superior mediastinal lymphadenopathy. CTA HEAD FINDINGS: ANTERIOR CIRCULATION: Patent RIGHT internal carotid artery with mild  luminal irregularity compatible with atherosclerosis. Occluded LEFT ICA with reconstitution supraclinoid segment via retrograde flow LEFT ophthalmic artery. Patent anterior communicating artery. Moderate tandem stenosis LEFT ACA. Patent middle cerebral arteries with mild luminal irregularity. No contrast extravasation or aneurysm. POSTERIOR CIRCULATION: Patent vertebral arteries, vertebrobasilar junction and basilar artery, as well as main branch vessels. Patent posterior cerebral arteries. Robust RIGHT and small LEFT posterior communicating arteries are present. Mild to moderate tandem stenosis mid to distal PCA. No large vessel occlusion, significant stenosis, contrast extravasation or aneurysm. VENOUS SINUSES: Major dural venous sinuses are patent though not tailored for evaluation on this angiographic examination. ANATOMIC VARIANTS: None. DELAYED PHASE: No abnormal intracranial enhancement. MIP images reviewed. IMPRESSION: CTA NECK: 1. Chronically occluded LEFT ICA.  50% stenosis RIGHT ICA. 2. 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 Severe stenosis RIGHT vertebral artery origin. CTA HEAD: 1. No emergent large vessel occlusion. Similar reconstitution LEFT ICA at supraclinoid segment. 2. Advanced intracranial atherosclerosis. Moderate stenoses LEFT ACA and mild-to-moderate stenoses bilateral PCA. Electronically Signed   By: Elon Alas M.D.   On: 07/14/2018 05:19   Dg Chest 2 View  Result Date: 07/13/2018 CLINICAL DATA:  CVA. Right side weakness, numbness and tingling x2 days. Hx of HTN, stroke, ETOH abuse. EXAM: CHEST - 2 VIEW COMPARISON:  05/15/2018 FINDINGS: Stable linear opacities in the lung bases. No new infiltrate or overt edema. Heart size upper limits normal. No effusion. Visualized bones unremarkable. IMPRESSION: No acute cardiopulmonary disease. Electronically Signed   By: Lucrezia Europe M.D.   On: 07/13/2018 20:33   Ct Head Wo Contrast  Result Date:  07/13/2018 CLINICAL DATA:  Right-sided weakness and numbness.  Aphasia EXAM: CT HEAD WITHOUT CONTRAST TECHNIQUE: Contiguous axial images were obtained from the base of the skull through the vertex without intravenous contrast. COMPARISON:  December 22, 2017 FINDINGS: Brain: The ventricles are normal in size and configuration. There is no intracranial mass, hemorrhage, extra-axial fluid collection, or midline shift. There is a prior infarct involving the superior left frontal lobe as well as portions of the left parietal lobe, stable. There is a prior infarct in the superomedial left occipital lobe. There is evidence of a prior infarct in the superior mid to posterior right frontal lobe as well as in a portion of the posterior right parietal lobe. There is evidence of a prior white matter infarct in the posterior right centrum semiovale. There are prior small infarcts in the posterior mid right cerebellum and in the mid cerebellar regions bilaterally. There is a prior small infarct in the anterior left centrum semiovale adjacent to the frontal horn of the left lateral ventricle. No convincing acute infarct is evident on the current examination. Vascular: There is no appreciable hyperdense vessel. There is calcification in each carotid siphon region. Skull: The bony calvarium appears intact. Sinuses/Orbits: There is mucosal thickening in several ethmoid air cells. Other paranasal sinuses are clear. Orbits appear symmetric bilaterally. Other: Mastoid air cells are clear. IMPRESSION: 1. Multifocal supratentorial and infratentorial infarcts, more severe  on the left than on the right, essentially stable compared to most recent study. No acute infarct is appreciable currently. It should be noted that a subtle infarct on the left side given the other changes could be easily obscured. In this regard, it may be reasonable to consider MR with diffusion imaging to further assess. 2.  No mass or hemorrhage is appreciable. 3.  There  are foci of arterial vascular calcification. 4.  Mild mucosal thickening noted in several ethmoid air cells. Electronically Signed   By: Lowella Grip III M.D.   On: 07/13/2018 12:15   Ct Angio Neck W Or Wo Contrast  Result Date: 07/14/2018 CLINICAL DATA:  RIGHT-sided weakness, numbness and tingling for 2 days, aphasia. History of stroke, hypertension, hyperlipidemia. EXAM: CT ANGIOGRAPHY HEAD AND NECK TECHNIQUE: Multidetector CT imaging of the head and neck was performed using the standard protocol during bolus administration of intravenous contrast. Multiplanar CT image reconstructions and MIPs were obtained to evaluate the vascular anatomy. Carotid stenosis measurements (when applicable) are obtained utilizing NASCET criteria, using the distal internal carotid diameter as the denominator. CONTRAST:  7mL ISOVUE-370 IOPAMIDOL (ISOVUE-370) INJECTION 76% COMPARISON:  MRI head July 13, 2018 and CT angiogram head and neck March 04, 2017 FINDINGS: CTA NECK FINDINGS: AORTIC ARCH: Aortic arch is normal course and caliber with moderate intimal thickening and calcific atherosclerosis. Patent arch vessel origins. Moderate luminal irregularity bilateral subclavian arteries compatible with atherosclerosis, moderate to severe stenosis LEFT proximal subclavian artery. RIGHT CAROTID SYSTEM: Moderate to severe stenosis proximal LEFT common carotid artery due to intimal thickening. Mild stenosis mid to distal LEFT common carotid artery. Chronically occluded LEFT internal carotid artery origin without reconstitution within the neck. LEFT CAROTID SYSTEM: Patent common carotid artery with mild luminal irregularity compatible with atherosclerosis. Calcific atherosclerosis resulting in 50% stenosis RIGHT ICA due to by NASCET criteria. Patent internal carotid artery. 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 Severe stenosis RIGHT vertebral artery origin, patent.  SKELETON: No acute osseous process though bone windows have not been submitted. OTHER NECK: Soft tissues of the neck are nonacute though, not tailored for evaluation. Old moderate C7 and mild T2 compression fractures. Patient is edentulous. UPPER CHEST: Mild centrilobular emphysema and apical bullous changes. No superior mediastinal lymphadenopathy. CTA HEAD FINDINGS: ANTERIOR CIRCULATION: Patent RIGHT internal carotid artery with mild luminal irregularity compatible with atherosclerosis. Occluded LEFT ICA with reconstitution supraclinoid segment via retrograde flow LEFT ophthalmic artery. Patent anterior communicating artery. Moderate tandem stenosis LEFT ACA. Patent middle cerebral arteries with mild luminal irregularity. No contrast extravasation or aneurysm. POSTERIOR CIRCULATION: Patent vertebral arteries, vertebrobasilar junction and basilar artery, as well as main branch vessels. Patent posterior cerebral arteries. Robust RIGHT and small LEFT posterior communicating arteries are present. Mild to moderate tandem stenosis mid to distal PCA. No large vessel occlusion, significant stenosis, contrast extravasation or aneurysm. VENOUS SINUSES: Major dural venous sinuses are patent though not tailored for evaluation on this angiographic examination. ANATOMIC VARIANTS: None. DELAYED PHASE: No abnormal intracranial enhancement. MIP images reviewed. IMPRESSION: CTA NECK: 1. Chronically occluded LEFT ICA.  50% stenosis RIGHT ICA. 2. 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 Severe stenosis RIGHT vertebral artery origin. CTA HEAD: 1. No emergent large vessel occlusion. Similar reconstitution LEFT ICA at supraclinoid segment. 2. Advanced intracranial atherosclerosis. Moderate stenoses LEFT ACA and mild-to-moderate stenoses bilateral PCA. Electronically Signed   By: Elon Alas M.D.   On: 07/14/2018 05:19   Mr Brain Wo Contrast  Result Date:  07/13/2018 CLINICAL DATA:  57 y/o F;  right-sided weakness, numbness, and tingling for 2 days as well as aphasia. History of CVA with right-sided deficits. EXAM: MRI HEAD WITHOUT CONTRAST TECHNIQUE: Multiplanar, multiecho pulse sequences of the brain and surrounding structures were obtained without intravenous contrast. COMPARISON:  07/13/2018 CT head.  03/02/2017 MRI head. FINDINGS: Brain: 2 punctate foci of reduced diffusion compatible with acute/early subacute infarction within the left temporal lobe, additional focus within the left occipitotemporal junction, and additional focus within the left medial parietal cortex (series 5, image 51, 52, 66). Several stable very small chronic infarctions within the bilateral cerebellar hemispheres, multiple cortical infarctions in left cerebral hemisphere in a watershed distribution, and additional small cortical infarctions within the right frontal and parietal lobes. Chronic microvascular ischemic changes of white matter and volume loss of the brain are stable. 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 Vascular: Chronic left ICA occlusion. Skull and upper cervical spine: Normal marrow signal. Sinuses/Orbits: Negative. Other: None. IMPRESSION: 1. Four punctate foci of acute/early subacute infarction within left temporal lobe, left occipitotemporal junction, and the left parietal lobe. No associated hemorrhage or mass effect. 2. Multiple stable chronic infarctions, microvascular ischemic changes, and volume loss of the brain. These results were called by telephone at the time of interpretation on 07/13/2018 at 2:51 pm to Dr. Gareth Morgan , who verbally acknowledged these results. Electronically Signed   By: Kristine Garbe M.D.   On: 07/13/2018 14:52    PHYSICAL EXAM  Temp:  [98.1 F (36.7  C)-98.8 F (37.1 C)] 98.2 F (36.8 C) (10/25 1618) Pulse Rate:  [73-86] 80 (10/25 1618) Resp:  [14-18] 16 (10/25 1618) BP: (107-153)/(69-87) 153/85 (10/25 1618) SpO2:  [93 %-99 %] 99 % (10/25 1618)  General - Thin bulit, well developed, drowsy and sleepy.  Ophthalmologic - fundi not visualized due to noncooperation.  Cardiovascular - Regular rate and rhythm.  Neuro - drowsy and sleepy, needed repetitive stimulation for examination.  Not cooperative on examination, easily agitated. Mild to moderate expressive aphasia, perseveration, not able to name, but able to repeat.  Orientated to self only, not orientated to place, age, and time.  PERRL, EOMI, inconsistently blinking visual threat bilaterally.  Right facial droop, tongue protrusion not cooperative.  Left upper extremity 5/5, left lower extremity 5/5, right lower extremity 4+/5, right upper extremity 3/5 proximally and 1-2/5 distally.  Increased muscle tone right upper extremity, DTR 4+ right upper extremity, 3+ bilateral lower extremity, 2+ left upper extremity.  Babinski positive on the right. Sensation, coordination and gait not tested.   ASSESSMENT/PLAN Ms. Cherl Gorney is a 57 y.o. 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   Stroke:  left temporal lobe infarcts in setting of chronic L ICA occlusion and hypoperfusion  CT head L>R multifocal supratentorial and infratentorial infarcts.   MRI  4 punctate acute/early subactue infarct L temporal lobe, L occipitotemporal junction, L parietal. Mult old chronic infarcts  CTA head No LVO. reconstitution L ICA at supraclinoid. Advanced atherosclerosis. L ACA mod stenosis, B PCA mild to mod stenosis  CTA neck chronic L ICA occlusion w/ thready reconstitution. R ICA 50% stenosis. R VA origin severe stenosis   2D Echo EF 65  to  70%  EEG no seizure  Orthostatic vital pending  LDL 98  HgbA1c 5.9  Lovenox 40 mg sq daily for VTE prophylaxis  aspirin 325 mg daily prior to admission, now on aspirin 325 mg daily and clopidogrel 75 mg daily.  Continue aspirin 325 and Plavix 75 DAPT for 3 months and then either aspirin or Plavix alone given large vessel occlusion  Therapy recommendations:  CIR  Disposition:  Pending  ?? Seizure vs. Limb shaking TIA vs. Hyperreflexia  In ER, left arm shaking, received Ativan and Keppra load  During rounds, patient also had shaking jerking with significant hyperreflexia  EEG no seizure  Currently on Keppra 500 twice daily  Recommend to continue Keppra.  It can help both seizure or myoclonus or hyperreflexia  Carotid artery occlusion  CTA head and neck showed left ICA and left tibia occlusion  Right ICA 50% stenosis  Long-term smoker  On aspirin Plavix DAPT  Avoid hypotension, recommend BP goal 130-150 given large vessel occlusion  ?  Orthostatic hypotension  Overnight BP on the lower side  Currently IV fluid, BP improved  Husband stated that patient will feel dizzy if standing up too quickly  Orthostatic vital pending  Put on TED hose  Consider Midrin or Florinef if BP not improving.  History of stroke  02/2017 admitted for right-sided weakness and numbness and slurred speech, MRI showed left MCA infarcts and old left caudate head and occipital infarcts.  MRI showed left ICA occlusion which confirmed by carotid Doppler.  CT head and neck also showed left ICA occlusion and right VA stenosis.  LDL 79 and A1c 5.5.  Put on DAPT and Lipitor.  Hyperlipidemia  Home meds:  lipitor 40, resumed in hospital  LDL 98, goal < 70  Continue Lipitor 40  Continue statin at discharge  Tobacco abuse  Current heavy smoker  Smoking cessation counseling will be provided  Nicotine patch   Other Stroke Risk Factors  ETOH abuse, advised to drink no more than 1  drink(s) a day  hx THC use, UDS positive for opiates only  Other Active Problems  chronic pain syndome  Hospital day # 1  Rosalin Hawking, MD PhD Stroke Neurology 07/14/2018 6:48 PM  I spent  35 minutes in total face-to-face time with the patient, more than 50% of which was spent in counseling and coordination of care, reviewing test results, images and medication, and discussing the diagnosis of left MCA stroke, chronic left ICA occlusion, seizure, limb shaking TIA or hyperreflexia, history of stroke, heavy smoker, hypotension, orthostatic hypotension, treatment plan and potential prognosis. This patient's care requiresreview of multiple databases, neurological assessment, discussion with family, other specialists and medical decision making of high complexity. I had long discussion with husband at bedside, updated pt current condition, treatment plan and potential prognosis. He expressed understanding and appreciation.    To contact Stroke Continuity provider, please refer to [email or web address] After hours, contact General Neurology

## 2018-07-14 NOTE — Progress Notes (Signed)
EEG Completed; Results Pending  

## 2018-07-14 NOTE — Progress Notes (Signed)
Rehab Admissions Coordinator Note:  Patient was screened by Cleatrice Burke for appropriateness for an Inpatient Acute Rehab Consult per PT, OT, and SLP recommendations..  At this time, we are recommending Inpatient Rehab consult. Please place order for consult.  Cleatrice Burke 07/14/2018, 10:14 PM  I can be reached at [phone].

## 2018-07-14 NOTE — Evaluation (Signed)
Occupational Therapy Evaluation Patient Details Name: Kelsey Wade MRN: [id number] DOB: 14-Aug-1961 Today's Date: 07/14/2018    History of Present Illness Pt is a 57 y.o. 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 Four punctate foci of acute/early subacute infarction within left temporal lobe, left occipitotemporal junction, and the left parietal lobe.     Clinical Impression   Pt admitted with above and presents to OT with deficits impacting ability to complete ADLs at [facility].  Unsure of pt's PLOF and home setup as pt with new cognitive deficits with expressive and receptive aphasia.  Pt able to complete dynamic sitting balance for LB dressing task with min guard and min assist for standing balance.  Pt would benefit from continued OT acutely to decrease burden of care and increase independence to d/c to next venue of care.    Follow Up Recommendations  CIR    Equipment Recommendations    TBD   Recommendations for Other Services Rehab consult     Precautions / Restrictions Precautions Precautions: Fall Precaution Comments: aphasia Restrictions Weight Bearing Restrictions: No      Mobility Bed Mobility Overal bed mobility: Needs Assistance Bed Mobility: Supine to Sit     Supine to sit: Supervision Sit to supine: Min assist   General bed mobility comments: supervision for safety  Transfers Overall transfer level: Needs assistance Equipment used: None;1 person hand held assist Transfers: Sit to/from Stand Sit to Stand: Min assist         General transfer comment: pt unsteady with rise into standing from EOB, min A for stability and safety with L HHA    Balance Overall balance assessment: Needs assistance Sitting-balance support: Feet supported Sitting  balance-Leahy Scale: Fair     Standing balance support: Single extremity supported Standing balance-Leahy Scale: Poor                             ADL either performed or assessed with clinical judgement   ADL Overall ADL's : Needs assistance/impaired         Upper Body Bathing: Min guard;Sitting   Lower Body Bathing: Minimal assistance;Sit to/from stand   Upper Body Dressing : Minimal assistance;Sitting   Lower Body Dressing: Moderate assistance;Sit to/from stand   Toilet Transfer: Minimal assistance;Moderate assistance;Ambulation;Cueing for sequencing;Cueing for safety           Functional mobility during ADLs: Minimal assistance;Moderate assistance General ADL Comments: Pt demonstrating difficulty with self-care tasks requiring min guard for sitting balance during LB dressing task and requiring mod multimodal cues due to aphasia and apraxia.     Vision   Vision Assessment?: Vision impaired- to be further tested in functional context Additional Comments: difficult to assess due to decreased ability to follow commands.  Pt did follow therapist with eyes from side to side during functional tasks            Pertinent Vitals/Pain Pain Assessment: Faces Faces Pain Scale: Hurts little more Pain Location: with ROM R UE Pain Descriptors / Indicators: Guarding Pain Intervention(s): Monitored during session     Hand Dominance Left   Extremity/Trunk Assessment Upper Extremity Assessment Upper Extremity Assessment: Defer to OT evaluation;RUE deficits/detail RUE Deficits / Details: spasticity throughout; difficult to fully assess due to cognitive deficits; pt very guarded with  attempted PROM RUE: Unable to fully assess due to immobilization RUE Coordination: decreased fine motor;decreased gross motor   Lower Extremity Assessment Lower Extremity Assessment: Generalized weakness       Communication Communication Communication: Receptive  difficulties;Expressive difficulties   Cognition Arousal/Alertness: Awake/alert Behavior During Therapy: WFL for tasks assessed/performed;Restless Overall Cognitive Status: Impaired/Different from baseline Area of Impairment: Orientation;Attention;Memory;Following commands;Safety/judgement;Awareness;Problem solving                 Orientation Level: Disoriented to;Time;Situation Current Attention Level: Focused Memory: Decreased short-term memory Following Commands: Follows one step commands inconsistently;Follows one step commands with increased time Safety/Judgement: Decreased awareness of safety;Decreased awareness of deficits Awareness: Intellectual Problem Solving: Slow processing;Decreased initiation;Difficulty sequencing;Requires verbal cues;Requires tactile cues General Comments: pt with aphasia, often repeating therapist or stating "yes" or "no" inconsistently              Home Living Family/patient expects to be discharged to:: Private residence Living Arrangements: Alone;Other (Comment)(pt reported she lives alone; no family/caregivers to confirm)                               Additional Comments: Unsure - pt with new cognitive deficits (expressive and receptive difficulties) - no family/caregivers present to provide reliable information.      Prior Functioning/Environment Level of Independence: Independent with assistive device(s)        Comments: Pt reports yes to RW and SPC, no family/caregivers present during eval to provide reliable information        OT Problem List: Decreased strength;Decreased range of motion;Decreased activity tolerance;Impaired balance (sitting and/or standing);Decreased coordination;Decreased cognition;Decreased safety awareness;Decreased knowledge of precautions;Impaired sensation;Impaired tone;Impaired UE functional use      OT Treatment/Interventions: Self-care/ADL training;Neuromuscular education;DME and/or AE  instruction;Therapeutic activities;Cognitive remediation/compensation;Patient/family education;Balance training    OT Goals(Current goals can be found in the care plan section) Acute Rehab OT Goals Patient Stated Goal: unable to state Time For Goal Achievement: 07/28/18 Potential to Achieve Goals: Fair  OT Frequency: Min 2X/week   Barriers to D/C:    unsure as no family/caregivers present and pt unable to report          AM-PAC PT "6 Clicks" Daily Activity     Outcome Measure Help from another person eating meals?: A Little Help from another person taking care of personal grooming?: A Little Help from another person toileting, which includes using toliet, bedpan, or urinal?: A Lot Help from another person bathing (including washing, rinsing, drying)?: A Little Help from another person to put on and taking off regular upper body clothing?: A Little Help from another person to put on and taking off regular lower body clothing?: A Lot 6 Click Score: 16   End of Session Equipment Utilized During Treatment: Gait belt Nurse Communication: Mobility status  Activity Tolerance: Patient tolerated treatment well Patient left: in bed;with bed alarm set;with call bell/phone within reach  OT Visit Diagnosis: Unsteadiness on feet (R26.81);Muscle weakness (generalized) (M62.81);Apraxia (R48.2);Other symptoms and signs involving the nervous system (R29.898);Hemiplegia and hemiparesis Hemiplegia - Right/Left: Right Hemiplegia - dominant/non-dominant: Dominant                Time: 6967-8938 OT Time Calculation (min): 11 min Charges:  OT General Charges $OT Visit: 1 Visit OT Evaluation $OT Eval Moderate Complexity: [place], [place], [address] 07/14/2018, 4:15 PM

## 2018-07-14 NOTE — Evaluation (Signed)
Clinical/Bedside Swallow Evaluation Patient Details  Name: Kelsey Wade MRN: [id number] Date of Birth: [birth year]  Today's Date: 07/14/2018 Time: SLP Start Time (ACUTE ONLY): 0830 SLP Stop Time (ACUTE ONLY): 0850 SLP Time Calculation (min) (ACUTE ONLY): 20 min  Past Medical History:  Past Medical History:  Diagnosis Date  . Anxiety   . Arthritis   . Chronic back pain   . Depression   . ETOH abuse   . GERD (gastroesophageal reflux disease)   . HTN (hypertension)   . Hyperlipidemia   . Stroke [facility])    June 2018  . Tobacco abuse    Past Surgical History:  Past Surgical History:  Procedure Laterality Date  . APPENDECTOMY     HPI:  57 y.o. 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 Four punctate foci of acute/early subacute infarction within left temporal lobe, left occipitotemporal junction, and the left parietal lobe.    Assessment / Plan / Recommendation Clinical Impression  Pt demonstrates normal swallow function. May initiate are regular diet and thin liquids. Will sign off for swallowing, see next note for linguisitic assessment SLP Visit Diagnosis: Dysphagia, unspecified (R13.10)    Aspiration Risk  Mild aspiration risk    Diet Recommendation Regular;Thin liquid   Medication Administration: Whole meds with liquid Supervision: Patient able to self feed    Other  Recommendations     Follow up Recommendations        Frequency and Duration            Prognosis        Swallow Study   General HPI: 57 y.o. 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  Four punctate foci of acute/early subacute infarction within left temporal lobe, left occipitotemporal junction, and the left parietal lobe.  Type of Study: Bedside Swallow Evaluation Diet Prior to this Study: NPO Temperature Spikes Noted: No Respiratory Status: Room air History of Recent Intubation: No Behavior/Cognition: Alert;Cooperative;Pleasant mood Oral Cavity Assessment: Within Functional Limits Oral Care Completed by SLP: No Oral Cavity - Dentition: Adequate natural dentition Vision: Functional for self-feeding Self-Feeding Abilities: Able to feed self Patient Positioning: Upright in bed Baseline Vocal Quality: Normal Volitional Cough: Strong Volitional Swallow: Able to elicit    Oral/Motor/Sensory Function Overall Oral Motor/Sensory Function: Within functional limits   Ice Chips     Thin Liquid Thin Liquid: Within functional limits    Nectar Thick Nectar Thick Liquid: Not tested   Honey Thick Honey Thick Liquid: Not tested   Puree Puree: Within functional limits   Solid     Solid: Within functional limits      Ryah Cribb, Katherene Ponto 07/14/2018,2:06 PM

## 2018-07-15 DIAGNOSIS — R4701 Aphasia: Secondary | ICD-10-CM

## 2018-07-15 DIAGNOSIS — I639 Cerebral infarction, unspecified: Principal | ICD-10-CM

## 2018-07-15 DIAGNOSIS — G8191 Hemiplegia, unspecified affecting right dominant side: Secondary | ICD-10-CM

## 2018-07-15 LAB — BASIC METABOLIC PANEL
Anion gap: 6 (ref 5–15)
BUN: 16 mg/dL (ref 6–20)
CHLORIDE: 107 mmol/L (ref 98–111)
CO2: 25 mmol/L (ref 22–32)
CREATININE: 0.72 mg/dL (ref 0.44–1.00)
Calcium: 8.8 mg/dL — ABNORMAL LOW (ref 8.9–10.3)
GFR calc Af Amer: >60 mL/min (ref >60)
GFR calc non Af Amer: >60 mL/min (ref >60)
GLUCOSE: 102 mg/dL — AB (ref 70–99)
Potassium: 4.9 mmol/L (ref 3.5–5.1)
SODIUM: 138 mmol/L (ref 135–145)

## 2018-07-15 NOTE — Progress Notes (Signed)
Pt is reporting that her pink cell phone is missing. PT says phone was in the bed with her last night. RN checked pt's linen, closet , under bed and a phone was not found. PT also states her watch is also missing. RN called security so a report could be filed. Security came to file a report; and pt just kept saying " I want to go home " to security and did not file a report.

## 2018-07-15 NOTE — Plan of Care (Signed)
The Stroke Team will sign off at this time. Please call with questions or if we can be of further service. CIR recommended.  Mikey Bussing PA-C [facility] Pager [phone] 07/15/2018, 10:59 AM

## 2018-07-15 NOTE — Progress Notes (Signed)
PROGRESS NOTE    Kelsey Wade  CBJ:[id number] DOB: [birth year] DOA: 07/13/2018 PCP: Elenore Paddy, FNP   Brief Narrative:  57 year old with a history of previous CVA with right-sided spastic hemiplegia, active tobacco use, alcohol use, hyperlipidemia, chronic left-sided carotid artery occlusion, essential hypertension was brought to the hospital for evaluation due to right upper extremity weakness and jerking.  MRI of the brain showed acute/subacute left temporal lobe, left occipitotemporal junction and left temporal lobe infarct.  Due to concerns of some shaking patient was also started on IV Keppra.  Neurology was consulted.  EEG done which was suggestive of mild to moderate generalized cerebral dysfunction.  Physical therapy recommended CIR admission.  Speech and swallow team recommended regular thin liquid with mild aspiration precaution.   Assessment & Plan:   Principal Problem:   CVA (cerebral vascular accident) ([place]) Active Problems:   Essential hypertension   Tobacco dependence   Hyperlipidemia LDL goal <100   Chronic pain   ETOH abuse  Acute CVA with left temporal lobe infarct Slurred speech, improved - Suspect from hypoperfusion due to ICA occlusion. - MRI of the brain is positive for acute/subacute left temporal lobe infarct. need to focus on avoiding hypotension -CTA of the head and neck- chronically occluded left ICA 50% on the right chronically occluded left vertebral artery.  Advanced intracranial atherosclerosis -Continue aspirin and Plavix for at least 3 months followed by single agent - Speech and swallow evaluation- regular diet with mild aspiration precaution -PT/OT evaluation- recommending CIR -Check orthostatic vital signs, IV fluids if necessary - Hemoglobin A1c 5.9, LDL is 98 - Vitamin B12-elevated, TSH within normal limits, folate-pending  Partial seizure disorder - EEG was negative for any seizure activity, shows generalized slowing.  Continue  Keppra 500 mg twice daily  Essential hypertension - Currently on hold due to permissive hypertension in the setting of hypoperfusion.  Hyperlipidemia -Continue statin-atorvastatin 40 mg daily  Chronic pain - Continue home regimen of oxycodone 15 mg every 6 hours.  Gabapentin 300 mg daily, Zanaflex 2 mg 3 times daily - Bowel regimen added  History of depression -Continue Effexor  Alcohol use and tobacco use -Nicotine patch if needed.  Alcohol withdrawal protocol in place. -Counseled to quit using alcohol and tobacco. -Vitamins started  DVT prophylaxis: Lovenox Code Status: Full code Family Communication: Husband and uncle at bedside Disposition Plan: Currently awaiting CIR evaluation.  Consultants:   Neurology  Procedures:   None  Antimicrobials:   None   Subjective: Her speech is more clear today and is able to carry on basic conversation.  No complaints.  She understands she needs to quit smoking.  Review of Systems Otherwise negative except as per HPI, including: General = no fevers, chills, dizziness, malaise, fatigue HEENT/EYES = negative for pain, redness, loss of vision, double vision, blurred vision, loss of hearing, sore throat, hoarseness, dysphagia Cardiovascular= negative for chest pain, palpitation, murmurs, lower extremity swelling Respiratory/lungs= negative for shortness of breath, cough, hemoptysis, wheezing, mucus production Gastrointestinal= negative for nausea, vomiting,, abdominal pain, melena, hematemesis Genitourinary= negative for Dysuria, Hematuria, Change in Urinary Frequency MSK = Negative for arthralgia, myalgias, Back Pain, Joint swelling  Neurology= Negative for headache, seizures, numbness, tingling  Psychiatry= Negative for anxiety, depression, suicidal and homocidal ideation Allergy/Immunology= Medication/Food allergy as listed  Skin= Negative for Rash, lesions, ulcers, itching   Objective: Vitals:   07/14/18 2357 07/15/18 0307  07/15/18 0754 07/15/18 1131  BP: 139/78 137/79 (!) 135/93 (!) 156/91  Pulse: 81 80 87 80  Resp: 17 18 16 17   Temp: 98.5 F (36.9 C) 98.2 F (36.8 C) 98.3 F (36.8 C) 98 F (36.7 C)  TempSrc: Oral Oral  Oral  SpO2: 96% 96% 99% 98%  Weight:      Height:        Intake/Output Summary (Last 24 hours) at 07/15/2018 1204 Last data filed at 07/15/2018 0400 Gross per 24 hour  Intake 640 ml  Output 901 ml  Net -261 ml   Filed Weights   07/13/18 0953  Weight: 60 kg    Examination:  Constitutional: NAD, calm, comfortable, generally weak appearing Eyes: PERRL, lids and conjunctivae normal ENMT: Mucous membranes are moist. Posterior pharynx clear of any exudate or lesions.Normal dentition.  Neck: normal, supple, no masses, no thyromegaly Respiratory: clear to auscultation bilaterally, no wheezing, no crackles. Normal respiratory effort. No accessory muscle use.  Cardiovascular: Regular rate and rhythm, no murmurs / rubs / gallops. No extremity edema. 2+ pedal pulses. No carotid bruits.  Abdomen: no tenderness, no masses palpated. No hepatosplenomegaly. Bowel sounds positive.  Musculoskeletal: no clubbing / cyanosis. No joint deformity upper and lower extremities. Good ROM, no contractures. Normal muscle tone.  Skin: no rashes, lesions, ulcers. No induration Neurologic: CN 2-12 grossly intact. Sensation intact, DTR normal. Strength3+5/5 in all 4.  Psychiatric: Normal judgment and insight. Alert and oriented x 3. Normal mood.     Data Reviewed:   CBC: Recent Labs  Lab 07/13/18 1007 07/13/18 1036 07/13/18 1838  WBC 7.5  --  7.1  NEUTROABS 4.6  --   --   HGB 12.4 13.6 12.4  HCT 41.2 40.0 40.6  MCV 91.8  --  90.0  PLT 313  --  361   Basic Metabolic Panel: Recent Labs  Lab 07/13/18 1007 07/13/18 1036 07/13/18 1838 07/15/18 0533  NA 134* 135  --  138  K 5.3* 5.2*  --  4.9  CL 104 104  --  107  CO2 24  --   --  25  GLUCOSE 98 96  --  102*  BUN 39* 39*  --  16    CREATININE 1.04* 1.00 0.88 0.72  CALCIUM 9.1  --   --  8.8*   GFR: Estimated Creatinine Clearance: 67.8 mL/min (by C-G formula based on SCr of 0.72 mg/dL). Liver Function Tests: Recent Labs  Lab 07/13/18 1007  AST 18  ALT 15  ALKPHOS 77  BILITOT 0.5  PROT 7.2  ALBUMIN 3.9   No results for input(s): LIPASE, AMYLASE in the last 168 hours. No results for input(s): AMMONIA in the last 168 hours. Coagulation Profile: Recent Labs  Lab 07/13/18 1007  INR 1.01   Cardiac Enzymes: No results for input(s): CKTOTAL, CKMB, CKMBINDEX, TROPONINI in the last 168 hours. BNP (last 3 results) No results for input(s): PROBNP in the last 8760 hours. HbA1C: Recent Labs    07/14/18 0658  HGBA1C 5.9*   CBG: No results for input(s): GLUCAP in the last 168 hours. Lipid Profile: Recent Labs    07/14/18 0658  CHOL 167  HDL 46  LDLCALC 98  TRIG 113  CHOLHDL 3.6   Thyroid Function Tests: Recent Labs    07/14/18 1026  TSH 0.432   Anemia Panel: Recent Labs    07/14/18 1026  VITAMINB12 3,215*   Sepsis Labs: No results for input(s): PROCALCITON, LATICACIDVEN in the last 168 hours.  No results found for this or any previous visit (from the past 240 hour(s)).  Radiology Studies: Ct Angio Head W Or Wo Contrast  Result Date: 07/14/2018 CLINICAL DATA:  RIGHT-sided weakness, numbness and tingling for 2 days, aphasia. History of stroke, hypertension, hyperlipidemia. EXAM: CT ANGIOGRAPHY HEAD AND NECK TECHNIQUE: Multidetector CT imaging of the head and neck was performed using the standard protocol during bolus administration of intravenous contrast. Multiplanar CT image reconstructions and MIPs were obtained to evaluate the vascular anatomy. Carotid stenosis measurements (when applicable) are obtained utilizing NASCET criteria, using the distal internal carotid diameter as the denominator. CONTRAST:  45mL ISOVUE-370 IOPAMIDOL (ISOVUE-370) INJECTION 76% COMPARISON:  MRI head  July 13, 2018 and CT angiogram head and neck March 04, 2017 FINDINGS: CTA NECK FINDINGS: AORTIC ARCH: Aortic arch is normal course and caliber with moderate intimal thickening and calcific atherosclerosis. Patent arch vessel origins. Moderate luminal irregularity bilateral subclavian arteries compatible with atherosclerosis, moderate to severe stenosis LEFT proximal subclavian artery. RIGHT CAROTID SYSTEM: Moderate to severe stenosis proximal LEFT common carotid artery due to intimal thickening. Mild stenosis mid to distal LEFT common carotid artery. Chronically occluded LEFT internal carotid artery origin without reconstitution within the neck. LEFT CAROTID SYSTEM: Patent common carotid artery with mild luminal irregularity compatible with atherosclerosis. Calcific atherosclerosis resulting in 50% stenosis RIGHT ICA due to by NASCET criteria. Patent internal carotid artery. 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 Severe stenosis RIGHT vertebral artery origin, patent. SKELETON: No acute osseous process though bone windows have not been submitted. OTHER NECK: Soft tissues of the neck are nonacute though, not tailored for evaluation. Old moderate C7 and mild T2 compression fractures. Patient is edentulous. UPPER CHEST: Mild centrilobular emphysema and apical bullous changes. No superior mediastinal lymphadenopathy. CTA HEAD FINDINGS: ANTERIOR CIRCULATION: Patent RIGHT internal carotid artery with mild luminal irregularity compatible with atherosclerosis. Occluded LEFT ICA with reconstitution supraclinoid segment via retrograde flow LEFT ophthalmic artery. Patent anterior communicating artery. Moderate tandem stenosis LEFT ACA. Patent middle cerebral arteries with mild luminal irregularity. No contrast extravasation or aneurysm. POSTERIOR CIRCULATION: Patent vertebral arteries, vertebrobasilar junction and basilar artery, as well as main branch vessels.  Patent posterior cerebral arteries. Robust RIGHT and small LEFT posterior communicating arteries are present. Mild to moderate tandem stenosis mid to distal PCA. No large vessel occlusion, significant stenosis, contrast extravasation or aneurysm. VENOUS SINUSES: Major dural venous sinuses are patent though not tailored for evaluation on this angiographic examination. ANATOMIC VARIANTS: None. DELAYED PHASE: No abnormal intracranial enhancement. MIP images reviewed. IMPRESSION: CTA NECK: 1. Chronically occluded LEFT ICA.  50% stenosis RIGHT ICA. 2. 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 Severe stenosis RIGHT vertebral artery origin. CTA HEAD: 1. No emergent large vessel occlusion. Similar reconstitution LEFT ICA at supraclinoid segment. 2. Advanced intracranial atherosclerosis. Moderate stenoses LEFT ACA and mild-to-moderate stenoses bilateral PCA. Electronically Signed   By: Elon Alas M.D.   On: 07/14/2018 05:19   Dg Chest 2 View  Result Date: 07/13/2018 CLINICAL DATA:  CVA. Right side weakness, numbness and tingling x2 days. Hx of HTN, stroke, ETOH abuse. EXAM: CHEST - 2 VIEW COMPARISON:  05/15/2018 FINDINGS: Stable linear opacities in the lung bases. No new infiltrate or overt edema. Heart size upper limits normal. No effusion. Visualized bones unremarkable. IMPRESSION: No acute cardiopulmonary disease. Electronically Signed   By: Lucrezia Europe M.D.   On: 07/13/2018 20:33   Ct Angio Neck W Or Wo Contrast  Result Date: 07/14/2018 CLINICAL DATA:  RIGHT-sided weakness, numbness and tingling for 2 days, aphasia. History of stroke, hypertension,  hyperlipidemia. EXAM: CT ANGIOGRAPHY HEAD AND NECK TECHNIQUE: Multidetector CT imaging of the head and neck was performed using the standard protocol during bolus administration of intravenous contrast. Multiplanar CT image reconstructions and MIPs were obtained to evaluate the vascular anatomy. Carotid stenosis measurements (when  applicable) are obtained utilizing NASCET criteria, using the distal internal carotid diameter as the denominator. CONTRAST:  55mL ISOVUE-370 IOPAMIDOL (ISOVUE-370) INJECTION 76% COMPARISON:  MRI head July 13, 2018 and CT angiogram head and neck March 04, 2017 FINDINGS: CTA NECK FINDINGS: AORTIC ARCH: Aortic arch is normal course and caliber with moderate intimal thickening and calcific atherosclerosis. Patent arch vessel origins. Moderate luminal irregularity bilateral subclavian arteries compatible with atherosclerosis, moderate to severe stenosis LEFT proximal subclavian artery. RIGHT CAROTID SYSTEM: Moderate to severe stenosis proximal LEFT common carotid artery due to intimal thickening. Mild stenosis mid to distal LEFT common carotid artery. Chronically occluded LEFT internal carotid artery origin without reconstitution within the neck. LEFT CAROTID SYSTEM: Patent common carotid artery with mild luminal irregularity compatible with atherosclerosis. Calcific atherosclerosis resulting in 50% stenosis RIGHT ICA due to by NASCET criteria. Patent internal carotid artery. 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 Severe stenosis RIGHT vertebral artery origin, patent. SKELETON: No acute osseous process though bone windows have not been submitted. OTHER NECK: Soft tissues of the neck are nonacute though, not tailored for evaluation. Old moderate C7 and mild T2 compression fractures. Patient is edentulous. UPPER CHEST: Mild centrilobular emphysema and apical bullous changes. No superior mediastinal lymphadenopathy. CTA HEAD FINDINGS: ANTERIOR CIRCULATION: Patent RIGHT internal carotid artery with mild luminal irregularity compatible with atherosclerosis. Occluded LEFT ICA with reconstitution supraclinoid segment via retrograde flow LEFT ophthalmic artery. Patent anterior communicating artery. Moderate tandem stenosis LEFT ACA. Patent middle cerebral arteries  with mild luminal irregularity. No contrast extravasation or aneurysm. POSTERIOR CIRCULATION: Patent vertebral arteries, vertebrobasilar junction and basilar artery, as well as main branch vessels. Patent posterior cerebral arteries. Robust RIGHT and small LEFT posterior communicating arteries are present. Mild to moderate tandem stenosis mid to distal PCA. No large vessel occlusion, significant stenosis, contrast extravasation or aneurysm. VENOUS SINUSES: Major dural venous sinuses are patent though not tailored for evaluation on this angiographic examination. ANATOMIC VARIANTS: None. DELAYED PHASE: No abnormal intracranial enhancement. MIP images reviewed. IMPRESSION: CTA NECK: 1. Chronically occluded LEFT ICA.  50% stenosis RIGHT ICA. 2. 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 Severe stenosis RIGHT vertebral artery origin. CTA HEAD: 1. No emergent large vessel occlusion. Similar reconstitution LEFT ICA at supraclinoid segment. 2. Advanced intracranial atherosclerosis. Moderate stenoses LEFT ACA and mild-to-moderate stenoses bilateral PCA. Electronically Signed   By: Elon Alas M.D.   On: 07/14/2018 05:19   Mr Brain Wo Contrast  Result Date: 07/13/2018 CLINICAL DATA:  57 y/o F; right-sided weakness, numbness, and tingling for 2 days as well as aphasia. History of CVA with right-sided deficits. EXAM: MRI HEAD WITHOUT CONTRAST TECHNIQUE: Multiplanar, multiecho pulse sequences of the brain and surrounding structures were obtained without intravenous contrast. COMPARISON:  07/13/2018 CT head.  03/02/2017 MRI head. FINDINGS: Brain: 2 punctate foci of reduced diffusion compatible with acute/early subacute infarction within the left temporal lobe, additional focus within the left occipitotemporal junction, and additional focus within the left medial parietal cortex (series 5, image 51, 52, 66). Several stable very small chronic infarctions within the bilateral cerebellar  hemispheres, multiple cortical infarctions in left cerebral hemisphere in a watershed distribution, and additional small cortical infarctions within the right frontal and  parietal lobes. Chronic microvascular ischemic changes of white matter and volume loss of the brain are stable. 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 Vascular: Chronic left ICA occlusion. Skull and upper cervical spine: Normal marrow signal. Sinuses/Orbits: Negative. Other: None. IMPRESSION: 1. Four punctate foci of acute/early subacute infarction within left temporal lobe, left occipitotemporal junction, and the left parietal lobe. No associated hemorrhage or mass effect. 2. Multiple stable chronic infarctions, microvascular ischemic changes, and volume loss of the brain. These results were called by telephone at the time of interpretation on 07/13/2018 at 2:51 pm to Dr. Gareth Morgan , who verbally acknowledged these results. Electronically Signed   By: Kristine Garbe M.D.   On: 07/13/2018 14:52        Scheduled Meds: . aspirin  325 mg Oral Daily  . atorvastatin  40 mg Oral q1800  . clopidogrel  75 mg Oral Daily  . enoxaparin (LOVENOX) injection  40 mg Subcutaneous Q24H  . famotidine  40 mg Oral Daily  . folic acid  1 mg Oral Daily  . gabapentin  300 mg Oral QPC breakfast  . levETIRAcetam  500 mg Oral BID  . multivitamin with minerals  1 tablet Oral Daily  . oxyCODONE  15 mg Oral Q6H  . polyethylene glycol  17 g Oral Daily  . thiamine  100 mg Oral Daily   Or  . thiamine  100 mg Intravenous Daily  . tiZANidine  2 mg Oral TID  . venlafaxine XR  150 mg Oral Q breakfast  . Vitamin D (Ergocalciferol)  50,000 Units Oral Q Sat   Continuous Infusions: . sodium chloride Stopped (07/15/18 0320)     LOS: 2 days    Time spent=  32mins    Ankit Arsenio Loader, MD [facility] Pager [phone]   If 7PM-7AM, please contact night-coverage [email or web address] Password [email or web address] 07/15/2018, 12:04 PM

## 2018-07-15 NOTE — Progress Notes (Addendum)
Physical Therapy Treatment Patient Details Name: Kelsey Wade MRN: [id number] DOB: [birth year] Today's Date: 07/15/2018    History of Present Illness Pt is a 57 y.o. 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 Four punctate foci of acute/early subacute infarction within left temporal lobe, left occipitotemporal junction, and the left parietal lobe.      PT Comments    Pt performed gait and UE/LE strengthening.  Pt continues to present with R UE pain with movement.  Pt continues to be aphasic but some sentences are intelligible and appropriate to conversations and other times she is just repeating phrases.     Follow Up Recommendations  CIR     Equipment Recommendations  None recommended by PT    Recommendations for Other Services Rehab consult     Precautions / Restrictions Precautions Precautions: Fall Precaution Comments: aphasia Restrictions Weight Bearing Restrictions: No    Mobility  Bed Mobility Overal bed mobility: Needs Assistance Bed Mobility: Supine to Sit     Supine to sit: Supervision Sit to supine: Supervision   General bed mobility comments: supervision for safety  Transfers Overall transfer level: Needs assistance Equipment used: None Transfers: Sit to/from Stand Sit to Stand: Min guard         General transfer comment: Pt stood impulsively once in standing required min assistance to maintain balance.    Ambulation/Gait Ambulation/Gait assistance: Min assist Gait Distance (Feet): 80 Feet Assistive device: None Gait Pattern/deviations: Step-through pattern;Decreased step length - right;Decreased step length - left;Decreased stride length;Drifts right/left;Staggering right;Staggering left Gait velocity: decreased   General Gait Details:  Pt required increased assistance with turning and backing, with head turns patient had to stop completely.  Cues for armswing on L, RUE posture in flexion.     Stairs             Wheelchair Mobility    Modified Rankin (Stroke Patients Only) Modified Rankin (Stroke Patients Only) Pre-Morbid Rankin Score: Moderate disability Modified Rankin: Moderately severe disability     Balance                                            Cognition Arousal/Alertness: Awake/alert Behavior During Therapy: WFL for tasks assessed/performed;Restless Overall Cognitive Status: Impaired/Different from baseline                   Orientation Level: Disoriented to;Time;Situation Current Attention Level: Focused Memory: Decreased short-term memory Following Commands: Follows one step commands inconsistently;Follows one step commands with increased time Safety/Judgement: Decreased awareness of safety;Decreased awareness of deficits Awareness: Intellectual Problem Solving: Slow processing;Decreased initiation;Difficulty sequencing;Requires verbal cues;Requires tactile cues General Comments: pt with aphasia, often repeating therapist, was able to report she had a stroke. She could not follow commands to wash hands and required step by step instruction to turn on water, use soap, rinse and dry hands.        Exercises Other Exercises Other Exercises: B UE flexion x5 reps with AAROM on R,  attempted R digit extension but patient is too painful.  Pt performed sit to stand x5 with faciliation for hip extension x10 reps.      General Comments        Pertinent Vitals/Pain Pain  Assessment: Faces Faces Pain Scale: Hurts little more Pain Location: with ROM R UE Pain Intervention(s): Monitored during session;Repositioned    Home Living                      Prior Function            PT Goals (current goals can now be found in the care plan section) Acute Rehab PT  Goals Patient Stated Goal: unable to state Potential to Achieve Goals: Good Progress towards PT goals: Progressing toward goals    Frequency    Min 4X/week      PT Plan Current plan remains appropriate    Co-evaluation              AM-PAC PT "6 Clicks" Daily Activity  Outcome Measure  Difficulty turning over in bed (including adjusting bedclothes, sheets and blankets)?: A Little Difficulty moving from lying on back to sitting on the side of the bed? : A Little Difficulty sitting down on and standing up from a chair with arms (e.g., wheelchair, bedside commode, etc,.)?: Unable Help needed moving to and from a bed to chair (including a wheelchair)?: A Little Help needed walking in hospital room?: A Little Help needed climbing 3-5 steps with a railing? : A Lot 6 Click Score: 15    End of Session Equipment Utilized During Treatment: Gait belt Activity Tolerance: Patient tolerated treatment well Patient left: in bed;with call bell/phone within reach;Other (comment) Nurse Communication: Mobility status PT Visit Diagnosis: Other abnormalities of gait and mobility (R26.89);Difficulty in walking, not elsewhere classified (R26.2);Other symptoms and signs involving the nervous system (R29.898)     Time: 3893-7342 PT Time Calculation (min) (ACUTE ONLY): 25 min  Charges:  $Gait Training: 8-22 mins $Therapeutic Exercise: 8-22 mins                     Governor Rooks, PTA Acute Rehabilitation Services Pager [phone] Office [phone]     Maddelynn Moosman Eli Hose 07/15/2018, 11:52 AM

## 2018-07-15 NOTE — Consult Note (Signed)
Physical Medicine and Rehabilitation Consult Reason for Consult:right sided weakness Referring Physician: Reesa Chew   HPI: Kelsey Wade is a 57 y.o. 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  She was admitted on 10/24 2019 with worsening weakness on her right side and increasing word finding deficits.  MRI revealed for punctate foci of acute/early subacute infarction within the left temporal lobe, left occipital temporal junction, and the left parietal lobe without hemorrhage.  Other multiple, chronic infarcts were also noted.  Patient has been mobilized with physical therapy and continues to demonstrate difficulties with balance and right-sided weakness.  Also language deficits persist.  Physical medicine and rehabilitation was asked to assess the patient for potential rehabilitation needs.   Review of Systems  Unable to perform ROS: Language   Past Medical History:  Diagnosis Date  . Anxiety   . Arthritis   . Chronic back pain   . Depression   . ETOH abuse   . GERD (gastroesophageal reflux disease)   . HTN (hypertension)   . Hyperlipidemia   . Stroke [facility])    June 2018  . Tobacco abuse    Past Surgical History:  Procedure Laterality Date  . APPENDECTOMY     Family History  Problem Relation Age of Onset  . Alcohol abuse Father   . Alcohol abuse Brother   . Breast cancer Maternal Aunt   . Colon cancer Neg Hx   . Esophageal cancer Neg Hx   . Stomach cancer Neg Hx   . Rectal cancer Neg Hx    Social History:  reports that she has been smoking cigarettes. She has been smoking about 0.50 packs per day. She has never used smokeless tobacco. She reports that she drinks about 2.0 standard drinks of alcohol per week. She reports that she has current or past drug history. Drug: Marijuana. Allergies: No Known Allergies Medications Prior to Admission  Medication Sig Dispense Refill  . albuterol (PROAIR  HFA) 108 (90 Base) MCG/ACT inhaler Inhale 2 puffs into the lungs every 6 (six) hours as needed for wheezing or shortness of breath.    Marland Kitchen amLODipine (NORVASC) 10 MG tablet Take 10 mg by mouth daily.  2  . aspirin 325 MG tablet Take 325 mg by mouth daily.    Marland Kitchen atorvastatin (LIPITOR) 40 MG tablet Take 1 tablet (40 mg total) by mouth daily. 30 tablet 1  . famotidine (PEPCID) 40 MG tablet Take 1 tablet (40 mg total) by mouth at bedtime. (Patient taking differently: Take 40 mg by mouth daily. ) 30 tablet 1  . folic acid (FOLVITE) 1 MG tablet Take 1 tablet (1 mg total) by mouth daily.    Marland Kitchen gabapentin (NEURONTIN) 300 MG capsule Take 300 mg by mouth daily after breakfast.    . hydrOXYzine (ATARAX/VISTARIL) 10 MG tablet Take 10 mg by mouth daily as needed for anxiety.    . hydrOXYzine (VISTARIL) 25 MG capsule Take 25 mg by mouth at bedtime.    . Multiple Vitamin (MULTIVITAMIN WITH MINERALS) TABS tablet Take 1 tablet by mouth daily.    Marland Kitchen oxyCODONE (ROXICODONE) 15 MG immediate release tablet Take 1 tablet by mouth every 6 (six) hours.   0  . thiamine 100 MG tablet Take 1 tablet (100 mg total) by mouth daily.    Marland Kitchen tiZANidine (ZANAFLEX) 2 MG tablet Take 1 tablet (2 mg total) by mouth 3 (three) times daily. [place]  tablet 1  . venlafaxine XR (EFFEXOR-XR) 150 MG 24 hr capsule Take 1 capsule (150 mg total) by mouth daily with breakfast. 30 capsule 1  . Vitamin D, Ergocalciferol, (DRISDOL) 50000 units CAPS capsule Take 50,000 Units by mouth every Saturday.  3  . cyclobenzaprine (FLEXERIL) 10 MG tablet Take 1 tablet (10 mg total) by mouth 3 (three) times daily as needed for muscle spasms. (Patient not taking: Reported on 07/13/2018) 10 tablet 0  . gabapentin (NEURONTIN) 600 MG tablet Take 1 tablet (600 mg total) by mouth daily. (Patient not taking: Reported on 07/13/2018)    . hydrOXYzine (VISTARIL) 50 MG capsule Take one to two capsule daily at bed time for insomnia (Patient not taking: Reported on 07/13/2018) 45 capsule  1    Home: [place] expects to be discharged to:: Private residence Living Arrangements: Alone, Other (Comment)(pt reported she lives alone; no family/caregivers to confirm) Additional Comments: Unsure - pt with new cognitive deficits (expressive and receptive difficulties) - no family/caregivers present to provide reliable information.  Functional History: Prior Function Level of Independence: Independent with assistive device(s) Comments: Pt reports yes to RW and SPC, no family/caregivers present during eval to provide reliable information Functional Status:  Mobility: Bed Mobility Overal bed mobility: Needs Assistance Bed Mobility: Supine to Sit Supine to sit: Supervision Sit to supine: Supervision General bed mobility comments: supervision for safety Transfers Overall transfer level: Needs assistance Equipment used: None Transfers: Sit to/from Stand Sit to Stand: Min guard General transfer comment: Pt stood impulsively once in standing required min assistance to maintain balance.   Ambulation/Gait Ambulation/Gait assistance: Min assist Gait Distance (Feet): 80 Feet Assistive device: None Gait Pattern/deviations: Step-through pattern, Decreased step length - right, Decreased step length - left, Decreased stride length, Drifts right/left, Staggering right, Staggering left General Gait Details: Pt required increased assistance with turning and backing, with head turns patient had to stop completely.  Cues for armswing on L, RUE posture in flexion.   Gait velocity: decreased    ADL: ADL Overall ADL's : Needs assistance/impaired Upper Body Bathing: Min guard, Sitting Lower Body Bathing: Minimal assistance, Sit to/from stand Upper Body Dressing : Minimal assistance, Sitting Lower Body Dressing: Moderate assistance, Sit to/from stand Toilet Transfer: Minimal assistance, Moderate assistance, Ambulation, Cueing for sequencing, Cueing for safety Functional  mobility during ADLs: Minimal assistance, Moderate assistance General ADL Comments: Pt demonstrating difficulty with self-care tasks requiring min guard for sitting balance during LB dressing task and requiring mod multimodal cues due to aphasia and apraxia.  Cognition: Cognition Overall Cognitive Status: Impaired/Different from baseline Arousal/Alertness: Awake/alert Orientation Level: Oriented to person, Oriented to place, Disoriented to time, Disoriented to situation Attention: Sustained Sustained Attention: Appears intact Awareness: Impaired Problem Solving: Impaired Problem Solving Impairment: Verbal basic, Functional basic Cognition Arousal/Alertness: Awake/alert Behavior During Therapy: WFL for tasks assessed/performed, Restless Overall Cognitive Status: Impaired/Different from baseline Area of Impairment: Orientation, Attention, Memory, Following commands, Safety/judgement, Awareness, Problem solving Orientation Level: Disoriented to, Time, Situation Current Attention Level: Focused Memory: Decreased short-term memory Following Commands: Follows one step commands inconsistently, Follows one step commands with increased time Safety/Judgement: Decreased awareness of safety, Decreased awareness of deficits Awareness: Intellectual Problem Solving: Slow processing, Decreased initiation, Difficulty sequencing, Requires verbal cues, Requires tactile cues General Comments: pt with aphasia, often repeating therapist, was able to report she had a stroke. She could not follow commands to wash hands and required step by step instruction to turn on water, use soap, rinse and dry hands.  Blood pressure (!) 156/91, pulse 80, temperature 98 F (36.7 C), temperature source Oral, resp. rate 17, height 5\' 4"  (1.626 m), weight 60 kg, SpO2 98 %. Physical Exam  Constitutional: She appears well-developed.  HENT:  Head: Normocephalic.  Eyes: Pupils are equal, round, and reactive to light.  Neck:  Normal range of motion.  Cardiovascular: Normal rate.  Respiratory: Effort normal.  GI: Soft.  Neurological:  Right upper extremity notable for flexion contractures of the wrist and fingers.  Elbow and shoulder are much more movable.  Proximally in the right upper extremity she is grossly 1-2 out of 5.  Right lower extremity grossly 3 to 3+ out of 5 with apraxia noted.  Left upper and left lower extremity grossly 4 out of 5 proximal and distal.  Patient with notable word finding deficits and expressive language deficits.  She seems to demonstrate comprehension and follows simple commands.  Communicates typically with short phrases or words.  Able to self correct to a certain extent.  Skin: Skin is warm.  Psychiatric: She has a normal mood and affect. Her behavior is normal.    Results for orders placed or performed during the hospital encounter of 07/13/18 (from the past 24 hour(s))  Basic metabolic panel     Status: Abnormal   Collection Time: 07/15/18  5:33 AM  Result Value Ref Range   Sodium 138 135 - 145 mmol/L   Potassium 4.9 3.5 - 5.1 mmol/L   Chloride 107 98 - 111 mmol/L   CO2 25 22 - 32 mmol/L   Glucose, Bld 102 (H) 70 - 99 mg/dL   BUN 16 6 - 20 mg/dL   Creatinine, Ser 0.72 0.44 - 1.00 mg/dL   Calcium 8.8 (L) 8.9 - 10.3 mg/dL   GFR calc non Af Amer >60 >60 mL/min   GFR calc Af Amer >60 >60 mL/min   Anion gap 6 5 - 15   Ct Angio Head W Or Wo Contrast  Result Date: 07/14/2018 CLINICAL DATA:  RIGHT-sided weakness, numbness and tingling for 2 days, aphasia. History of stroke, hypertension, hyperlipidemia. EXAM: CT ANGIOGRAPHY HEAD AND NECK TECHNIQUE: Multidetector CT imaging of the head and neck was performed using the standard protocol during bolus administration of intravenous contrast. Multiplanar CT image reconstructions and MIPs were obtained to evaluate the vascular anatomy. Carotid stenosis measurements (when applicable) are obtained utilizing NASCET criteria, using the distal  internal carotid diameter as the denominator. CONTRAST:  2mL ISOVUE-370 IOPAMIDOL (ISOVUE-370) INJECTION 76% COMPARISON:  MRI head July 13, 2018 and CT angiogram head and neck March 04, 2017 FINDINGS: CTA NECK FINDINGS: AORTIC ARCH: Aortic arch is normal course and caliber with moderate intimal thickening and calcific atherosclerosis. Patent arch vessel origins. Moderate luminal irregularity bilateral subclavian arteries compatible with atherosclerosis, moderate to severe stenosis LEFT proximal subclavian artery. RIGHT CAROTID SYSTEM: Moderate to severe stenosis proximal LEFT common carotid artery due to intimal thickening. Mild stenosis mid to distal LEFT common carotid artery. Chronically occluded LEFT internal carotid artery origin without reconstitution within the neck. LEFT CAROTID SYSTEM: Patent common carotid artery with mild luminal irregularity compatible with atherosclerosis. Calcific atherosclerosis resulting in 50% stenosis RIGHT ICA due to by NASCET criteria. Patent internal carotid artery. 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 Severe stenosis RIGHT vertebral artery origin, patent. SKELETON: No acute osseous process though bone windows have not been submitted. OTHER NECK: Soft tissues of the neck are nonacute though, not tailored for evaluation. Old moderate C7 and mild  T2 compression fractures. Patient is edentulous. UPPER CHEST: Mild centrilobular emphysema and apical bullous changes. No superior mediastinal lymphadenopathy. CTA HEAD FINDINGS: ANTERIOR CIRCULATION: Patent RIGHT internal carotid artery with mild luminal irregularity compatible with atherosclerosis. Occluded LEFT ICA with reconstitution supraclinoid segment via retrograde flow LEFT ophthalmic artery. Patent anterior communicating artery. Moderate tandem stenosis LEFT ACA. Patent middle cerebral arteries with mild luminal irregularity. No contrast extravasation or  aneurysm. POSTERIOR CIRCULATION: Patent vertebral arteries, vertebrobasilar junction and basilar artery, as well as main branch vessels. Patent posterior cerebral arteries. Robust RIGHT and small LEFT posterior communicating arteries are present. Mild to moderate tandem stenosis mid to distal PCA. No large vessel occlusion, significant stenosis, contrast extravasation or aneurysm. VENOUS SINUSES: Major dural venous sinuses are patent though not tailored for evaluation on this angiographic examination. ANATOMIC VARIANTS: None. DELAYED PHASE: No abnormal intracranial enhancement. MIP images reviewed. IMPRESSION: CTA NECK: 1. Chronically occluded LEFT ICA.  50% stenosis RIGHT ICA. 2. 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 Severe stenosis RIGHT vertebral artery origin. CTA HEAD: 1. No emergent large vessel occlusion. Similar reconstitution LEFT ICA at supraclinoid segment. 2. Advanced intracranial atherosclerosis. Moderate stenoses LEFT ACA and mild-to-moderate stenoses bilateral PCA. Electronically Signed   By: Elon Alas M.D.   On: 07/14/2018 05:19   Dg Chest 2 View  Result Date: 07/13/2018 CLINICAL DATA:  CVA. Right side weakness, numbness and tingling x2 days. Hx of HTN, stroke, ETOH abuse. EXAM: CHEST - 2 VIEW COMPARISON:  05/15/2018 FINDINGS: Stable linear opacities in the lung bases. No new infiltrate or overt edema. Heart size upper limits normal. No effusion. Visualized bones unremarkable. IMPRESSION: No acute cardiopulmonary disease. Electronically Signed   By: Lucrezia Europe M.D.   On: 07/13/2018 20:33   Ct Angio Neck W Or Wo Contrast  Result Date: 07/14/2018 CLINICAL DATA:  RIGHT-sided weakness, numbness and tingling for 2 days, aphasia. History of stroke, hypertension, hyperlipidemia. EXAM: CT ANGIOGRAPHY HEAD AND NECK TECHNIQUE: Multidetector CT imaging of the head and neck was performed using the standard protocol during bolus administration of intravenous  contrast. Multiplanar CT image reconstructions and MIPs were obtained to evaluate the vascular anatomy. Carotid stenosis measurements (when applicable) are obtained utilizing NASCET criteria, using the distal internal carotid diameter as the denominator. CONTRAST:  56mL ISOVUE-370 IOPAMIDOL (ISOVUE-370) INJECTION 76% COMPARISON:  MRI head July 13, 2018 and CT angiogram head and neck March 04, 2017 FINDINGS: CTA NECK FINDINGS: AORTIC ARCH: Aortic arch is normal course and caliber with moderate intimal thickening and calcific atherosclerosis. Patent arch vessel origins. Moderate luminal irregularity bilateral subclavian arteries compatible with atherosclerosis, moderate to severe stenosis LEFT proximal subclavian artery. RIGHT CAROTID SYSTEM: Moderate to severe stenosis proximal LEFT common carotid artery due to intimal thickening. Mild stenosis mid to distal LEFT common carotid artery. Chronically occluded LEFT internal carotid artery origin without reconstitution within the neck. LEFT CAROTID SYSTEM: Patent common carotid artery with mild luminal irregularity compatible with atherosclerosis. Calcific atherosclerosis resulting in 50% stenosis RIGHT ICA due to by NASCET criteria. Patent internal carotid artery. 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 Severe stenosis RIGHT vertebral artery origin, patent. SKELETON: No acute osseous process though bone windows have not been submitted. OTHER NECK: Soft tissues of the neck are nonacute though, not tailored for evaluation. Old moderate C7 and mild T2 compression fractures. Patient is edentulous. UPPER CHEST: Mild centrilobular emphysema and apical bullous changes. No superior mediastinal lymphadenopathy. CTA HEAD FINDINGS: ANTERIOR CIRCULATION: Patent RIGHT internal carotid artery  with mild luminal irregularity compatible with atherosclerosis. Occluded LEFT ICA with reconstitution supraclinoid segment via  retrograde flow LEFT ophthalmic artery. Patent anterior communicating artery. Moderate tandem stenosis LEFT ACA. Patent middle cerebral arteries with mild luminal irregularity. No contrast extravasation or aneurysm. POSTERIOR CIRCULATION: Patent vertebral arteries, vertebrobasilar junction and basilar artery, as well as main branch vessels. Patent posterior cerebral arteries. Robust RIGHT and small LEFT posterior communicating arteries are present. Mild to moderate tandem stenosis mid to distal PCA. No large vessel occlusion, significant stenosis, contrast extravasation or aneurysm. VENOUS SINUSES: Major dural venous sinuses are patent though not tailored for evaluation on this angiographic examination. ANATOMIC VARIANTS: None. DELAYED PHASE: No abnormal intracranial enhancement. MIP images reviewed. IMPRESSION: CTA NECK: 1. Chronically occluded LEFT ICA.  50% stenosis RIGHT ICA. 2. 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 Severe stenosis RIGHT vertebral artery origin. CTA HEAD: 1. No emergent large vessel occlusion. Similar reconstitution LEFT ICA at supraclinoid segment. 2. Advanced intracranial atherosclerosis. Moderate stenoses LEFT ACA and mild-to-moderate stenoses bilateral PCA. Electronically Signed   By: Elon Alas M.D.   On: 07/14/2018 05:19   Mr Brain Wo Contrast  Result Date: 07/13/2018 CLINICAL DATA:  57 y/o F; right-sided weakness, numbness, and tingling for 2 days as well as aphasia. History of CVA with right-sided deficits. EXAM: MRI HEAD WITHOUT CONTRAST TECHNIQUE: Multiplanar, multiecho pulse sequences of the brain and surrounding structures were obtained without intravenous contrast. COMPARISON:  07/13/2018 CT head.  03/02/2017 MRI head. FINDINGS: Brain: 2 punctate foci of reduced diffusion compatible with acute/early subacute infarction within the left temporal lobe, additional focus within the left occipitotemporal junction, and additional focus within  the left medial parietal cortex (series 5, image 51, 52, 66). Several stable very small chronic infarctions within the bilateral cerebellar hemispheres, multiple cortical infarctions in left cerebral hemisphere in a watershed distribution, and additional small cortical infarctions within the right frontal and parietal lobes. Chronic microvascular ischemic changes of white matter and volume loss of the brain are stable. 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 Vascular: Chronic left ICA occlusion. Skull and upper cervical spine: Normal marrow signal. Sinuses/Orbits: Negative. Other: None. IMPRESSION: 1. Four punctate foci of acute/early subacute infarction within left temporal lobe, left occipitotemporal junction, and the left parietal lobe. No associated hemorrhage or mass effect. 2. Multiple stable chronic infarctions, microvascular ischemic changes, and volume loss of the brain. These results were called by telephone at the time of interpretation on 07/13/2018 at 2:51 pm to Dr. Gareth Morgan , who verbally acknowledged these results. Electronically Signed   By: Kristine Garbe M.D.   On: 07/13/2018 14:52    Assessment/Plan: Diagnosis: Left temporal, parietal, and occipital lobe infarcts with right hemiparesis and expressive aphasia. 1. Does the need for close, 24 hr/day medical supervision in concert with the patient's rehab needs make it unreasonable for this patient to be served in a less intensive setting? Yes 2. Co-Morbidities requiring supervision/potential complications:  hemiparesis and expressive aphasia. 3. Due to bladder management, bowel management, safety, skin/wound care, disease management, medication administration, pain management and patient education, does the patient require  24 hr/day rehab nursing? Yes 4. Does the patient require coordinated care of a physician, rehab nurse, PT (1-2 hrs/day, 5 days/week), OT (1-2 hrs/day, 5 days/week) and SLP (1-2 hrs/day, 5 days/week) to address physical and functional deficits in  the context of the above medical diagnosis(es)? Yes Addressing deficits in the following areas: balance, endurance, locomotion, strength, transferring, bowel/bladder control, bathing, dressing, feeding, grooming, toileting, cognition, speech, language and psychosocial support 5. Can the patient actively participate in an intensive therapy program of at least 3 hrs of therapy per day at least 5 days per week? Yes 6. The potential for patient to make measurable gains while on inpatient rehab is excellent 7. Anticipated functional outcomes upon discharge from inpatient rehab are modified independent and supervision  with PT, modified independent and supervision with OT, modified independent, supervision and min assist with SLP. 8. Estimated rehab length of stay to reach the above functional goals is: 9-15 days 9. Anticipated D/C setting: Home 10. Anticipated post D/C treatments: [place] therapy 11. Overall Rehab/Functional Prognosis: excellent  RECOMMENDATIONS: This patient's condition is appropriate for continued rehabilitative care in the following setting: CIR Patient has agreed to participate in recommended program. Yes Note that insurance prior authorization may be required for reimbursement for recommended care.  Comment: Rehab Admissions Coordinator to follow up.  Thanks,  Meredith Staggers, MD, Mellody Drown  I have personally performed a face to face diagnostic evaluation of this patient.    Meredith Staggers, MD 07/15/2018

## 2018-07-16 LAB — BASIC METABOLIC PANEL
Anion gap: 7 (ref 5–15)
BUN: 18 mg/dL (ref 6–20)
CALCIUM: 9.3 mg/dL (ref 8.9–10.3)
CO2: 26 mmol/L (ref 22–32)
CREATININE: 0.73 mg/dL (ref 0.44–1.00)
Chloride: 105 mmol/L (ref 98–111)
GFR calc Af Amer: >60 mL/min (ref >60)
GFR calc non Af Amer: >60 mL/min (ref >60)
GLUCOSE: 96 mg/dL (ref 70–99)
Potassium: 4.7 mmol/L (ref 3.5–5.1)
Sodium: 138 mmol/L (ref 135–145)

## 2018-07-16 MED ORDER — THIAMINE HCL 100 MG PO TABS: 100.0000 mg | ORAL_TABLET | Freq: Every day | ORAL | 0 refills | Status: AC

## 2018-07-16 MED ORDER — POLYETHYLENE GLYCOL 3350 17 G PO PACK: 17.0000 g | PACK | Freq: Every day | ORAL | 0 refills | Status: DC | PRN

## 2018-07-16 MED ORDER — ATORVASTATIN CALCIUM 40 MG PO TABS: 40.0000 mg | ORAL_TABLET | Freq: Every day | ORAL | 0 refills | Status: DC

## 2018-07-16 MED ORDER — LEVETIRACETAM 500 MG PO TABS: 500.0000 mg | ORAL_TABLET | Freq: Two times a day (BID) | ORAL | 0 refills | Status: AC

## 2018-07-16 MED ORDER — CLOPIDOGREL BISULFATE 75 MG PO TABS: 75.0000 mg | ORAL_TABLET | Freq: Every day | ORAL | 0 refills | Status: AC

## 2018-07-16 NOTE — Progress Notes (Signed)
Pt has recommendations to discharge to CIR. Pt states that she doesn't need it and doesn't want to go. Pt's husband also wants to take Pt home. RN notified MD Reesa Chew) and MD advised that if pt is refusing to go to CIR she can go home. MD advised that he would fix D/C order to discharge home instead of CIR.

## 2018-07-16 NOTE — Progress Notes (Signed)
NURSING PROGRESS NOTE  Kelsey Wade [id number] Discharge Data: 07/16/2018 2:31 PM Attending Provider: Damita Lack, MD [id number], Lowella Fairy, FNP     Javier Docker to be D/C'd Home per MD order.  Discussed with the patient the After Visit Summary and all questions fully answered. All IV's discontinued with no bleeding noted. All belongings returned to patient for patient to take home.   Last Vital Signs:  Blood pressure (!) 152/90, pulse 78, temperature 97.9 F (36.6 C), temperature source Oral, resp. rate 17, height 5\' 4"  (1.626 m), weight 60 kg, SpO2 97 %.  Discharge Medication List Allergies as of 07/16/2018   No Known Allergies     Medication List    STOP taking these medications   cyclobenzaprine 10 MG tablet Commonly known as:  FLEXERIL   hydrOXYzine 25 MG capsule Commonly known as:  VISTARIL   hydrOXYzine 50 MG capsule Commonly known as:  VISTARIL   oxyCODONE 15 MG immediate release tablet Commonly known as:  ROXICODONE     TAKE these medications   amLODipine 10 MG tablet Commonly known as:  NORVASC Take 10 mg by mouth daily.   aspirin 325 MG tablet Take 325 mg by mouth daily.   atorvastatin 40 MG tablet Commonly known as:  LIPITOR Take 1 tablet (40 mg total) by mouth daily at 6 PM. What changed:  when to take this   clopidogrel 75 MG tablet Commonly known as:  PLAVIX Take 1 tablet (75 mg total) by mouth daily. Start taking on:  07/17/2018   famotidine 40 MG tablet Commonly known as:  PEPCID Take 1 tablet (40 mg total) by mouth at bedtime. What changed:  when to take this   folic acid 1 MG tablet Commonly known as:  FOLVITE Take 1 tablet (1 mg total) by mouth daily.   gabapentin 300 MG capsule Commonly known as:  NEURONTIN Take 300 mg by mouth daily after breakfast. What changed:  Another medication with the same name was removed. Continue taking this medication, and follow the directions you see here.   hydrOXYzine 10 MG  tablet Commonly known as:  ATARAX/VISTARIL Take 10 mg by mouth daily as needed for anxiety.   levETIRAcetam 500 MG tablet Commonly known as:  KEPPRA Take 1 tablet (500 mg total) by mouth 2 (two) times daily.   multivitamin with minerals Tabs tablet Take 1 tablet by mouth daily.   polyethylene glycol packet Commonly known as:  MIRALAX / GLYCOLAX Take 17 g by mouth daily as needed for moderate constipation or severe constipation.   PROAIR HFA 108 (90 Base) MCG/ACT inhaler Generic drug:  albuterol Inhale 2 puffs into the lungs every 6 (six) hours as needed for wheezing or shortness of breath.   thiamine 100 MG tablet Take 1 tablet (100 mg total) by mouth daily. Start taking on:  07/17/2018   tiZANidine 2 MG tablet Commonly known as:  ZANAFLEX Take 1 tablet (2 mg total) by mouth 3 (three) times daily.   venlafaxine XR 150 MG 24 hr capsule Commonly known as:  EFFEXOR-XR Take 1 capsule (150 mg total) by mouth daily with breakfast.   Vitamin D (Ergocalciferol) 50000 units Caps capsule Commonly known as:  DRISDOL Take 50,000 Units by mouth every Saturday.

## 2018-07-16 NOTE — Discharge Summary (Addendum)
Physician Discharge Summary  Kelsey Wade BTD:[id number] DOB: August 17, 1961 DOA: 07/13/2018  PCP: Elenore Paddy, FNP  Admit date: 07/13/2018 Discharge date: 07/16/2018  Admitted From: Home Disposition: Patient refused to go to CIR or inpatient rehabilitation.  Husband and patient insisted that they want to go home therefore discharged home.  Recommendations for Outpatient Follow-up:  1. Follow up with PCP in 1-2 weeks 2. Please obtain BMP/CBC in one week your next doctors visit.  3. Aspirin 325 mg and Plavix 75 mg for 3 months followed by only 1 of these thereafter 4. Follow-up with outpatient neurology and 3-4 weeks 5. Keppra 500 mg twice daily 6. Atorvastatin 40 mg daily 7. Bowel regimen given to be taken as needed 8. Multivitamins and thiamine given.  Advised to quit using alcohol.  Also advised to quit using tobacco  Discharge Condition: Stable CODE STATUS: Full code Diet recommendation: Cardiac  Brief/Interim Summary: 57 year old with a history of previous CVA with right-sided spastic hemiplegia, active tobacco use, alcohol use, hyperlipidemia, chronic left-sided carotid artery occlusion, essential hypertension was brought to the hospital for evaluation due to right upper extremity weakness and jerking.  MRI of the brain showed acute/subacute left temporal lobe, left occipitotemporal junction and left temporal lobe infarct.  Due to concerns of some shaking patient was also started on IV Keppra.  Neurology was consulted.  EEG done which was suggestive of mild to moderate generalized cerebral dysfunction.  Physical therapy recommended CIR admission.  Speech and swallow team recommended regular thin liquid with mild aspiration precaution. She was advised to take aspirin and Plavix for 3 months followed by either 1 of those thereafter.  Needs to follow-up with outpatient neurology in 3 to 4 weeks.  Continue Keppra 500 mg twice daily for now.  On atorvastatin 40 mg daily.  At this  time patient has reached maximum benefit from hospital stay.   Discharge Diagnoses:  Principal Problem:   CVA (cerebral vascular accident) ([place]) Active Problems:   Essential hypertension   Tobacco dependence   Hyperlipidemia LDL goal <100   Chronic pain   ETOH abuse  Acute CVA with left temporal lobe infarct Slurred speech, improved - Suspect from hypoperfusion due to ICA occlusion. - MRI of the brain is positive for acute/subacute left temporal lobe infarct. need to focus on avoiding hypotension -CTA of the head and neck- chronically occluded left ICA 50% on the right chronically occluded left vertebral artery.  Advanced intracranial atherosclerosis -Continue aspirin turn 25 mg and Plavix 75 mg daily for 3 months followed by single agent thereafter. - Speech and swallow evaluation- regular diet with mild aspiration precaution -PT/OT evaluation- recommending CIR but patient and family refused and wanted to go home therefore discharged home. - Hemoglobin A1c 5.9, LDL is 98 - Vitamin B12-elevated, TSH within normal limits, folate level-pending  Partial seizure disorder, resolved - EEG was negative for any seizure activity, shows generalized slowing.  Continue Keppra 500 mg twice daily.  Can be discontinued outpatient once seen by neurology  Essential hypertension - Currently on hold due to permissive hypertension in the setting of hypoperfusion.  Hyperlipidemia -Continue atorvastatin 40 mg daily  Chronic pain - Continue home regimen of oxycodone 15 mg every 6 hours.  Gabapentin 300 mg daily, Zanaflex 2 mg 3 times daily - Bowel regimen as needed  History of depression -Continue Effexor  Alcohol use and tobacco use -Nicotine patch if needed.  Alcohol withdrawal protocol in place. -Counseled to quit using alcohol and tobacco. -Vitamins started  On Lovenox  for DVT prophylaxis while she was here She is a full code Discharged home at the request of the patient and the  husband.  Discharge Instructions   Allergies as of 07/16/2018   No Known Allergies     Medication List    STOP taking these medications   cyclobenzaprine 10 MG tablet Commonly known as:  FLEXERIL   hydrOXYzine 25 MG capsule Commonly known as:  VISTARIL   hydrOXYzine 50 MG capsule Commonly known as:  VISTARIL   oxyCODONE 15 MG immediate release tablet Commonly known as:  ROXICODONE     TAKE these medications   amLODipine 10 MG tablet Commonly known as:  NORVASC Take 10 mg by mouth daily.   aspirin 325 MG tablet Take 325 mg by mouth daily.   atorvastatin 40 MG tablet Commonly known as:  LIPITOR Take 1 tablet (40 mg total) by mouth daily at 6 PM. What changed:  when to take this   clopidogrel 75 MG tablet Commonly known as:  PLAVIX Take 1 tablet (75 mg total) by mouth daily. Start taking on:  07/17/2018   famotidine 40 MG tablet Commonly known as:  PEPCID Take 1 tablet (40 mg total) by mouth at bedtime. What changed:  when to take this   folic acid 1 MG tablet Commonly known as:  FOLVITE Take 1 tablet (1 mg total) by mouth daily.   gabapentin 300 MG capsule Commonly known as:  NEURONTIN Take 300 mg by mouth daily after breakfast. What changed:  Another medication with the same name was removed. Continue taking this medication, and follow the directions you see here.   hydrOXYzine 10 MG tablet Commonly known as:  ATARAX/VISTARIL Take 10 mg by mouth daily as needed for anxiety.   levETIRAcetam 500 MG tablet Commonly known as:  KEPPRA Take 1 tablet (500 mg total) by mouth 2 (two) times daily.   multivitamin with minerals Tabs tablet Take 1 tablet by mouth daily.   polyethylene glycol packet Commonly known as:  MIRALAX / GLYCOLAX Take 17 g by mouth daily as needed for moderate constipation or severe constipation.   PROAIR HFA 108 (90 Base) MCG/ACT inhaler Generic drug:  albuterol Inhale 2 puffs into the lungs every 6 (six) hours as needed for  wheezing or shortness of breath.   thiamine 100 MG tablet Take 1 tablet (100 mg total) by mouth daily. Start taking on:  07/17/2018   tiZANidine 2 MG tablet Commonly known as:  ZANAFLEX Take 1 tablet (2 mg total) by mouth 3 (three) times daily.   venlafaxine XR 150 MG 24 hr capsule Commonly known as:  EFFEXOR-XR Take 1 capsule (150 mg total) by mouth daily with breakfast.   Vitamin D (Ergocalciferol) 50000 units Caps capsule Commonly known as:  DRISDOL Take 50,000 Units by mouth every Saturday.      Follow-up Information    Elenore Paddy, FNP. Schedule an appointment as soon as possible for a visit in 1 week(s).   Specialty:  Family Medicine Contact information: [place] [address] [phone]        [place]. Schedule an appointment as soon as possible for a visit in 3 week(s).   Contact information: [address]     [place] [id number] [phone]         No Known Allergies  You were cared for by a hospitalist during your hospital stay. If you have any questions about your discharge medications or the care you  received while you were in the hospital after you are discharged, you can call the unit and asked to speak with the hospitalist on call if the hospitalist that took care of you is not available. Once you are discharged, your primary care physician will handle any further medical issues. Please note that no refills for any discharge medications will be authorized once you are discharged, as it is imperative that you return to your primary care physician (or establish a relationship with a primary care physician if you do not have one) for your aftercare needs so that they can reassess your need for medications and monitor your lab values.  Consultations:  Neurology  PMNR   Procedures/Studies: Ct Angio Head W Or Wo Contrast  Result Date: 07/14/2018 CLINICAL DATA:  RIGHT-sided  weakness, numbness and tingling for 2 days, aphasia. History of stroke, hypertension, hyperlipidemia. EXAM: CT ANGIOGRAPHY HEAD AND NECK TECHNIQUE: Multidetector CT imaging of the head and neck was performed using the standard protocol during bolus administration of intravenous contrast. Multiplanar CT image reconstructions and MIPs were obtained to evaluate the vascular anatomy. Carotid stenosis measurements (when applicable) are obtained utilizing NASCET criteria, using the distal internal carotid diameter as the denominator. CONTRAST:  78mL ISOVUE-370 IOPAMIDOL (ISOVUE-370) INJECTION 76% COMPARISON:  MRI head July 13, 2018 and CT angiogram head and neck March 04, 2017 FINDINGS: CTA NECK FINDINGS: AORTIC ARCH: Aortic arch is normal course and caliber with moderate intimal thickening and calcific atherosclerosis. Patent arch vessel origins. Moderate luminal irregularity bilateral subclavian arteries compatible with atherosclerosis, moderate to severe stenosis LEFT proximal subclavian artery. RIGHT CAROTID SYSTEM: Moderate to severe stenosis proximal LEFT common carotid artery due to intimal thickening. Mild stenosis mid to distal LEFT common carotid artery. Chronically occluded LEFT internal carotid artery origin without reconstitution within the neck. LEFT CAROTID SYSTEM: Patent common carotid artery with mild luminal irregularity compatible with atherosclerosis. Calcific atherosclerosis resulting in 50% stenosis RIGHT ICA due to by NASCET criteria. Patent internal carotid artery. 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 Severe stenosis RIGHT vertebral artery origin, patent. SKELETON: No acute osseous process though bone windows have not been submitted. OTHER NECK: Soft tissues of the neck are nonacute though, not tailored for evaluation. Old moderate C7 and mild T2 compression fractures. Patient is edentulous. UPPER CHEST: Mild centrilobular emphysema  and apical bullous changes. No superior mediastinal lymphadenopathy. CTA HEAD FINDINGS: ANTERIOR CIRCULATION: Patent RIGHT internal carotid artery with mild luminal irregularity compatible with atherosclerosis. Occluded LEFT ICA with reconstitution supraclinoid segment via retrograde flow LEFT ophthalmic artery. Patent anterior communicating artery. Moderate tandem stenosis LEFT ACA. Patent middle cerebral arteries with mild luminal irregularity. No contrast extravasation or aneurysm. POSTERIOR CIRCULATION: Patent vertebral arteries, vertebrobasilar junction and basilar artery, as well as main branch vessels. Patent posterior cerebral arteries. Robust RIGHT and small LEFT posterior communicating arteries are present. Mild to moderate tandem stenosis mid to distal PCA. No large vessel occlusion, significant stenosis, contrast extravasation or aneurysm. VENOUS SINUSES: Major dural venous sinuses are patent though not tailored for evaluation on this angiographic examination. ANATOMIC VARIANTS: None. DELAYED PHASE: No abnormal intracranial enhancement. MIP images reviewed. IMPRESSION: CTA NECK: 1. Chronically occluded LEFT ICA.  50% stenosis RIGHT ICA. 2. 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 Severe stenosis RIGHT vertebral artery origin. CTA HEAD: 1. No emergent large vessel occlusion. Similar reconstitution LEFT ICA at supraclinoid segment. 2. Advanced intracranial atherosclerosis. Moderate stenoses LEFT ACA and mild-to-moderate stenoses bilateral PCA. Electronically Signed  By: Elon Alas M.D.   On: 07/14/2018 05:19   Dg Chest 2 View  Result Date: 07/13/2018 CLINICAL DATA:  CVA. Right side weakness, numbness and tingling x2 days. Hx of HTN, stroke, ETOH abuse. EXAM: CHEST - 2 VIEW COMPARISON:  05/15/2018 FINDINGS: Stable linear opacities in the lung bases. No new infiltrate or overt edema. Heart size upper limits normal. No effusion. Visualized bones unremarkable.  IMPRESSION: No acute cardiopulmonary disease. Electronically Signed   By: Lucrezia Europe M.D.   On: 07/13/2018 20:33   Ct Head Wo Contrast  Result Date: 07/13/2018 CLINICAL DATA:  Right-sided weakness and numbness.  Aphasia EXAM: CT HEAD WITHOUT CONTRAST TECHNIQUE: Contiguous axial images were obtained from the base of the skull through the vertex without intravenous contrast. COMPARISON:  December 22, 2017 FINDINGS: Brain: The ventricles are normal in size and configuration. There is no intracranial mass, hemorrhage, extra-axial fluid collection, or midline shift. There is a prior infarct involving the superior left frontal lobe as well as portions of the left parietal lobe, stable. There is a prior infarct in the superomedial left occipital lobe. There is evidence of a prior infarct in the superior mid to posterior right frontal lobe as well as in a portion of the posterior right parietal lobe. There is evidence of a prior white matter infarct in the posterior right centrum semiovale. There are prior small infarcts in the posterior mid right cerebellum and in the mid cerebellar regions bilaterally. There is a prior small infarct in the anterior left centrum semiovale adjacent to the frontal horn of the left lateral ventricle. No convincing acute infarct is evident on the current examination. Vascular: There is no appreciable hyperdense vessel. There is calcification in each carotid siphon region. Skull: The bony calvarium appears intact. Sinuses/Orbits: There is mucosal thickening in several ethmoid air cells. Other paranasal sinuses are clear. Orbits appear symmetric bilaterally. Other: Mastoid air cells are clear. IMPRESSION: 1. Multifocal supratentorial and infratentorial infarcts, more severe on the left than on the right, essentially stable compared to most recent study. No acute infarct is appreciable currently. It should be noted that a subtle infarct on the left side given the other changes could be easily  obscured. In this regard, it may be reasonable to consider MR with diffusion imaging to further assess. 2.  No mass or hemorrhage is appreciable. 3.  There are foci of arterial vascular calcification. 4.  Mild mucosal thickening noted in several ethmoid air cells. Electronically Signed   By: Lowella Grip III M.D.   On: 07/13/2018 12:15   Ct Angio Neck W Or Wo Contrast  Result Date: 07/14/2018 CLINICAL DATA:  RIGHT-sided weakness, numbness and tingling for 2 days, aphasia. History of stroke, hypertension, hyperlipidemia. EXAM: CT ANGIOGRAPHY HEAD AND NECK TECHNIQUE: Multidetector CT imaging of the head and neck was performed using the standard protocol during bolus administration of intravenous contrast. Multiplanar CT image reconstructions and MIPs were obtained to evaluate the vascular anatomy. Carotid stenosis measurements (when applicable) are obtained utilizing NASCET criteria, using the distal internal carotid diameter as the denominator. CONTRAST:  47mL ISOVUE-370 IOPAMIDOL (ISOVUE-370) INJECTION 76% COMPARISON:  MRI head July 13, 2018 and CT angiogram head and neck March 04, 2017 FINDINGS: CTA NECK FINDINGS: AORTIC ARCH: Aortic arch is normal course and caliber with moderate intimal thickening and calcific atherosclerosis. Patent arch vessel origins. Moderate luminal irregularity bilateral subclavian arteries compatible with atherosclerosis, moderate to severe stenosis LEFT proximal subclavian artery. RIGHT CAROTID SYSTEM: Moderate to severe stenosis  proximal LEFT common carotid artery due to intimal thickening. Mild stenosis mid to distal LEFT common carotid artery. Chronically occluded LEFT internal carotid artery origin without reconstitution within the neck. LEFT CAROTID SYSTEM: Patent common carotid artery with mild luminal irregularity compatible with atherosclerosis. Calcific atherosclerosis resulting in 50% stenosis RIGHT ICA due to by NASCET criteria. Patent internal carotid artery.  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 Severe stenosis RIGHT vertebral artery origin, patent. SKELETON: No acute osseous process though bone windows have not been submitted. OTHER NECK: Soft tissues of the neck are nonacute though, not tailored for evaluation. Old moderate C7 and mild T2 compression fractures. Patient is edentulous. UPPER CHEST: Mild centrilobular emphysema and apical bullous changes. No superior mediastinal lymphadenopathy. CTA HEAD FINDINGS: ANTERIOR CIRCULATION: Patent RIGHT internal carotid artery with mild luminal irregularity compatible with atherosclerosis. Occluded LEFT ICA with reconstitution supraclinoid segment via retrograde flow LEFT ophthalmic artery. Patent anterior communicating artery. Moderate tandem stenosis LEFT ACA. Patent middle cerebral arteries with mild luminal irregularity. No contrast extravasation or aneurysm. POSTERIOR CIRCULATION: Patent vertebral arteries, vertebrobasilar junction and basilar artery, as well as main branch vessels. Patent posterior cerebral arteries. Robust RIGHT and small LEFT posterior communicating arteries are present. Mild to moderate tandem stenosis mid to distal PCA. No large vessel occlusion, significant stenosis, contrast extravasation or aneurysm. VENOUS SINUSES: Major dural venous sinuses are patent though not tailored for evaluation on this angiographic examination. ANATOMIC VARIANTS: None. DELAYED PHASE: No abnormal intracranial enhancement. MIP images reviewed. IMPRESSION: CTA NECK: 1. Chronically occluded LEFT ICA.  50% stenosis RIGHT ICA. 2. 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 Severe stenosis RIGHT vertebral artery origin. CTA HEAD: 1. No emergent large vessel occlusion. Similar reconstitution LEFT ICA at supraclinoid segment. 2. Advanced intracranial atherosclerosis. Moderate stenoses LEFT ACA and mild-to-moderate stenoses bilateral PCA.  Electronically Signed   By: Elon Alas M.D.   On: 07/14/2018 05:19   Mr Brain Wo Contrast  Result Date: 07/13/2018 CLINICAL DATA:  57 y/o F; right-sided weakness, numbness, and tingling for 2 days as well as aphasia. History of CVA with right-sided deficits. EXAM: MRI HEAD WITHOUT CONTRAST TECHNIQUE: Multiplanar, multiecho pulse sequences of the brain and surrounding structures were obtained without intravenous contrast. COMPARISON:  07/13/2018 CT head.  03/02/2017 MRI head. FINDINGS: Brain: 2 punctate foci of reduced diffusion compatible with acute/early subacute infarction within the left temporal lobe, additional focus within the left occipitotemporal junction, and additional focus within the left medial parietal cortex (series 5, image 51, 52, 66). Several stable very small chronic infarctions within the bilateral cerebellar hemispheres, multiple cortical infarctions in left cerebral hemisphere in a watershed distribution, and additional small cortical infarctions within the right frontal and parietal lobes. Chronic microvascular ischemic changes of white matter and volume loss of the brain are stable. 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 Vascular: Chronic left ICA occlusion. Skull and upper cervical spine: Normal marrow signal. Sinuses/Orbits: Negative. Other: None. IMPRESSION: 1. Four punctate foci of acute/early subacute infarction within left temporal lobe, left occipitotemporal junction, and the left parietal lobe. No associated hemorrhage or mass effect. 2. Multiple stable chronic infarctions, microvascular ischemic changes, and volume loss of the brain. These results were called by telephone at the time of interpretation on 07/13/2018 at 2:51 pm to Dr. Gareth Morgan , who verbally  acknowledged  these results. Electronically Signed   By: Kristine Garbe M.D.   On: 07/13/2018 14:52      Subjective: Patient continues to insist that she wants to go home and I explained her why inpatient rehab is necessary.  She will speak with her husband and decide what she wants to do.  General = no fevers, chills, dizziness, malaise, fatigue HEENT/EYES = negative for pain, redness, loss of vision, double vision, blurred vision, loss of hearing, sore throat, hoarseness, dysphagia Cardiovascular= negative for chest pain, palpitation, murmurs, lower extremity swelling Respiratory/lungs= negative for shortness of breath, cough, hemoptysis, wheezing, mucus production Gastrointestinal= negative for nausea, vomiting,, abdominal pain, melena, hematemesis Genitourinary= negative for Dysuria, Hematuria, Change in Urinary Frequency MSK = Negative for arthralgia, myalgias, Back Pain, Joint swelling  Neurology= Negative for headache, seizures, numbness, tingling  Psychiatry= Negative for anxiety, depression, suicidal and homocidal ideation Allergy/Immunology= Medication/Food allergy as listed  Skin= Negative for Rash, lesions, ulcers, itching   Discharge Exam: Vitals:   07/16/18 0317 07/16/18 0815  BP: (!) 155/91 (!) 152/90  Pulse: 86 78  Resp: 18 17  Temp: 97.8 F (36.6 C) 97.9 F (36.6 C)  SpO2: 96% 97%   Vitals:   07/15/18 2040 07/15/18 2300 07/16/18 0317 07/16/18 0815  BP: (!) 153/94 (!) 145/90 (!) 155/91 (!) 152/90  Pulse: 79 75 86 78  Resp: 18 18 18 17   Temp: 98 F (36.7 C) 98.5 F (36.9 C) 97.8 F (36.6 C) 97.9 F (36.6 C)  TempSrc: Oral Oral Oral Oral  SpO2:  96% 96% 97%  Weight:      Height:        General: Pt is alert, awake, not in acute distress Cardiovascular: RRR, S1/S2 +, no rubs, no gallops Respiratory: CTA bilaterally, no wheezing, no rhonchi Abdominal: Soft, NT, ND, bowel sounds + Extremities: no edema, no cyanosis Her slurred speech is  improved.  Overall appears relatively weak.  Her gait is not very steady yet either.   The results of significant diagnostics from this hospitalization (including imaging, microbiology, ancillary and laboratory) are listed below for reference.     Microbiology: No results found for this or any previous visit (from the past 240 hour(s)).   Labs: BNP (last 3 results) Recent Labs    01/01/18 2049  BNP 09.4   Basic Metabolic Panel: Recent Labs  Lab 07/13/18 1007 07/13/18 1036 07/13/18 1838 07/15/18 0533 07/16/18 0540  NA 134* 135  --  138 138  K 5.3* 5.2*  --  4.9 4.7  CL 104 104  --  107 105  CO2 24  --   --  25 26  GLUCOSE 98 96  --  102* 96  BUN 39* 39*  --  16 18  CREATININE 1.04* 1.00 0.88 0.72 0.73  CALCIUM 9.1  --   --  8.8* 9.3   Liver Function Tests: Recent Labs  Lab 07/13/18 1007  AST 18  ALT 15  ALKPHOS 77  BILITOT 0.5  PROT 7.2  ALBUMIN 3.9   No results for input(s): LIPASE, AMYLASE in the last 168 hours. No results for input(s): AMMONIA in the last 168 hours. CBC: Recent Labs  Lab 07/13/18 1007 07/13/18 1036 07/13/18 1838  WBC 7.5  --  7.1  NEUTROABS 4.6  --   --   HGB 12.4 13.6 12.4  HCT 41.2 40.0 40.6  MCV 91.8  --  90.0  PLT 313  --  263   Cardiac Enzymes: No results for input(s):  CKTOTAL, CKMB, CKMBINDEX, TROPONINI in the last 168 hours. BNP: Invalid input(s): POCBNP CBG: No results for input(s): GLUCAP in the last 168 hours. D-Dimer No results for input(s): DDIMER in the last 72 hours. Hgb A1c Recent Labs    07/14/18 0658  HGBA1C 5.9*   Lipid Profile Recent Labs    07/14/18 0658  CHOL 167  HDL 46  LDLCALC 98  TRIG 113  CHOLHDL 3.6   Thyroid function studies Recent Labs    07/14/18 1026  TSH 0.432   Anemia work up Recent Labs    07/14/18 1026  VITAMINB12 3,215*   Urinalysis    Component Value Date/Time   COLORURINE YELLOW 07/13/2018 [place] 07/13/2018 1007   LABSPEC 1.014 07/13/2018 1007    PHURINE 5.0 07/13/2018 1007   GLUCOSEU NEGATIVE 07/13/2018 [place] 07/13/2018 1007   BILIRUBINUR NEGATIVE 07/13/2018 1007   KETONESUR NEGATIVE 07/13/2018 1007   PROTEINUR NEGATIVE 07/13/2018 1007   UROBILINOGEN 0.2 08/10/2017 1059   NITRITE NEGATIVE 07/13/2018 1007   LEUKOCYTESUR NEGATIVE 07/13/2018 1007   Sepsis Labs Invalid input(s): PROCALCITONIN,  WBC,  LACTICIDVEN Microbiology No results found for this or any previous visit (from the past 240 hour(s)).   Time coordinating discharge:  I have spent 35 minutes face to face with the patient and on the ward discussing the patients care, assessment, plan and disposition with other care givers. >50% of the time was devoted counseling the patient about the risks and benefits of treatment/Discharge disposition and coordinating care.   SIGNED:   Damita Lack, MD  [facility] 07/16/2018, 9:45 AM Pager   If 7PM-7AM, please contact night-coverage [email or web address] Password [email or web address]

## 2018-07-16 NOTE — Progress Notes (Signed)
Attempted to assess pt this morning at 0800 and pt refused all assessments. Pt became agitated and pushed away bedside table. Pt is laying in bed.

## 2018-07-17 LAB — FOLATE RBC
Folate, RBC: >1685 ng/mL (ref >498)
Hematocrit: 36.8 % (ref 34.0–46.6)

## 2018-08-01 ENCOUNTER — Ambulatory Visit: Payer: Self-pay | Admitting: Physical Medicine & Rehabilitation

## 2018-08-10 ENCOUNTER — Ambulatory Visit (HOSPITAL_BASED_OUTPATIENT_CLINIC_OR_DEPARTMENT_OTHER): Payer: Medicaid Other | Admitting: Physical Medicine & Rehabilitation

## 2018-08-10 ENCOUNTER — Encounter: Payer: Medicaid Other | Attending: Physical Medicine & Rehabilitation

## 2018-08-10 ENCOUNTER — Encounter: Payer: Self-pay | Admitting: Physical Medicine & Rehabilitation

## 2018-08-10 VITALS — BP 148/104 | HR 117 | Ht 64.0 in | Wt 130.0 lb

## 2018-08-10 DIAGNOSIS — G8110 Spastic hemiplegia affecting unspecified side: Secondary | ICD-10-CM | POA: Diagnosis not present

## 2018-08-10 DIAGNOSIS — M1990 Unspecified osteoarthritis, unspecified site: Secondary | ICD-10-CM | POA: Diagnosis not present

## 2018-08-10 DIAGNOSIS — F17210 Nicotine dependence, cigarettes, uncomplicated: Secondary | ICD-10-CM | POA: Insufficient documentation

## 2018-08-10 DIAGNOSIS — E785 Hyperlipidemia, unspecified: Secondary | ICD-10-CM | POA: Insufficient documentation

## 2018-08-10 DIAGNOSIS — G8929 Other chronic pain: Secondary | ICD-10-CM | POA: Insufficient documentation

## 2018-08-10 DIAGNOSIS — I69851 Hemiplegia and hemiparesis following other cerebrovascular disease affecting right dominant side: Secondary | ICD-10-CM | POA: Diagnosis not present

## 2018-08-10 DIAGNOSIS — I10 Essential (primary) hypertension: Secondary | ICD-10-CM | POA: Insufficient documentation

## 2018-08-10 DIAGNOSIS — G8111 Spastic hemiplegia affecting right dominant side: Secondary | ICD-10-CM | POA: Diagnosis present

## 2018-08-10 MED ORDER — TIZANIDINE HCL 4 MG PO TABS: 4.0000 mg | ORAL_TABLET | Freq: Three times a day (TID) | ORAL | 1 refills | Status: DC

## 2018-08-10 MED ORDER — TIZANIDINE HCL 4 MG PO TABS: 2.0000 mg | ORAL_TABLET | Freq: Three times a day (TID) | ORAL | 1 refills | Status: DC

## 2018-08-10 NOTE — Progress Notes (Signed)
Subjective:    Patient ID: Kelsey Wade, female    DOB: 07/15/1961, 57 y.o.   MRN: [id number]  HPI Admitted from MD office to ED after developing speech problems , new Temporal CVA.  Had seizure and was started on Keppra, no functional change per pt and husband, no increased weakness or numbness.  Walking has not changed Boyfriend washes left side due to right hand impairment Myobloc 5000U injected Jun 22, 2018 the patient does not feel much relief with this. Pain Inventory Average Pain 9 Pain Right Now 9 My pain is stabbing and aching  In the last 24 hours, has pain interfered with the following? General activity 5 Relation with others 7 Enjoyment of life 9 What TIME of day is your pain at its worst? daytime Sleep (in general) Fair  Pain is worse with: walking, bending, sitting and standing Pain improves with: medication Relief from Meds: 3  Mobility walk without assistance use a walker ability to climb steps?  no do you drive?  no  Function disabled: date disabled . I need assistance with the following:  bathing, meal prep, household duties and shopping  Neuro/Psych trouble walking spasms confusion depression anxiety  Prior Studies Any changes since last visit?  no CLINICAL DATA:  57 y/o F; right-sided weakness, numbness, and tingling for 2 days as well as aphasia. History of CVA with right-sided deficits.  EXAM: MRI HEAD WITHOUT CONTRAST  TECHNIQUE: Multiplanar, multiecho pulse sequences of the brain and surrounding structures were obtained without intravenous contrast.  COMPARISON:  07/13/2018 CT head.  03/02/2017 MRI head.  FINDINGS: Brain: 2 punctate foci of reduced diffusion compatible with acute/early subacute infarction within the left temporal lobe, additional focus within the left occipitotemporal junction, and additional focus within the left medial parietal cortex (series 5, image 51, 52, 66).  Several stable very small chronic  infarctions within the bilateral cerebellar hemispheres, multiple cortical infarctions in left cerebral hemisphere in a watershed distribution, and additional small cortical infarctions within the right frontal and parietal lobes. Chronic microvascular ischemic changes of white matter and volume loss of the brain are stable. 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  Vascular: Chronic left ICA occlusion.  Skull and upper cervical spine: Normal marrow signal.  Sinuses/Orbits: Negative.  Other: None.  IMPRESSION: 1. Four punctate foci of acute/early subacute infarction within left temporal lobe, left occipitotemporal junction, and the left parietal lobe. No associated hemorrhage or mass effect. 2. Multiple stable chronic infarctions, microvascular ischemic changes, and volume loss of the brain.  These results were called by telephone at the time of interpretation on 07/13/2018 at 2:51 pm to Dr. Gareth Morgan , who verbally acknowledged these results. Physicians involved in your care Any changes since last visit?  no   Family History  Problem Relation Age of Onset  . Alcohol abuse Father   . Alcohol abuse Brother   . Breast cancer Maternal Aunt   . Colon cancer Neg Hx   . Esophageal cancer Neg Hx   . Stomach cancer Neg Hx   . Rectal cancer Neg Hx    Social History   Socioeconomic History  . Marital status: Divorced    Spouse name: Not on file  . Number of children: 2  . Years of education: Not on file  . Highest education level: 9th grade  Occupational History  .  Occupation: disabled  Social Needs  . Financial resource strain: Not on file  . Food insecurity:    Worry: Not on file    Inability: Not on file  . Transportation needs:    Medical: Not on  file    Non-medical: Not on file  Tobacco Use  . Smoking status: Current Every Day Smoker    Packs/day: 0.50    Types: Cigarettes  . Smokeless tobacco: Never Used  . Tobacco comment: less  Substance and Sexual Activity  . Alcohol use: Yes    Alcohol/week: 2.0 standard drinks    Types: 2 Cans of beer per week    Comment: occasionally   . Drug use: Yes    Types: Marijuana    Comment: quit 2 months ago 10-20-17  . Sexual activity: Not on file  Lifestyle  . Physical activity:    Days per week: Not on file    Minutes per session: Not on file  . Stress: Not on file  Relationships  . Social connections:    Talks on phone: Not on file    Gets together: Not on file    Attends religious service: Not on file    Active member of club or organization: Not on file    Attends meetings of clubs or organizations: Not on file    Relationship status: Not on file  Other Topics Concern  . Not on file  Social History Narrative   Divorced, lives in a 1 story home, with 2 steps to enter. Drinks 2 cups of coffee, and one Mtn. Dew a day. Prior to becoming disabled by CVA, patient was employed in house keeping.    Past Surgical History:  Procedure Laterality Date  . APPENDECTOMY     Past Medical History:  Diagnosis Date  . Anxiety   . Arthritis   . Chronic back pain   . Depression   . ETOH abuse   . GERD (gastroesophageal reflux disease)   . HTN (hypertension)   . Hyperlipidemia   . Stroke [facility])    June 2018  . Tobacco abuse    BP (!) 148/104   Pulse (!) 117   Ht 5\' 4"  (1.626 m)   Wt 130 lb (59 kg)   SpO2 97%   BMI 22.31 kg/m    Opioid Risk Score:   Fall Risk Score:  `1  Depression screen PHQ 2/9  Depression screen [facility] 2/9 03/10/2018 01/16/2018 08/10/2017 06/10/2017 06/10/2017 04/15/2017 03/15/2017  Decreased Interest 0 0 0 2 0 0 1  Down, Depressed, Hopeless 0 0 1 3 3 3 1   PHQ - 2 Score 0 0 1 5 3 3 2   Altered sleeping - - - 3 3 3  -  Tired, decreased energy - - - 3 3 3  -  Change  in appetite - - - - 0 0 -  Feeling bad or failure about yourself  - - - 3 3 3  -  Trouble concentrating - - - 3 3 3  -  Moving slowly or fidgety/restless - - - 3 3 3  -  Suicidal thoughts - - - - 0 0 -  PHQ-9 Score - - - 20 18 18  -  Difficult doing work/chores - - - - - Not difficult at all -     Review of Systems  Constitutional: Negative.   HENT: Negative.   Eyes: Negative.   Respiratory: Negative.   Cardiovascular: Negative.   Gastrointestinal: Negative.   Endocrine: Negative.   Genitourinary: Negative.  Musculoskeletal: Positive for gait problem and myalgias.  Skin: Negative.   Allergic/Immunologic: Negative.   Hematological: Negative.   Psychiatric/Behavioral: Positive for confusion and dysphoric mood. The patient is nervous/anxious.   All other systems reviewed and are negative.      Objective:   Physical Exam Motor strength is 3- at the right deltoid to minus at the bicep and triceps 0 at the finger flexors and extensors MAS 3 at finger flexors and wrist flexors on the right side. Gait is without evidence of toe drag or knee instability Speech without evidence of dysarthria or aphasia. No evidence of field cut.  Interval no acute distress Mood and affect appropriate      Assessment & Plan:  1.  Right spastic hemiplegia secondary left CVA she has had no significant relief with Myobloc.  She had some knuckle swelling after Botox injection however I do not think this represents allergy nevertheless the patient did not want to try that medication again.  We will work on dosing Pete Myobloc in 6 weeks 500 units right bicep 500 units right brachial radialis 2000 units right FDS, 2000 units right FDP, 2000 units right FCR, 2000 units right FCU, 1000 units lumbricals

## 2018-08-10 NOTE — Patient Instructions (Signed)
Quit smoking. 

## 2018-08-16 ENCOUNTER — Encounter (HOSPITAL_COMMUNITY): Payer: Self-pay | Admitting: Psychiatry

## 2018-08-16 ENCOUNTER — Ambulatory Visit (INDEPENDENT_AMBULATORY_CARE_PROVIDER_SITE_OTHER): Payer: Medicaid Other | Admitting: Psychiatry

## 2018-08-16 VITALS — Ht 64.0 in | Wt 135.0 lb

## 2018-08-16 DIAGNOSIS — F411 Generalized anxiety disorder: Secondary | ICD-10-CM | POA: Diagnosis not present

## 2018-08-16 DIAGNOSIS — F331 Major depressive disorder, recurrent, moderate: Secondary | ICD-10-CM | POA: Diagnosis not present

## 2018-08-16 MED ORDER — HYDROXYZINE HCL 10 MG PO TABS: ORAL_TABLET | ORAL | 1 refills | Status: DC

## 2018-08-16 MED ORDER — VENLAFAXINE HCL ER 150 MG PO CP24: 150.0000 mg | ORAL_CAPSULE | Freq: Every day | ORAL | 1 refills | Status: DC

## 2018-08-16 NOTE — Progress Notes (Signed)
BH MD/PA/NP OP Progress Note  08/16/2018 2:05 PM Kelsey Wade  MRN:  [id number]  Chief Complaint: I am feeling better now.  I had a stroke few weeks ago.  My memory is not good.  HPI: Kelsey Wade came for her follow-up appointment with her husband.  In October she had a stroke with right-sided weakness in her hand.  She had a history of a stroke in the past.  She is not happy because her hydroxyzine was reduced to 10 mg.  She has difficulty sleeping and having racing thoughts.  She is also taking a lot of medication for her chronic health issues.  Patient denies any agitation, hallucination or any crying spells.  She denies any suicidal thoughts or homicidal thought.  She was recommended to do rehabilitation and physical therapy which she has not started yet.  She is not sure when she has appointment to see neurology.  She has chronic memory issues but she noticed since the stroke she is more confused.  She denies any feeling of hopelessness or worthlessness but frustrated because of lack of sleep.  Patient admitted drinking alcohol 1-2 beer a week but denies any intoxication or any withdrawal symptoms.  Her husband is supportive.  Visit Diagnosis:    ICD-10-CM   1. MDD (major depressive disorder), recurrent episode, moderate (HCC) F33.1 venlafaxine XR (EFFEXOR-XR) 150 MG 24 hr capsule  2. GAD (generalized anxiety disorder) F41.1 hydrOXYzine (ATARAX/VISTARIL) 10 MG tablet    venlafaxine XR (EFFEXOR-XR) 150 MG 24 hr capsule    Past Psychiatric History: Reviewed. No history of psychiatric inpatient treatment or any suicidal attempt.  History of irritability, anger, mood swings, depression started to get worse after the stroke in 2018. Tried Cymbalta, Zoloft, Celexa, trazodone, amitriptyline, BuSpar, Wellbutrin with limited effect.We tried Lamictal but she c/o dizziness, pain all over the body and confusion.  Past Medical History:  Past Medical History:  Diagnosis Date  . Anxiety   . Arthritis    . Chronic back pain   . Depression   . ETOH abuse   . GERD (gastroesophageal reflux disease)   . HTN (hypertension)   . Hyperlipidemia   . Stroke [facility])    June 2018  . Tobacco abuse     Past Surgical History:  Procedure Laterality Date  . APPENDECTOMY      Family Psychiatric History: Viewed.  Family History:  Family History  Problem Relation Age of Onset  . Alcohol abuse Father   . Alcohol abuse Brother   . Breast cancer Maternal Aunt   . Colon cancer Neg Hx   . Esophageal cancer Neg Hx   . Stomach cancer Neg Hx   . Rectal cancer Neg Hx     Social History:  Social History   Socioeconomic History  . Marital status: Divorced    Spouse name: Not on file  . Number of children: 2  . Years of education: Not on file  . Highest education level: 9th grade  Occupational History  . Occupation: disabled  Social Needs  . Financial resource strain: Not on file  . Food insecurity:    Worry: Not on file    Inability: Not on file  . Transportation needs:    Medical: Not on file    Non-medical: Not on file  Tobacco Use  . Smoking status: Current Every Day Smoker    Packs/day: 0.50    Types: Cigarettes  . Smokeless tobacco: Never Used  . Tobacco comment: less  Substance and Sexual Activity  .  Alcohol use: Yes    Alcohol/week: 2.0 standard drinks    Types: 2 Cans of beer per week    Comment: occasionally   . Drug use: Yes    Types: Marijuana    Comment: quit 2 months ago 10-20-17  . Sexual activity: Not on file  Lifestyle  . Physical activity:    Days per week: Not on file    Minutes per session: Not on file  . Stress: Not on file  Relationships  . Social connections:    Talks on phone: Not on file    Gets together: Not on file    Attends religious service: Not on file    Active member of club or organization: Not on file    Attends meetings of clubs or organizations: Not on file    Relationship status: Not on file  Other Topics Concern  . Not on file   Social History Narrative   Divorced, lives in a 1 story home, with 2 steps to enter. Drinks 2 cups of coffee, and one Mtn. Dew a day. Prior to becoming disabled by CVA, patient was employed in house keeping.     Allergies: No Known Allergies  Metabolic Disorder Labs: Lab Results  Component Value Date   HGBA1C 5.9 (H) 07/14/2018   MPG 122.63 07/14/2018   MPG 111 03/03/2017   No results found for: PROLACTIN Lab Results  Component Value Date   CHOL 167 07/14/2018   TRIG 113 07/14/2018   HDL 46 07/14/2018   CHOLHDL 3.6 07/14/2018   VLDL 23 07/14/2018   LDLCALC 98 07/14/2018   LDLCALC 79 03/03/2017   Lab Results  Component Value Date   TSH 0.432 07/14/2018    Therapeutic Level Labs: No results found for: LITHIUM No results found for: VALPROATE No components found for:  CBMZ  Current Medications: Current Outpatient Medications  Medication Sig Dispense Refill  . albuterol (PROAIR HFA) 108 (90 Base) MCG/ACT inhaler Inhale 2 puffs into the lungs every 6 (six) hours as needed for wheezing or shortness of breath.    Marland Kitchen amLODipine (NORVASC) 10 MG tablet Take 10 mg by mouth daily.  2  . aspirin 325 MG tablet Take 325 mg by mouth daily.    Marland Kitchen atorvastatin (LIPITOR) 40 MG tablet Take 1 tablet (40 mg total) by mouth daily at 6 PM. 30 tablet 0  . clopidogrel (PLAVIX) 75 MG tablet Take 1 tablet (75 mg total) by mouth daily. 90 tablet 0  . famotidine (PEPCID) 40 MG tablet Take 1 tablet (40 mg total) by mouth at bedtime. (Patient taking differently: Take 40 mg by mouth daily. ) 30 tablet 1  . folic acid (FOLVITE) 1 MG tablet Take 1 tablet (1 mg total) by mouth daily.    Marland Kitchen gabapentin (NEURONTIN) 300 MG capsule Take 300 mg by mouth daily after breakfast.    . levETIRAcetam (KEPPRA) 500 MG tablet Take 1 tablet (500 mg total) by mouth 2 (two) times daily. 60 tablet 0  . Multiple Vitamin (MULTIVITAMIN WITH MINERALS) TABS tablet Take 1 tablet by mouth daily.    . polyethylene glycol (MIRALAX /  GLYCOLAX) packet Take 17 g by mouth daily as needed for moderate constipation or severe constipation. 14 each 0  . thiamine 100 MG tablet Take 1 tablet (100 mg total) by mouth daily. 30 tablet 0  . tiZANidine (ZANAFLEX) 4 MG tablet Take 1 tablet (4 mg total) by mouth 3 (three) times daily. 90 tablet 1  . venlafaxine XR (  EFFEXOR-XR) 150 MG 24 hr capsule Take 1 capsule (150 mg total) by mouth daily with breakfast. 30 capsule 1  . Vitamin D, Ergocalciferol, (DRISDOL) 50000 units CAPS capsule Take 50,000 Units by mouth every Saturday.  3  . hydrOXYzine (ATARAX/VISTARIL) 10 MG tablet Take 10 mg by mouth daily as needed for anxiety.     No current facility-administered medications for this visit.      Musculoskeletal: Strength & Muscle Tone: decreased Gait & Station: unsteady Patient leans: N/A  Psychiatric Specialty Exam: Review of Systems  Skin: Negative.   Neurological: Positive for tremors and focal weakness.  Psychiatric/Behavioral: The patient is nervous/anxious and has insomnia.     Height 5\' 4"  (1.626 m), weight 135 lb (61.2 kg).Body mass index is 23.17 kg/m.  General Appearance: Fairly Groomed  Eye Contact:  Fair  Speech:  Slow  Volume:  Decreased  Mood:  Anxious  Affect:  Congruent  Thought Process:  Descriptions of Associations: Intact  Orientation:  Full (Time, Place, and Person)  Thought Content: Logical   Suicidal Thoughts:  No  Homicidal Thoughts:  No  Memory:  Immediate;   Fair Recent;   Fair Remote;   Fair  Judgement:  Fair  Insight:  Fair  Psychomotor Activity:  Decreased  Concentration:  Concentration: Fair and Attention Span: Fair  Recall:  [employer] of Knowledge: Fair  Language: Fair  Akathisia:  No  Handed:  Right  AIMS (if indicated): not done  Assets:  Desire for Improvement Housing Social Support  ADL's:  Intact  Cognition: Impaired,  Mild  Sleep:  Poor   Screenings: PHQ2-9     [facility]  [facility] from 08/10/2017 in [place] Office Visit from 06/10/2017 in [place] Office Visit from 04/15/2017 in [place]  PHQ-2 Total Score  0  0  1  5  3   PHQ-9 Total Score  -  -  -  20  18       Assessment and Plan: Generalized anxiety disorder.  Major depressive disorder.  I reviewed notes from last hospitalization.  She had residual weakness on her right hand.  She is not happy that her hydroxyzine has been reduced because she is not able to sleep.  We discussed polypharmacy in detail.  Recommended to try hydroxyzine 10 mg and if unable to sleep then she can take the second dose.  Continue Effexor XR 150 mg daily.  Patient is not interested in counseling.  Recommended to call us back if is any question or any concern.  Patient is not interested in physical therapy however I encourage that physical therapy may help her to regain his strength on her right hand.  I will see her again in 3 months with   Kathlee Nations, MD 08/16/2018, 2:05 PM

## 2018-08-22 ENCOUNTER — Encounter: Payer: Self-pay | Admitting: *Deleted

## 2018-09-22 ENCOUNTER — Ambulatory Visit: Payer: Medicaid Other | Admitting: Physical Medicine & Rehabilitation

## 2018-09-22 ENCOUNTER — Encounter: Payer: Medicaid Other | Attending: Physical Medicine & Rehabilitation

## 2018-09-22 ENCOUNTER — Encounter: Payer: Self-pay | Admitting: Physical Medicine & Rehabilitation

## 2018-09-22 VITALS — BP 166/93 | HR 75 | Resp 14 | Ht 64.0 in | Wt 137.0 lb

## 2018-09-22 DIAGNOSIS — G8110 Spastic hemiplegia affecting unspecified side: Secondary | ICD-10-CM

## 2018-09-22 DIAGNOSIS — E785 Hyperlipidemia, unspecified: Secondary | ICD-10-CM | POA: Diagnosis not present

## 2018-09-22 DIAGNOSIS — M1990 Unspecified osteoarthritis, unspecified site: Secondary | ICD-10-CM | POA: Insufficient documentation

## 2018-09-22 DIAGNOSIS — G8111 Spastic hemiplegia affecting right dominant side: Secondary | ICD-10-CM | POA: Diagnosis present

## 2018-09-22 DIAGNOSIS — F17210 Nicotine dependence, cigarettes, uncomplicated: Secondary | ICD-10-CM | POA: Insufficient documentation

## 2018-09-22 DIAGNOSIS — G8929 Other chronic pain: Secondary | ICD-10-CM | POA: Insufficient documentation

## 2018-09-22 DIAGNOSIS — I69851 Hemiplegia and hemiparesis following other cerebrovascular disease affecting right dominant side: Secondary | ICD-10-CM | POA: Diagnosis not present

## 2018-09-22 DIAGNOSIS — I10 Essential (primary) hypertension: Secondary | ICD-10-CM | POA: Insufficient documentation

## 2018-09-22 MED ORDER — BACLOFEN 10 MG PO TABS: 10.0000 mg | ORAL_TABLET | Freq: Three times a day (TID) | ORAL | 1 refills | Status: DC

## 2018-09-22 NOTE — Progress Notes (Signed)
Botox Injection for spasticity using needle EMG guidance  Dilution: 5000 Units/ml Indication: Severe spasticity which interferes with ADL,mobility and/or  hygiene and is unresponsive to medication management and other conservative care Informed consent was obtained after describing risks and benefits of the procedure with the patient. This includes bleeding, bruising, infection, excessive weakness, or medication side effects. A REMS form is on file and signed. Needle: 27g 1" needle electrode Number of units per muscle 1000 units right bicep 1000 units right brachial radialis 2000 units right FDS, 2000 units right FDP, 2000 units right FCR, 1250 units right FCU, 750 units lumbricals All injections were done after obtaining appropriate EMG activity and after negative drawback for blood. The patient tolerated the procedure well. Post procedure instructions were given. A followup appointment was made.

## 2018-09-22 NOTE — Patient Instructions (Signed)
myobloc injection 10000 Units

## 2018-09-25 ENCOUNTER — Encounter: Payer: Self-pay | Admitting: Obstetrics & Gynecology

## 2018-10-10 ENCOUNTER — Encounter (HOSPITAL_COMMUNITY): Payer: Self-pay | Admitting: Emergency Medicine

## 2018-10-10 ENCOUNTER — Emergency Department (HOSPITAL_COMMUNITY): Payer: Medicaid Other

## 2018-10-10 ENCOUNTER — Other Ambulatory Visit: Payer: Self-pay

## 2018-10-10 ENCOUNTER — Emergency Department (HOSPITAL_COMMUNITY)
Admission: EM | Admit: 2018-10-10 | Discharge: 2018-10-10 | Disposition: A | Discharge status: Home / Self Care | Payer: Medicaid Other | Attending: Emergency Medicine | Admitting: Emergency Medicine

## 2018-10-10 DIAGNOSIS — I10 Essential (primary) hypertension: Secondary | ICD-10-CM | POA: Insufficient documentation

## 2018-10-10 DIAGNOSIS — G8111 Spastic hemiplegia affecting right dominant side: Secondary | ICD-10-CM | POA: Diagnosis not present

## 2018-10-10 DIAGNOSIS — Z7902 Long term (current) use of antithrombotics/antiplatelets: Secondary | ICD-10-CM | POA: Insufficient documentation

## 2018-10-10 DIAGNOSIS — R531 Weakness: Secondary | ICD-10-CM | POA: Diagnosis not present

## 2018-10-10 DIAGNOSIS — R202 Paresthesia of skin: Secondary | ICD-10-CM | POA: Insufficient documentation

## 2018-10-10 DIAGNOSIS — Z79899 Other long term (current) drug therapy: Secondary | ICD-10-CM | POA: Diagnosis not present

## 2018-10-10 DIAGNOSIS — Z7982 Long term (current) use of aspirin: Secondary | ICD-10-CM | POA: Diagnosis not present

## 2018-10-10 DIAGNOSIS — G8191 Hemiplegia, unspecified affecting right dominant side: Secondary | ICD-10-CM

## 2018-10-10 DIAGNOSIS — F17210 Nicotine dependence, cigarettes, uncomplicated: Secondary | ICD-10-CM | POA: Insufficient documentation

## 2018-10-10 DIAGNOSIS — R471 Dysarthria and anarthria: Secondary | ICD-10-CM | POA: Insufficient documentation

## 2018-10-10 DIAGNOSIS — R413 Other amnesia: Secondary | ICD-10-CM | POA: Diagnosis not present

## 2018-10-10 LAB — RAPID URINE DRUG SCREEN, HOSP PERFORMED
Amphetamines: NOT DETECTED
Barbiturates: NOT DETECTED
Benzodiazepines: NOT DETECTED
Cocaine: NOT DETECTED
Opiates: NOT DETECTED
Tetrahydrocannabinol: NOT DETECTED

## 2018-10-10 LAB — COMPREHENSIVE METABOLIC PANEL
ALT: 14 U/L (ref 0–44)
AST: 21 U/L (ref 15–41)
Albumin: 4 g/dL (ref 3.5–5.0)
Alkaline Phosphatase: 84 U/L (ref 38–126)
Anion gap: 10 (ref 5–15)
BUN: 25 mg/dL — ABNORMAL HIGH (ref 6–20)
CO2: 22 mmol/L (ref 22–32)
Calcium: 9.3 mg/dL (ref 8.9–10.3)
Chloride: 108 mmol/L (ref 98–111)
Creatinine, Ser: 0.8 mg/dL (ref 0.44–1.00)
GFR calc Af Amer: >60 mL/min (ref >60)
GFR calc non Af Amer: >60 mL/min (ref >60)
Glucose, Bld: 129 mg/dL — ABNORMAL HIGH (ref 70–99)
Potassium: 3.4 mmol/L — ABNORMAL LOW (ref 3.5–5.1)
Sodium: 140 mmol/L (ref 135–145)
Total Bilirubin: 0.6 mg/dL (ref 0.3–1.2)
Total Protein: 7.3 g/dL (ref 6.5–8.1)

## 2018-10-10 LAB — DIFFERENTIAL
Abs Immature Granulocytes: 0.02 10*3/uL (ref 0.00–0.07)
Basophils Absolute: 0.1 10*3/uL (ref 0.0–0.1)
Basophils Relative: 1 %
Eosinophils Absolute: 0.2 10*3/uL (ref 0.0–0.5)
Eosinophils Relative: 2 %
Immature Granulocytes: 0 %
Lymphocytes Relative: 33 %
Lymphs Abs: 2.3 10*3/uL (ref 0.7–4.0)
Monocytes Absolute: 0.5 10*3/uL (ref 0.1–1.0)
Monocytes Relative: 7 %
Neutro Abs: 4.1 10*3/uL (ref 1.7–7.7)
Neutrophils Relative %: 57 %

## 2018-10-10 LAB — CBC
HCT: 45.4 % (ref 36.0–46.0)
Hemoglobin: 14.2 g/dL (ref 12.0–15.0)
MCH: 28.1 pg (ref 26.0–34.0)
MCHC: 31.3 g/dL (ref 30.0–36.0)
MCV: 89.7 fL (ref 80.0–100.0)
Platelets: 310 10*3/uL (ref 150–400)
RBC: 5.06 MIL/uL (ref 3.87–5.11)
RDW: 14 % (ref 11.5–15.5)
WBC: 7 10*3/uL (ref 4.0–10.5)
nRBC: 0 % (ref 0.0–0.2)

## 2018-10-10 LAB — PROTIME-INR
INR: 0.99
Prothrombin Time: 13 seconds (ref 11.4–15.2)

## 2018-10-10 LAB — I-STAT BETA HCG BLOOD, ED (MC, WL, AP ONLY): I-stat hCG, quantitative: <5 m[IU]/mL (ref <5)

## 2018-10-10 LAB — ETHANOL: Alcohol, Ethyl (B): <10 mg/dL (ref <10)

## 2018-10-10 LAB — I-STAT TROPONIN, ED: Troponin i, poc: 0 ng/mL (ref 0.00–0.08)

## 2018-10-10 LAB — CBG MONITORING, ED: Glucose-Capillary: 121 mg/dL — ABNORMAL HIGH (ref 70–99)

## 2018-10-10 LAB — APTT: aPTT: 34 seconds (ref 24–36)

## 2018-10-10 LAB — I-STAT CREATININE, ED: Creatinine, Ser: 0.7 mg/dL (ref 0.44–1.00)

## 2018-10-10 MED ORDER — OXYCODONE HCL 5 MG PO TABS
5.0000 mg | ORAL_TABLET | Freq: Once | ORAL | Status: AC
  Administered 2018-10-10: 5 mg via ORAL
  Filled 2018-10-10: qty 1

## 2018-10-10 MED ORDER — SODIUM CHLORIDE 0.9% FLUSH: 3.0000 mL | Freq: Once | INTRAVENOUS | Status: DC

## 2018-10-10 NOTE — Discharge Instructions (Signed)
You were seen at the emergency department for some difficulty with speaking.  You had a CAT scan and an MRI that did not show an obvious explanation for her symptoms.  Please follow-up with your neurologist as an outpatient.  Return if any concerns.

## 2018-10-10 NOTE — Consult Note (Addendum)
Referring Physician: Dr. Melina Copa    Reason for Consult: Code Stroke  HPI: Kelsey Wade is an 58 y.o. female with HTN, HLD, chronic back pain dependant on opioids, ETOH abuse, depression, previous stroke and residual right side weakness (mRS2), . She was brought to the ER in POV for sudden onset of slurred speech around 1100 per pt. However, when husband arrived to the ED he related to [place] that she woke up with slurred speech and last was at her baseline at about 8pm last night.   Exam reveals an NIHSS of 4 for chronic findings on her right side referable to old stroke; mild dysarthria (no aphasia), difficult to determine if there are new stroke findings. CTH shows multiple old strokes bilaterally, but no new infarcts. No LVO. Pt appears to have dried blood on her lips/tongue, but upon inspection do not see tongue/lip laceration. No reported seizure activity, only speech changes. Will ck UDS and ETOH levels.  MRI revealed no new findings: 1. No acute intracranial abnormality. 2. Multiple chronic infarcts and chronic small vessel ischemic white matter disease as above.  Date last known well: last night per husband Time last known well: 63- self reported  tPA Given: Not given d/t diagnostic uncertainty; time LKW   Past Medical History Past Medical History:  Diagnosis Date  . Anxiety   . Arthritis   . Chronic back pain   . Depression   . ETOH abuse   . GERD (gastroesophageal reflux disease)   . HTN (hypertension)   . Hyperlipidemia   . Stroke [facility])    June 2018  . Tobacco abuse     Surgical History Past Surgical History:  Procedure Laterality Date  . APPENDECTOMY      Family History  Family History  Problem Relation Age of Onset  . Alcohol abuse Father   . Alcohol abuse Brother   . Breast cancer Maternal Aunt   . Colon cancer Neg Hx   . Esophageal cancer Neg Hx   . Stomach cancer Neg Hx   . Rectal cancer Neg Hx     Social History:   reports that she has been  smoking cigarettes. She has been smoking about 0.50 packs per day. She has never used smokeless tobacco. She reports current alcohol use of about 2.0 standard drinks of alcohol per week. She reports current drug use. Drug: Marijuana.  Allergies:  No Known Allergies  Home Medications:  (Not in a hospital admission)   Hospital Medications . sodium chloride flush  3 mL Intravenous Once    ROS: History obtained from patient. As per HPI with 10 point review of systems otherwise negative.    Physical Examination:  There were no vitals filed for this visit.  General - mod distress, appears older than stated age 22: Poor dentition Heart - Regular rate and rhythm - no murmer appreciated Lungs - Clear to auscultation  Abdomen - Soft - non tender Extremities - Distal pulses intact - no edema Skin - Warm and dry   Neurologic Examination:  Mental Status: Alert, oriented, occasionally gets date wrong when repetitively asked. Thought content appropriate.  Speech is slightly dysarthric, but without evidence of aphasia. Able to follow 3 step commands without difficulty. Cranial Nerves: II: Discs not visualized; Visual fields intact, PERRL.  III,IV, VI: ptosis not present, extra-ocular motions are full with fast beating nystagmus in the direction of gaze during left gaze, right gaze and upward gaze V,VII: Mild right facial droop. facial light touch sensation normal  bilaterally VIII: hearing intact to voice IX,X: Palate elevates normally XI: Symmetric shoulder shrug XII: midline tongue extension Motor: RUE - 3/5    LUE - 5/5   RLE - 4+5    LLE - 5/5 Tone and bulk:Tone is increased on right leg; right arm with increased tone/spacticity.  Sensory: Light touch intact throughout left side, decreased in right arm Deep Tendon Reflexes: 2+ on left. Hyporeflexic on the right Plantars: Right: upgoing   Left: downgoing Cerebellar: Unable to complete on the right given degree of baseline weakness  in right side. Normal finger-to-nose on left, but left leg is limited d/t pain.  Gait: Deferred at this time.   NIHSS 1a Level of Conscious:0 1b LOC Questions: 0 1c LOC Commands: 0 2 Best Gaze: 0 3 Visual: 0 4 Facial Palsy: 1 5a Motor Arm - left: 0 5b Motor Arm - Right: 1 6a Motor Leg - Left: 0 6b Motor Leg - Right: 0 7 Limb Ataxia: 0 8 Sensory: 1 9 Best Language: 0 10 Dysarthria:1 11 Extinct. and Inattention:0 TOTAL: 4   LABORATORY STUDIES:  Basic Metabolic Panel: Recent Labs  Lab 10/10/18 1223  CREATININE 0.70    Liver Function Tests: No results for input(s): AST, ALT, ALKPHOS, BILITOT, PROT, ALBUMIN in the last 168 hours. No results for input(s): LIPASE, AMYLASE in the last 168 hours. No results for input(s): AMMONIA in the last 168 hours.  CBC: Recent Labs  Lab 10/10/18 1213  WBC 7.0  NEUTROABS 4.1  HGB 14.2  HCT 45.4  MCV 89.7  PLT 310    Cardiac Enzymes: No results for input(s): CKTOTAL, CKMB, CKMBINDEX, TROPONINI in the last 168 hours.  BNP: Invalid input(s): POCBNP  CBG: No results for input(s): GLUCAP in the last 168 hours.  Microbiology:   Coagulation Studies: Recent Labs    10/10/18 1213  LABPROT 13.0  INR 0.99    Urinalysis: No results for input(s): COLORURINE, LABSPEC, PHURINE, GLUCOSEU, HGBUR, BILIRUBINUR, KETONESUR, PROTEINUR, UROBILINOGEN, NITRITE, LEUKOCYTESUR in the last 168 hours.  Invalid input(s): APPERANCEUR  Lipid Panel:     Component Value Date/Time   CHOL 167 07/14/2018 0658   TRIG 113 07/14/2018 0658   HDL 46 07/14/2018 0658   CHOLHDL 3.6 07/14/2018 0658   VLDL 23 07/14/2018 0658   LDLCALC 98 07/14/2018 0658    HgbA1C:  Lab Results  Component Value Date   HGBA1C 5.9 (H) 07/14/2018    Urine Drug Screen:      Component Value Date/Time   LABOPIA POSITIVE (A) 07/13/2018 1007   COCAINSCRNUR NONE DETECTED 07/13/2018 1007   LABBENZ NONE DETECTED 07/13/2018 1007   AMPHETMU NONE DETECTED 07/13/2018 1007    THCU NONE DETECTED 07/13/2018 1007   LABBARB NONE DETECTED 07/13/2018 1007     Alcohol Level:  No results for input(s): ETH in the last 168 hours.  Miscellaneous labs:  EKG  EKG   IMAGING: Ct Head Code Stroke Wo Contrast  Result Date: 10/10/2018 CLINICAL DATA:  Code stroke. 58 year old female with history of chronically occluded left ICA, left hemisphere infarcts. Slurred speech. Last seen normal 1100 hours. EXAM: CT HEAD WITHOUT CONTRAST TECHNIQUE: Contiguous axial images were obtained from the base of the skull through the vertex without intravenous contrast. COMPARISON:  CT head, MR brain 07/13/2018 and CTA head and neck 07/14/2018. FINDINGS: Brain: Chronic cortical and watershed encephalomalacia in the left hemisphere appears stable by CT since October. Two small areas of cortical and subcortical white matter encephalomalacia in the superior right hemisphere also  appears stable. Small chronic bilateral cerebellar infarcts are also redemonstrated. No cortically based acute infarct identified. No acute intracranial hemorrhage identified. No intracranial mass effect. No ventriculomegaly. Vascular: Calcified atherosclerosis at the skull base. No suspicious intracranial vascular hyperdensity. Skull: Negative. Sinuses/Orbits: Visualized paranasal sinuses and mastoids are stable and well pneumatized. Other: Negative orbits. Stable scalp soft tissues. ASPECTS [facility] Stroke Program Early CT Score) Total score (0-10 with 10 being normal): 10, allowing for chronic encephalomalacia in both hemispheres. IMPRESSION: 1. Chronic left greater than right cerebral and bilateral cerebellar infarcts appears stable by CT since October. 2. No acute intracranial hemorrhage or cortically based infarct identified. 3. These results were communicated to Dr. Cheral Marker at 12:30 pmon 1/21/2020by text page via the [facility] messaging system. Electronically Signed   By: Genevie Ann M.D.   On: 10/10/2018 12:31      Assessment:  58 y.o. female who previously had a medium to large sized left MCA stroke with residual right side deficits who presents today with slurred speech and increased right sided weakness.  1. Exam does not reveal any definite new deficit. 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  2. Stroke Risk Factors - prev strokes, HTN, HLD, tobacco smoker, ETOH abuse 3. MRI brain reveals no new strokes. Chronic strokes are noted.  # Acute speech deficit- DDx: Seizure/post-ictal state, ETOH intoxication, medication effect, toxic-metabolic encephalopathy, decompensation of old stroke symptoms for currently unknown reasons such as infection etc. However, unmasking of pre-existing deficit by volume depletion, infection or pain is felt to be more likely # ETOH abuse- ETOH level orderd, UAD orderd # Chronic pain- opioid dependence # h/o strokes- residual debility with Right side weakness, arm numbness # HTN- permissive for now till better defined dx # HLD- LDL goal is >70 given prev strokes/cont to smoke. Resume home statin when able # Tobacco smoker- need counseling. Patch if desired   Recommendations:  UDS, ETOH level  Risk factor modification  Telemetry monitoring  Frequent neuro checks  Fall Precautions  No further stroke work up indicated given no new findings on MRI while patient was still symptomatic  Pain management. Patient expresses insistence on being treated with 15 mg oxycodone  Desiree Metzger-Cihelka, ARNP-C, ANVP-BC  I have seen and examined the patient. I have formulated the assessment and recommendations.  Electronically signed: Dr. Kerney Elbe

## 2018-10-10 NOTE — ED Notes (Signed)
Pt transported to MRI 

## 2018-10-10 NOTE — ED Provider Notes (Signed)
[facility] EMERGENCY DEPARTMENT Provider Note   CSN: [id number] Arrival date & time: 10/10/18  1205     History   Chief Complaint No chief complaint on file.   HPI Kelsey Wade is a 58 y.o. female.  HPI   58 year old c/o "I'm having stroke symptoms." Past  history of previous CVA with right-sided spastic hemiplegia ,hyperlipidemia, chronic left-sided carotid artery occlusion, essential hypertension. Also recent admit with RUE weakness and jerking. SubsequentMRI of the brain showed acute/subacute left temporal lobe, left occipitotemporal junction and left temporal lobe infarct.  Today she says she began having symptoms at 11am. I have difficulty following her history. She said her symptoms began when she was getting ready to come to the hospital but the reason she was coming to the hospital was her "stroke-like symptoms." She says she is having slurred speech and memory problems.   Past Medical History:  Diagnosis Date  . Anxiety   . Arthritis   . Chronic back pain   . Depression   . ETOH abuse   . GERD (gastroesophageal reflux disease)   . HTN (hypertension)   . Hyperlipidemia   . Stroke [facility])    June 2018  . Tobacco abuse     Patient Active Problem List   Diagnosis Date Noted  . CVA (cerebral vascular accident) ([place]) 07/13/2018  . AKI (acute kidney injury) ([place]) 01/04/2018  . Toxic metabolic encephalopathy 62/95/2841  . Alcohol withdrawal ([place]) 01/04/2018  . Spastic hemiplegia affecting dominant side ([place]) 10/25/2017  . Left carotid artery occlusion   . CVA (cerebrovascular accident) ([place]) 03/02/2017  . Chronic pain 03/02/2017  . ETOH abuse 03/02/2017  . Ischemic stroke ([place])   . Hyperlipidemia LDL goal <100 11/15/2016  . Essential hypertension 11/12/2016  . Neuropathy 11/12/2016  . Tobacco dependence 11/12/2016    Past Surgical History:  Procedure Laterality Date  . APPENDECTOMY       OB History   No obstetric history on file.      Home Medications    Prior to Admission medications   Medication Sig Start Date End Date Taking? Authorizing Provider  albuterol (PROAIR HFA) 108 (90 Base) MCG/ACT inhaler Inhale 2 puffs into the lungs every 6 (six) hours as needed for wheezing or shortness of breath.    [provider]  amLODipine (NORVASC) 10 MG tablet Take 10 mg by mouth daily. 01/27/18   [provider]  aspirin 325 MG tablet Take 325 mg by mouth daily.    [provider]  atorvastatin (LIPITOR) 40 MG tablet Take 1 tablet (40 mg total) by mouth daily at 6 PM. 07/16/18 08/16/18  Amin, Jeanella Flattery, MD  baclofen (LIORESAL) 10 MG tablet Take 1 tablet (10 mg total) by mouth 3 (three) times daily. 09/22/18   Kirsteins, Luanna Salk, MD  clopidogrel (PLAVIX) 75 MG tablet Take 1 tablet (75 mg total) by mouth daily. 07/17/18 10/15/18  Amin, Jeanella Flattery, MD  famotidine (PEPCID) 40 MG tablet Take 1 tablet (40 mg total) by mouth at bedtime. Patient taking differently: Take 40 mg by mouth daily.  10/03/17 10/03/18  Dorena Dew, FNP  folic acid (FOLVITE) 1 MG tablet Take 1 tablet (1 mg total) by mouth daily. 01/07/18   Samuella Cota, MD  gabapentin (NEURONTIN) 300 MG capsule Take 300 mg by mouth daily after breakfast.    [provider]  hydrOXYzine (ATARAX/VISTARIL) 10 MG tablet Take 1-2 tab as needed for anxiety and sleep 08/16/18   Arfeen, Dossie Der  T, MD  levETIRAcetam (KEPPRA) 500 MG tablet Take 1 tablet (500 mg total) by mouth 2 (two) times daily. 07/16/18 08/16/18  Damita Lack, MD  Multiple Vitamin (MULTIVITAMIN WITH MINERALS) TABS tablet Take 1 tablet by mouth daily. 01/07/18   Samuella Cota, MD  polyethylene glycol [facility] Floria Raveling) packet Take 17 g by mouth daily as needed for moderate constipation or severe constipation. 07/16/18   Damita Lack, MD  venlafaxine XR (EFFEXOR-XR) 150 MG 24 hr capsule Take 1 capsule (150 mg total) by mouth daily with breakfast. 08/16/18  08/16/19  Arfeen, Arlyce Harman, MD  Vitamin D, Ergocalciferol, (DRISDOL) 50000 units CAPS capsule Take 50,000 Units by mouth every Saturday. 04/17/18   [provider]    Family History Family History  Problem Relation Age of Onset  . Alcohol abuse Father   . Alcohol abuse Brother   . Breast cancer Maternal Aunt   . Colon cancer Neg Hx   . Esophageal cancer Neg Hx   . Stomach cancer Neg Hx   . Rectal cancer Neg Hx     Social History Social History   Tobacco Use  . Smoking status: Current Every Day Smoker    Packs/day: 0.50    Types: Cigarettes  . Smokeless tobacco: Never Used  . Tobacco comment: less  Substance Use Topics  . Alcohol use: Yes    Alcohol/week: 2.0 standard drinks    Types: 2 Cans of beer per week    Comment: occasionally   . Drug use: Yes    Types: Marijuana    Comment: quit 2 months ago 10-20-17     Allergies   Patient has no known allergies.   Review of Systems Review of Systems All systems reviewed and negative, other than as noted in HPI.   Physical Exam Updated Vital Signs There were no vitals taken for this visit.  Physical Exam Vitals signs and nursing note reviewed.  Constitutional:      General: She is not in acute distress.    Appearance: She is well-developed.  HENT:     Head: Normocephalic and atraumatic.  Eyes:     General:        Right eye: No discharge.        Left eye: No discharge.     Conjunctiva/sclera: Conjunctivae normal.  Neck:     Musculoskeletal: Neck supple.  Cardiovascular:     Rate and Rhythm: Normal rate and regular rhythm.     Heart sounds: Normal heart sounds. No murmur. No friction rub. No gallop.   Pulmonary:     Effort: Pulmonary effort is normal. No respiratory distress.     Breath sounds: Normal breath sounds.  Abdominal:     General: There is no distension.     Palpations: Abdomen is soft.     Tenderness: There is no abdominal tenderness.  Musculoskeletal:        General: No tenderness.    Skin:    General: Skin is warm and dry.  Neurological:     Mental Status: She is alert and oriented to person, place, and time.     Comments: Mild dysarthria. Cn 2-12 intact. 4/5 strength RUE. Normal other extremities. Sensation intact to light touch.   Psychiatric:        Behavior: Behavior normal.        Thought Content: Thought content normal.      ED Treatments / Results  Labs (all labs ordered are listed, but only abnormal results  are displayed) Labs Reviewed  COMPREHENSIVE METABOLIC PANEL - Abnormal; Notable for the following components:      Result Value   Potassium 3.4 (*)    Glucose, Bld 129 (*)    BUN 25 (*)    All other components within normal limits  CBG MONITORING, ED - Abnormal; Notable for the following components:   Glucose-Capillary 121 (*)    All other components within normal limits  PROTIME-INR  APTT  CBC  DIFFERENTIAL  ETHANOL  RAPID URINE DRUG SCREEN, HOSP PERFORMED  I-STAT TROPONIN, ED  I-STAT BETA HCG BLOOD, ED ([facility], [facility], [facility] ONLY)  I-STAT CREATININE, ED    EKG EKG Interpretation  Date/Time:  Tuesday October 10 2018 12:50:33 EST Ventricular Rate:  86 PR Interval:    QRS Duration: 92 QT Interval:  396 QTC Calculation: 474 R Axis:   89 Text Interpretation:  Sinus rhythm Probable left atrial enlargement Nonspecific T abnrm, anterolateral leads Confirmed by Virgel Manifold [id number]) on 10/10/2018 3:56:29 PM   Radiology Ct Head Code Stroke Wo Contrast  Result Date: 10/10/2018 CLINICAL DATA:  Code stroke. 58 year old female with history of chronically occluded left ICA, left hemisphere infarcts. Slurred speech. Last seen normal 1100 hours. EXAM: CT HEAD WITHOUT CONTRAST TECHNIQUE: Contiguous axial images were obtained from the base of the skull through the vertex without intravenous contrast. COMPARISON:  CT head, MR brain 07/13/2018 and CTA head and neck 07/14/2018. FINDINGS: Brain: Chronic cortical and watershed encephalomalacia in the left  hemisphere appears stable by CT since October. Two small areas of cortical and subcortical white matter encephalomalacia in the superior right hemisphere also appears stable. Small chronic bilateral cerebellar infarcts are also redemonstrated. No cortically based acute infarct identified. No acute intracranial hemorrhage identified. No intracranial mass effect. No ventriculomegaly. Vascular: Calcified atherosclerosis at the skull base. No suspicious intracranial vascular hyperdensity. Skull: Negative. Sinuses/Orbits: Visualized paranasal sinuses and mastoids are stable and well pneumatized. Other: Negative orbits. Stable scalp soft tissues. ASPECTS [facility] Stroke Program Early CT Score) Total score (0-10 with 10 being normal): 10, allowing for chronic encephalomalacia in both hemispheres. IMPRESSION: 1. Chronic left greater than right cerebral and bilateral cerebellar infarcts appears stable by CT since October. 2. No acute intracranial hemorrhage or cortically based infarct identified. 3. These results were communicated to Dr. Cheral Marker at 12:30 pmon 1/21/2020by text page via the [facility] messaging system. Electronically Signed   By: Genevie Ann M.D.   On: 10/10/2018 12:31    Procedures Procedures (including critical care time)  Medications Ordered in ED Medications  sodium chloride flush (NS) 0.9 % injection 3 mL (has no administration in time range)     Initial Impression / Assessment and Plan / ED Course  I have reviewed the triage vital signs and the nursing notes.  Pertinent labs & imaging results that were available during my care of the patient were reviewed by me and considered in my medical decision making (see chart for details).     57yF with questionable stroke like symptoms. Possibly mild dysarthria? Chronic deficits otherwise. Evaluated by neurology. CT neg. Plan MRI. DC home if w/o acute findings.   Final Clinical Impressions(s) / ED Diagnoses   Final diagnoses:  Dysarthria    ED  Discharge Orders    None       Virgel Manifold, MD 10/16/18 1012

## 2018-10-10 NOTE — ED Provider Notes (Signed)
Patient signed out to me from Dr. Wilson Singer.  58 year old female here with some word finding difficulties.  She is negative CT of her head and was pending an MRI dispel per MRI.  MRI is resulted and does not show any acute findings.  I reviewed this with the patient and she says she is a patient of [facility] and I recommended that she follow back up with them.  She says her speaking difficulties have improved here.   Hayden Rasmussen, MD 10/10/18 [phone]

## 2018-10-10 NOTE — Code Documentation (Signed)
57 yo female coming from home with husband who reports that patient started to "not act normal" around 1000 this morning. Pt reports getting dressed this morning and started to notice some trouble getting her words out and slurred speech. Pt has hx of stroke one year ago with baseline right arm weakness, right arm decreased sensation, and right leg weakness. Pt utilizes a cane some at home to walk. Reports the slurred speech to be new today. No aphasia noted. Code Stroke called when patient arrived in the ED POV. CT Head negative for hemorrhage. Baseline NIHSS 3, current NIHSS 4. Pt is too mild to treat at this time. Remains in the tPA window until 1430. Patient to get MRI. Handoff given to [place], Therapist, sports.

## 2018-11-03 ENCOUNTER — Encounter: Payer: Medicaid Other | Attending: Physical Medicine & Rehabilitation

## 2018-11-03 ENCOUNTER — Ambulatory Visit: Payer: Self-pay | Admitting: Physical Medicine & Rehabilitation

## 2018-11-03 DIAGNOSIS — E785 Hyperlipidemia, unspecified: Secondary | ICD-10-CM | POA: Insufficient documentation

## 2018-11-03 DIAGNOSIS — M1990 Unspecified osteoarthritis, unspecified site: Secondary | ICD-10-CM | POA: Insufficient documentation

## 2018-11-03 DIAGNOSIS — I10 Essential (primary) hypertension: Secondary | ICD-10-CM | POA: Insufficient documentation

## 2018-11-03 DIAGNOSIS — F17210 Nicotine dependence, cigarettes, uncomplicated: Secondary | ICD-10-CM | POA: Insufficient documentation

## 2018-11-03 DIAGNOSIS — I69851 Hemiplegia and hemiparesis following other cerebrovascular disease affecting right dominant side: Secondary | ICD-10-CM | POA: Insufficient documentation

## 2018-11-03 DIAGNOSIS — G8929 Other chronic pain: Secondary | ICD-10-CM | POA: Insufficient documentation

## 2018-11-06 ENCOUNTER — Ambulatory Visit (HOSPITAL_COMMUNITY): Payer: Self-pay | Admitting: Psychiatry

## 2019-01-02 ENCOUNTER — Emergency Department (HOSPITAL_COMMUNITY): Payer: Medicaid Other

## 2019-01-02 ENCOUNTER — Encounter (HOSPITAL_COMMUNITY): Payer: Self-pay | Admitting: Emergency Medicine

## 2019-01-02 ENCOUNTER — Inpatient Hospital Stay (HOSPITAL_COMMUNITY)
Admission: EM | Admit: 2019-01-02 | Discharge: 2019-01-08 | DRG: 070 | Disposition: A | Discharge status: Home Health | Payer: Medicaid Other | Attending: Family Medicine | Admitting: Family Medicine

## 2019-01-02 DIAGNOSIS — M549 Dorsalgia, unspecified: Secondary | ICD-10-CM | POA: Diagnosis present

## 2019-01-02 DIAGNOSIS — W19XXXA Unspecified fall, initial encounter: Secondary | ICD-10-CM | POA: Diagnosis present

## 2019-01-02 DIAGNOSIS — Z20828 Contact with and (suspected) exposure to other viral communicable diseases: Secondary | ICD-10-CM | POA: Diagnosis present

## 2019-01-02 DIAGNOSIS — F1410 Cocaine abuse, uncomplicated: Secondary | ICD-10-CM | POA: Diagnosis present

## 2019-01-02 DIAGNOSIS — G936 Cerebral edema: Secondary | ICD-10-CM | POA: Diagnosis not present

## 2019-01-02 DIAGNOSIS — R7989 Other specified abnormal findings of blood chemistry: Secondary | ICD-10-CM | POA: Diagnosis not present

## 2019-01-02 DIAGNOSIS — E872 Acidosis: Secondary | ICD-10-CM | POA: Diagnosis present

## 2019-01-02 DIAGNOSIS — S0003XA Contusion of scalp, initial encounter: Secondary | ICD-10-CM | POA: Diagnosis present

## 2019-01-02 DIAGNOSIS — F17210 Nicotine dependence, cigarettes, uncomplicated: Secondary | ICD-10-CM | POA: Diagnosis present

## 2019-01-02 DIAGNOSIS — I69351 Hemiplegia and hemiparesis following cerebral infarction affecting right dominant side: Secondary | ICD-10-CM

## 2019-01-02 DIAGNOSIS — R000 Tachycardia, unspecified: Secondary | ICD-10-CM | POA: Diagnosis present

## 2019-01-02 DIAGNOSIS — I6783 Posterior reversible encephalopathy syndrome: Principal | ICD-10-CM | POA: Diagnosis present

## 2019-01-02 DIAGNOSIS — G8194 Hemiplegia, unspecified affecting left nondominant side: Secondary | ICD-10-CM | POA: Diagnosis present

## 2019-01-02 DIAGNOSIS — F419 Anxiety disorder, unspecified: Secondary | ICD-10-CM | POA: Diagnosis present

## 2019-01-02 DIAGNOSIS — R9401 Abnormal electroencephalogram [EEG]: Secondary | ICD-10-CM | POA: Diagnosis present

## 2019-01-02 DIAGNOSIS — F411 Generalized anxiety disorder: Secondary | ICD-10-CM

## 2019-01-02 DIAGNOSIS — I639 Cerebral infarction, unspecified: Secondary | ICD-10-CM

## 2019-01-02 DIAGNOSIS — J9601 Acute respiratory failure with hypoxia: Secondary | ICD-10-CM | POA: Diagnosis not present

## 2019-01-02 DIAGNOSIS — F331 Major depressive disorder, recurrent, moderate: Secondary | ICD-10-CM

## 2019-01-02 DIAGNOSIS — R569 Unspecified convulsions: Secondary | ICD-10-CM | POA: Diagnosis present

## 2019-01-02 DIAGNOSIS — R5381 Other malaise: Secondary | ICD-10-CM | POA: Diagnosis present

## 2019-01-02 DIAGNOSIS — G8929 Other chronic pain: Secondary | ICD-10-CM | POA: Diagnosis present

## 2019-01-02 DIAGNOSIS — F1010 Alcohol abuse, uncomplicated: Secondary | ICD-10-CM | POA: Diagnosis present

## 2019-01-02 DIAGNOSIS — I248 Other forms of acute ischemic heart disease: Secondary | ICD-10-CM | POA: Diagnosis present

## 2019-01-02 DIAGNOSIS — K219 Gastro-esophageal reflux disease without esophagitis: Secondary | ICD-10-CM | POA: Diagnosis present

## 2019-01-02 DIAGNOSIS — M1990 Unspecified osteoarthritis, unspecified site: Secondary | ICD-10-CM | POA: Diagnosis present

## 2019-01-02 DIAGNOSIS — Z7982 Long term (current) use of aspirin: Secondary | ICD-10-CM

## 2019-01-02 DIAGNOSIS — N179 Acute kidney failure, unspecified: Secondary | ICD-10-CM

## 2019-01-02 DIAGNOSIS — E785 Hyperlipidemia, unspecified: Secondary | ICD-10-CM | POA: Diagnosis present

## 2019-01-02 DIAGNOSIS — J189 Pneumonia, unspecified organism: Secondary | ICD-10-CM | POA: Diagnosis present

## 2019-01-02 DIAGNOSIS — G9340 Encephalopathy, unspecified: Secondary | ICD-10-CM

## 2019-01-02 DIAGNOSIS — G9389 Other specified disorders of brain: Secondary | ICD-10-CM | POA: Diagnosis present

## 2019-01-02 DIAGNOSIS — Z79899 Other long term (current) drug therapy: Secondary | ICD-10-CM

## 2019-01-02 DIAGNOSIS — R778 Other specified abnormalities of plasma proteins: Secondary | ICD-10-CM | POA: Diagnosis present

## 2019-01-02 DIAGNOSIS — Z811 Family history of alcohol abuse and dependence: Secondary | ICD-10-CM

## 2019-01-02 DIAGNOSIS — I10 Essential (primary) hypertension: Secondary | ICD-10-CM | POA: Diagnosis present

## 2019-01-02 DIAGNOSIS — R4701 Aphasia: Secondary | ICD-10-CM | POA: Diagnosis present

## 2019-01-02 DIAGNOSIS — H518 Other specified disorders of binocular movement: Secondary | ICD-10-CM | POA: Diagnosis present

## 2019-01-02 DIAGNOSIS — F329 Major depressive disorder, single episode, unspecified: Secondary | ICD-10-CM | POA: Diagnosis present

## 2019-01-02 LAB — POCT I-STAT 7, (LYTES, BLD GAS, ICA,H+H)
Acid-base deficit: 5 mmol/L — ABNORMAL HIGH (ref 0.0–2.0)
Bicarbonate: 19.6 mmol/L — ABNORMAL LOW (ref 20.0–28.0)
Calcium, Ion: 1.17 mmol/L (ref 1.15–1.40)
HCT: 47 % — ABNORMAL HIGH (ref 36.0–46.0)
Hemoglobin: 16 g/dL — ABNORMAL HIGH (ref 12.0–15.0)
O2 Saturation: 100 %
Patient temperature: 102.1
Potassium: 4.2 mmol/L (ref 3.5–5.1)
Sodium: 141 mmol/L (ref 135–145)
TCO2: 21 mmol/L — ABNORMAL LOW (ref 22–32)
pCO2 arterial: 37 mmHg (ref 32.0–48.0)
pH, Arterial: 7.34 — ABNORMAL LOW (ref 7.350–7.450)
pO2, Arterial: 333 mmHg — ABNORMAL HIGH (ref 83.0–108.0)

## 2019-01-02 LAB — COMPREHENSIVE METABOLIC PANEL
ALT: 18 U/L (ref 0–44)
AST: 44 U/L — ABNORMAL HIGH (ref 15–41)
Albumin: 3.8 g/dL (ref 3.5–5.0)
Alkaline Phosphatase: 111 U/L (ref 38–126)
Anion gap: 21 — ABNORMAL HIGH (ref 5–15)
BUN: 29 mg/dL — ABNORMAL HIGH (ref 6–20)
CO2: 13 mmol/L — ABNORMAL LOW (ref 22–32)
Calcium: 9.8 mg/dL (ref 8.9–10.3)
Chloride: 106 mmol/L (ref 98–111)
Creatinine, Ser: 1.34 mg/dL — ABNORMAL HIGH (ref 0.44–1.00)
GFR calc Af Amer: 51 mL/min — ABNORMAL LOW (ref >60)
GFR calc non Af Amer: 44 mL/min — ABNORMAL LOW (ref >60)
Glucose, Bld: 182 mg/dL — ABNORMAL HIGH (ref 70–99)
Potassium: 4.4 mmol/L (ref 3.5–5.1)
Sodium: 140 mmol/L (ref 135–145)
Total Bilirubin: 0.5 mg/dL (ref 0.3–1.2)
Total Protein: 8 g/dL (ref 6.5–8.1)

## 2019-01-02 LAB — CBC WITH DIFFERENTIAL/PLATELET
Abs Immature Granulocytes: 0.07 10*3/uL (ref 0.00–0.07)
Basophils Absolute: 0 10*3/uL (ref 0.0–0.1)
Basophils Relative: 0 %
Eosinophils Absolute: 0 10*3/uL (ref 0.0–0.5)
Eosinophils Relative: 0 %
HCT: 54.7 % — ABNORMAL HIGH (ref 36.0–46.0)
Hemoglobin: 16.7 g/dL — ABNORMAL HIGH (ref 12.0–15.0)
Immature Granulocytes: 1 %
Lymphocytes Relative: 8 %
Lymphs Abs: 1.2 10*3/uL (ref 0.7–4.0)
MCH: 27.3 pg (ref 26.0–34.0)
MCHC: 30.5 g/dL (ref 30.0–36.0)
MCV: 89.4 fL (ref 80.0–100.0)
Monocytes Absolute: 1 10*3/uL (ref 0.1–1.0)
Monocytes Relative: 7 %
Neutro Abs: 12.9 10*3/uL — ABNORMAL HIGH (ref 1.7–7.7)
Neutrophils Relative %: 84 %
Platelets: 454 10*3/uL — ABNORMAL HIGH (ref 150–400)
RBC: 6.12 MIL/uL — ABNORMAL HIGH (ref 3.87–5.11)
RDW: 14.6 % (ref 11.5–15.5)
WBC: 15.2 10*3/uL — ABNORMAL HIGH (ref 4.0–10.5)
nRBC: 0 % (ref 0.0–0.2)

## 2019-01-02 LAB — URINALYSIS, ROUTINE W REFLEX MICROSCOPIC
Bilirubin Urine: NEGATIVE
Glucose, UA: 50 mg/dL — AB
Ketones, ur: NEGATIVE mg/dL
Leukocytes,Ua: NEGATIVE
Nitrite: NEGATIVE
Protein, ur: NEGATIVE mg/dL
Specific Gravity, Urine: 1.01 (ref 1.005–1.030)
pH: 5 (ref 5.0–8.0)

## 2019-01-02 LAB — LIPASE, BLOOD: Lipase: 30 U/L (ref 11–51)

## 2019-01-02 LAB — RAPID URINE DRUG SCREEN, HOSP PERFORMED
Amphetamines: NOT DETECTED
Barbiturates: NOT DETECTED
Benzodiazepines: NOT DETECTED
Cocaine: NOT DETECTED
Opiates: NOT DETECTED
Tetrahydrocannabinol: NOT DETECTED

## 2019-01-02 LAB — TROPONIN I: Troponin I: 0.98 ng/mL (ref <0.03)

## 2019-01-02 LAB — SARS CORONAVIRUS 2 BY RT PCR (HOSPITAL ORDER, PERFORMED IN ~~LOC~~ HOSPITAL LAB): SARS Coronavirus 2: NEGATIVE

## 2019-01-02 LAB — CBG MONITORING, ED: Glucose-Capillary: 168 mg/dL — ABNORMAL HIGH (ref 70–99)

## 2019-01-02 LAB — LACTIC ACID, PLASMA
Lactic Acid, Venous: 4 mmol/L (ref 0.5–1.9)
Lactic Acid, Venous: 7.9 mmol/L (ref 0.5–1.9)

## 2019-01-02 LAB — CK: Total CK: 952 U/L — ABNORMAL HIGH (ref 38–234)

## 2019-01-02 LAB — ACETAMINOPHEN LEVEL: Acetaminophen (Tylenol), Serum: <10 ug/mL — ABNORMAL LOW (ref 10–30)

## 2019-01-02 MED ORDER — METRONIDAZOLE IN NACL 5-0.79 MG/ML-% IV SOLN
500.0000 mg | Freq: Once | INTRAVENOUS | Status: AC
  Administered 2019-01-02: 500 mg via INTRAVENOUS
  Filled 2019-01-02: qty 100

## 2019-01-02 MED ORDER — ETOMIDATE 2 MG/ML IV SOLN
INTRAVENOUS | Status: AC | PRN
  Administered 2019-01-02: 20 mg via INTRAVENOUS

## 2019-01-02 MED ORDER — PROPOFOL 1000 MG/100ML IV EMUL
INTRAVENOUS | Status: AC
  Filled 2019-01-02: qty 100

## 2019-01-02 MED ORDER — PROPOFOL 1000 MG/100ML IV EMUL
5.0000 ug/kg/min | INTRAVENOUS | Status: DC
  Administered 2019-01-02: 20 ug/kg/min via INTRAVENOUS
  Administered 2019-01-02: 5 ug/kg/min via INTRAVENOUS
  Administered 2019-01-03: 10 ug/kg/min via INTRAVENOUS
  Administered 2019-01-03 – 2019-01-04 (×2): 20 ug/kg/min via INTRAVENOUS
  Administered 2019-01-05: 30 ug/kg/min via INTRAVENOUS
  Filled 2019-01-02 (×4): qty 100

## 2019-01-02 MED ORDER — IOHEXOL 350 MG/ML SOLN
100.0000 mL | Freq: Once | INTRAVENOUS | Status: AC | PRN
  Administered 2019-01-02: 100 mL via INTRAVENOUS

## 2019-01-02 MED ORDER — SODIUM CHLORIDE 0.9 % IV BOLUS (SEPSIS)
1000.0000 mL | Freq: Once | INTRAVENOUS | Status: AC
  Administered 2019-01-02: 1000 mL via INTRAVENOUS

## 2019-01-02 MED ORDER — ACETAMINOPHEN 650 MG RE SUPP
650.0000 mg | Freq: Once | RECTAL | Status: AC
  Administered 2019-01-02: 650 mg via RECTAL
  Filled 2019-01-02: qty 1

## 2019-01-02 MED ORDER — VANCOMYCIN HCL IN DEXTROSE 1-5 GM/200ML-% IV SOLN
1000.0000 mg | Freq: Once | INTRAVENOUS | Status: AC
  Administered 2019-01-02: 1000 mg via INTRAVENOUS
  Filled 2019-01-02: qty 200

## 2019-01-02 MED ORDER — ROCURONIUM BROMIDE 50 MG/5ML IV SOLN
INTRAVENOUS | Status: AC | PRN
  Administered 2019-01-02: 100 mg via INTRAVENOUS

## 2019-01-02 MED ORDER — SODIUM CHLORIDE 0.9 % IV SOLN
2.0000 g | Freq: Once | INTRAVENOUS | Status: AC
  Administered 2019-01-02: 2 g via INTRAVENOUS
  Filled 2019-01-02: qty 2
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
## 2019-01-02 NOTE — H&P (Signed)
NAME:  Kelsey Wade MRN:  [id number] DOB:  10/11/1960 LOS: 0 ADMISSION DATE:  01/02/2019 DATE OF SERVICE:  01/02/2019  CHIEF COMPLAINT:  Altered mental status   HISTORY & PHYSICAL  History of Present Illness  This 58 y.o. Caucasian female presented to the [facility] Emergency Department via EMS with complaints of unresponsive mental status.  The family reports that her last known well time was over 24 hours ago.  She was reportedly responsive to noxious stimuli only. She was found to be hypertensive and tachycardic. In the ER, she was found to be hypoxic despite 3 LPM via [place].  She failed to improve with NRB and was intubated in the ER.  At the time of clinical interview, the patient is intubated.  She is unresponsive to verbal or noxious stimuli.  She is on propofol for sedation.  She has a known history of substance abuse and stroke.  REVIEW OF SYSTEMS This patient is critically ill and cannot provide additional history nor review of systems due to mental status/unconsciousness and endotracheally intubated.   Past Medical/Surgical/Social/Family History   Past Medical History:  Diagnosis Date  . Anxiety   . Arthritis   . Chronic back pain   . Depression   . ETOH abuse   . GERD (gastroesophageal reflux disease)   . HTN (hypertension)   . Hyperlipidemia   . Stroke [facility])    June 2018  . Tobacco abuse     Past Surgical History:  Procedure Laterality Date  . APPENDECTOMY      Social History   Tobacco Use  . Smoking status: Current Every Day Smoker    Packs/day: 0.50    Types: Cigarettes  . Smokeless tobacco: Never Used  . Tobacco comment: less  Substance Use Topics  . Alcohol use: Yes    Alcohol/week: 2.0 standard drinks    Types: 2 Cans of beer per week    Comment: occasionally     Family History  Problem Relation Age of Onset  . Alcohol abuse Father   . Alcohol abuse Brother   . Breast cancer Maternal Aunt   . Colon cancer Neg Hx   .  Esophageal cancer Neg Hx   . Stomach cancer Neg Hx   . Rectal cancer Neg Hx     Procedures:  04/14: intubated in ER   Significant Diagnostic Tests:  N/A   Micro Data:   Results for orders placed or performed during the hospital encounter of 01/02/19  SARS Coronavirus 2 [facility] order, Performed in [place] hospital lab)     Status: None   Collection Time: 01/02/19  7:40 PM  Result Value Ref Range Status   SARS Coronavirus 2 NEGATIVE NEGATIVE Final    Comment: (NOTE) If result is NEGATIVE SARS-CoV-2 target nucleic acids are NOT DETECTED. The SARS-CoV-2 RNA is generally detectable in upper and lower  respiratory specimens during the acute phase of infection. The lowest  concentration of SARS-CoV-2 viral copies this assay can detect is 250  copies / mL. A negative result does not preclude SARS-CoV-2 infection  and should not be used as the sole basis for treatment or other  patient management decisions.  A negative result may occur with  improper specimen collection / handling, submission of specimen other  than nasopharyngeal swab, presence of viral mutation(s) within the  areas targeted by this assay, and inadequate number of viral copies  (<250 copies / mL). A negative result must be combined with clinical  observations, patient history, and epidemiological information. If result is POSITIVE SARS-CoV-2 target nucleic acids are DETECTED. The SARS-CoV-2 RNA is generally detectable in upper and lower  respiratory specimens dur ing the acute phase of infection.  Positive  results are indicative of active infection with SARS-CoV-2.  Clinical  correlation with patient history and other diagnostic information is  necessary to determine patient infection status.  Positive results do  not rule out bacterial infection or co-infection with other viruses. If result is PRESUMPTIVE POSTIVE SARS-CoV-2 nucleic acids MAY BE PRESENT.   A presumptive positive result was obtained on the  submitted specimen  and confirmed on repeat testing.  While 2019 novel coronavirus  (SARS-CoV-2) nucleic acids may be present in the submitted sample  additional confirmatory testing may be necessary for epidemiological  and / or clinical management purposes  to differentiate between  SARS-CoV-2 and other Sarbecovirus currently known to infect humans.  If clinically indicated additional testing with an alternate test  methodology [id number]) is advised. The SARS-CoV-2 RNA is generally  detectable in upper and lower respiratory sp ecimens during the acute  phase of infection. The expected result is Negative. Fact Sheet for Patients:  StrictlyIdeas.no Fact Sheet for Healthcare Providers: BankingDealers.co.za This test is not yet approved or cleared by the [place] FDA and has been authorized for detection and/or diagnosis of SARS-CoV-2 by FDA under an Emergency Use Authorization (EUA).  This EUA will remain in effect (meaning this test can be used) for the duration of the COVID-19 declaration under Section 564(b)(1) of the Act, 21 U.S.C. section 360bbb-3(b)(1), unless the authorization is terminated or revoked sooner. Performed at [facility], [place] [address], [place], [place] [address]       Antimicrobials:  Cefepime/vancomycin (4/14>>   Interim history/subjective:  N/A   Objective   BP (!) 160/103   Pulse (!) 130   Temp (!) 101.2 F (38.4 C) (Rectal)   Resp (!) 30   Ht 5\' 5"  (1.651 m)   Wt 62 kg   SpO2 100%   BMI 22.75 kg/m     Filed Weights   01/02/19 1931  Weight: 62 kg    Intake/Output Summary (Last 24 hours) at 01/02/2019 2324 Last data filed at 01/02/2019 2303 Gross per 24 hour  Intake 10.68 ml  Output -  Net 10.68 ml    Vent Mode: PRVC FiO2 (%):  [40 %-100 %] 40 % Set Rate:  [18 bmp] 18 bmp Vt Set:  [460 mL] 460 mL PEEP:  [5 cmH20] 5 cmH20 Plateau Pressure:  [15 cmH20] 15 cmH20    Examination: GENERAL: Intubated. Unkempt, disheveled. No acute distress. HEAD: normocephalic, atraumatic EYE: PERRLA, EOM intact, no scleral icterus, no pallor. THROAT/ORAL CAVITY: Normal dentition. No oral thrush. No exudate. Mucous membranes are moist. No tonsillar enlargement. ETT in situ. NECK: supple, no thyromegaly, no JVD, no lymphadenopathy. Trachea midline. CHEST/LUNG: symmetric in development and expansion. Coarse breath sounds. Rales and rhonchi bilaterally. HEART: Regular S1 and S2 without murmur, rub or gallop. ABDOMEN: soft, nontender, nondistended. Normoactive bowel sounds. No rebound. No guarding. No hepatosplenomegaly. EXTREMITIES: Edema: none. No cyanosis. No clubbing. 2+ DP pulses LYMPHATIC: no cervical/axillary/inguinal lymph nodes appreciated MUSCULOSKELETAL: RUE contracture.  No point tenderness. No bulk atrophy. Joints: normal.  SKIN:  Scattered diffuse petechial rash on hands, arms, legs. NEUROLOGIC: Doll's eyes intact. Corneal reflex intact. Spontaneous respirations intact. R gaze preference. Facial symmetry. Babinski absent.  DTR: 3+ @ R biceps, 2+ @ L biceps, 3+ @ R  patellar,  2+ @ L patellar.  Gait was not assessed.   Resolved Hospital Problem list   N/A   Assessment & Plan:   ASSESSMENT/PLAN:  ASSESSMENT (included in the Hospital Problem List)  Principal Problem:   Acute hypoxemic respiratory failure (HCC) Active Problems:   Cerebral edema (HCC)   HTN (hypertension)   Stroke (cerebrum) (HCC)   Elevated troponin   AKI (acute kidney injury) ([place])   Lactic acidosis   Tachycardia   By systems: PULMONARY  Acute hypoxemic respiratory failure Titrate vent settings based on ABG results Sedation with propofol, as needed   CARDIOVASCULAR  Hypertension  Tachycardia Maintain MAP 85 Vasotec. Hold dose for MAP < 85   RENAL  Acute kidney injury  INFECTIOUS  Leukocytosis On empiric cefepime/Flagyl/vancomycin Trach aspirate for Gram stain, C/S  Follow up blood culture   ENDOCRINE  No acute issues   NEUROLOGIC  Stroke, R MCA  Cerebral edema with R-->L shift Case discussed with Dr. Cheral Marker.  MRI planned.   GASTROINTESTINAL  GI PROPHYLAXIS: Protonix   HEMATOLOGIC  DVT PROPHYLAXIS: heparin, SCDs    PLAN/RECOMMENDATIONS   Admit to ICU under my service (Attending: Renee Pain, MD) with the diagnoses highlighted above in the active Hospital Problem List (ASSESSMENT).  NUTRITION: NPO for now    My assessment, plan of care, findings, medications, side effects, etc. were discussed with: nurse.   Best practice:  Diet: NPO Pain/Anxiety/Delirium protocol (if indicated): propofol VAP protocol (if indicated): Yes DVT prophylaxis: SCD, heparin GI prophylaxis: Protonix Glucose control: Accuchecks Mobility/Activity: Bedrest CODE STATUS:   Code Status: Prior Family Communication:  no family at the bedside Disposition: Admit to ICU   Labs   CBC: Recent Labs  Lab 01/02/19 1938 01/02/19 2133  WBC 15.2*  --   NEUTROABS 12.9*  --   HGB 16.7* 16.0*  HCT 54.7* 47.0*  MCV 89.4  --   PLT 454*  --     Basic Metabolic Panel: Recent Labs  Lab 01/02/19 1938 01/02/19 2133  NA 140 141  K 4.4 4.2  CL 106  --   CO2 13*  --   GLUCOSE 182*  --   BUN 29*  --   CREATININE 1.34*  --   CALCIUM 9.8  --    GFR: Estimated Creatinine Clearance: 42.2 mL/min (A) (by C-G formula based on SCr of 1.34 mg/dL (H)). Recent Labs  Lab 01/02/19 1938  WBC 15.2*  LATICACIDVEN 7.9*    Liver Function Tests: Recent Labs  Lab 01/02/19 1938  AST 44*  ALT 18  ALKPHOS 111  BILITOT 0.5  PROT 8.0  ALBUMIN 3.8   Recent Labs  Lab 01/02/19 1938  LIPASE 30   No results for input(s): AMMONIA in the last 168 hours.  ABG    Component Value Date/Time   PHART 7.340 (L) 01/02/2019 2133   PCO2ART 37.0 01/02/2019 2133   PO2ART 333.0 (H) 01/02/2019 2133   HCO3 19.6 (L) 01/02/2019 2133   TCO2 21 (L) 01/02/2019 2133    ACIDBASEDEF 5.0 (H) 01/02/2019 2133   O2SAT 100.0 01/02/2019 2133     Coagulation Profile: No results for input(s): INR, PROTIME in the last 168 hours.  Cardiac Enzymes: Recent Labs  Lab 01/02/19 1938  CKTOTAL 952*  TROPONINI 0.98*    HbA1C: Hgb A1c MFr Bld  Date/Time Value Ref Range Status  07/14/2018 06:58 AM 5.9 (H) 4.8 - 5.6 % Final    Comment:    (NOTE) Pre diabetes:  5.7%-6.4% Diabetes:              >6.4% Glycemic control for   <7.0% adults with diabetes   03/03/2017 03:51 AM 5.5 4.8 - 5.6 % Final    Comment:    (NOTE)         Pre-diabetes: 5.7 - 6.4         Diabetes: >6.4         Glycemic control for adults with diabetes: <7.0     CBG: Recent Labs  Lab 01/02/19 1936  GLUCAP 168*     Past Medical History   Past Medical History:  Diagnosis Date  . Anxiety   . Arthritis   . Chronic back pain   . Depression   . ETOH abuse   . GERD (gastroesophageal reflux disease)   . HTN (hypertension)   . Hyperlipidemia   . Stroke [facility])    June 2018  . Tobacco abuse       Surgical History    Past Surgical History:  Procedure Laterality Date  . APPENDECTOMY        Social History   Social History   Socioeconomic History  . Marital status: Divorced    Spouse name: Not on file  . Number of children: 2  . Years of education: Not on file  . Highest education level: 9th grade  Occupational History  . Occupation: disabled  Social Needs  . Financial resource strain: Not on file  . Food insecurity:    Worry: Not on file    Inability: Not on file  . Transportation needs:    Medical: Not on file    Non-medical: Not on file  Tobacco Use  . Smoking status: Current Every Day Smoker    Packs/day: 0.50    Types: Cigarettes  . Smokeless tobacco: Never Used  . Tobacco comment: less  Substance and Sexual Activity  . Alcohol use: Yes    Alcohol/week: 2.0 standard drinks    Types: 2 Cans of beer per week    Comment: occasionally   . Drug use:  Yes    Types: Marijuana    Comment: quit 2 months ago 10-20-17  . Sexual activity: Not on file  Lifestyle  . Physical activity:    Days per week: Not on file    Minutes per session: Not on file  . Stress: Not on file  Relationships  . Social connections:    Talks on phone: Not on file    Gets together: Not on file    Attends religious service: Not on file    Active member of club or organization: Not on file    Attends meetings of clubs or organizations: Not on file    Relationship status: Not on file  Other Topics Concern  . Not on file  Social History Narrative   Divorced, lives in a 1 story home, with 2 steps to enter. Drinks 2 cups of coffee, and one Mtn. Dew a day. Prior to becoming disabled by CVA, patient was employed in house keeping.       Family History    Family History  Problem Relation Age of Onset  . Alcohol abuse Father   . Alcohol abuse Brother   . Breast cancer Maternal Aunt   . Colon cancer Neg Hx   . Esophageal cancer Neg Hx   . Stomach cancer Neg Hx   . Rectal cancer Neg Hx    family history includes Alcohol abuse in her brother  and father; Breast cancer in her maternal aunt. There is no history of Colon cancer, Esophageal cancer, Stomach cancer, or Rectal cancer.    Allergies No Known Allergies    Current Medications  Current Facility-Administered Medications:  .  propofol (DIPRIVAN) 1000 MG/100ML infusion, 5-80 mcg/kg/min, Intravenous, Continuous, Pfeiffer, Marcy, MD, Last Rate: 11.16 mL/hr at 01/02/19 2303, 30 mcg/kg/min at 01/02/19 2303 .  propofol (DIPRIVAN) 1000 MG/100ML infusion, , , ,  .  vancomycin (VANCOCIN) IVPB 1000 mg/200 mL premix, 1,000 mg, Intravenous, Once, Harris, Abigail, PA-C, Last Rate: 200 mL/hr at 01/02/19 2300, 1,000 mg at 01/02/19 2300  Current Outpatient Medications:  .  albuterol (PROAIR HFA) 108 (90 Base) MCG/ACT inhaler, Inhale 2 puffs into the lungs every 6 (six) hours as needed for wheezing or shortness of breath.,  Disp: , Rfl:  .  amLODipine (NORVASC) 10 MG tablet, Take 10 mg by mouth daily., Disp: , Rfl: 2 .  aspirin 325 MG tablet, Take 325 mg by mouth daily., Disp: , Rfl:  .  atorvastatin (LIPITOR) 40 MG tablet, Take 1 tablet (40 mg total) by mouth daily at 6 PM., Disp: 30 tablet, Rfl: 0 .  baclofen (LIORESAL) 10 MG tablet, Take 1 tablet (10 mg total) by mouth 3 (three) times daily., Disp: 90 tablet, Rfl: 1 .  famotidine (PEPCID) 40 MG tablet, Take 1 tablet (40 mg total) by mouth at bedtime. (Patient taking differently: Take 40 mg by mouth daily. ), Disp: 30 tablet, Rfl: 1 .  folic acid (FOLVITE) 1 MG tablet, Take 1 tablet (1 mg total) by mouth daily., Disp: , Rfl:  .  gabapentin (NEURONTIN) 300 MG capsule, Take 300 mg by mouth daily after breakfast., Disp: , Rfl:  .  hydrOXYzine (ATARAX/VISTARIL) 10 MG tablet, Take 1-2 tab as needed for anxiety and sleep, Disp: 45 tablet, Rfl: 1 .  levETIRAcetam (KEPPRA) 500 MG tablet, Take 1 tablet (500 mg total) by mouth 2 (two) times daily., Disp: 60 tablet, Rfl: 0 .  Multiple Vitamin (MULTIVITAMIN WITH MINERALS) TABS tablet, Take 1 tablet by mouth daily., Disp: , Rfl:  .  polyethylene glycol (MIRALAX / GLYCOLAX) packet, Take 17 g by mouth daily as needed for moderate constipation or severe constipation., Disp: 14 each, Rfl: 0 .  venlafaxine XR (EFFEXOR-XR) 150 MG 24 hr capsule, Take 1 capsule (150 mg total) by mouth daily with breakfast., Disp: 30 capsule, Rfl: 1 .  Vitamin D, Ergocalciferol, (DRISDOL) 50000 units CAPS capsule, Take 50,000 Units by mouth every Saturday., Disp: , Rfl: 3   Home Medications  Prior to Admission medications   Medication Sig Start Date End Date Taking? Authorizing Provider  albuterol (PROAIR HFA) 108 (90 Base) MCG/ACT inhaler Inhale 2 puffs into the lungs every 6 (six) hours as needed for wheezing or shortness of breath.    [provider]  amLODipine (NORVASC) 10 MG tablet Take 10 mg by mouth daily. 01/27/18   [provider]  aspirin 325 MG tablet Take 325 mg by mouth daily.    [provider]  atorvastatin (LIPITOR) 40 MG tablet Take 1 tablet (40 mg total) by mouth daily at 6 PM. 07/16/18 01/02/19  Amin, Jeanella Flattery, MD  baclofen (LIORESAL) 10 MG tablet Take 1 tablet (10 mg total) by mouth 3 (three) times daily. 09/22/18   Kirsteins, Luanna Salk, MD  famotidine (PEPCID) 40 MG tablet Take 1 tablet (40 mg total) by mouth at bedtime. Patient taking differently: Take 40 mg by mouth daily.  10/03/17 01/02/19  Dorena Dew, FNP  folic acid (FOLVITE) 1 MG tablet Take 1 tablet (1 mg total) by mouth daily. 01/07/18   Samuella Cota, MD  gabapentin (NEURONTIN) 300 MG capsule Take 300 mg by mouth daily after breakfast.    [provider]  hydrOXYzine (ATARAX/VISTARIL) 10 MG tablet Take 1-2 tab as needed for anxiety and sleep 08/16/18   Arfeen, Arlyce Harman, MD  levETIRAcetam (KEPPRA) 500 MG tablet Take 1 tablet (500 mg total) by mouth 2 (two) times daily. 07/16/18 01/02/19  Damita Lack, MD  Multiple Vitamin (MULTIVITAMIN WITH MINERALS) TABS tablet Take 1 tablet by mouth daily. 01/07/18   Samuella Cota, MD  polyethylene glycol [facility] Floria Raveling) packet Take 17 g by mouth daily as needed for moderate constipation or severe constipation. 07/16/18   Damita Lack, MD  venlafaxine XR (EFFEXOR-XR) 150 MG 24 hr capsule Take 1 capsule (150 mg total) by mouth daily with breakfast. 08/16/18 08/16/19  Arfeen, Arlyce Harman, MD  Vitamin D, Ergocalciferol, (DRISDOL) 50000 units CAPS capsule Take 50,000 Units by mouth every Saturday. 04/17/18   [provider]      Critical care time: 60 minutes.  The treatment and management of the patient's condition was required based on the threat of imminent deterioration. This time reflects time spent by the physician evaluating, providing care and managing the critically ill patient's care. The time was spent at the immediate bedside (or on the same  floor/unit and dedicated to this patient's care). Time involved in separately billable procedures is NOT included int he critical care time indicated above. Family meeting and update time may be included above if and only if the patient is unable/incompetent to participate in clinical interview and/or decision making, and the discussion was necessary to determining treatment decisions.   Renee Pain, MD Board Certified by the ABIM, [place] Pager: [phone]

## 2019-01-02 NOTE — ED Triage Notes (Signed)
BIB EMS from home. LKW unknown, guessing ~ 24 hrs ago. Family states pt has been laying on floor "unresponsive" for some time... Finally became concerned today and called 911. Pt only responds to pain. Hypertensive and tachycardic.
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
## 2019-01-02 NOTE — ED Notes (Signed)
EDP aware sats low 80's on 3L [place]. Abigail PA and Dr Johnney Killian @ bedside. Pt placed on NRB.
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
## 2019-01-03 ENCOUNTER — Inpatient Hospital Stay (HOSPITAL_COMMUNITY): Payer: Medicaid Other

## 2019-01-03 DIAGNOSIS — I248 Other forms of acute ischemic heart disease: Secondary | ICD-10-CM | POA: Diagnosis present

## 2019-01-03 DIAGNOSIS — W19XXXA Unspecified fall, initial encounter: Secondary | ICD-10-CM | POA: Diagnosis present

## 2019-01-03 DIAGNOSIS — R9401 Abnormal electroencephalogram [EEG]: Secondary | ICD-10-CM | POA: Diagnosis present

## 2019-01-03 DIAGNOSIS — G8929 Other chronic pain: Secondary | ICD-10-CM | POA: Diagnosis present

## 2019-01-03 DIAGNOSIS — G936 Cerebral edema: Secondary | ICD-10-CM | POA: Diagnosis present

## 2019-01-03 DIAGNOSIS — E872 Acidosis, unspecified: Secondary | ICD-10-CM | POA: Insufficient documentation

## 2019-01-03 DIAGNOSIS — K219 Gastro-esophageal reflux disease without esophagitis: Secondary | ICD-10-CM | POA: Diagnosis present

## 2019-01-03 DIAGNOSIS — R4701 Aphasia: Secondary | ICD-10-CM | POA: Diagnosis present

## 2019-01-03 DIAGNOSIS — R000 Tachycardia, unspecified: Secondary | ICD-10-CM | POA: Insufficient documentation

## 2019-01-03 DIAGNOSIS — H518 Other specified disorders of binocular movement: Secondary | ICD-10-CM | POA: Diagnosis present

## 2019-01-03 DIAGNOSIS — I10 Essential (primary) hypertension: Secondary | ICD-10-CM | POA: Diagnosis present

## 2019-01-03 DIAGNOSIS — J189 Pneumonia, unspecified organism: Secondary | ICD-10-CM | POA: Diagnosis present

## 2019-01-03 DIAGNOSIS — J9601 Acute respiratory failure with hypoxia: Secondary | ICD-10-CM | POA: Diagnosis present

## 2019-01-03 DIAGNOSIS — Z20828 Contact with and (suspected) exposure to other viral communicable diseases: Secondary | ICD-10-CM | POA: Diagnosis present

## 2019-01-03 DIAGNOSIS — N179 Acute kidney failure, unspecified: Secondary | ICD-10-CM | POA: Diagnosis present

## 2019-01-03 DIAGNOSIS — M549 Dorsalgia, unspecified: Secondary | ICD-10-CM | POA: Diagnosis present

## 2019-01-03 DIAGNOSIS — S0003XA Contusion of scalp, initial encounter: Secondary | ICD-10-CM | POA: Diagnosis present

## 2019-01-03 DIAGNOSIS — G9389 Other specified disorders of brain: Secondary | ICD-10-CM | POA: Diagnosis present

## 2019-01-03 DIAGNOSIS — I69351 Hemiplegia and hemiparesis following cerebral infarction affecting right dominant side: Secondary | ICD-10-CM | POA: Diagnosis not present

## 2019-01-03 DIAGNOSIS — E785 Hyperlipidemia, unspecified: Secondary | ICD-10-CM | POA: Diagnosis present

## 2019-01-03 DIAGNOSIS — I6783 Posterior reversible encephalopathy syndrome: Secondary | ICD-10-CM | POA: Diagnosis present

## 2019-01-03 DIAGNOSIS — G8194 Hemiplegia, unspecified affecting left nondominant side: Secondary | ICD-10-CM | POA: Diagnosis present

## 2019-01-03 DIAGNOSIS — F1010 Alcohol abuse, uncomplicated: Secondary | ICD-10-CM | POA: Diagnosis present

## 2019-01-03 DIAGNOSIS — R4182 Altered mental status, unspecified: Secondary | ICD-10-CM | POA: Diagnosis present

## 2019-01-03 DIAGNOSIS — R569 Unspecified convulsions: Secondary | ICD-10-CM | POA: Diagnosis present

## 2019-01-03 DIAGNOSIS — R778 Other specified abnormalities of plasma proteins: Secondary | ICD-10-CM | POA: Diagnosis present

## 2019-01-03 DIAGNOSIS — R7989 Other specified abnormal findings of blood chemistry: Secondary | ICD-10-CM

## 2019-01-03 DIAGNOSIS — F1410 Cocaine abuse, uncomplicated: Secondary | ICD-10-CM | POA: Diagnosis present

## 2019-01-03 LAB — GLUCOSE, CAPILLARY
Glucose-Capillary: 112 mg/dL — ABNORMAL HIGH (ref 70–99)
Glucose-Capillary: 117 mg/dL — ABNORMAL HIGH (ref 70–99)
Glucose-Capillary: 118 mg/dL — ABNORMAL HIGH (ref 70–99)
Glucose-Capillary: 124 mg/dL — ABNORMAL HIGH (ref 70–99)

## 2019-01-03 LAB — POCT I-STAT 7, (LYTES, BLD GAS, ICA,H+H)
Acid-base deficit: 2 mmol/L (ref 0.0–2.0)
Bicarbonate: 19.9 mmol/L — ABNORMAL LOW (ref 20.0–28.0)
Calcium, Ion: 1.16 mmol/L (ref 1.15–1.40)
HCT: 44 % (ref 36.0–46.0)
Hemoglobin: 15 g/dL (ref 12.0–15.0)
O2 Saturation: 100 %
Patient temperature: 103.8
Potassium: 3.7 mmol/L (ref 3.5–5.1)
Sodium: 139 mmol/L (ref 135–145)
TCO2: 21 mmol/L — ABNORMAL LOW (ref 22–32)
pCO2 arterial: 31.9 mmHg — ABNORMAL LOW (ref 32.0–48.0)
pH, Arterial: 7.415 (ref 7.350–7.450)
pO2, Arterial: 188 mmHg — ABNORMAL HIGH (ref 83.0–108.0)

## 2019-01-03 LAB — BLOOD CULTURE ID PANEL (REFLEXED)

## 2019-01-03 LAB — BASIC METABOLIC PANEL
Anion gap: 14 (ref 5–15)
BUN: 23 mg/dL — ABNORMAL HIGH (ref 6–20)
CO2: 17 mmol/L — ABNORMAL LOW (ref 22–32)
Calcium: 8.6 mg/dL — ABNORMAL LOW (ref 8.9–10.3)
Chloride: 110 mmol/L (ref 98–111)
Creatinine, Ser: 0.94 mg/dL (ref 0.44–1.00)
GFR calc Af Amer: >60 mL/min (ref >60)
GFR calc non Af Amer: >60 mL/min (ref >60)
Glucose, Bld: 156 mg/dL — ABNORMAL HIGH (ref 70–99)
Potassium: 4.6 mmol/L (ref 3.5–5.1)
Sodium: 141 mmol/L (ref 135–145)

## 2019-01-03 LAB — CBC
HCT: 50.4 % — ABNORMAL HIGH (ref 36.0–46.0)
HCT: 51.5 % — ABNORMAL HIGH (ref 36.0–46.0)
Hemoglobin: 15.8 g/dL — ABNORMAL HIGH (ref 12.0–15.0)
Hemoglobin: 16.2 g/dL — ABNORMAL HIGH (ref 12.0–15.0)
MCH: 27.7 pg (ref 26.0–34.0)
MCH: 28.1 pg (ref 26.0–34.0)
MCHC: 31.3 g/dL (ref 30.0–36.0)
MCHC: 31.5 g/dL (ref 30.0–36.0)
MCV: 88.4 fL (ref 80.0–100.0)
MCV: 89.4 fL (ref 80.0–100.0)
Platelets: 331 10*3/uL (ref 150–400)
Platelets: 435 10*3/uL — ABNORMAL HIGH (ref 150–400)
RBC: 5.7 MIL/uL — ABNORMAL HIGH (ref 3.87–5.11)
RBC: 5.76 MIL/uL — ABNORMAL HIGH (ref 3.87–5.11)
RDW: 14.7 % (ref 11.5–15.5)
RDW: 14.8 % (ref 11.5–15.5)
WBC: 16.3 10*3/uL — ABNORMAL HIGH (ref 4.0–10.5)
WBC: 22.3 10*3/uL — ABNORMAL HIGH (ref 4.0–10.5)
nRBC: 0 % (ref 0.0–0.2)
nRBC: 0 % (ref 0.0–0.2)

## 2019-01-03 LAB — CREATININE, SERUM
Creatinine, Ser: 1.1 mg/dL — ABNORMAL HIGH (ref 0.44–1.00)
GFR calc Af Amer: >60 mL/min (ref >60)
GFR calc non Af Amer: 56 mL/min — ABNORMAL LOW (ref >60)

## 2019-01-03 LAB — PROCALCITONIN: Procalcitonin: <0.1 ng/mL

## 2019-01-03 LAB — MRSA PCR SCREENING: MRSA by PCR: NEGATIVE

## 2019-01-03 LAB — LACTIC ACID, PLASMA
Lactic Acid, Venous: 2.9 mmol/L (ref 0.5–1.9)
Lactic Acid, Venous: 3.4 mmol/L (ref 0.5–1.9)

## 2019-01-03 LAB — TRIGLYCERIDES: Triglycerides: 254 mg/dL — ABNORMAL HIGH (ref <150)

## 2019-01-03 LAB — MAGNESIUM: Magnesium: 1.8 mg/dL (ref 1.7–2.4)

## 2019-01-03 LAB — PHOSPHORUS: Phosphorus: 3.4 mg/dL (ref 2.5–4.6)

## 2019-01-03 MED ORDER — VALPROATE SODIUM 500 MG/5ML IV SOLN
2000.0000 mg | Freq: Once | INTRAVENOUS | Status: AC
  Administered 2019-01-03: 2000 mg via INTRAVENOUS
  Filled 2019-01-03: qty 20

## 2019-01-03 MED ORDER — METRONIDAZOLE IN NACL 5-0.79 MG/ML-% IV SOLN
500.0000 mg | Freq: Three times a day (TID) | INTRAVENOUS | Status: DC
  Administered 2019-01-03 – 2019-01-04 (×3): 500 mg via INTRAVENOUS
  Filled 2019-01-03 (×3): qty 100

## 2019-01-03 MED ORDER — LABETALOL HCL 5 MG/ML IV SOLN
10.0000 mg | INTRAVENOUS | Status: DC | PRN
  Administered 2019-01-03 – 2019-01-05 (×6): 10 mg via INTRAVENOUS
  Filled 2019-01-03 (×6): qty 4

## 2019-01-03 MED ORDER — CHLORHEXIDINE GLUCONATE 0.12% ORAL RINSE (MEDLINE KIT)
15.0000 mL | Freq: Two times a day (BID) | OROMUCOSAL | Status: DC
  Administered 2019-01-03 – 2019-01-08 (×11): 15 mL via OROMUCOSAL

## 2019-01-03 MED ORDER — SODIUM CHLORIDE 0.9 % IV SOLN
2000.0000 mg | Freq: Once | INTRAVENOUS | Status: AC
  Administered 2019-01-03: 2000 mg via INTRAVENOUS
  Filled 2019-01-03: qty 20

## 2019-01-03 MED ORDER — LORAZEPAM 2 MG/ML IJ SOLN
2.0000 mg | INTRAMUSCULAR | Status: AC
  Administered 2019-01-03: 2 mg via INTRAVENOUS
  Filled 2019-01-03: qty 1

## 2019-01-03 MED ORDER — DEXTROSE-NACL 5-0.45 % IV SOLN
INTRAVENOUS | Status: DC
  Administered 2019-01-03 – 2019-01-08 (×6): via INTRAVENOUS

## 2019-01-03 MED ORDER — HEPARIN SODIUM (PORCINE) 5000 UNIT/ML IJ SOLN
5000.0000 [IU] | Freq: Three times a day (TID) | INTRAMUSCULAR | Status: DC
  Administered 2019-01-03 – 2019-01-08 (×16): 5000 [IU] via SUBCUTANEOUS
  Filled 2019-01-03 (×16): qty 1

## 2019-01-03 MED ORDER — VITAMIN B-1 100 MG PO TABS
100.0000 mg | ORAL_TABLET | Freq: Every day | ORAL | Status: DC
  Administered 2019-01-04 – 2019-01-05 (×2): 100 mg
  Filled 2019-01-03 (×2): qty 1

## 2019-01-03 MED ORDER — VALPROATE SODIUM 500 MG/5ML IV SOLN
500.0000 mg | Freq: Two times a day (BID) | INTRAVENOUS | Status: DC
  Administered 2019-01-04 – 2019-01-07 (×7): 500 mg via INTRAVENOUS
  Filled 2019-01-03 (×9): qty 5

## 2019-01-03 MED ORDER — ORAL CARE MOUTH RINSE
15.0000 mL | OROMUCOSAL | Status: DC
  Administered 2019-01-03 – 2019-01-05 (×21): 15 mL via OROMUCOSAL

## 2019-01-03 MED ORDER — ADULT MULTIVITAMIN W/MINERALS CH
1.0000 | ORAL_TABLET | Freq: Every day | ORAL | Status: DC
  Administered 2019-01-04 – 2019-01-05 (×2): 1
  Filled 2019-01-03 (×2): qty 1

## 2019-01-03 MED ORDER — SODIUM CHLORIDE 0.9 % IV SOLN
2.0000 g | Freq: Two times a day (BID) | INTRAVENOUS | Status: DC
  Administered 2019-01-03 – 2019-01-04 (×4): 2 g via INTRAVENOUS
  Filled 2019-01-03 (×8): qty 2

## 2019-01-03 MED ORDER — PANTOPRAZOLE SODIUM 40 MG IV SOLR
40.0000 mg | Freq: Every day | INTRAVENOUS | Status: DC
  Administered 2019-01-03 – 2019-01-06 (×5): 40 mg via INTRAVENOUS
  Filled 2019-01-03 (×6): qty 40

## 2019-01-03 MED ORDER — MIDAZOLAM HCL 2 MG/2ML IJ SOLN
4.0000 mg | Freq: Once | INTRAMUSCULAR | Status: AC
  Administered 2019-01-03: 4 mg via INTRAVENOUS

## 2019-01-03 MED ORDER — ENALAPRILAT 1.25 MG/ML IV SOLN
1.2500 mg | Freq: Four times a day (QID) | INTRAVENOUS | Status: DC
  Administered 2019-01-03: 1.25 mg via INTRAVENOUS
  Filled 2019-01-03: qty 1

## 2019-01-03 MED ORDER — LEVETIRACETAM IN NACL 1000 MG/100ML IV SOLN
1000.0000 mg | Freq: Two times a day (BID) | INTRAVENOUS | Status: DC
  Administered 2019-01-03 – 2019-01-04 (×3): 1000 mg via INTRAVENOUS
  Filled 2019-01-03 (×3): qty 100

## 2019-01-03 MED ORDER — FOLIC ACID 1 MG PO TABS
1.0000 mg | ORAL_TABLET | Freq: Every day | ORAL | Status: DC
  Administered 2019-01-04 – 2019-01-05 (×2): 1 mg
  Filled 2019-01-03 (×2): qty 1

## 2019-01-03 MED ORDER — PRO-STAT SUGAR FREE PO LIQD
30.0000 mL | Freq: Every day | ORAL | Status: DC
  Administered 2019-01-04 – 2019-01-08 (×5): 30 mL
  Filled 2019-01-03 (×5): qty 30

## 2019-01-03 MED ORDER — SODIUM CHLORIDE 0.9 % IV BOLUS
1000.0000 mL | Freq: Once | INTRAVENOUS | Status: AC
  Administered 2019-01-03: 02:00:00 1000 mL via INTRAVENOUS

## 2019-01-03 MED ORDER — PROPOFOL 1000 MG/100ML IV EMUL: 0.0000 ug/kg/min | INTRAVENOUS | Status: DC

## 2019-01-03 MED ORDER — VITAL 1.5 CAL PO LIQD
1000.0000 mL | ORAL | Status: DC
  Administered 2019-01-03 – 2019-01-05 (×2): 1000 mL
  Filled 2019-01-03 (×5): qty 1000

## 2019-01-03 MED ORDER — MIDAZOLAM HCL 2 MG/2ML IJ SOLN
INTRAMUSCULAR | Status: AC
  Filled 2019-01-03: qty 4

## 2019-01-03 NOTE — Progress Notes (Signed)
Patient transported on vent from ED to [id number] without complication.

## 2019-01-03 NOTE — Progress Notes (Signed)
EEG reviewed at 11:15 PM. Diffuse slowing is noted. Portions of the record are asymmetric, but with no electrographic seizures seen.   Electronically signed: Dr. Kerney Elbe

## 2019-01-03 NOTE — Progress Notes (Addendum)
PHARMACY - PHYSICIAN COMMUNICATION CRITICAL VALUE ALERT - BLOOD CULTURE IDENTIFICATION (BCID)  Kelsey Wade is an 58 y.o. female who presented to [facility] on 01/02/2019 with a chief complaint of being found unresponsive.  Assessment:  57YOF who was found unresponsive, on antibiotics for possible pneumonia. BCID resulted with 1/5 blood cultures growing methicillin sensitive Coag Negative Staph, likely a contaminant. Tmax 103.9, WBC 16.3, LA 3.4, PCT <0.1  Name of physician (or Provider) Contacted: Dr. Chase Caller  Current antibiotics: Cefepime and flagyl  Changes to prescribed antibiotics recommended:  Patient is on recommended antibiotics - No changes needed  Results for orders placed or performed during the hospital encounter of 01/02/19  Blood Culture ID Panel (Reflexed) (Collected: 01/02/2019  7:43 PM)  Result Value Ref Range   Enterococcus species NOT DETECTED NOT DETECTED   Listeria monocytogenes NOT DETECTED NOT DETECTED   Staphylococcus species DETECTED (A) NOT DETECTED   Staphylococcus aureus (BCID) NOT DETECTED NOT DETECTED   Methicillin resistance NOT DETECTED NOT DETECTED   Streptococcus species NOT DETECTED NOT DETECTED   Streptococcus agalactiae NOT DETECTED NOT DETECTED   Streptococcus pneumoniae NOT DETECTED NOT DETECTED   Streptococcus pyogenes NOT DETECTED NOT DETECTED   Acinetobacter baumannii NOT DETECTED NOT DETECTED   Enterobacteriaceae species NOT DETECTED NOT DETECTED   Enterobacter cloacae complex NOT DETECTED NOT DETECTED   Escherichia coli NOT DETECTED NOT DETECTED   Klebsiella oxytoca NOT DETECTED NOT DETECTED   Klebsiella pneumoniae NOT DETECTED NOT DETECTED   Proteus species NOT DETECTED NOT DETECTED   Serratia marcescens NOT DETECTED NOT DETECTED   Haemophilus influenzae NOT DETECTED NOT DETECTED   Neisseria meningitidis NOT DETECTED NOT DETECTED   Pseudomonas aeruginosa NOT DETECTED NOT DETECTED   Candida albicans NOT DETECTED NOT DETECTED   Candida glabrata NOT DETECTED NOT DETECTED   Candida krusei NOT DETECTED NOT DETECTED   Candida parapsilosis NOT DETECTED NOT DETECTED   Candida tropicalis NOT DETECTED NOT DETECTED   Jackson Latino, PharmD PGY1 Pharmacy Resident Phone [phone] 01/03/2019     5:45 PM

## 2019-01-03 NOTE — Progress Notes (Signed)
Pharmacy Antibiotic Note  Kelsey Wade is a 58 y.o. female admitted on 01/02/2019 with possible  pneumonia.  Pharmacy has been consulted for cefepime/Flagyl dosing. SCr improved to 0.94. Tmax/24h 103.8, WBC down to 16.8.  Plan: Cefepime 2g IV q12h Flagyl 500mg  IV q8h Monitor clinical progress, c/s, renal function F/u de-escalation plan/LOT   Height: 5\' 5"  (165.1 cm) Weight: 144 lb 10 oz (65.6 kg) IBW/kg (Calculated) : 57  Temp (24hrs), Avg:102.2 F (39 C), Min:100 F (37.8 C), Max:103.9 F (39.9 C)  Recent Labs  Lab 01/02/19 1938 01/02/19 2332 01/03/19 0037 01/03/19 0237 01/03/19 0605  WBC 15.2*  --  22.3* 16.3*  --   CREATININE 1.34*  --  1.10* 0.94  --   LATICACIDVEN 7.9* 4.0*  --  2.9* 3.4*    Estimated Creatinine Clearance: 60.1 mL/min (by C-G formula based on SCr of 0.94 mg/dL).    No Known Allergies  Antimicrobials this admission: 4/14 cefepime x 1; 4/15>> 4/14 vanc x 1 4/14 flagyl x 1; 4/15>>  Dose adjustments this admission:  Microbiology results: 4/14 covid - neg 4/14 bcx -  4/15 bcx - 4/15 mrsa pcr - neg 4/15 TA - rare GPC 4/15 UC -  Elicia Lamp, PharmD, BCPS Please check AMION for all [place] contact numbers Clinical Pharmacist 01/03/2019 11:03 AM

## 2019-01-03 NOTE — Procedures (Signed)
History: 58 year old female with suspected seizure  Sedation: Propofol  Technique: This is a 21 channel routine scalp EEG performed at the bedside with bipolar and monopolar montages arranged in accordance to the international 10/20 system of electrode placement. One channel was dedicated to EKG recording.    Background: There is persistent polymorphic delta activity with a wide field in the right hemisphere, the frontotemporal he predominant.  She has occasional sleep spindles with clear attenuation on the right side.  There were no definite epileptiform discharges seen.  There is a poorly sustained and poorly formed posterior dominant rhythm which is relatively low voltage of 9 Hz that is seen bilaterally.  Photic stimulation: Physiologic driving is not performed  EEG Abnormalities: 1) right frontotemporal planar for delta activity 2) attenuation of sleep structures on the right side  Clinical Interpretation: This EEG is consistent with a focal cerebral dysfunction in the right frontotemporal region. There was no seizure or seizure predisposition recorded on this study. Please note that lack of epileptiform activity on EEG does not preclude the possibility of epilepsy.   Roland Rack, MD [facility] [phone]  If 7pm- 7am, please page neurology on call as listed in [place].

## 2019-01-03 NOTE — Progress Notes (Signed)
EEG complete - results pending 

## 2019-01-03 NOTE — Progress Notes (Signed)
Patient was transported to MRI & back to 4N25 without any complications.

## 2019-01-03 NOTE — Progress Notes (Signed)
NAME:  Kelsey Wade MRN:  [id number] DOB:  1961/02/01 LOS: 0 ADMISSION DATE:  01/02/2019 DATE OF SERVICE:  01/02/2019  CHIEF COMPLAINT:  Altered mental status   HISTORY & PHYSICAL  History of Present Illness  This 58 y.o. Caucasian female presented to the [facility] Emergency Department via EMS with complaints of unresponsive mental status.  The family reports that her last known well time was over 24 hours PTA.  She was reportedly responsive to noxious stimuli only. She was found to be hypertensive and tachycardic. In the ER, she was found to be hypoxic despite 3 LPM via [place].  She failed to improve with NRB and was intubated in the ER.    She has a known history of substance abuse and stroke/ drug screen neg           Procedures:  04/14: intubated in ER   Significant Diagnostic Tests:   Head CT 4/14: 1. Stable chronic left ICA occlusion. 2. Stable right proximal ICA moderate 50% stenosis with fibrofatty plaque. 3. Stable left common carotid artery origin 50% stenosis with fibrofatty plaque. 4. 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 5. Stable severe right vertebral artery origin stenosis. 6. Edema within the posterior soft tissues of neck and scalp, probably related to being on floor for long duration.    Micro studies: MRSA screen  4/15 > neg  Covid-19 screen 01/02/2019 > neg  BC x  2  01/03/2019 >>> UC 4/15 >>>  ET 4/15 >>> mod wbc, rare gpc    Antimicrobials:  Vanc 4/14 only  Cefepime/vancomycin/flagyl  4/14 >>>   Interim history/subjective:  Sedated on propofol       Objective   BP (!) 130/97   Pulse (!) 101   Temp 100.2 F (37.9 C)   Resp (!) 25   Ht 5\' 5"  (1.651 m)   Wt 65.6 kg   SpO2 100%   BMI 24.07 kg/m     Filed Weights   01/02/19 1931 01/03/19 0110  Weight: 62 kg 65.6 kg    Intake/Output Summary (Last 24 hours) at 01/03/2019 1038 Last data filed at  01/03/2019 0800 Gross per 24 hour  Intake 90.4 ml  Output 950 ml  Net -859.6 ml    Vent Mode: PRVC FiO2 (%):  [40 %-100 %] 40 % Set Rate:  [18 bmp] 18 bmp Vt Set:  [460 mL] 460 mL PEEP:  [5 cmH20] 5 cmH20 Plateau Pressure:  [14 cmH20-22 cmH20] 17 cmH20   Examination: wf > stated age sedated on vent   Pt sedated on propofol, not responding to verbal No jvd Oropharynx et Neck supple Lungs with distant bs  bilaterally RRR no s3 or or sign murmur Abd soft excursion  Extr warm with no edema or clubbing noted   cxr 4/15 ordered/pending      Resolved Hospital Problem list   N/A   Assessment & Plan:    1)  Acute hypoxemic respiratory failure (HCC) secondary to AMS ? Post ictal? Asp pna - full vent support but note air trapping with RR 24 spont so needs higher [place] vol and continued sedation  -  Repeat cxr / continue max/flagyl for now pending cultures as had temp 103 on admit and now afeb   2) AMS w/u in progress by neuro - added thiamine/ folate 4/15 but no need for full ciwa protocol while on propofol      3) HTN (hypertension) -  D/c acei for now / maint iv fluids/ add back IV lopressor prn / ? Catapres patch if trending back up      Best practice:  Diet: NPO Pain/Anxiety/Delirium protocol (if indicated): propofol VAP protocol (if indicated): Yes DVT prophylaxis: SCD, heparin GI prophylaxis: Protonix Glucose control: Accuchecks Mobility/Activity: Bedrest CODE STATUS:   Code Status: Full Code Family Communication:  no family at the bedside Disposition: Admit to ICU   Labs   CBC: Recent Labs  Lab 01/02/19 1938 01/02/19 2133 01/03/19 0037 01/03/19 0227 01/03/19 0237  WBC 15.2*  --  22.3*  --  16.3*  NEUTROABS 12.9*  --   --   --   --   HGB 16.7* 16.0* 16.2* 15.0 15.8*  HCT 54.7* 47.0* 51.5* 44.0 50.4*  MCV 89.4  --  89.4  --  88.4  PLT 454*  --  435*  --  833    Basic Metabolic Panel: Recent Labs  Lab 01/02/19 1938 01/02/19 2133 01/03/19  0037 01/03/19 0227 01/03/19 0237  NA 140 141  --  139 141  K 4.4 4.2  --  3.7 4.6  CL 106  --   --   --  110  CO2 13*  --   --   --  17*  GLUCOSE 182*  --   --   --  156*  BUN 29*  --   --   --  23*  CREATININE 1.34*  --  1.10*  --  0.94  CALCIUM 9.8  --   --   --  8.6*  MG  --   --   --   --  1.8  PHOS  --   --   --   --  3.4   GFR: Estimated Creatinine Clearance: 60.1 mL/min (by C-G formula based on SCr of 0.94 mg/dL). Recent Labs  Lab 01/02/19 1938 01/02/19 2332 01/03/19 0037 01/03/19 0237 01/03/19 0605  PROCALCITON  --   --  <0.10  --   --   WBC 15.2*  --  22.3* 16.3*  --   LATICACIDVEN 7.9* 4.0*  --  2.9* 3.4*    Liver Function Tests: Recent Labs  Lab 01/02/19 1938  AST 44*  ALT 18  ALKPHOS 111  BILITOT 0.5  PROT 8.0  ALBUMIN 3.8   Recent Labs  Lab 01/02/19 1938  LIPASE 30   No results for input(s): AMMONIA in the last 168 hours.  ABG    Component Value Date/Time   PHART 7.415 01/03/2019 0227   PCO2ART 31.9 (L) 01/03/2019 0227   PO2ART 188.0 (H) 01/03/2019 0227   HCO3 19.9 (L) 01/03/2019 0227   TCO2 21 (L) 01/03/2019 0227   ACIDBASEDEF 2.0 01/03/2019 0227   O2SAT 100.0 01/03/2019 0227     Coagulation Profile: No results for input(s): INR, PROTIME in the last 168 hours.  Cardiac Enzymes: Recent Labs  Lab 01/02/19 1938  CKTOTAL 952*  TROPONINI 0.98*    HbA1C: Hgb A1c MFr Bld  Date/Time Value Ref Range Status  07/14/2018 06:58 AM 5.9 (H) 4.8 - 5.6 % Final    Comment:    (NOTE) Pre diabetes:          5.7%-6.4% Diabetes:              >6.4% Glycemic control for   <7.0% adults with diabetes   03/03/2017 03:51 AM 5.5 4.8 - 5.6 % Final    Comment:    (NOTE)  Pre-diabetes: 5.7 - 6.4         Diabetes: >6.4         Glycemic control for adults with diabetes: <7.0     CBG: Recent Labs  Lab 01/02/19 1936  GLUCAP 168*       The patient is critically ill with multiple organ systems failure and requires high complexity  decision making for assessment and support, frequent evaluation and titration of therapies, application of advanced monitoring technologies and extensive interpretation of multiple databases. Critical Care Time devoted to patient care services described in this note is 45 minutes.    Christinia Gully, MD Pulmonary and [place] [phone] After 5:30 PM or weekends, use [facility] [phone]

## 2019-01-03 NOTE — Progress Notes (Signed)
Initial Nutrition Assessment RD working remotely.  DOCUMENTATION CODES:   Not applicable  INTERVENTION:   Initiate Vital 1.5 @ 25 ml/hr increase by 10 ml every 8 hours to goal rate of 55 ml/hr via OG tube 30 ml Prostat daily  Provides: 2080 kcal, 104 grams protein, and 1008 ml free water.   Monitor magnesium, potassium, and phosphorus daily for at least 3 days, MD to replete as needed, as pt is at risk for refeeding syndrome given hx of polysubstance abuse.   NUTRITION DIAGNOSIS:   Inadequate oral intake related to inability to eat as evidenced by NPO status.  GOAL:   Patient will meet greater than or equal to 90% of their needs  MONITOR:   Vent status, I & O's  REASON FOR ASSESSMENT:   Consult Assessment of nutrition requirement/status, Enteral/tube feeding initiation and management  ASSESSMENT:   Pt with PMH of depression, ETOH abuse, GERD, HTN, HLD, stroke admitted with severe HTN and new R MCA stroke after being found down in a crack [place].     Spoke with RN, propofol off and not needed at present.  MD ok with starting TF.   Patient is currently intubated on ventilator support MV: 13 L/min Temp (24hrs), Avg:102.2 F (39 C), Min:100 F (37.8 C), Max:103.9 F (39.9 C)  Propofol: off Medications reviewed and include: folic acid, MVI, thiamine  Labs reviewed:  TG: 254 (H) Lactic acid: 3.4 (H)    NUTRITION - FOCUSED PHYSICAL EXAM:  Deferred   Diet Order:   Diet Order            Diet NPO time specified  Diet effective now              EDUCATION NEEDS:   No education needs have been identified at this time  Skin:  Skin Assessment: Reviewed RN Assessment  Last BM:  unknown  Height:   Ht Readings from Last 1 Encounters:  01/03/19 5\' 5"  (1.651 m)    Weight:   Wt Readings from Last 1 Encounters:  01/03/19 65.6 kg    Ideal Body Weight:  56.8 kg  BMI:  Body mass index is 24.07 kg/m.  Estimated Nutritional Needs:   Kcal:   2055  Protein:  85-100 grams   Fluid:  >1.9 L/day   Maylon Peppers RD, LDN, CNSC [phone] Pager [phone] After Hours Pager

## 2019-01-03 NOTE — Progress Notes (Signed)
LTM EEG started/ new electrodes were placed. Notified neuro. No skin breakdown

## 2019-01-03 NOTE — Progress Notes (Signed)
[place] Progress Note Patient Name: Kelsey Wade DOB: 07/21/1961 MRN: [id number]   Date of Service  01/03/2019  HPI/Events of Note  Multiple issues: 1. Sinus Tachycardia - HR = 135. Likely related to fever and 2. Needs f/u Lactic Acid levels. AST elevated and Creatinine = 1.1 Will avoid Tylenol and Motrin.   eICU Interventions  Will order: 1. Ice packs and cooling blanket. 2. Bolus with 0.9 NaCl 1 liter IV over  1 hour now.  3. Lactic Acid level at 2:30 AM and Q 3 hours X 3.      Intervention Category Major Interventions: Arrhythmia - evaluation and management  Leah Skora [place] 01/03/2019, 2:13 AM

## 2019-01-03 NOTE — Progress Notes (Addendum)
Subjective: No further witnessed seizures.  Exam: Vitals:   01/03/19 1953 01/03/19 2000  BP: (!) 165/92 (!) 158/96  Pulse: 97 (!) 109  Resp: 18 (!) 23  Temp: (!) 100.4 F (38 C) (!) 100.4 F (38 C)  SpO2: 100% 100%   Gen: In bed, intubated Resp: Ventilated Abd: soft, nt  Neuro: MS: Opens eyes to noxious stimulation, does not follow commands. CN: Does not blink threat, pupils are reactive bilaterally Motor: Moves her right side more than her left to noxious stimulation Sensory: As above   Impression: 58 year old female presenting with seizure, prolonged period of lethargy/encephalopathy prior to presentation.  She had significant elevation in blood pressure on arrival.  Her CT scan is unclear, possibly representing infarct, but I think that further evaluation is needed.  If MRI is not suggestive of a cause, then could consider lumbar puncture.  Recommendations: 1) MRI brain 2) EEG 3) continue Keppra for now  Roland Rack, MD [facility] [phone]  If 7pm- 7am, please page neurology on call as listed in [place].  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  I will order as needed labetalol for blood pressure greater than 150.  Roland Rack, MD [facility] [phone]  If 7pm- 7am, please page neurology on call as listed in [place].

## 2019-01-04 DIAGNOSIS — I6783 Posterior reversible encephalopathy syndrome: Principal | ICD-10-CM

## 2019-01-04 LAB — GLUCOSE, CAPILLARY
Glucose-Capillary: 155 mg/dL — ABNORMAL HIGH (ref 70–99)
Glucose-Capillary: 136 mg/dL — ABNORMAL HIGH (ref 70–99)
Glucose-Capillary: 113 mg/dL — ABNORMAL HIGH (ref 70–99)
Glucose-Capillary: 107 mg/dL — ABNORMAL HIGH (ref 70–99)
Glucose-Capillary: 117 mg/dL — ABNORMAL HIGH (ref 70–99)
Glucose-Capillary: 97 mg/dL (ref 70–99)

## 2019-01-04 LAB — CBC
HCT: 38.9 % (ref 36.0–46.0)
Hemoglobin: 12.1 g/dL (ref 12.0–15.0)
MCH: 27.2 pg (ref 26.0–34.0)
MCHC: 31.1 g/dL (ref 30.0–36.0)
MCV: 87.4 fL (ref 80.0–100.0)
Platelets: 249 10*3/uL (ref 150–400)
RBC: 4.45 MIL/uL (ref 3.87–5.11)
RDW: 14.6 % (ref 11.5–15.5)
WBC: 10.6 10*3/uL — ABNORMAL HIGH (ref 4.0–10.5)
nRBC: 0 % (ref 0.0–0.2)

## 2019-01-04 LAB — BASIC METABOLIC PANEL
Anion gap: 7 (ref 5–15)
BUN: 21 mg/dL — ABNORMAL HIGH (ref 6–20)
CO2: 22 mmol/L (ref 22–32)
Calcium: 8.2 mg/dL — ABNORMAL LOW (ref 8.9–10.3)
Chloride: 112 mmol/L — ABNORMAL HIGH (ref 98–111)
Creatinine, Ser: 0.64 mg/dL (ref 0.44–1.00)
GFR calc Af Amer: >60 mL/min (ref >60)
GFR calc non Af Amer: >60 mL/min (ref >60)
Glucose, Bld: 124 mg/dL — ABNORMAL HIGH (ref 70–99)
Potassium: 3.3 mmol/L — ABNORMAL LOW (ref 3.5–5.1)
Sodium: 141 mmol/L (ref 135–145)

## 2019-01-04 LAB — URINE CULTURE: Culture: NO GROWTH

## 2019-01-04 LAB — MAGNESIUM
Magnesium: 1.9 mg/dL (ref 1.7–2.4)
Magnesium: 2 mg/dL (ref 1.7–2.4)

## 2019-01-04 LAB — TRIGLYCERIDES: Triglycerides: 185 mg/dL — ABNORMAL HIGH (ref <150)

## 2019-01-04 LAB — LACTIC ACID, PLASMA
Lactic Acid, Venous: 1.6 mmol/L (ref 0.5–1.9)
Lactic Acid, Venous: 1.5 mmol/L (ref 0.5–1.9)

## 2019-01-04 LAB — TROPONIN I: Troponin I: 0.19 ng/mL (ref <0.03)

## 2019-01-04 LAB — PHOSPHORUS
Phosphorus: 2.6 mg/dL (ref 2.5–4.6)
Phosphorus: 3.2 mg/dL (ref 2.5–4.6)

## 2019-01-04 LAB — PROCALCITONIN: Procalcitonin: <0.1 ng/mL

## 2019-01-04 MED ORDER — POTASSIUM CHLORIDE 10 MEQ/100ML IV SOLN
10.0000 meq | INTRAVENOUS | Status: AC
  Administered 2019-01-04 (×2): 10 meq via INTRAVENOUS
  Filled 2019-01-04 (×2): qty 100

## 2019-01-04 MED ORDER — ALBUTEROL SULFATE (2.5 MG/3ML) 0.083% IN NEBU: 2.5000 mg | INHALATION_SOLUTION | Freq: Four times a day (QID) | RESPIRATORY_TRACT | Status: DC | PRN

## 2019-01-04 MED ORDER — LEVETIRACETAM IN NACL 1500 MG/100ML IV SOLN
1500.0000 mg | Freq: Two times a day (BID) | INTRAVENOUS | Status: DC
  Administered 2019-01-05 – 2019-01-07 (×5): 1500 mg via INTRAVENOUS
  Filled 2019-01-04 (×6): qty 100

## 2019-01-04 NOTE — Progress Notes (Signed)
NAME:  Genevra Orne MRN:  [id number] DOB:  [birth year] LOS: 1 ADMISSION DATE:  01/02/2019 DATE OF SERVICE:  01/02/2019  CHIEF COMPLAINT:  Altered mental status   HISTORY & PHYSICAL  History of Present Illness  This 58 y.o. Caucasian female presented to the [facility] Emergency Department via EMS with complaints of unresponsive mental status.  The family reports that her last known well time was over 24 hours PTA.  She was reportedly responsive to noxious stimuli only. She was found to be hypertensive and tachycardic. In the ER, she was found to be hypoxic despite 3 LPM via [place].  She failed to improve with NRB and was intubated in the ER.    She has a known history of substance abuse and stroke/ drug screen neg   Procedures:  04/14: intubated in ER  Significant Diagnostic Tests:   Head CT 4/14: 1. Stable chronic left ICA occlusion. 2. Stable right proximal ICA moderate 50% stenosis with fibrofatty plaque. 3. Stable left common carotid artery origin 50% stenosis with fibrofatty plaque. 4. 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 5. Stable severe right vertebral artery origin stenosis. 6. Edema within the posterior soft tissues of neck and scalp, probably related to being on floor for long duration.  MR Brain 4/15: Extensive T2 signal abnormality throughout the right greater than left cerebral hemispheres and bilateral cerebellum affecting white matter greater than gray matter. Considerations include atypical posterior reversible encephalopathy syndrome, infectious encephalitis, and toxic leukoencephalopathy    Micro studies: MRSA screen  4/15 > neg  Covid-19 screen 01/02/2019 > neg  BC x  2  01/03/2019 >>> 1/5 growing methicillin sensitive CoNS, likely contaminant UC 4/15 >>>  ET 4/15 >>> mod wbc, rare gpc   Antimicrobials:  Vanc 4/14 only  Cefepime/vancomycin/flagyl  4/14 >>>  Interim  history/subjective:  Sedated on propofol. On minimal vent settings  Objective   BP 114/71   Pulse (!) 101   Temp 99.7 F (37.6 C)   Resp (!) 27   Ht 5\' 5"  (1.651 m)   Wt 64.8 kg   SpO2 100%   BMI 23.77 kg/m     Filed Weights   01/02/19 1931 01/03/19 0110 01/04/19 0216  Weight: 62 kg 65.6 kg 64.8 kg    Intake/Output Summary (Last 24 hours) at 01/04/2019 1016 Last data filed at 01/04/2019 0800 Gross per 24 hour  Intake 1392.31 ml  Output 750 ml  Net 642.31 ml    Vent Mode: PSV;CPAP FiO2 (%):  [40 %] 40 % Set Rate:  [18 bmp] 18 bmp Vt Set:  [460 mL] 460 mL PEEP:  [5 cmH20] 5 cmH20 Pressure Support:  [10 cmH20] 10 cmH20 Plateau Pressure:  [12 cmH20-23 cmH20] 12 cmH20   Physical Exam: General: Chronically ill-appearing, no acute distress HENT: , AT, ETT in place Eyes: EOMI, no scleral icterus Respiratory: Clear to auscultation bilaterally.  No crackles, wheezing or rales Cardiovascular: RRR, -M/R/G, no JVD GI: BS+, soft, nontender Extremities:-Edema,-tenderness Neuro: Sedated GU: Foley in place  Resolved Hospital Problem list   AKI  Assessment & Plan:   Acute hypoxemic respiratory failure secondary to PRES Hold on extubation due to mental status Plan: Full vent support PAD protocol Continue cefepime PRN albuterol  Metabolic acidosis secondary to lactic acidosis Trend LA Trend BMP  Acute encephalopathy secondary atypical PRES, subclinical seizures EEG abnormal with some brief subclinical seizures from right anterior region with resolution overnight. Plan: Neurology following  Keppra and Depacon Thiamine/folate   Elevated troponin Likely demand ischemia. EKG on 4/14 without evidence of ischemia Plan: Trend troponin  HTN  PRN labetolol Consider restarting home meds if elevated again  Best practice:  Diet: TF Pain/Anxiety/Delirium protocol (if indicated): propofol VAP protocol (if indicated): Yes DVT prophylaxis: SCD, heparin GI prophylaxis:  Protonix Glucose control: Accuchecks Mobility/Activity: Bedrest CODE STATUS:   Code Status: Full Code Family Communication:  no family at the bedside Disposition: ICU   Labs   CBC: Recent Labs  Lab 01/02/19 1938 01/02/19 2133 01/03/19 0037 01/03/19 0227 01/03/19 0237  WBC 15.2*  --  22.3*  --  16.3*  NEUTROABS 12.9*  --   --   --   --   HGB 16.7* 16.0* 16.2* 15.0 15.8*  HCT 54.7* 47.0* 51.5* 44.0 50.4*  MCV 89.4  --  89.4  --  88.4  PLT 454*  --  435*  --  027    Basic Metabolic Panel: Recent Labs  Lab 01/02/19 1938 01/02/19 2133 01/03/19 0037 01/03/19 0227 01/03/19 0237 01/04/19 0426  NA 140 141  --  139 141  --   K 4.4 4.2  --  3.7 4.6  --   CL 106  --   --   --  110  --   CO2 13*  --   --   --  17*  --   GLUCOSE 182*  --   --   --  156*  --   BUN 29*  --   --   --  23*  --   CREATININE 1.34*  --  1.10*  --  0.94  --   CALCIUM 9.8  --   --   --  8.6*  --   MG  --   --   --   --  1.8 1.9  PHOS  --   --   --   --  3.4 2.6   GFR: Estimated Creatinine Clearance: 60.1 mL/min (by C-G formula based on SCr of 0.94 mg/dL). Recent Labs  Lab 01/02/19 1938 01/02/19 2332 01/03/19 0037 01/03/19 0237 01/03/19 0605 01/04/19 0426  PROCALCITON  --   --  <0.10  --   --  <0.10  WBC 15.2*  --  22.3* 16.3*  --   --   LATICACIDVEN 7.9* 4.0*  --  2.9* 3.4*  --     Liver Function Tests: Recent Labs  Lab 01/02/19 1938  AST 44*  ALT 18  ALKPHOS 111  BILITOT 0.5  PROT 8.0  ALBUMIN 3.8   Recent Labs  Lab 01/02/19 1938  LIPASE 30   No results for input(s): AMMONIA in the last 168 hours.  ABG    Component Value Date/Time   PHART 7.415 01/03/2019 0227   PCO2ART 31.9 (L) 01/03/2019 0227   PO2ART 188.0 (H) 01/03/2019 0227   HCO3 19.9 (L) 01/03/2019 0227   TCO2 21 (L) 01/03/2019 0227   ACIDBASEDEF 2.0 01/03/2019 0227   O2SAT 100.0 01/03/2019 0227     Coagulation Profile: No results for input(s): INR, PROTIME in the last 168 hours.  Cardiac Enzymes: Recent  Labs  Lab 01/02/19 1938  CKTOTAL 952*  TROPONINI 0.98*    HbA1C: Hgb A1c MFr Bld  Date/Time Value Ref Range Status  07/14/2018 06:58 AM 5.9 (H) 4.8 - 5.6 % Final    Comment:    (NOTE) Pre diabetes:          5.7%-6.4% Diabetes:              >  6.4% Glycemic control for   <7.0% adults with diabetes   03/03/2017 03:51 AM 5.5 4.8 - 5.6 % Final    Comment:    (NOTE)         Pre-diabetes: 5.7 - 6.4         Diabetes: >6.4         Glycemic control for adults with diabetes: <7.0     CBG: Recent Labs  Lab 01/03/19 1533 01/03/19 1955 01/03/19 2331 01/04/19 0434 01/04/19 0756  GLUCAP 124* 118* 117* 155* 136*   The patient is critically ill with multiple organ systems failure and requires high complexity decision making for assessment and support, frequent evaluation and titration of therapies, application of advanced monitoring technologies and extensive interpretation of multiple databases.   Critical Care Time devoted to patient care services described in this note is 34 Minutes. This time reflects time of care of this signee Dr. Rodman Pickle. This critical care time does not reflect procedure time, or teaching time or supervisory time of PA/NP/Med student/Med Resident etc but could involve care discussion time.  Rodman Pickle, M.D. [facility] Pulmonary/Critical Care Medicine Pager: [phone] After hours pager: [phone]

## 2019-01-04 NOTE — Procedures (Signed)
LTM-EEG Report  HISTORY: Continuous video-EEG monitoring performed for 57 year old with encephalopathy.  ACQUISITION: International 10-20 system for electrode placement; 18 channels with additional eyes linked to ipsilateral ears and EKG. Additional T1-T2 electrodes were used. Continuous video recording obtained.   EEG NUMBER:  MEDICATIONS:  Day 1: see EMR   DAY #1: from 1502 01/03/19 to 0730 01/04/19   BACKGROUND: An overall medium voltage continuous recording with some spontaneous variability and reactivity. The waking background consisted of medium voltage 3-6 Hz activity bilaterally without a posterior dominant rhythm. State changes were seen with sleep comprising the majority of the record. There was decreased waking background overnight, possibly due to increased sedation.  EPILEPTIFORM/PERIODIC ACTIVITY: There were several runs initially during the record with right anterior rhythmic slowing or sharp periodic discharges with 2hz  frequency. These resolved overnight with increased sedation. Some atypical arousal patterns with increased slowing were seen in the morning hours with waking.  SEIZURES: Possible subclinical seizures from the right anterior regions initially, resolved EVENTS: none reported  EKG: no significant arrhythmia  SUMMARY: This was an abnormal continuous video EEG due to diffuse slowing and some brief, subclinical seizures initially arising from the right anterior region. These resolved overnight.

## 2019-01-04 NOTE — Progress Notes (Signed)
maint complete - no skin breakdown noted.

## 2019-01-04 NOTE — Progress Notes (Signed)
Subjective: Slightly improved this morning  Exam: Vitals:   01/04/19 1800 01/04/19 1830  BP: (!) 122/92 116/77  Pulse: (!) 117 (!) 115  Resp: (!) 24 20  Temp: (!) 100.8 F (38.2 C) (!) 100.6 F (38.1 C)  SpO2: 100% 100%   Gen: In bed, NAD Resp: non-labored breathing, no acute distress Abd: soft, nt  Neuro: MS: Awakens to mild stimuli, does not follow commands but does awaken more and engage the examiner more than yesterday. CN: She does not clearly blink to threat from the right, but does from the left she does not cross midline to the right Motor: She moves her left side more today than yesterday, still weak on the left, but with some withdrawal Sensory: She responds to noxious stimuli in all 4 extremities  Impression: 58 year old female with posterior reversible encephalopathy likely due to hypertension on arrival, plus or minus drugs of abuse that were not present on UDS.  She did have seizures on EEG, but these improved with treatment.  Recommendations: 1) continue LTM for now 2) continue Keppra at 1500 twice daily 3) continue Depakote 500 twice daily, level tomorrow 4) continue BP control.  This patient is critically ill and at significant risk of neurological worsening, death and care requires constant monitoring of vital signs, hemodynamics,respiratory and cardiac monitoring, neurological assessment, discussion with family, other specialists and medical decision making of high complexity. I spent 45 minutes of neurocritical care time  in the care of  this patient. This was time spent independent of any time provided by nurse practitioner or PA.  Roland Rack, MD [facility] [phone]  If 7pm- 7am, please page neurology on call as listed in [place]. 01/04/2019  7:21 PM

## 2019-01-05 LAB — GLUCOSE, CAPILLARY
Glucose-Capillary: 109 mg/dL — ABNORMAL HIGH (ref 70–99)
Glucose-Capillary: 116 mg/dL — ABNORMAL HIGH (ref 70–99)
Glucose-Capillary: 116 mg/dL — ABNORMAL HIGH (ref 70–99)
Glucose-Capillary: 117 mg/dL — ABNORMAL HIGH (ref 70–99)
Glucose-Capillary: 139 mg/dL — ABNORMAL HIGH (ref 70–99)
Glucose-Capillary: 97 mg/dL (ref 70–99)

## 2019-01-05 LAB — PHOSPHORUS
Phosphorus: 3.3 mg/dL (ref 2.5–4.6)
Phosphorus: 2.7 mg/dL (ref 2.5–4.6)

## 2019-01-05 LAB — VALPROIC ACID LEVEL: Valproic Acid Lvl: 35 ug/mL — ABNORMAL LOW (ref 50.0–100.0)

## 2019-01-05 LAB — CULTURE, BLOOD (ROUTINE X 2): Special Requests: ADEQUATE

## 2019-01-05 LAB — BASIC METABOLIC PANEL
Anion gap: 10 (ref 5–15)
BUN: 17 mg/dL (ref 6–20)
CO2: 23 mmol/L (ref 22–32)
Calcium: 8.4 mg/dL — ABNORMAL LOW (ref 8.9–10.3)
Chloride: 109 mmol/L (ref 98–111)
Creatinine, Ser: 0.68 mg/dL (ref 0.44–1.00)
GFR calc Af Amer: >60 mL/min (ref >60)
GFR calc non Af Amer: >60 mL/min (ref >60)
Glucose, Bld: 130 mg/dL — ABNORMAL HIGH (ref 70–99)
Potassium: 3.5 mmol/L (ref 3.5–5.1)
Sodium: 142 mmol/L (ref 135–145)

## 2019-01-05 LAB — CULTURE, RESPIRATORY W GRAM STAIN: Culture: NORMAL

## 2019-01-05 LAB — MAGNESIUM
Magnesium: 2.1 mg/dL (ref 1.7–2.4)
Magnesium: 2 mg/dL (ref 1.7–2.4)

## 2019-01-05 LAB — TRIGLYCERIDES: Triglycerides: 68 mg/dL (ref <150)

## 2019-01-05 LAB — PROCALCITONIN: Procalcitonin: <0.1 ng/mL

## 2019-01-05 LAB — CULTURE, RESPIRATORY

## 2019-01-05 MED ORDER — AMLODIPINE BESYLATE 10 MG PO TABS
10.0000 mg | ORAL_TABLET | Freq: Every day | ORAL | Status: DC
  Administered 2019-01-06 – 2019-01-08 (×3): 10 mg via ORAL
  Filled 2019-01-05 (×3): qty 1

## 2019-01-05 MED ORDER — HYDRALAZINE HCL 20 MG/ML IJ SOLN
10.0000 mg | INTRAMUSCULAR | Status: DC | PRN
  Administered 2019-01-05 – 2019-01-08 (×5): 10 mg via INTRAVENOUS
  Filled 2019-01-05 (×6): qty 1

## 2019-01-05 MED ORDER — ORAL CARE MOUTH RINSE
15.0000 mL | Freq: Two times a day (BID) | OROMUCOSAL | Status: DC
  Administered 2019-01-05 – 2019-01-08 (×6): 15 mL via OROMUCOSAL

## 2019-01-05 MED ORDER — SODIUM CHLORIDE 0.9 % IV SOLN
2.0000 g | Freq: Three times a day (TID) | INTRAVENOUS | Status: DC
  Administered 2019-01-05 – 2019-01-08 (×10): 2 g via INTRAVENOUS
  Filled 2019-01-05 (×14): qty 2

## 2019-01-05 NOTE — Progress Notes (Signed)
NAME:  Kelsey Wade MRN:  [id number] DOB:  Nov 06, 1960 LOS: 2 ADMISSION DATE:  01/02/2019 DATE OF SERVICE:  01/02/2019  CHIEF COMPLAINT:  Altered mental status   HISTORY & PHYSICAL  History of Present Illness  This 58 y.o. Caucasian female presented to the [facility] Emergency Department via EMS with complaints of unresponsive mental status.  The family reports that her last known well time was over 24 hours PTA.  She was reportedly responsive to noxious stimuli only. She was found to be hypertensive and tachycardic. In the ER, she was found to be hypoxic despite 3 LPM via [place].  She failed to improve with NRB and was intubated in the ER.    She has a known history of substance abuse and stroke/ drug screen neg   Procedures:  04/14: intubated in ER  Significant Diagnostic Tests:   Head CT 4/14: 1. Stable chronic left ICA occlusion. 2. Stable right proximal ICA moderate 50% stenosis with fibrofatty plaque. 3. Stable left common carotid artery origin 50% stenosis with fibrofatty plaque. 4. 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 5. Stable severe right vertebral artery origin stenosis. 6. Edema within the posterior soft tissues of neck and scalp, probably related to being on floor for long duration.  MR Brain 4/15: Extensive T2 signal abnormality throughout the right greater than left cerebral hemispheres and bilateral cerebellum affecting white matter greater than gray matter. Considerations include atypical posterior reversible encephalopathy syndrome, infectious encephalitis, and toxic leukoencephalopathy    Micro studies: MRSA screen  4/15 > neg  Covid-19 screen 01/02/2019 > neg  BC x  2  01/03/2019 >>> 1/5 growing methicillin sensitive CoNS, likely contaminant UC 4/15 >>>  ET 4/15 >>> mod wbc, rare gpc   Antimicrobials:  Vanc 4/14 only  Cefepime/vancomycin/flagyl  4/14 >>>  Interim  history/subjective:  Sedated on propofol. On minimal vent settings  Objective   BP (!) 162/91   Pulse 87   Temp 98.6 F (37 C)   Resp 15   Ht 5\' 5"  (1.651 m)   Wt 68.4 kg   SpO2 100%   BMI 25.09 kg/m     Filed Weights   01/03/19 0110 01/04/19 0216 01/05/19 0500  Weight: 65.6 kg 64.8 kg 68.4 kg    Intake/Output Summary (Last 24 hours) at 01/05/2019 1049 Last data filed at 01/05/2019 0700 Gross per 24 hour  Intake 3313.43 ml  Output 735 ml  Net 2578.43 ml    Vent Mode: PSV;CPAP FiO2 (%):  [40 %] 40 % Set Rate:  [18 bmp] 18 bmp Vt Set:  [460 mL] 460 mL PEEP:  [5 cmH20] 5 cmH20 Pressure Support:  [5 cmH20] 5 cmH20 Plateau Pressure:  [13 cmH20-16 cmH20] 14 cmH20   Physical Exam: General: Chronically ill-appearing, no acute distress HENT: [place], AT, ETT in place Eyes: EOMI, no scleral icterus Respiratory: Clear to auscultation bilaterally.  No crackles, wheezing or rales Cardiovascular: RRR, no r/m GI: BS+, soft, nontender Extremities:-Edema,-tenderness Neuro: min propofol running, but CN III-XII grossly intact. Pt follows commands, MAE on request GU: Foley in place  Resolved Hospital Problem list   AKI  Assessment & Plan:   Acute hypoxemic respiratory failure secondary to PRES Hold on extubation due to mental status Plan: Consider extubation today;  PAD protocol Continue cefepime PRN albuterol   Metabolic acidosis secondary to lactic acidosis Trend LA Trend BMP  Acute encephalopathy secondary atypical PRES, subclinical seizures EEG abnormal with some  brief subclinical seizures from right anterior region with resolution overnight. Plan: Neurology following Keppra and Depacon Thiamine/folate   Elevated troponin Likely demand ischemia. EKG on 4/14 without evidence of ischemia Plan: Trend troponin  HTN  PRN labetolol Consider restarting home meds if elevated again; no indication of antihypertensive on home med list  Best practice:  Diet: TF  Pain/Anxiety/Delirium protocol (if indicated): propofol VAP protocol (if indicated): Yes DVT prophylaxis: SCD, heparin GI prophylaxis: Protonix Glucose control: Accuchecks Mobility/Activity: Bedrest CODE STATUS:   Code Status: Full Code Family Communication:  no family at the bedside Disposition: ICU   Labs   CBC: Recent Labs  Lab 01/02/19 1938 01/02/19 2133 01/03/19 0037 01/03/19 0227 01/03/19 0237 01/04/19 1112  WBC 15.2*  --  22.3*  --  16.3* 10.6*  NEUTROABS 12.9*  --   --   --   --   --   HGB 16.7* 16.0* 16.2* 15.0 15.8* 12.1  HCT 54.7* 47.0* 51.5* 44.0 50.4* 38.9  MCV 89.4  --  89.4  --  88.4 87.4  PLT 454*  --  435*  --  331 902    Basic Metabolic Panel: Recent Labs  Lab 01/02/19 1938 01/02/19 2133 01/03/19 0037 01/03/19 0227 01/03/19 0237 01/04/19 0426 01/04/19 1112 01/04/19 1639 01/05/19 0656  NA 140 141  --  139 141  --  141  --  142  K 4.4 4.2  --  3.7 4.6  --  3.3*  --  3.5  CL 106  --   --   --  110  --  112*  --  109  CO2 13*  --   --   --  17*  --  22  --  23  GLUCOSE 182*  --   --   --  156*  --  124*  --  130*  BUN 29*  --   --   --  23*  --  21*  --  17  CREATININE 1.34*  --  1.10*  --  0.94  --  0.64  --  0.68  CALCIUM 9.8  --   --   --  8.6*  --  8.2*  --  8.4*  MG  --   --   --   --  1.8 1.9  --  2.0 2.1  PHOS  --   --   --   --  3.4 2.6  --  3.2 3.3   GFR: Estimated Creatinine Clearance: 76.4 mL/min (by C-G formula based on SCr of 0.68 mg/dL). Recent Labs  Lab 01/02/19 1938  01/03/19 0037 01/03/19 0237 01/03/19 0605 01/04/19 0426 01/04/19 1112 01/04/19 1402  PROCALCITON  --   --  <0.10  --   --  <0.10  --   --   WBC 15.2*  --  22.3* 16.3*  --   --  10.6*  --   LATICACIDVEN 7.9*   < >  --  2.9* 3.4*  --  1.6 1.5   < > = values in this interval not displayed.    Liver Function Tests: Recent Labs  Lab 01/02/19 1938  AST 44*  ALT 18  ALKPHOS 111  BILITOT 0.5  PROT 8.0  ALBUMIN 3.8   Recent Labs  Lab 01/02/19 1938   LIPASE 30   No results for input(s): AMMONIA in the last 168 hours.  ABG    Component Value Date/Time   PHART 7.415 01/03/2019 0227   PCO2ART 31.9 (L) 01/03/2019 4097  PO2ART 188.0 (H) 01/03/2019 0227   HCO3 19.9 (L) 01/03/2019 0227   TCO2 21 (L) 01/03/2019 0227   ACIDBASEDEF 2.0 01/03/2019 0227   O2SAT 100.0 01/03/2019 0227     Coagulation Profile: No results for input(s): INR, PROTIME in the last 168 hours.  Cardiac Enzymes: Recent Labs  Lab 01/02/19 1938 01/04/19 1112  CKTOTAL 952*  --   TROPONINI 0.98* 0.19*    HbA1C: Hgb A1c MFr Bld  Date/Time Value Ref Range Status  07/14/2018 06:58 AM 5.9 (H) 4.8 - 5.6 % Final    Comment:    (NOTE) Pre diabetes:          5.7%-6.4% Diabetes:              >6.4% Glycemic control for   <7.0% adults with diabetes   03/03/2017 03:51 AM 5.5 4.8 - 5.6 % Final    Comment:    (NOTE)         Pre-diabetes: 5.7 - 6.4         Diabetes: >6.4         Glycemic control for adults with diabetes: <7.0     CBG: Recent Labs  Lab 01/04/19 1540 01/04/19 1931 01/04/19 2319 01/05/19 0336 01/05/19 0743  GLUCAP 107* 117* 97 139* 116*   The patient is critically ill with multiple organ systems failure and requires high complexity decision making for assessment and support, frequent evaluation and titration of therapies, application of advanced monitoring technologies and extensive interpretation of multiple databases.   Critical Care Time devoted to patient care services described in this note is 33 Minutes. This time reflects time of care of this signee. This critical care time does not reflect procedure time, or teaching time or supervisory time of PA/NP/Med student/Med Resident etc but could involve care discussion time.  Bonna Gains  After hours pager: [phone]

## 2019-01-05 NOTE — Procedures (Signed)
Extubation Procedure Note  Patient Details:   Name: Kelsey Wade DOB: [birth year] MRN: [id number]   Airway Documentation:    Vent end date: (not recorded) Vent end time: (not recorded)   Evaluation  O2 sats: stable throughout Complications: No apparent complications Patient did tolerate procedure well. Bilateral Breath Sounds: Clear, Diminished   Yes   Patient was extubated to a 3L [place] without any complications, dyspnea or stridor noted. Patient was instructed on IS x 5, highest goal reached was 1,021mL.   Claretta Fraise 01/05/2019, 12:25

## 2019-01-05 NOTE — Procedures (Signed)
LTM-EEG Report  HISTORY: Continuous video-EEG monitoring performed for 58 year old with encephalopathy.  ACQUISITION: International 10-20 system for electrode placement; 18 channels with additional eyes linked to ipsilateral ears and EKG. Additional T1-T2 electrodes were used. Continuous video recording obtained.   EEG NUMBER:  MEDICATIONS:  Day 2: see EMR   DAY #2: from 0730 01/04/19 to 0730 01/05/19   BACKGROUND: An overall medium voltage continuous recording with some spontaneous variability and reactivity. The waking background consisted of medium voltage 3-6 Hz activity bilaterally with emergence of a 7-8hz  posterior dominant rhythm. State changes were seen with some stage II sleep architecture.  EPILEPTIFORM/PERIODIC ACTIVITY: There were rare, brief runs of right anterior rhythmic slowing, resolving over the course of the record.  SEIZURES: none  EVENTS: none reported  EKG: no significant arrhythmia  SUMMARY: This was an abnormal continuous video EEG due to diffuse slowing maximal over the right. No further seizures were seen.

## 2019-01-05 NOTE — Progress Notes (Signed)
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  She did have seizures on EEG, but these improved with treatment.  Recommendations: 1) continue LTM for now 2) continue Keppra at 1500 twice daily 3) continue Depakote 500 twice daily, level tomorrow 4) continue BP control, will restart home amlodipine    Roland Rack, MD [facility] [phone]  If 7pm- 7am, please page neurology on call as listed in [place]. 01/05/2019  8:27 PM

## 2019-01-05 NOTE — Progress Notes (Addendum)
Pharmacy Antibiotic Note  Kelsey Wade is a 58 y.o. female admitted on 01/02/2019 with possible  pneumonia.  Pharmacy has been consulted for cefepime dosing - day #3 on restart. SCr improved to 0.68. Tmax/24h 100.9 WBC trend down to 10.6.  Plan: Cefepime to 2g IV q8h with CrCl now >60 Monitor clinical progress, c/s, renal function F/u de-escalation plan/LOT   Height: 5\' 5"  (165.1 cm) Weight: 150 lb 12.7 oz (68.4 kg) IBW/kg (Calculated) : 57  Temp (24hrs), Avg:99.8 F (37.7 C), Min:98.2 F (36.8 C), Max:100.9 F (38.3 C)  Recent Labs  Lab 01/02/19 1938 01/02/19 2332 01/03/19 0037 01/03/19 0237 01/03/19 0605 01/04/19 1112 01/04/19 1402 01/05/19 0656  WBC 15.2*  --  22.3* 16.3*  --  10.6*  --   --   CREATININE 1.34*  --  1.10* 0.94  --  0.64  --  0.68  LATICACIDVEN 7.9* 4.0*  --  2.9* 3.4* 1.6 1.5  --     Estimated Creatinine Clearance: 76.4 mL/min (by C-G formula based on SCr of 0.68 mg/dL).    No Known Allergies  Antimicrobials this admission: 4/14 cefepime x 1; 4/15>> 4/14 vanc x 1 4/14 flagyl x 1; 4/15>>4/16  Microbiology results: 4/14 covid - neg 4/14 bcx - staph epi 1/2 4/15 bcx - ngtd 4/15 mrsa pcr - neg 4/15 TA - nf 4/15 UC - neg  Elicia Lamp, PharmD, BCPS Please check AMION for all [place] contact numbers Clinical Pharmacist 01/05/2019 10:10 AM

## 2019-01-05 NOTE — Progress Notes (Signed)
EEG LTM Complete. No skin breakdown

## 2019-01-06 LAB — BASIC METABOLIC PANEL
Anion gap: 10 (ref 5–15)
BUN: 13 mg/dL (ref 6–20)
CO2: 21 mmol/L — ABNORMAL LOW (ref 22–32)
Calcium: 8.4 mg/dL — ABNORMAL LOW (ref 8.9–10.3)
Chloride: 108 mmol/L (ref 98–111)
Creatinine, Ser: 0.55 mg/dL (ref 0.44–1.00)
GFR calc Af Amer: >60 mL/min (ref >60)
GFR calc non Af Amer: >60 mL/min (ref >60)
Glucose, Bld: 127 mg/dL — ABNORMAL HIGH (ref 70–99)
Potassium: 3.3 mmol/L — ABNORMAL LOW (ref 3.5–5.1)
Sodium: 139 mmol/L (ref 135–145)

## 2019-01-06 LAB — GLUCOSE, CAPILLARY
Glucose-Capillary: 114 mg/dL — ABNORMAL HIGH (ref 70–99)
Glucose-Capillary: 115 mg/dL — ABNORMAL HIGH (ref 70–99)
Glucose-Capillary: 147 mg/dL — ABNORMAL HIGH (ref 70–99)
Glucose-Capillary: 108 mg/dL — ABNORMAL HIGH (ref 70–99)

## 2019-01-06 LAB — TRIGLYCERIDES: Triglycerides: 148 mg/dL (ref <150)

## 2019-01-06 MED ORDER — ADULT MULTIVITAMIN W/MINERALS CH
1.0000 | ORAL_TABLET | Freq: Every day | ORAL | Status: DC
  Administered 2019-01-06 – 2019-01-08 (×3): 1 via ORAL
  Filled 2019-01-06 (×3): qty 1

## 2019-01-06 MED ORDER — VITAMIN B-1 100 MG PO TABS
100.0000 mg | ORAL_TABLET | Freq: Every day | ORAL | Status: DC
  Administered 2019-01-06 – 2019-01-08 (×3): 100 mg via ORAL
  Filled 2019-01-06 (×3): qty 1

## 2019-01-06 MED ORDER — FOLIC ACID 1 MG PO TABS
1.0000 mg | ORAL_TABLET | Freq: Every day | ORAL | Status: DC
  Administered 2019-01-06 – 2019-01-08 (×3): 1 mg via ORAL
  Filled 2019-01-06 (×3): qty 1

## 2019-01-06 MED ORDER — FENTANYL CITRATE (PF) 100 MCG/2ML IJ SOLN
25.0000 ug | INTRAMUSCULAR | Status: DC | PRN
  Administered 2019-01-06: 50 ug via INTRAVENOUS
  Administered 2019-01-06 (×2): 75 ug via INTRAVENOUS
  Administered 2019-01-06: 50 ug via INTRAVENOUS
  Administered 2019-01-06 (×2): 75 ug via INTRAVENOUS
  Administered 2019-01-06 – 2019-01-08 (×5): 50 ug via INTRAVENOUS
  Administered 2019-01-08: 75 ug via INTRAVENOUS
  Filled 2019-01-06 (×13): qty 2

## 2019-01-06 NOTE — Progress Notes (Addendum)
NAME:  Kelsey Wade MRN:  [id number] DOB:  06/25/1961 LOS: 3 ADMISSION DATE:  01/02/2019 DATE OF SERVICE:  01/02/2019  CHIEF COMPLAINT:  Altered mental status   HISTORY & PHYSICAL  History of Present Illness  This 58 y.o. Caucasian female presented to the [facility] Emergency Department via EMS with complaints of unresponsive mental status.  The family reports that her last known well time was over 24 hours PTA.  She was reportedly responsive to noxious stimuli only. She was found to be hypertensive and tachycardic. In the ER, she was found to be hypoxic despite 3 LPM via [place].  She failed to improve with NRB and was intubated in the ER.    She has a known history of substance abuse and stroke/ drug screen neg   Procedures:  04/14: intubated in ER  Significant Diagnostic Tests:   Head CT 4/14: 1. Stable chronic left ICA occlusion. 2. Stable right proximal ICA moderate 50% stenosis with fibrofatty plaque. 3. Stable left common carotid artery origin 50% stenosis with fibrofatty plaque. 4. 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 5. Stable severe right vertebral artery origin stenosis. 6. Edema within the posterior soft tissues of neck and scalp, probably related to being on floor for long duration.  MR Brain 4/15: Extensive T2 signal abnormality throughout the right greater than left cerebral hemispheres and bilateral cerebellum affecting white matter greater than gray matter. Considerations include atypical posterior reversible encephalopathy syndrome, infectious encephalitis, and toxic leukoencephalopathy    Micro studies: MRSA screen  4/15 > neg  Covid-19 screen 01/02/2019 > neg  BC x  2  01/03/2019 >>> 1/5 growing methicillin sensitive CoNS, likely contaminant UC 4/15 >>>  ET 4/15 >>> mod wbc, rare gpc   Antimicrobials:  Vanc 4/14 only  Cefepime/vancomycin/flagyl  4/14 >>>  Interim  history/subjective:  Extubated yesterday. Mental status significantly improved. No O2 requirement. PT to see this morning  Objective   BP (!) 122/103   Pulse (!) 108   Temp 98.6 F (37 C) (Oral)   Resp (!) 21   Ht 5\' 5"  (1.651 m)   Wt 65.3 kg   SpO2 97%   BMI 23.96 kg/m     Filed Weights   01/04/19 0216 01/05/19 0500 01/06/19 0500  Weight: 64.8 kg 68.4 kg 65.3 kg    Intake/Output Summary (Last 24 hours) at 01/06/2019 1043 Last data filed at 01/06/2019 0800 Gross per 24 hour  Intake 2496.64 ml  Output 850 ml  Net 1646.64 ml    Vent Mode: PSV;CPAP FiO2 (%):  [40 %] 40 % PEEP:  [5 cmH20] 5 cmH20 Pressure Support:  [5 cmH20] 5 cmH20   Physical Exam: General: Chronically ill-appearing, no acute distress HENT: [place], AT, Eyes: EOMI,  Respiratory: Clear to auscultation bilaterally.  No crackles, wheezing or rales Cardiovascular: RRR, no r/m GI: BS+, soft, nontender Extremities:-Edema,-tenderness Neuro: CN III-XII grossly intact. Pt follows commands, MAE on request, but seems to almost have neglect of right side until explicity asked. R arm/hand contracted today; patient reports that is baseline since stroke GU: UOP adequate  Resolved Hospital Problem list   AKI  Assessment & Plan:   Acute hypoxemic respiratory failure secondary to PRES Extubated yesterday, now on room air, sats stable Plan:  PAD protocol Continue cefepime PRN albuterol   Metabolic acidosis secondary to lactic acidosis Trend LA Trend BMP  Acute encephalopathy secondary atypical PRES, subclinical seizures EEG abnormal with some brief  subclinical seizures from right anterior region with resolution overnight. Plan: Neurology following Keppra and Depacon Thiamine/folate   Elevated troponin Likely demand ischemia. EKG on 4/14 without evidence of ischemia Plan: Trend troponin  HTN  PRN labetolol Consider restarting home meds if elevated again; no indication of antihypertensive on home med list   Best practice:  Diet: TF Pain/Anxiety/Delirium protocol (if indicated): n/a VAP protocol (if indicated): Yes DVT prophylaxis: SCD, heparin GI prophylaxis: Protonix Glucose control: Accuchecks Mobility/Activity: PT to see today and eval; OOB to chair with help/safety CODE STATUS:   Code Status: Full Code Family Communication:  no family at the bedside Disposition: to floor today   Labs   CBC: Recent Labs  Lab 01/02/19 1938 01/02/19 2133 01/03/19 0037 01/03/19 0227 01/03/19 0237 01/04/19 1112  WBC 15.2*  --  22.3*  --  16.3* 10.6*  NEUTROABS 12.9*  --   --   --   --   --   HGB 16.7* 16.0* 16.2* 15.0 15.8* 12.1  HCT 54.7* 47.0* 51.5* 44.0 50.4* 38.9  MCV 89.4  --  89.4  --  88.4 87.4  PLT 454*  --  435*  --  331 086    Basic Metabolic Panel: Recent Labs  Lab 01/02/19 1938  01/03/19 0037 01/03/19 0227 01/03/19 0237 01/04/19 0426 01/04/19 1112 01/04/19 1639 01/05/19 0656 01/05/19 1658 01/06/19 0419  NA 140   < >  --  139 141  --  141  --  142  --  139  K 4.4   < >  --  3.7 4.6  --  3.3*  --  3.5  --  3.3*  CL 106  --   --   --  110  --  112*  --  109  --  108  CO2 13*  --   --   --  17*  --  22  --  23  --  21*  GLUCOSE 182*  --   --   --  156*  --  124*  --  130*  --  127*  BUN 29*  --   --   --  23*  --  21*  --  17  --  13  CREATININE 1.34*  --  1.10*  --  0.94  --  0.64  --  0.68  --  0.55  CALCIUM 9.8  --   --   --  8.6*  --  8.2*  --  8.4*  --  8.4*  MG  --   --   --   --  1.8 1.9  --  2.0 2.1 2.0  --   PHOS  --   --   --   --  3.4 2.6  --  3.2 3.3 2.7  --    < > = values in this interval not displayed.   GFR: Estimated Creatinine Clearance: 70.7 mL/min (by C-G formula based on SCr of 0.55 mg/dL). Recent Labs  Lab 01/02/19 1938  01/03/19 0037 01/03/19 0237 01/03/19 0605 01/04/19 0426 01/04/19 1112 01/04/19 1402 01/05/19 0656  PROCALCITON  --   --  <0.10  --   --  <0.10  --   --  <0.10  WBC 15.2*  --  22.3* 16.3*  --   --  10.6*  --   --    LATICACIDVEN 7.9*   < >  --  2.9* 3.4*  --  1.6 1.5  --    < > =  values in this interval not displayed.    Liver Function Tests: Recent Labs  Lab 01/02/19 1938  AST 44*  ALT 18  ALKPHOS 111  BILITOT 0.5  PROT 8.0  ALBUMIN 3.8   Recent Labs  Lab 01/02/19 1938  LIPASE 30   No results for input(s): AMMONIA in the last 168 hours.  ABG    Component Value Date/Time   PHART 7.415 01/03/2019 0227   PCO2ART 31.9 (L) 01/03/2019 0227   PO2ART 188.0 (H) 01/03/2019 0227   HCO3 19.9 (L) 01/03/2019 0227   TCO2 21 (L) 01/03/2019 0227   ACIDBASEDEF 2.0 01/03/2019 0227   O2SAT 100.0 01/03/2019 0227     Coagulation Profile: No results for input(s): INR, PROTIME in the last 168 hours.  Cardiac Enzymes: Recent Labs  Lab 01/02/19 1938 01/04/19 1112  CKTOTAL 952*  --   TROPONINI 0.98* 0.19*    HbA1C: Hgb A1c MFr Bld  Date/Time Value Ref Range Status  07/14/2018 06:58 AM 5.9 (H) 4.8 - 5.6 % Final    Comment:    (NOTE) Pre diabetes:          5.7%-6.4% Diabetes:              >6.4% Glycemic control for   <7.0% adults with diabetes   03/03/2017 03:51 AM 5.5 4.8 - 5.6 % Final    Comment:    (NOTE)         Pre-diabetes: 5.7 - 6.4         Diabetes: >6.4         Glycemic control for adults with diabetes: <7.0     CBG: Recent Labs  Lab 01/05/19 1524 01/05/19 1925 01/05/19 2333 01/06/19 0313 01/06/19 0755  GLUCAP 116* 117* 109* 114* 115*   The patient presented as critically ill with multiple organ systems failure and requires high complexity decision making for assessment and support, frequent evaluation and titration of therapies, application of advanced monitoring technologies and extensive interpretation of multiple databases.   Critical Care Time devoted to patient care services described in this note is 31 Minutes. This time reflects time of care of this signee. This critical care time does not reflect procedure time, or teaching time or supervisory time of  PA/NP/Med student/Med Resident etc but could involve care discussion time.  Bonna Gains  After hours pager: [phone]

## 2019-01-06 NOTE — Evaluation (Signed)
Clinical/Bedside Swallow Evaluation Patient Details  Name: Kelsey Wade MRN: [id number] Date of Birth: [birth year]  Today's Date: 01/06/2019 Time: SLP Start Time (ACUTE ONLY): 25 SLP Stop Time (ACUTE ONLY): 1155 SLP Time Calculation (min) (ACUTE ONLY): 25 min  Past Medical History:  Past Medical History:  Diagnosis Date  . Anxiety   . Arthritis   . Chronic back pain   . Depression   . ETOH abuse   . GERD (gastroesophageal reflux disease)   . HTN (hypertension)   . Hyperlipidemia   . Stroke [facility])    June 2018  . Tobacco abuse    Past Surgical History:  Past Surgical History:  Procedure Laterality Date  . APPENDECTOMY     HPI:  58 y.o. female presented via EMS with complaints of unresponsive mental status in setting of acute encephalopathy secondary atypical PRES, subclinical seizures. PMH includes cerebral edema, HTN, and Stroke   Assessment / Plan / Recommendation Clinical Impression  Patient presents with a mild oropharyngeal dysphagia without any overt s/s of aspiration or penetration observed. Patient exhibited mild delay in mastication and swallow initiation with regular solids but only top dentures are present and she is edentuolous. Patient did not exhibit any s/s of aspiration or penetration with thin liquids with straw sips and voice remained clear throughout.  SLP Visit Diagnosis: Dysphagia, unspecified (R13.10)    Aspiration Risk  Mild aspiration risk    Diet Recommendation Regular;Thin liquid   Liquid Administration via: Cup;Straw Medication Administration: Whole meds with puree Supervision: Patient able to self feed;Intermittent supervision to cue for compensatory strategies Compensations: Minimize environmental distractions;Slow rate;Small sips/bites Postural Changes: Seated upright at 90 degrees    Other  Recommendations Oral Care Recommendations: Oral care BID   Follow up Recommendations None      Frequency and Duration min 1 x/week  1 week        Prognosis Prognosis for Safe Diet Advancement: Good      Swallow Study   General Date of Onset: 01/03/19 HPI: 58 y.o. female presented via EMS with complaints of unresponsive mental status in setting of acute encephalopathy secondary atypical PRES, subclinical seizures. PMH includes cerebral edema, HTN, and Stroke Type of Study: Bedside Swallow Evaluation Previous Swallow Assessment: N/A Diet Prior to this Study: Thin liquids Temperature Spikes Noted: No History of Recent Intubation: Yes Length of Intubations (days): 4 days Date extubated: 01/05/19 Behavior/Cognition: Alert;Pleasant mood;Cooperative;Requires cueing Oral Cavity Assessment: Within Functional Limits Oral Care Completed by SLP: Yes Oral Cavity - Dentition: Dentures, top;Other (Comment)(edentuolous and no dentures bottom) Vision: Functional for self-feeding Self-Feeding Abilities: Able to feed self Patient Positioning: Upright in bed Baseline Vocal Quality: Normal Volitional Cough: Strong Volitional Swallow: Able to elicit    Oral/Motor/Sensory Function Overall Oral Motor/Sensory Function: Mild impairment Facial ROM: Reduced left Facial Symmetry: Within Functional Limits Facial Strength: Reduced right Facial Sensation: Within Functional Limits Lingual ROM: Within Functional Limits Lingual Symmetry: Within Functional Limits Lingual Strength: Within Functional Limits   Ice Chips     Thin Liquid Thin Liquid: Within functional limits Presentation: Cup;Straw Pharyngeal  Phase Impairments: Suspected delayed Swallow Other Comments: no overt s/s of aspiration or penetration    Nectar Thick     Honey Thick     Puree Puree: Within functional limits Presentation: Self Fed   Solid     Solid: Impaired Oral Phase Impairments: Impaired mastication Pharyngeal Phase Impairments: Suspected delayed Swallow      Dannial Monarch 01/06/2019,6:20 PM   Sonia Baller, MA, CCC-SLP  Speech Therapy [facility] Acute  Rehab Pager: [phone]

## 2019-01-06 NOTE — Progress Notes (Signed)
Arrived from 4N. Alert and oriented to person, time, and situation. Generalized pain 7/10. Call light within reach.

## 2019-01-06 NOTE — Evaluation (Signed)
Physical Therapy Evaluation Patient Details Name: Kelsey Wade MRN: [id number] DOB: Dec 16, 1960 Today's Date: 01/06/2019   History of Present Illness  58 y.o. female presented via EMS with complaints of unresponsive mental status in setting of acute encephalopathy secondary atypical PRES, subclinical seizures. PMH includes cerebral edema, HTN, and Stroke  Clinical Impression  Orders received for PT evaluation. Patient demonstrates deficits in functional mobility as indicated below. Will benefit from continued skilled PT to address deficits and maximize function. Will see as indicated and progress as tolerated.  At this time, patient with cognitive and functional deficits impeding mobility and self care. Feel patient will need post acute rehabilitation to improve function and maximize recovery.    Follow Up Recommendations CIR    Equipment Recommendations  (TBD)    Recommendations for Other Services Rehab consult     Precautions / Restrictions Precautions Precautions: Fall      Mobility  Bed Mobility Overal bed mobility: Needs Assistance Bed Mobility: Rolling;Sidelying to Sit Rolling: Min assist Sidelying to sit: Mod assist       General bed mobility comments: moderate assist to elevate trunk and scoot to EOB. increased time and effort to perform  Transfers Overall transfer level: Needs assistance Equipment used: 1 person hand held assist Transfers: Sit to/from Stand Sit to Stand: Min assist         General transfer comment: min assist to power to upright and maintain stabiility  Ambulation/Gait Ambulation/Gait assistance: Mod assist;+2 safety/equipment Gait Distance (Feet): 18 Feet Assistive device: 1 person hand held assist Gait Pattern/deviations: Ataxic;Drifts right/left;Staggering right;Narrow base of support;Trunk flexed;Shuffle;Step-through pattern;Decreased stride length Gait velocity: decreased Gait velocity interpretation: <1.31 ft/sec, indicative of  household ambulator General Gait Details: patient with difficulty initiating motor movement and sequencing, moderate physical assist for short distance ambulation with noted freezing bouts requiring therapist over cues and rocking to faiclitate step/stride.  Stairs            Wheelchair Mobility    Modified Rankin (Stroke Patients Only) Modified Rankin (Stroke Patients Only) Pre-Morbid Rankin Score: Moderate disability Modified Rankin: Moderately severe disability     Balance Overall balance assessment: Needs assistance   Sitting balance-Leahy Scale: Fair Sitting balance - Comments: able to sit EOB self supported    Standing balance support: During functional activity;Single extremity supported Standing balance-Leahy Scale: Poor Standing balance comment: heavy relaince on external support and assist to prevent fall                             Pertinent Vitals/Pain Pain Assessment: No/denies pain    Home Living Family/patient expects to be discharged to:: Private residence   Available Help at Discharge: Family;Available 24 hours/day Type of Home: House Home Access: Stairs to enter Entrance Stairs-Rails: Can reach both Entrance Stairs-Number of Steps: 2 Home Layout: One level Home Equipment: Walker - 2 wheels;Cane - single point Additional Comments: information obtained from previous chart review prior admission - pt with new cognitive deficits (expressive and receptive difficulties) - no family/caregivers present to provide reliable information.    Prior Function Level of Independence: Independent with assistive device(s)         Comments: Pt reports yes to RW and SPC, no family/caregivers present during eval to provide reliable information     Hand Dominance   Dominant Hand: Left    Extremity/Trunk Assessment   Upper Extremity Assessment Upper Extremity Assessment: RUE deficits/detail RUE Deficits / Details: residual deficits in RUE from  prior  stroke, evidence of flexor synergy and contracture    Lower Extremity Assessment Lower Extremity Assessment: RLE deficits/detail RLE Deficits / Details: noted assymetrical weakness RLE but functioanl RLE Sensation: decreased proprioception RLE Coordination: decreased fine motor;decreased gross motor       Communication   Communication: Receptive difficulties;Expressive difficulties  Cognition Arousal/Alertness: Awake/alert Behavior During Therapy: Flat affect Overall Cognitive Status: No family/caregiver present to determine baseline cognitive functioning Area of Impairment: Awareness;Safety/judgement;Problem solving;Following commands;Memory;Attention                   Current Attention Level: Selective Memory: Decreased short-term memory Following Commands: Follows one step commands inconsistently Safety/Judgement: Decreased awareness of safety;Decreased awareness of deficits Awareness: Emergent Problem Solving: Slow processing;Decreased initiation;Requires verbal cues;Requires tactile cues;Difficulty sequencing        General Comments      Exercises     Assessment/Plan    PT Assessment Patient needs continued PT services  PT Problem List Decreased strength;Decreased activity tolerance;Decreased balance;Decreased mobility;Decreased coordination;Decreased safety awareness;Decreased cognition       PT Treatment Interventions DME instruction;Gait training;Stair training;Functional mobility training;Therapeutic activities;Therapeutic exercise;Balance training;Neuromuscular re-education;Cognitive remediation;Patient/family education    PT Goals (Current goals can be found in the Care Plan section)  Acute Rehab PT Goals Patient Stated Goal: to get out of bed PT Goal Formulation: With patient Time For Goal Achievement: 01/20/19 Potential to Achieve Goals: Good    Frequency Min 4X/week   Barriers to discharge        Co-evaluation               AM-PAC  PT "6 Clicks" Mobility  Outcome Measure Help needed turning from your back to your side while in a flat bed without using bedrails?: A Little Help needed moving from lying on your back to sitting on the side of a flat bed without using bedrails?: A Little Help needed moving to and from a bed to a chair (including a wheelchair)?: A Lot Help needed standing up from a chair using your arms (e.g., wheelchair or bedside chair)?: A Lot Help needed to walk in hospital room?: A Lot Help needed climbing 3-5 steps with a railing? : A Lot 6 Click Score: 14    End of Session Equipment Utilized During Treatment: Gait belt Activity Tolerance: Patient tolerated treatment well;Patient limited by fatigue Patient left: in chair;with call bell/phone within reach;with chair alarm set Nurse Communication: Mobility status PT Visit Diagnosis: Unsteadiness on feet (R26.81);Difficulty in walking, not elsewhere classified (R26.2);Other symptoms and signs involving the nervous system (R29.898)    Time: 7903-8333 PT Time Calculation (min) (ACUTE ONLY): 20 min   Charges:   PT Evaluation $PT Eval Moderate Complexity: 1 Mod          Alben Deeds, PT DPT  Board Certified Neurologic Specialist Acute Rehabilitation Services Pager [phone] Office [place] 01/06/2019, 1:56 PM

## 2019-01-06 NOTE — Progress Notes (Signed)
Rehab Admissions Coordinator Note:  Patient was screened by Cleatrice Burke for appropriateness for an Inpatient Acute Rehab Consult per PT recs. .  At this time, we are recommending Inpatient Rehab consult. Please place order for consult.  Cleatrice Burke RN MSN 01/06/2019, 2:18 PM  I can be reached at [phone].

## 2019-01-06 NOTE — Progress Notes (Signed)
Subjective: Continues to improve  Exam: Vitals:   01/06/19 2016 01/06/19 2025  BP: (!) 159/100 130/79  Pulse:  (!) 104  Resp:  17  Temp:    SpO2:  95%   Gen: In bed, NAD Resp: non-labored breathing, no acute distress Abd: soft, nt  Neuro: MS: Awake, alert, she does have some motor perseveration, but she is able to answer questions about her previous stroke. CN: She does not clearly blink to threat from the right, but does from the left pupils are reactive bilaterally Motor: She has increased tone in her right arm which she states is chronic since her previous stroke, her left hemiparesis is greatly improved. 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  She did have seizures on EEG, but these improved with treatment.  Recommendations: 1) continue Keppra at 1500 twice daily 2) continue Depakote 500 twice daily, level tomorrow 3) continue BP control as this is the primary treatment of PRES.  4) will follow.      Roland Rack, MD [facility] [phone]  If 7pm- 7am, please page neurology on call as listed in [place]. 01/06/2019  8:40 PM

## 2019-01-07 DIAGNOSIS — I6783 Posterior reversible encephalopathy syndrome: Secondary | ICD-10-CM | POA: Diagnosis present

## 2019-01-07 LAB — CULTURE, BLOOD (ROUTINE X 2): Culture: NO GROWTH

## 2019-01-07 LAB — BASIC METABOLIC PANEL
Anion gap: 12 (ref 5–15)
BUN: 11 mg/dL (ref 6–20)
CO2: 20 mmol/L — ABNORMAL LOW (ref 22–32)
Calcium: 8.3 mg/dL — ABNORMAL LOW (ref 8.9–10.3)
Chloride: 109 mmol/L (ref 98–111)
Creatinine, Ser: 0.56 mg/dL (ref 0.44–1.00)
GFR calc Af Amer: >60 mL/min (ref >60)
GFR calc non Af Amer: >60 mL/min (ref >60)
Glucose, Bld: 108 mg/dL — ABNORMAL HIGH (ref 70–99)
Potassium: 3.2 mmol/L — ABNORMAL LOW (ref 3.5–5.1)
Sodium: 141 mmol/L (ref 135–145)

## 2019-01-07 LAB — VALPROIC ACID LEVEL: Valproic Acid Lvl: 39 ug/mL — ABNORMAL LOW (ref 50.0–100.0)

## 2019-01-07 MED ORDER — DIVALPROEX SODIUM 250 MG PO DR TAB
750.0000 mg | DELAYED_RELEASE_TABLET | Freq: Two times a day (BID) | ORAL | Status: DC
  Administered 2019-01-07 – 2019-01-08 (×2): 750 mg via ORAL
  Filled 2019-01-07 (×2): qty 3

## 2019-01-07 MED ORDER — VENLAFAXINE HCL 37.5 MG PO TABS
75.0000 mg | ORAL_TABLET | Freq: Two times a day (BID) | ORAL | Status: DC
  Administered 2019-01-07 – 2019-01-08 (×2): 75 mg via ORAL
  Filled 2019-01-07 (×2): qty 2

## 2019-01-07 MED ORDER — ATORVASTATIN CALCIUM 40 MG PO TABS
40.0000 mg | ORAL_TABLET | Freq: Every day | ORAL | Status: DC
  Administered 2019-01-07: 19:00:00 40 mg via ORAL
  Filled 2019-01-07: qty 1

## 2019-01-07 MED ORDER — HYDROXYZINE HCL 25 MG PO TABS: 25.0000 mg | ORAL_TABLET | Freq: Three times a day (TID) | ORAL | Status: DC | PRN

## 2019-01-07 MED ORDER — CARVEDILOL 3.125 MG PO TABS
3.1250 mg | ORAL_TABLET | Freq: Two times a day (BID) | ORAL | Status: DC
  Administered 2019-01-07 – 2019-01-08 (×3): 3.125 mg via ORAL
  Filled 2019-01-07 (×3): qty 1

## 2019-01-07 MED ORDER — PANTOPRAZOLE SODIUM 40 MG PO TBEC
40.0000 mg | DELAYED_RELEASE_TABLET | Freq: Every day | ORAL | Status: DC
  Administered 2019-01-07: 40 mg via ORAL
  Filled 2019-01-07: qty 1

## 2019-01-07 MED ORDER — LEVETIRACETAM 750 MG PO TABS
1500.0000 mg | ORAL_TABLET | Freq: Two times a day (BID) | ORAL | Status: DC
  Administered 2019-01-07 – 2019-01-08 (×3): 1500 mg via ORAL
  Filled 2019-01-07 (×3): qty 2

## 2019-01-07 MED ORDER — VALPROATE SODIUM 500 MG/5ML IV SOLN
1000.0000 mg | Freq: Once | INTRAVENOUS | Status: AC
  Administered 2019-01-07: 1000 mg via INTRAVENOUS
  Filled 2019-01-07: qty 10

## 2019-01-07 MED ORDER — CLOPIDOGREL BISULFATE 75 MG PO TABS
75.0000 mg | ORAL_TABLET | Freq: Every day | ORAL | Status: DC
  Administered 2019-01-07 – 2019-01-08 (×2): 75 mg via ORAL
  Filled 2019-01-07 (×2): qty 1

## 2019-01-07 NOTE — Progress Notes (Signed)
Subjective: Continues to improve  Exam: Vitals:   01/06/19 2248 01/07/19 0733  BP: (!) 141/93 (!) 156/84  Pulse: (!) 113 (!) 102  Resp: 17 16  Temp: 98.5 F (36.9 C) 98.3 F (36.8 C)  SpO2: 99% 99%   Gen: In bed, NAD Resp: non-labored breathing, no acute distress Abd: soft, nt  Neuro: MS: Awake, alert, she has improving motor perseveration.  For instance today, when asked her how she is doing she reaches out to shake my hand and says "fine".  She then answers another question and I asked her to do something and instead of performing the action requested she reaches out her hand and says "fine."  Most of my interaction with her today is appropriate, however. CN: She has full visual fields, full extraocular movements Motor: She has increased tone in her right arm and leg which she states is chronic from her previous stroke, her left hemiparesis is markedly improved. 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  She did have seizures on EEG, but these improved with treatment.  I would favor continuing Depakote in addition to [place] for another week or so, then could discontinue Depakote and continue on Keppra monotherapy.  Recommendations: 1) continue Keppra at 1500 twice daily 2) increase depakote to 750mg  BID, can DC in 1 week 3) continue BP control as this is the primary treatment of PRES.  4) I would favor repeating MRI in a couple of days. 5) PT,OT     Roland Rack, MD [facility] [phone]  If 7pm- 7am, please page neurology on call as listed in [place]. 01/07/2019  10:15 AM

## 2019-01-07 NOTE — Progress Notes (Signed)
Patient Demographics:    Kelsey Wade, is a 58 y.o. female, DOB - 1961/02/17, XNA:[id number]  Admit date - 01/02/2019   Admitting Physician Renee Pain, MD  Outpatient Primary MD for the patient is Elenore Paddy, [place]  LOS - 4   Chief Complaint  Patient presents with   Altered Mental Status        Subjective:    Kelsey Wade today has no fevers, no emesis,  No chest pain, no headaches  Assessment  & Plan :    Principal Problem:   PRES (posterior reversible encephalopathy syndrome) Active Problems:   HTN (hypertension)   Cerebral edema (HCC)   Elevated troponin   Acute hypoxemic respiratory failure [facility])   Brief Summary 58 year old past medical history relevant for hypertension admitted on 01/02/2019 with unresponsive episode and acute hypoxia and emergently intubated on 01/02/2019, extubated 01/05/2019, neuro consult appreciated patient most likely had press syndrome, COVID-19 is negative   A/P  1)HTN/PRES syndrome--BP control improving, Coreg 3.125 mg twice daily added to amlodipine 10 mg daily, neurologist recommends repeating MRI in a couple days  2)HLD--okay to continue Lipitor 40 mg daily and  Plavix  3)Depression/anxiety--- stable, continue Effexor 75 mg twice daily, use prn hydroxyzine  4)Seizure--neurology input appreciated, continue Keppra 1500 mg twice daily, Depakote level is 39, continue Depakote 750 mg twice daily for 1 week and then stop the Depakote,  5) generalized weakness/debility--- PT eval appreciated recommend inpatient rehab  Disposition/Need for in-Hospital Stay- patient unable to be discharged at this time due to awaiting inpatient rehab  Code Status :  Full   Family Communication:   na  Procedures Intubated 01/02/2019,  extubated 01/05/2019  Disposition Plan  : CIR  Consults  :  Neuro/PCCM  DVT Prophylaxis  :  Heparin -   Lab Results    Component Value Date   PLT 249 01/04/2019    Inpatient Medications  Scheduled Meds:  amLODipine  10 mg Oral Daily   carvedilol  3.125 mg Oral BID WC   chlorhexidine gluconate (MEDLINE KIT)  15 mL Mouth Rinse BID   divalproex  750 mg Oral Q12H   feeding supplement (PRO-STAT SUGAR FREE 64)  30 mL Per Tube Daily   folic acid  1 mg Oral Daily   heparin  5,000 Units Subcutaneous Q8H   levETIRAcetam  1,500 mg Oral BID   mouth rinse  15 mL Mouth Rinse BID   multivitamin with minerals  1 tablet Oral Daily   pantoprazole  40 mg Oral QHS   thiamine  100 mg Oral Daily   Continuous Infusions:  ceFEPime (MAXIPIME) IV 2 g (01/07/19 1443)   dextrose 5 % and 0.45% NaCl 30 mL/hr at 01/07/19 1441   PRN Meds:.albuterol, fentaNYL (SUBLIMAZE) injection, hydrALAZINE, labetalol    Anti-infectives (From admission, onward)   Start     Dose/Rate Route Frequency Ordered Stop   01/05/19 1100  ceFEPIme (MAXIPIME) 2 g in sodium chloride 0.9 % 100 mL IVPB     2 g 200 mL/hr over 30 Minutes Intravenous Every 8 hours 01/05/19 1013     01/03/19 1115  metroNIDAZOLE (FLAGYL) IVPB 500 mg  Status:  Discontinued     500 mg 100 mL/hr over 60 Minutes Intravenous Every 8  hours 01/03/19 1101 01/04/19 1028   01/03/19 1115  ceFEPIme (MAXIPIME) 2 g in sodium chloride 0.9 % 100 mL IVPB  Status:  Discontinued     2 g 200 mL/hr over 30 Minutes Intravenous Every 12 hours 01/03/19 1101 01/05/19 1013   01/02/19 1945  ceFEPIme (MAXIPIME) 2 g in sodium chloride 0.9 % 100 mL IVPB     2 g 200 mL/hr over 30 Minutes Intravenous  Once 01/02/19 1940 01/02/19 2119   01/02/19 1945  metroNIDAZOLE (FLAGYL) IVPB 500 mg     500 mg 100 mL/hr over 60 Minutes Intravenous  Once 01/02/19 1940 01/02/19 2300   01/02/19 1945  vancomycin (VANCOCIN) IVPB 1000 mg/200 mL premix     1,000 mg 200 mL/hr over 60 Minutes Intravenous  Once 01/02/19 1940 01/03/19 0018        Objective:   Vitals:   01/06/19 2209 01/06/19 2248  01/07/19 0733 01/07/19 1156  BP: 132/83 (!) 141/93 (!) 156/84 (!) 136/93  Pulse: (!) 117 (!) 113 (!) 102 96  Resp:  '17 16 20  '$ Temp:  98.5 F (36.9 C) 98.3 F (36.8 C) 98.2 F (36.8 C)  TempSrc:  Oral Oral Oral  SpO2: 94% 99% 99% 98%  Weight:      Height:        Wt Readings from Last 3 Encounters:  01/06/19 65.3 kg  10/10/18 62.1 kg  09/22/18 62.1 kg     Intake/Output Summary (Last 24 hours) at 01/07/2019 1445 Last data filed at 01/07/2019 2248 Gross per 24 hour  Intake 942.39 ml  Output 1900 ml  Net -957.61 ml     Physical Exam Patient is examined daily including today on 01/07/19 , exams remain the same as of yesterday except that has changed   Gen:- Awake Alert,  In no apparent distress  HEENT:- [place].AT, No sclera icterus Neck-Supple Neck,No JVD,.  Lungs-  CTAB , fair symmetrical air movement CV- S1, S2 normal, regular  Abd-  +ve B.Sounds, Abd Soft, No tenderness,    Extremity/Skin:- No  edema, pedal pulses present  Psych-affect is appropriate, oriented x3 Neuro-generalized weakness, unsteady gait concerns no new focal deficits, no tremors   Data Review:   Micro Results Recent Results (from the past 240 hour(s))  SARS Coronavirus 2 [facility] order, Performed in [place] hospital lab)     Status: None   Collection Time: 01/02/19  7:40 PM  Result Value Ref Range Status   SARS Coronavirus 2 NEGATIVE NEGATIVE Final    Comment: (NOTE) If result is NEGATIVE SARS-CoV-2 target nucleic acids are NOT DETECTED. The SARS-CoV-2 RNA is generally detectable in upper and lower  respiratory specimens during the acute phase of infection. The lowest  concentration of SARS-CoV-2 viral copies this assay can detect is 250  copies / mL. A negative result does not preclude SARS-CoV-2 infection  and should not be used as the sole basis for treatment or other  patient management decisions.  A negative result may occur with  improper specimen collection / handling, submission of  specimen other  than nasopharyngeal swab, presence of viral mutation(s) within the  areas targeted by this assay, and inadequate number of viral copies  (<250 copies / mL). A negative result must be combined with clinical  observations, patient history, and epidemiological information. If result is POSITIVE SARS-CoV-2 target nucleic acids are DETECTED. The SARS-CoV-2 RNA is generally detectable in upper and lower  respiratory specimens dur ing the acute phase of infection.  Positive  results are indicative of active infection with SARS-CoV-2.  Clinical  correlation with patient history and other diagnostic information is  necessary to determine patient infection status.  Positive results do  not rule out bacterial infection or co-infection with other viruses. If result is PRESUMPTIVE POSTIVE SARS-CoV-2 nucleic acids MAY BE PRESENT.   A presumptive positive result was obtained on the submitted specimen  and confirmed on repeat testing.  While 2019 novel coronavirus  (SARS-CoV-2) nucleic acids may be present in the submitted sample  additional confirmatory testing may be necessary for epidemiological  and / or clinical management purposes  to differentiate between  SARS-CoV-2 and other Sarbecovirus currently known to infect humans.  If clinically indicated additional testing with an alternate test  methodology [phone]) is advised. The SARS-CoV-2 RNA is generally  detectable in upper and lower respiratory sp ecimens during the acute  phase of infection. The expected result is Negative. Fact Sheet for Patients:  StrictlyIdeas.no Fact Sheet for Healthcare Providers: BankingDealers.co.za This test is not yet approved or cleared by the [place] FDA and has been authorized for detection and/or diagnosis of SARS-CoV-2 by FDA under an Emergency Use Authorization (EUA).  This EUA will remain in effect (meaning this test can be used) for the  duration of the COVID-19 declaration under Section 564(b)(1) of the Act, 21 U.S.C. section 360bbb-3(b)(1), unless the authorization is terminated or revoked sooner. Performed at [facility], [place] [address] [place], [place] [address]   Blood Culture (routine x 2)     Status: Abnormal   Collection Time: 01/02/19  7:43 PM  Result Value Ref Range Status   Specimen Description BLOOD LEFT ARM  Final   Special Requests   Final    BOTTLES DRAWN AEROBIC AND ANAEROBIC Blood Culture adequate volume   Culture  Setup Time   Final    GRAM POSITIVE COCCI IN CLUSTERS IN BOTH AEROBIC AND ANAEROBIC BOTTLES CRITICAL RESULT CALLED TO, READ BACK BY AND VERIFIED WITH: Warrick Parisian PharmD 17:35 01/03/19 ([email or web address])    Culture (A)  Final    STAPHYLOCOCCUS SPECIES (COAGULASE NEGATIVE) THE SIGNIFICANCE OF ISOLATING THIS ORGANISM FROM A SINGLE SET OF BLOOD CULTURES WHEN MULTIPLE SETS ARE DRAWN IS UNCERTAIN. PLEASE NOTIFY THE MICROBIOLOGY DEPARTMENT WITHIN ONE WEEK IF SPECIATION AND SENSITIVITIES ARE REQUIRED. Performed at [facility], [place] [address], [place], [place] [address]    Report Status 01/05/2019 FINAL  Final  Blood Culture ID Panel (Reflexed)     Status: Abnormal   Collection Time: 01/02/19  7:43 PM  Result Value Ref Range Status   Enterococcus species NOT DETECTED NOT DETECTED Final   Listeria monocytogenes NOT DETECTED NOT DETECTED Final   Staphylococcus species DETECTED (A) NOT DETECTED Final    Comment: Methicillin (oxacillin) susceptible coagulase negative staphylococcus. Possible blood culture contaminant (unless isolated from more than one blood culture draw or clinical case suggests pathogenicity). No antibiotic treatment is indicated for blood  culture contaminants. CRITICAL RESULT CALLED TO, READ BACK BY AND VERIFIED WITH: Warrick Parisian PharmD 17:35 01/03/19 ([email or web address])    Staphylococcus aureus (BCID) NOT DETECTED NOT DETECTED Final   Methicillin resistance NOT DETECTED NOT DETECTED Final     Streptococcus species NOT DETECTED NOT DETECTED Final   Streptococcus agalactiae NOT DETECTED NOT DETECTED Final   Streptococcus pneumoniae NOT DETECTED NOT DETECTED Final   Streptococcus pyogenes NOT DETECTED NOT DETECTED Final   Acinetobacter baumannii NOT DETECTED NOT DETECTED Final   Enterobacteriaceae species NOT DETECTED NOT DETECTED Final  Enterobacter cloacae complex NOT DETECTED NOT DETECTED Final   Escherichia coli NOT DETECTED NOT DETECTED Final   Klebsiella oxytoca NOT DETECTED NOT DETECTED Final   Klebsiella pneumoniae NOT DETECTED NOT DETECTED Final   Proteus species NOT DETECTED NOT DETECTED Final   Serratia marcescens NOT DETECTED NOT DETECTED Final   Haemophilus influenzae NOT DETECTED NOT DETECTED Final   Neisseria meningitidis NOT DETECTED NOT DETECTED Final   Pseudomonas aeruginosa NOT DETECTED NOT DETECTED Final   Candida albicans NOT DETECTED NOT DETECTED Final   Candida glabrata NOT DETECTED NOT DETECTED Final   Candida krusei NOT DETECTED NOT DETECTED Final   Candida parapsilosis NOT DETECTED NOT DETECTED Final   Candida tropicalis NOT DETECTED NOT DETECTED Final    Comment: Performed at [facility], [place] [address] [place], [place] [address]  Blood Culture (routine x 2)     Status: None   Collection Time: 01/02/19  8:00 PM  Result Value Ref Range Status   Specimen Description BLOOD LEFT HAND  Final   Special Requests   Final    BOTTLES DRAWN AEROBIC ONLY Blood Culture results may not be optimal due to an inadequate volume of blood received in culture bottles   Culture   Final    NO GROWTH 5 DAYS Performed at [facility], [place] [address], [place], [place] [address]    Report Status 01/07/2019 FINAL  Final  Culture, blood (routine x 2)     Status: None (Preliminary result)   Collection Time: 01/03/19 12:15 AM  Result Value Ref Range Status   Specimen Description BLOOD RIGHT ANTECUBITAL  Final   Special Requests   Final    BOTTLES DRAWN  AEROBIC AND ANAEROBIC Blood Culture results may not be optimal due to an inadequate volume of blood received in culture bottles   Culture   Final    NO GROWTH 4 DAYS Performed at [facility], [place] [address], [place], [place] [address]    Report Status PENDING  Incomplete  Culture, blood (routine x 2)     Status: None (Preliminary result)   Collection Time: 01/03/19 12:37 AM  Result Value Ref Range Status   Specimen Description BLOOD RIGHT HAND  Final   Special Requests   Final    BOTTLES DRAWN AEROBIC AND ANAEROBIC Blood Culture adequate volume   Culture   Final    NO GROWTH 4 DAYS Performed at [facility], [place] [address], [place], [place] [address]    Report Status PENDING  Incomplete  MRSA PCR Screening     Status: None   Collection Time: 01/03/19  2:24 AM  Result Value Ref Range Status   MRSA by PCR NEGATIVE NEGATIVE Final    Comment:        The GeneXpert MRSA Assay (FDA approved for NASAL specimens only), is one component of a comprehensive MRSA colonization surveillance program. It is not intended to diagnose MRSA infection nor to guide or monitor treatment for MRSA infections. Performed at [facility], [place] [address], [place], [place] [address]   Culture, respiratory (tracheal aspirate)     Status: None   Collection Time: 01/03/19  2:27 AM  Result Value Ref Range Status   Specimen Description TRACHEAL ASPIRATE  Final   Special Requests NONE  Final   Gram Stain   Final    MODERATE WBC PRESENT, PREDOMINANTLY PMN RARE GRAM POSITIVE COCCI    Culture   Final  Consistent with normal respiratory flora. Performed at [facility], [place] [address], [place], [place] [address]    Report Status 01/05/2019 FINAL  Final  Urine culture     Status: None   Collection Time: 01/03/19  2:39 AM  Result Value Ref Range Status   Specimen Description URINE, CATHETERIZED  Final   Special Requests NONE  Final   Culture   Final    NO GROWTH Performed at  [facility], [place] [address], [place], [place] [address]    Report Status 01/04/2019 FINAL  Final    Radiology Reports Ct Angio Head W Or Wo Contrast  Result Date: 01/02/2019 CLINICAL DATA:  58 y/o  F; altered mental status. Abnormal CT. EXAM: CT ANGIOGRAPHY HEAD AND NECK TECHNIQUE: Multidetector CT imaging of the head and neck was performed using the standard protocol during bolus administration of intravenous contrast. Multiplanar CT image reconstructions and MIPs were obtained to evaluate the vascular anatomy. Carotid stenosis measurements (when applicable) are obtained utilizing NASCET criteria, using the distal internal carotid diameter as the denominator. CONTRAST:  126m OMNIPAQUE IOHEXOL 350 MG/ML SOLN COMPARISON:  07/14/2018 CT angiogram head and neck. FINDINGS: CTA NECK FINDINGS Aortic arch: Standard branching. Imaged portion shows no evidence of aneurysm or dissection. No significant stenosis of the major arch vessel origins. Aortic atherosclerosis. Right carotid system: Mixed plaque of the carotid bifurcation with proximal ICA moderate 50% stenosis. There are several small plaque ulcerations within the fibrofatty component of the plaque. Left carotid system: 50% stenosis of the left common carotid artery origin with fibrofatty plaque. Chronic left ICA occlusion. 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 Severe right vertebral artery origin stenosis. Skeleton: No acute osseous abnormality. Stable C7, T2, T5 prominent superior endplate Schmorl's nodes. Other neck: There is edema within the soft tissues of the neck and scalp posteriorly. No fluid collection. Upper chest: Negative. Review of the MIP images confirms the above findings CTA HEAD FINDINGS Anterior circulation: Stable occlusion of left internal carotid artery petrous and cavernous segments, patent paraclinoid and terminal segments. Stable moderate left ACA stenosis. No new  large vessel occlusion, aneurysm or vascular malformation. Posterior circulation: Stable mild bilateral proximal PCA stenosis. No large vessel occlusion, aneurysm, or vascular malformation. Bilateral V4 segments are patent. Venous sinuses: As permitted by contrast timing, patent. Anatomic variants: None significant. Delayed phase: No abnormal intracranial enhancement. Review of the MIP images confirms the above findings IMPRESSION: CTA neck: 1. Stable chronic left ICA occlusion. 2. Stable right proximal ICA moderate 50% stenosis with fibrofatty plaque. 3. Stable left common carotid artery origin 50% stenosis with fibrofatty plaque. 4. 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 5. Stable severe right vertebral artery origin stenosis. 6. Edema within the posterior soft tissues of neck and scalp, probably related to being on floor for long duration. CTA head: 1. No new intracranial large vessel occlusion. 2. Stable chronic occlusion of left internal carotid artery petrous and cavernous segments. Patent left paraclinoid and terminal ICA segments. 3. Stable moderate left ACA stenosis versus right dominant system. 4. Stable mild bilateral proximal PCA stenosis. Electronically Signed   By: LKristine GarbeM.D.   On: 01/02/2019 23:24   Ct Head Wo Contrast  Result Date: 01/02/2019 CLINICAL DATA:  Unresponsive and recent fall, initial encounter EXAM: CT HEAD WITHOUT CONTRAST CT CERVICAL SPINE WITHOUT CONTRAST TECHNIQUE: Multidetector CT imaging of the head and cervical spine was performed following the standard  protocol without intravenous contrast. Multiplanar CT image reconstructions of the cervical spine were also generated. COMPARISON:  10/10/2018, 07/14/2018 FINDINGS: CT HEAD FINDINGS Brain: Diffuse edema is noted within the right cerebral hemisphere with sulcal blunting and mild mass effect from right to left of approximately 3 mm. Hyperdense MCA is  noted on the right consistent with thrombosis chronic ischemic changes are again seen in the left cerebellar hemisphere. 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 No acute hemorrhage is seen. Vascular: Hyperdense right MCA is noted. Skull: Skull is intact. Sinuses/Orbits: Paranasal sinuses are within normal limits. The orbits and their contents are unremarkable. Other: Scalp hematoma is noted posteriorly near the vertex. CT CERVICAL SPINE FINDINGS Alignment: Mild straightening of the normal cervical lordosis is noted. Skull base and vertebrae: 7 cervical segments are well visualized. Stable superior endplate deformity is noted at C7 and T2 similar to that noted on a prior exam from 07/14/2018. No acute fracture or acute facet abnormality is noted. The odontoid is within normal limits. Mild facet hypertrophic changes are seen. Soft tissues and spinal canal: Surrounding soft tissues demonstrates some subcutaneous edema along the musculature of the posterior neck on the right this may be related to the recent injury. No focal hematoma is noted. Endotracheal tube and gastric catheter are noted. No other soft tissue abnormality is noted. Upper chest: Mild emphysematous changes are seen. Other: None IMPRESSION: CT of the head: Diffuse edema within the right cerebral hemisphere particularly in the distribution of the right middle cerebral artery consistent with acute ischemia. This is superimposed over chronic ischemic changes and mild midline shift from right to left is noted of approximately 3 mm. No definitive mass lesion is noted. Changes of mild hyperdense right MCA are noted. Chronic atrophic and ischemic changes stable from the previous exam. Posterior scalp hematoma near the vertex. CT of the cervical spine: Degenerative and chronic changes stable from the previous exam. No acute bony abnormality noted. Soft tissue changes in the musculature of the posterior neck on the right likely related to recent fall.  Critical Value/emergent results were called by telephone at the time of interpretation on 01/02/2019 at 8:39 pm to [facility] , who verbally acknowledged these results. Electronically Signed   By: Inez Catalina M.D.   On: 01/02/2019 20:45   Ct Angio Neck W And/or Wo Contrast  Result Date: 01/02/2019 CLINICAL DATA:  58 y/o  F; altered mental status. Abnormal CT. EXAM: CT ANGIOGRAPHY HEAD AND NECK TECHNIQUE: Multidetector CT imaging of the head and neck was performed using the standard protocol during bolus administration of intravenous contrast. Multiplanar CT image reconstructions and MIPs were obtained to evaluate the vascular anatomy. Carotid stenosis measurements (when applicable) are obtained utilizing NASCET criteria, using the distal internal carotid diameter as the denominator. CONTRAST:  147m OMNIPAQUE IOHEXOL 350 MG/ML SOLN COMPARISON:  07/14/2018 CT angiogram head and neck. FINDINGS: CTA NECK FINDINGS Aortic arch: Standard branching. Imaged portion shows no evidence of aneurysm or dissection. No significant stenosis of the major arch vessel origins. Aortic atherosclerosis. Right carotid system: Mixed plaque of the carotid bifurcation with proximal ICA moderate 50% stenosis. There are several small plaque ulcerations within the fibrofatty component of the plaque. Left carotid system: 50% stenosis of the left common carotid artery origin with fibrofatty plaque. Chronic left ICA occlusion. 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 Severe right vertebral artery origin stenosis. Skeleton: No acute osseous abnormality. Stable C7, T2, T5 prominent superior endplate Schmorl's nodes.  Other neck: There is edema within the soft tissues of the neck and scalp posteriorly. No fluid collection. Upper chest: Negative. Review of the MIP images confirms the above findings CTA HEAD FINDINGS Anterior circulation: Stable occlusion of left internal carotid  artery petrous and cavernous segments, patent paraclinoid and terminal segments. Stable moderate left ACA stenosis. No new large vessel occlusion, aneurysm or vascular malformation. Posterior circulation: Stable mild bilateral proximal PCA stenosis. No large vessel occlusion, aneurysm, or vascular malformation. Bilateral V4 segments are patent. Venous sinuses: As permitted by contrast timing, patent. Anatomic variants: None significant. Delayed phase: No abnormal intracranial enhancement. Review of the MIP images confirms the above findings IMPRESSION: CTA neck: 1. Stable chronic left ICA occlusion. 2. Stable right proximal ICA moderate 50% stenosis with fibrofatty plaque. 3. Stable left common carotid artery origin 50% stenosis with fibrofatty plaque. 4. 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 5. Stable severe right vertebral artery origin stenosis. 6. Edema within the posterior soft tissues of neck and scalp, probably related to being on floor for long duration. CTA head: 1. No new intracranial large vessel occlusion. 2. Stable chronic occlusion of left internal carotid artery petrous and cavernous segments. Patent left paraclinoid and terminal ICA segments. 3. Stable moderate left ACA stenosis versus right dominant system. 4. Stable mild bilateral proximal PCA stenosis. Electronically Signed   By: Kristine Garbe M.D.   On: 01/02/2019 23:24   Ct Cervical Spine Wo Contrast  Result Date: 01/02/2019 CLINICAL DATA:  Unresponsive and recent fall, initial encounter EXAM: CT HEAD WITHOUT CONTRAST CT CERVICAL SPINE WITHOUT CONTRAST TECHNIQUE: Multidetector CT imaging of the head and cervical spine was performed following the standard protocol without intravenous contrast. Multiplanar CT image reconstructions of the cervical spine were also generated. COMPARISON:  10/10/2018, 07/14/2018 FINDINGS: CT HEAD FINDINGS Brain: Diffuse edema is noted  within the right cerebral hemisphere with sulcal blunting and mild mass effect from right to left of approximately 3 mm. Hyperdense MCA is noted on the right consistent with thrombosis chronic ischemic changes are again seen in the left cerebellar hemisphere. 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 No acute hemorrhage is seen. Vascular: Hyperdense right MCA is noted. Skull: Skull is intact. Sinuses/Orbits: Paranasal sinuses are within normal limits. The orbits and their contents are unremarkable. Other: Scalp hematoma is noted posteriorly near the vertex. CT CERVICAL SPINE FINDINGS Alignment: Mild straightening of the normal cervical lordosis is noted. Skull base and vertebrae: 7 cervical segments are well visualized. Stable superior endplate deformity is noted at C7 and T2 similar to that noted on a prior exam from 07/14/2018. No acute fracture or acute facet abnormality is noted. The odontoid is within normal limits. Mild facet hypertrophic changes are seen. Soft tissues and spinal canal: Surrounding soft tissues demonstrates some subcutaneous edema along the musculature of the posterior neck on the right this may be related to the recent injury. No focal hematoma is noted. Endotracheal tube and gastric catheter are noted. No other soft tissue abnormality is noted. Upper chest: Mild emphysematous changes are seen. Other: None IMPRESSION: CT of the head: Diffuse edema within the right cerebral hemisphere particularly in the distribution of the right middle cerebral artery consistent with acute ischemia. This is superimposed over chronic ischemic changes and mild midline shift from right to left is noted of approximately 3 mm. No definitive mass lesion is noted. Changes of mild hyperdense right MCA are noted. Chronic atrophic and ischemic changes stable from the  previous exam. Posterior scalp hematoma near the vertex. CT of the cervical spine: Degenerative and chronic changes stable from the previous exam.  No acute bony abnormality noted. Soft tissue changes in the musculature of the posterior neck on the right likely related to recent fall. Critical Value/emergent results were called by telephone at the time of interpretation on 01/02/2019 at 8:39 pm to [facility] , who verbally acknowledged these results. Electronically Signed   By: Inez Catalina M.D.   On: 01/02/2019 20:45   Mr Brain Wo Contrast  Result Date: 01/03/2019 CLINICAL DATA:  Unresponsive. Hypertensive on presentation. Febrile. History of substance abuse. EXAM: MRI HEAD WITHOUT CONTRAST TECHNIQUE: Multiplanar, multiecho pulse sequences of the brain and surrounding structures were obtained without intravenous contrast. COMPARISON:  Head CT 01/02/2019 and MRI 10/10/2018 FINDINGS: Some sequences are moderately motion degraded. Brain: There is marked T2 hyperintensity/edema throughout the majority of the white matter in the right cerebral hemisphere, particularly posteriorly and at the vertex. There is some cortical involvement as well as right basal ganglia involvement. There is associated mild cortical diffusion diffusion abnormality of which the majority does not clearly demonstrate substantially reduced ADC. A 3 mm focus of clearly reduced ADC is noted at the right frontotemporal junction. A small amount of cortical diffusion abnormality is also present in the medial left frontal lobe. Milder T2 hyperintensity is present in left cerebral hemispheric white matter, mainly in the occipital and posterior temporal lobes. There is also extensive, relatively symmetric T2 hyperintensity in the cerebellum bilaterally and moderate T2 hyperintensity in the pons. The thalami and internal capsules are also involved bilaterally. There is partial effacement of the right lateral ventricle without significant midline shift. They are is no tonsillar herniation. Numerous small chronic infarcts are again seen in the cerebellum bilaterally and in the superior aspect  of the left thalamus. Chronic watershed infarcts are again seen in the left frontal and parietal lobes, and small chronic infarcts are again seen in the right frontal and right parietal lobes. Small volume hemosiderin deposition is noted with some of the chronic infarcts, and there is also evidence of remote subarachnoid hemorrhage at the left frontal vertex. There is no extra-axial fluid collection. 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 Skull and upper cervical spine: Unremarkable bone marrow signal. Sinuses/Orbits: Rightward gaze. Paranasal sinuses and mastoid air cells are clear. Other: None. IMPRESSION: 1. Extensive T2 signal abnormality throughout the right greater than left cerebral hemispheres and bilateral cerebellum affecting white matter greater than gray matter. Considerations include atypical posterior reversible encephalopathy syndrome, infectious encephalitis, and toxic leukoencephalopathy (such as from drug abuse). 2. Mild cortical diffusion abnormality predominantly in the right cerebral hemisphere, largely without reduced ADC. Query seizure activity. 3. Multiple chronic infarcts as above. Electronically Signed   By: Logan Bores M.D.   On: 01/03/2019 13:36   Dg Chest Port 1 View  Result Date: 01/03/2019 CLINICAL DATA:  Intubation EXAM: PORTABLE CHEST 1 VIEW COMPARISON:  01/02/2019 FINDINGS: Endotracheal tube tip is 4 cm above the inferior margin of the clavicle. Enteric tube courses beyond the field of view. The lungs are clear. Normal cardiomediastinal contours. IMPRESSION: Radiographically appropriate position of endotracheal tube. Electronically Signed   By: Ulyses Jarred M.D.   On: 01/03/2019 17:28   Dg Chest Portable 1 View  Result Date: 01/02/2019 CLINICAL DATA:  Status post intubation EXAM: PORTABLE CHEST 1 VIEW COMPARISON:  Film from earlier in the same day. FINDINGS: Cardiac shadows within  normal limits. Endotracheal tube and  gastric catheter are noted in satisfactory position. Increased right basilar atelectasis is noted projecting in the lower lobes is new from the prior exam. No bony abnormality is noted. IMPRESSION: Right lower lobe consolidation. Electronically Signed   By: Inez Catalina M.D.   On: 01/02/2019 21:08   Dg Chest Port 1 View  Result Date: 01/02/2019 CLINICAL DATA:  Shortness of breath EXAM: PORTABLE CHEST 1 VIEW COMPARISON:  07/13/2018 FINDINGS: The heart size and mediastinal contours are within normal limits. Both lungs are clear. The visualized skeletal structures are unremarkable. IMPRESSION: No active disease. Electronically Signed   By: Ulyses Jarred M.D.   On: 01/02/2019 20:32     CBC Recent Labs  Lab 01/02/19 1938 01/02/19 2133 01/03/19 0037 01/03/19 0227 01/03/19 0237 01/04/19 1112  WBC 15.2*  --  22.3*  --  16.3* 10.6*  HGB 16.7* 16.0* 16.2* 15.0 15.8* 12.1  HCT 54.7* 47.0* 51.5* 44.0 50.4* 38.9  PLT 454*  --  435*  --  331 249  MCV 89.4  --  89.4  --  88.4 87.4  MCH 27.3  --  28.1  --  27.7 27.2  MCHC 30.5  --  31.5  --  31.3 31.1  RDW 14.6  --  14.7  --  14.8 14.6  LYMPHSABS 1.2  --   --   --   --   --   MONOABS 1.0  --   --   --   --   --   EOSABS 0.0  --   --   --   --   --   BASOSABS 0.0  --   --   --   --   --     Chemistries  Recent Labs  Lab 01/02/19 1938  01/03/19 0237 01/04/19 0426 01/04/19 1112 01/04/19 1639 01/05/19 0656 01/05/19 1658 01/06/19 0419 01/07/19 0427  NA 140   < > 141  --  141  --  142  --  139 141  K 4.4   < > 4.6  --  3.3*  --  3.5  --  3.3* 3.2*  CL 106  --  110  --  112*  --  109  --  108 109  CO2 13*  --  17*  --  22  --  23  --  21* 20*  GLUCOSE 182*  --  156*  --  124*  --  130*  --  127* 108*  BUN 29*  --  23*  --  21*  --  17  --  13 11  CREATININE 1.34*   < > 0.94  --  0.64  --  0.68  --  0.55 0.56  CALCIUM 9.8  --  8.6*  --  8.2*  --  8.4*  --  8.4* 8.3*  MG  --   --  1.8 1.9  --  2.0 2.1 2.0  --   --   AST 44*  --   --   --    --   --   --   --   --   --   ALT 18  --   --   --   --   --   --   --   --   --   ALKPHOS 111  --   --   --   --   --   --   --   --   --  BILITOT 0.5  --   --   --   --   --   --   --   --   --    < > = values in this interval not displayed.   ------------------------------------------------------------------------------------------------------------------ Recent Labs    01/05/19 0656 01/06/19 0419  TRIG 68 148    Lab Results  Component Value Date   HGBA1C 5.9 (H) 07/14/2018   ------------------------------------------------------------------------------------------------------------------ No results for input(s): TSH, T4TOTAL, T3FREE, THYROIDAB in the last 72 hours.  Invalid input(s): FREET3 ------------------------------------------------------------------------------------------------------------------ No results for input(s): VITAMINB12, FOLATE, FERRITIN, TIBC, IRON, RETICCTPCT in the last 72 hours.  Coagulation profile No results for input(s): INR, PROTIME in the last 168 hours.  No results for input(s): DDIMER in the last 72 hours.  Cardiac Enzymes Recent Labs  Lab 01/02/19 1938 01/04/19 1112  TROPONINI 0.98* 0.19*   ------------------------------------------------------------------------------------------------------------------    Component Value Date/Time   BNP 97.4 01/01/2018 2049   Roxan Hockey M.D on 01/07/2019 at 2:45 PM  Go to [email or web address] - for contact info  [facility] - Office  [phone]

## 2019-01-07 NOTE — Evaluation (Signed)
Occupational Therapy Evaluation Patient Details Name: Kelsey Wade MRN: [id number] DOB: 08-04-1961 Today's Date: 01/07/2019    History of Present Illness 58 y.o. female presented via EMS with complaints of unresponsive mental status in setting of acute encephalopathy secondary atypical PRES, subclinical seizures. PMH includes cerebral edema, HTN, and Stroke   Clinical Impression   Pt admitted with above. She demonstrates the below listed deficits and will benefit from continued OT to maximize safety and independence with BADLs.  Pt presents to OT with residual Rt hemiparesis with Rt UE contractures from previous CVA; impaired balance, Rt body inattention/neglect, perseveration, motor planning deficits, as well as impairments with problem solving and sequencing.  She currently requires mod - total A for ADLs and max A for functional transfer.   She reports she lived with her spouse prior to [place] and he assisted her with bathing and dressing.   Recommend CIR.       Follow Up Recommendations  CIR    Equipment Recommendations  3 in 1 bedside commode    Recommendations for Other Services Rehab consult     Precautions / Restrictions Precautions Precautions: Fall      Mobility Bed Mobility               General bed mobility comments: Pt sitting in chair   Transfers Overall transfer level: Needs assistance Equipment used: 2 person hand held assist Transfers: Sit to/from Stand;Stand Pivot Transfers Sit to Stand: Mod assist Stand pivot transfers: Max assist;+2 safety/equipment       General transfer comment: Pt required assist to move into standing.  She was unable to motor plan how to turn to the Lt and just kept advancing the [place].  Required assist to complete the transfer and sit     Balance Overall balance assessment: Needs assistance Sitting-balance support: Feet supported Sitting balance-Leahy Scale: Fair Sitting balance - Comments: leans to the Rt     Standing balance support: During functional activity;Single extremity supported Standing balance-Leahy Scale: Poor Standing balance comment: Requires UE support and min - mod A for static standing                            ADL either performed or assessed with clinical judgement   ADL Overall ADL's : Needs assistance/impaired Eating/Feeding: Set up;Sitting   Grooming: Brushing hair;Wash/dry hands;Wash/dry face;Moderate assistance;Sitting Grooming Details (indicate cue type and reason): assist for thoroughness when brushing hair  Upper Body Bathing: Maximal assistance;Sitting   Lower Body Bathing: Maximal assistance;Sit to/from stand   Upper Body Dressing : Maximal assistance;Sitting   Lower Body Dressing: Total assistance;Sit to/from stand   Toilet Transfer: +2 for safety/equipment;Stand-pivot;BSC;RW;Maximal assistance Toilet Transfer Details (indicate cue type and reason): Pt demonstrates difficulty fully turning to the Lt.  She repeatedly advanced Rt foot forward instead of turning, required max A to complete transfer using RW  Toileting- Clothing Manipulation and Hygiene: Total assistance;Sit to/from stand       Functional mobility during ADLs: Maximal assistance;+2 for safety/equipment;Rolling walker       Vision Baseline Vision/History: Wears glasses Wears Glasses: At all times Additional Comments: Will benefit from further testing      Perception Perception Perception Tested?: Yes Perception Deficits: Inattention/neglect Inattention/Neglect: Does not attend to right side of body Comments: Rt UE was inside of her gown.  She repeatedly raised her Lt UE when asked to raise her Rt hand, and did the same when asked to raise  the other hand, and both hands.    Praxis Praxis Praxis tested?: Deficits Deficits: Perseveration;Ideomotor    Pertinent Vitals/Pain Pain Assessment: No/denies pain     Hand Dominance Left   Extremity/Trunk Assessment Upper  Extremity Assessment Upper Extremity Assessment: RUE deficits/detail RUE Deficits / Details: Pt with mod flexor spasticty with contractures throughout - residual from previous CVA  RUE Sensation: decreased proprioception RUE Coordination: decreased fine motor;decreased gross motor   Lower Extremity Assessment Lower Extremity Assessment: Defer to PT evaluation   Cervical / Trunk Assessment Cervical / Trunk Assessment: Other exceptions Cervical / Trunk Exceptions: pt with lateral flexion Lt trunk    Communication Communication Communication: No difficulties   Cognition Arousal/Alertness: Awake/alert Behavior During Therapy: Impulsive Overall Cognitive Status: Impaired/Different from baseline Area of Impairment: Orientation;Attention;Safety/judgement;Following commands;Awareness;Problem solving                 Orientation Level: Disoriented to;Place([place] ) Current Attention Level: Selective Memory: Decreased short-term memory Following Commands: Follows one step commands consistently;Follows multi-step commands inconsistently Safety/Judgement: Decreased awareness of safety;Decreased awareness of deficits Awareness: Intellectual Problem Solving: Slow processing;Difficulty sequencing;Requires verbal cues;Requires tactile cues General Comments: motor perseveration noted.  Pt requires mod cues/assist for problem solving and sequencing    General Comments       Exercises     Shoulder Instructions      Home Living Family/patient expects to be discharged to:: Private residence Living Arrangements: Alone;Other (Comment)   Type of Home: House Home Access: Stairs to enter [employer] of Steps: 2 Entrance Stairs-Rails: Can reach both Home Layout: One level     Bathroom Shower/Tub: Teacher, early years/pre: Standard Bathroom Accessibility: Yes How Accessible: Accessible via walker Home Equipment: [place] - 2 wheels;Cane - single point    Additional Comments: Pt reports she lives with her spouse and uncle who are able to assist as needed      Prior Functioning/Environment Level of Independence: Needs assistance  Gait / Transfers Assistance Needed: Pt indicates she was ambulating mod I ADL's / Homemaking Assistance Needed: Pt reports her spouse assists her with ADLs and IADLs             OT Problem List: Decreased strength;Decreased range of motion;Decreased activity tolerance;Impaired balance (sitting and/or standing);Impaired vision/perception;Decreased coordination;Decreased cognition;Decreased safety awareness;Decreased knowledge of use of DME or AE      OT Treatment/Interventions: Self-care/ADL training;Neuromuscular education;DME and/or AE instruction;Therapeutic activities;Cognitive remediation/compensation;Visual/perceptual remediation/compensation;Patient/family education;Balance training    OT Goals(Current goals can be found in the care plan section) Acute Rehab OT Goals Patient Stated Goal: She didn't state OT Goal Formulation: With patient Time For Goal Achievement: 01/21/19 Potential to Achieve Goals: Good ADL Goals Pt Will Perform Grooming: with min assist;standing Pt Will Perform Upper Body Bathing: with mod assist;sitting Pt Will Perform Lower Body Bathing: with mod assist;sit to/from stand Pt Will Transfer to Toilet: with min assist;ambulating;regular height toilet;grab bars Pt Will Perform Toileting - Clothing Manipulation and hygiene: with min assist;sit to/from stand Additional ADL Goal #1: Pt will locate items on R twith min A  during ADLs  OT Frequency: Min 2X/week   Barriers to D/C:            Co-evaluation              AM-PAC OT "6 Clicks" Daily Activity     Outcome Measure Help from another person eating meals?: A Little Help from another person taking care of personal grooming?: A Lot Help from another person toileting,  which includes using toliet, bedpan, or urinal?: A  Lot Help from another person bathing (including washing, rinsing, drying)?: A Lot Help from another person to put on and taking off regular upper body clothing?: A Lot Help from another person to put on and taking off regular lower body clothing?: Total 6 Click Score: 12   End of Session Nurse Communication: Mobility status  Activity Tolerance: Patient tolerated treatment well Patient left: in chair;with call bell/phone within reach;with chair alarm set  OT Visit Diagnosis: Unsteadiness on feet (R26.81);Cognitive communication deficit (R41.841);Hemiplegia and hemiparesis Hemiplegia - Right/Left: Right Hemiplegia - dominant/non-dominant: Non-Dominant Hemiplegia - caused by: Cerebral infarction                Time: 1323-1416 OT Time Calculation (min): 53 min Charges:  OT General Charges $OT Visit: 1 Visit OT Evaluation $OT Eval Moderate Complexity: 1 Mod OT Treatments $Self Care/Home Management : 38-52 mins  Lucille Passy, OTR/L Acute Rehabilitation Services Pager [phone] Office [phone]  Lucille Passy M 01/07/2019, 5:37 PM

## 2019-01-07 NOTE — Progress Notes (Signed)
Physical Therapy Treatment Patient Details Name: Kelsey Wade MRN: [id number] DOB: 1961/04/02 Today's Date: 01/07/2019    History of Present Illness 58 y.o. female presented via EMS with complaints of unresponsive mental status in setting of acute encephalopathy secondary atypical PRES, subclinical seizures. PMH includes cerebral edema, HTN, and Stroke    PT Comments    Patient seen for activity progression. Tolerated session well overall but remains unsteady and limited with mobility. External assist required. OF NOTE: patient with visual hallucinations during session, reports seeing "crickets" in the room during session, additionally, patient very easily distracted during activity. Current POC remains appropriate. Will continue to see and progress as tolerated.    Follow Up Recommendations  CIR     Equipment Recommendations  (TBD)    Recommendations for Other Services Rehab consult     Precautions / Restrictions Precautions Precautions: Fall Restrictions Weight Bearing Restrictions: No    Mobility  Bed Mobility Overal bed mobility: Needs Assistance Bed Mobility: Rolling;Sidelying to Sit Rolling: Min assist Sidelying to sit: Mod assist       General bed mobility comments: moderate assist to elevate trunk and scoot to EOB. increased time and effort to perform  Transfers Overall transfer level: Needs assistance Equipment used: 1 person hand held assist Transfers: Sit to/from Stand Sit to Stand: Min assist         General transfer comment: min assist to power to upright and maintain stabiility  Ambulation/Gait Ambulation/Gait assistance: Mod assist;Min assist Gait Distance (Feet): 60 Feet Assistive device: 1 person hand held assist Gait Pattern/deviations: Ataxic;Drifts right/left;Staggering right;Narrow base of support;Trunk flexed;Shuffle;Step-through pattern;Decreased stride length Gait velocity: decreased Gait velocity interpretation: <1.8 ft/sec,  indicate of risk for recurrent falls General Gait Details: Patient continues to demonstrate increased need for physical assist, fluctuating from min to moderate assist with noted truncal instability. Improved with increased cadence but remains unsteady.    Stairs             Wheelchair Mobility    Modified Rankin (Stroke Patients Only) Modified Rankin (Stroke Patients Only) Pre-Morbid Rankin Score: Moderate disability Modified Rankin: Moderately severe disability     Balance Overall balance assessment: Needs assistance   Sitting balance-Leahy Scale: Fair Sitting balance - Comments: able to sit EOB self supported    Standing balance support: During functional activity;Single extremity supported Standing balance-Leahy Scale: Poor Standing balance comment: contiues to rely on external assist                            Cognition Arousal/Alertness: Awake/alert Behavior During Therapy: Flat affect Overall Cognitive Status: No family/caregiver present to determine baseline cognitive functioning Area of Impairment: Awareness;Safety/judgement;Problem solving;Following commands;Memory;Attention                   Current Attention Level: Selective Memory: Decreased short-term memory Following Commands: Follows one step commands inconsistently Safety/Judgement: Decreased awareness of safety;Decreased awareness of deficits Awareness: Emergent Problem Solving: Slow processing;Decreased initiation;Requires verbal cues;Requires tactile cues;Difficulty sequencing General Comments: patient with visual hallucinations reports seeing crickets during session. Also easily distracted by environement      Exercises      General Comments        Pertinent Vitals/Pain Pain Assessment: Faces Faces Pain Scale: Hurts little more Pain Location: right arm Pain Descriptors / Indicators: Sore Pain Intervention(s): Monitored during session    Home Living  Prior Function            PT Goals (current goals can now be found in the care plan section) Acute Rehab PT Goals Patient Stated Goal: to get out of bed PT Goal Formulation: With patient Time For Goal Achievement: 01/20/19 Potential to Achieve Goals: Good Progress towards PT goals: Progressing toward goals    Frequency    Min 4X/week      PT Plan Current plan remains appropriate    Co-evaluation              AM-PAC PT "6 Clicks" Mobility   Outcome Measure  Help needed turning from your back to your side while in a flat bed without using bedrails?: A Little Help needed moving from lying on your back to sitting on the side of a flat bed without using bedrails?: A Little Help needed moving to and from a bed to a chair (including a wheelchair)?: A Lot Help needed standing up from a chair using your arms (e.g., wheelchair or bedside chair)?: A Lot Help needed to walk in hospital room?: A Lot Help needed climbing 3-5 steps with a railing? : A Lot 6 Click Score: 14    End of Session Equipment Utilized During Treatment: Gait belt Activity Tolerance: Patient tolerated treatment well;Patient limited by fatigue Patient left: in chair;with call bell/phone within reach;with chair alarm set Nurse Communication: Mobility status PT Visit Diagnosis: Unsteadiness on feet (R26.81);Difficulty in walking, not elsewhere classified (R26.2);Other symptoms and signs involving the nervous system (G40.102)     Time: 7253-6644 PT Time Calculation (min) (ACUTE ONLY): 17 min  Charges:                        Alben Deeds, PT DPT  Board Certified Neurologic Specialist [place] Pager [phone] Office [place] 01/07/2019, 11:37 AM

## 2019-01-08 LAB — CULTURE, BLOOD (ROUTINE X 2)
Culture: NO GROWTH
Culture: NO GROWTH
Special Requests: ADEQUATE

## 2019-01-08 LAB — BASIC METABOLIC PANEL
Anion gap: 11 (ref 5–15)
BUN: 14 mg/dL (ref 6–20)
CO2: 22 mmol/L (ref 22–32)
Calcium: 8.5 mg/dL — ABNORMAL LOW (ref 8.9–10.3)
Chloride: 108 mmol/L (ref 98–111)
Creatinine, Ser: 0.61 mg/dL (ref 0.44–1.00)
GFR calc Af Amer: >60 mL/min (ref >60)
GFR calc non Af Amer: >60 mL/min (ref >60)
Glucose, Bld: 108 mg/dL — ABNORMAL HIGH (ref 70–99)
Potassium: 2.9 mmol/L — ABNORMAL LOW (ref 3.5–5.1)
Sodium: 141 mmol/L (ref 135–145)

## 2019-01-08 LAB — MAGNESIUM: Magnesium: 1.9 mg/dL (ref 1.7–2.4)

## 2019-01-08 MED ORDER — HYDROXYZINE HCL 25 MG PO TABS: 25.0000 mg | ORAL_TABLET | Freq: Three times a day (TID) | ORAL | 3 refills | Status: DC | PRN

## 2019-01-08 MED ORDER — FOLIC ACID 1 MG PO TABS: 1.0000 mg | ORAL_TABLET | Freq: Every day | ORAL | 2 refills | Status: DC

## 2019-01-08 MED ORDER — NICOTINE 14 MG/24HR TD PT24: 14.0000 mg | MEDICATED_PATCH | Freq: Every day | TRANSDERMAL | 0 refills | Status: AC

## 2019-01-08 MED ORDER — ENSURE ENLIVE PO LIQD
237.0000 mL | Freq: Two times a day (BID) | ORAL | Status: DC
  Administered 2019-01-08: 16:00:00 237 mL via ORAL

## 2019-01-08 MED ORDER — ALBUTEROL SULFATE HFA 108 (90 BASE) MCG/ACT IN AERS: 2.0000 | INHALATION_SPRAY | Freq: Four times a day (QID) | RESPIRATORY_TRACT | 1 refills | Status: AC | PRN

## 2019-01-08 MED ORDER — AMLODIPINE BESYLATE 10 MG PO TABS: 10.0000 mg | ORAL_TABLET | Freq: Every day | ORAL | 5 refills | Status: AC

## 2019-01-08 MED ORDER — CLOPIDOGREL BISULFATE 75 MG PO TABS: 75.0000 mg | ORAL_TABLET | Freq: Every day | ORAL | 3 refills | Status: AC

## 2019-01-08 MED ORDER — ASPIRIN EC 81 MG PO TBEC: 81.0000 mg | DELAYED_RELEASE_TABLET | Freq: Every day | ORAL | 3 refills | Status: DC

## 2019-01-08 MED ORDER — POLYETHYLENE GLYCOL 3350 17 G PO PACK: 17.0000 g | PACK | Freq: Every day | ORAL | 0 refills | Status: AC | PRN

## 2019-01-08 MED ORDER — VENLAFAXINE HCL ER 150 MG PO CP24: 150.0000 mg | ORAL_CAPSULE | Freq: Every day | ORAL | 4 refills | Status: DC

## 2019-01-08 MED ORDER — DIVALPROEX SODIUM 250 MG PO DR TAB: 750.0000 mg | DELAYED_RELEASE_TABLET | Freq: Two times a day (BID) | ORAL | 3 refills | Status: DC

## 2019-01-08 MED ORDER — THIAMINE HCL 100 MG PO TABS: 100.0000 mg | ORAL_TABLET | Freq: Every day | ORAL | 2 refills | Status: DC

## 2019-01-08 MED ORDER — CARVEDILOL 3.125 MG PO TABS: 3.1250 mg | ORAL_TABLET | Freq: Two times a day (BID) | ORAL | 4 refills | Status: AC

## 2019-01-08 MED ORDER — NICOTINE 14 MG/24HR TD PT24
14.0000 mg | MEDICATED_PATCH | Freq: Every day | TRANSDERMAL | Status: DC
  Administered 2019-01-08: 11:00:00 14 mg via TRANSDERMAL
  Filled 2019-01-08: qty 1

## 2019-01-08 MED ORDER — ATORVASTATIN CALCIUM 40 MG PO TABS: 40.0000 mg | ORAL_TABLET | Freq: Every day | ORAL | 11 refills | Status: AC

## 2019-01-08 MED ORDER — LEVETIRACETAM 750 MG PO TABS: 1500.0000 mg | ORAL_TABLET | Freq: Two times a day (BID) | ORAL | 3 refills | Status: AC

## 2019-01-08 MED ORDER — POTASSIUM CHLORIDE CRYS ER 20 MEQ PO TBCR
40.0000 meq | EXTENDED_RELEASE_TABLET | ORAL | Status: AC
  Administered 2019-01-08 (×3): 40 meq via ORAL
  Filled 2019-01-08 (×3): qty 2

## 2019-01-08 MED ORDER — FAMOTIDINE 20 MG PO TABS: 20.0000 mg | ORAL_TABLET | Freq: Every day | ORAL | 1 refills | Status: AC

## 2019-01-08 MED ORDER — BACLOFEN 5 MG PO TABS: 5.0000 mg | ORAL_TABLET | Freq: Two times a day (BID) | ORAL | 0 refills | Status: AC

## 2019-01-08 MED ORDER — POTASSIUM CHLORIDE ER 20 MEQ PO TBCR: 20.0000 meq | EXTENDED_RELEASE_TABLET | Freq: Every day | ORAL | 0 refills | Status: DC

## 2019-01-08 MED ORDER — VITAMIN D (ERGOCALCIFEROL) 1.25 MG (50000 UNIT) PO CAPS: 50000.0000 [IU] | ORAL_CAPSULE | ORAL | 3 refills | Status: DC

## 2019-01-08 NOTE — Discharge Instructions (Signed)
1) you have refused to go to rehab facility as advised-----a physical therapist will be  coming to your house to help you with your rehab 2) please take medications as prescribed 3) low-salt, low-cholesterol low-fat diet strongly advised 4) you are taking aspirin and Plavix which are blood thinners so Avoid ibuprofen/Advil/Aleve/Motrin/Goody Powders/Naproxen/BC powders/Meloxicam/Diclofenac/Indomethacin and other Nonsteroidal anti-inflammatory medications as these will make you more likely to bleed and can cause stomach ulcers, can also cause Kidney problems.  5) keep a diary of her blood pressure check----follow-up to primary care physician in 1 to 2 weeks for recheck and reevaluation

## 2019-01-08 NOTE — Progress Notes (Signed)
Physical Therapy Treatment Patient Details Name: Kelsey Wade MRN: [id number] DOB: 12-10-1960 Today's Date: 01/08/2019    History of Present Illness 58 y.o. female presented via EMS with complaints of unresponsive mental status in setting of acute encephalopathy secondary atypical PRES, subclinical seizures. PMH includes cerebral edema, HTN, and Stroke    PT Comments    Patient seen for mobility progression. Pt requires min/mod A for functional transfers and gait training with single UE support required. Continue to recommend CIR for further skilled PT services to maximize independence and safety with mobility.     Follow Up Recommendations  CIR     Equipment Recommendations  (TBD)    Recommendations for Other Services Rehab consult     Precautions / Restrictions Precautions Precautions: Fall Restrictions Weight Bearing Restrictions: No    Mobility  Bed Mobility               General bed mobility comments: pt OOB in chair upon arrival  Transfers Overall transfer level: Needs assistance Equipment used: 1 person hand held assist Transfers: Sit to/from Stand Sit to Stand: Min assist         General transfer comment: assistance for balance and to power up into standing  Ambulation/Gait Ambulation/Gait assistance: Mod assist;Min assist Gait Distance (Feet): 60 Feet Assistive device: 1 person hand held assist Gait Pattern/deviations: Ataxic;Drifts right/left;Staggering right;Narrow base of support;Trunk flexed;Step-through pattern;Decreased stride length Gait velocity: decreased   General Gait Details: assistance required to maintain balance throughout and increased assist with horizontal head turns, turning/directional changes; staggering to R side when scanning L    Stairs             Wheelchair Mobility    Modified Rankin (Stroke Patients Only) Modified Rankin (Stroke Patients Only) Pre-Morbid Rankin Score: Moderate disability Modified  Rankin: Moderately severe disability     Balance Overall balance assessment: Needs assistance   Sitting balance-Leahy Scale: Fair     Standing balance support: During functional activity;Single extremity supported Standing balance-Leahy Scale: Poor Standing balance comment: contiues to rely on external assist                            Cognition Arousal/Alertness: Awake/alert Behavior During Therapy: WFL for tasks assessed/performed Overall Cognitive Status: No family/caregiver present to determine baseline cognitive functioning Area of Impairment: Awareness;Safety/judgement;Problem solving;Following commands;Attention                   Current Attention Level: Selective   Following Commands: Follows one step commands with increased time Safety/Judgement: Decreased awareness of safety;Decreased awareness of deficits Awareness: Emergent Problem Solving: Slow processing;Decreased initiation;Requires verbal cues;Difficulty sequencing        Exercises      General Comments        Pertinent Vitals/Pain Pain Assessment: No/denies pain    Home Living                      Prior Function            PT Goals (current goals can now be found in the care plan section) Progress towards PT goals: Progressing toward goals    Frequency    Min 4X/week      PT Plan Current plan remains appropriate    Co-evaluation              AM-PAC PT "6 Clicks" Mobility   Outcome Measure  Help needed turning from your back to  your side while in a flat bed without using bedrails?: A Little Help needed moving from lying on your back to sitting on the side of a flat bed without using bedrails?: A Little Help needed moving to and from a bed to a chair (including a wheelchair)?: A Little Help needed standing up from a chair using your arms (e.g., wheelchair or bedside chair)?: A Little Help needed to walk in hospital room?: A Lot Help needed climbing  3-5 steps with a railing? : A Lot 6 Click Score: 16    End of Session Equipment Utilized During Treatment: Gait belt Activity Tolerance: Patient tolerated treatment well Patient left: in chair;with call bell/phone within reach;with chair alarm set Nurse Communication: Mobility status PT Visit Diagnosis: Unsteadiness on feet (R26.81);Difficulty in walking, not elsewhere classified (R26.2);Other symptoms and signs involving the nervous system (R29.898)     Time: 3094-0768 PT Time Calculation (min) (ACUTE ONLY): 22 min  Charges:  $Gait Training: 8-22 mins                     Earney Navy, PTA Acute Rehabilitation Services Pager: [phone] Office: [phone]     Darliss Cheney 01/08/2019, 1:25 PM

## 2019-01-08 NOTE — TOC Transition Note (Addendum)
Transition of Care [facility]) - CM/SW Discharge Note   Patient Details  Name: Kelsey Wade MRN: [id number] Date of Birth: 04/11/1961  Transition [facility] CM/SW Contact:  Pollie Friar, RN Phone Number: 01/08/2019, 4:05 PM   Clinical Narrative:    Recommendations are for CIR. Pt is refusing and wants to d/c home with [facility] services. CM also spoke to pts spouse and he is in agreement with her d/cing home.  Pt has transportation home.   Final next level of care: Home w Home Health Services Barriers to Discharge: No Barriers Identified   Patient Goals and CMS Choice Patient states their goals for this hospitalization and ongoing recovery are:: to be home with my husband CMS Medicare.gov Compare Post Acute Care list provided to:: Patient Choice offered to / list presented to : Patient  Discharge Placement                       Discharge Plan and Services   Discharge Planning Services: CM Consult Post Acute Care Choice: Home Health, Durable Medical Equipment          DME Arranged: 3-N-1 DME Agency: [facility] HH Arranged: RN, PT, OT [place] Agency: [place] [facility]na with [facility] accepted the referral.   Social Determinants of Health (SDOH) Interventions     Readmission Risk Interventions No flowsheet data found.

## 2019-01-08 NOTE — Progress Notes (Signed)
Pharmacy Antibiotic Note  Kelsey Wade is a 58 y.o. female admitted on 01/02/2019 with possible  pneumonia.    Continues on cefepime - Day 7  Plan: Consider stopping Cefepime or switching to po agent   Height: 5\' 5"  (165.1 cm) Weight: 143 lb 8.3 oz (65.1 kg) IBW/kg (Calculated) : 57  Temp (24hrs), Avg:98.4 F (36.9 C), Min:98.2 F (36.8 C), Max:98.9 F (37.2 C)  Recent Labs  Lab 01/02/19 1938 01/02/19 2332 01/03/19 0037 01/03/19 0237 01/03/19 0605 01/04/19 1112 01/04/19 1402 01/05/19 0656 01/06/19 0419 01/07/19 0427 01/08/19 0502  WBC 15.2*  --  22.3* 16.3*  --  10.6*  --   --   --   --   --   CREATININE 1.34*  --  1.10* 0.94  --  0.64  --  0.68 0.55 0.56 0.61  LATICACIDVEN 7.9* 4.0*  --  2.9* 3.4* 1.6 1.5  --   --   --   --     Estimated Creatinine Clearance: 70.7 mL/min (by C-G formula based on SCr of 0.61 mg/dL).    No Known Allergies   Thank you Anette Guarneri, PharmD [phone]  01/08/2019 10:05 AM

## 2019-01-08 NOTE — Progress Notes (Signed)
SLP Cancellation Note  Patient Details Name: Kelsey Wade MRN: [id number] DOB: 1961-01-09   Cancelled treatment:       Reason Eval/Treat Not Completed: Patient declined, no reason specified. Patient declined PO's for diet check and per RN, patient is a planned discharge home with husband and home health therapy services. (patient declined CIR and husband stated he was able to care for her adequately at home). Of note, patient's cognition and expressive language are both significantly improved as compared to initial evaluation on 01/06/19 and as patient has had a CVA in past, she is likely at or near her baseline; SLP no longer requesting SLE.   Nadara Mode Tarrell 01/08/2019, 4:40 PM    Sonia Baller, MA, CCC-SLP Speech Therapy [facility] Acute Rehab Pager: [phone]

## 2019-01-08 NOTE — Progress Notes (Signed)
Inpatient Rehabilitation-Admissions Coordinator   Inpatient Rehabilitation-Admissions Coordinator    Met with patient at the bedside to discuss team's recommendation for inpatient rehabilitation. Shared booklets, expectations while in CIR, expected length of stay, and anticipated functional level at DC.   Pt has declined CIR and wants to return home with [facility] instead. Pt feels she has enough support to return home. With permission, [facility] contacted her spouse who confirmed he can assist her at home.   AC has communicated with CM/SW regarding need for new dispo plans.   AC will sign off.   Jhonnie Garner, OTR/L  Rehab Admissions Coordinator  [phone] 01/08/2019 1:57 PM
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
## 2019-01-08 NOTE — Discharge Summary (Signed)
Kelsey Wade, is a 57 y.o. female  DOB 01/19/1961  MRN [id number].  Admission date:  01/02/2019  Admitting Physician  Renee Pain, MD  Discharge Date:  01/08/2019   Primary MD  Elenore Paddy, FNP  Recommendations for primary care physician for things to follow:  1) you have refused to go to rehab facility as advised-----a physical therapist will be  coming to your house to help you with your rehab 2) please take medications as prescribed 3) low-salt, low-cholesterol low-fat diet strongly advised 4) you are taking aspirin and Plavix which are blood thinners so Avoid ibuprofen/Advil/Aleve/Motrin/Goody Powders/Naproxen/BC powders/Meloxicam/Diclofenac/Indomethacin and other Nonsteroidal anti-inflammatory medications as these will make you more likely to bleed and can cause stomach ulcers, can also cause Kidney problems.  5) keep a diary of her blood pressure check----follow-up to primary care physician in 1 to 2 weeks for recheck and reevaluation   Admission Diagnosis  unresponsive   Discharge Diagnosis  unresponsive   Principal Problem:   PRES (posterior reversible encephalopathy syndrome) Active Problems:   HTN (hypertension)   Cerebral edema (HCC)   Elevated troponin   Acute hypoxemic respiratory failure (HCC)      Past Medical History:  Diagnosis Date   Anxiety    Arthritis    Chronic back pain    Depression    ETOH abuse    GERD (gastroesophageal reflux disease)    HTN (hypertension)    Hyperlipidemia    Stroke [facility])    June 2018   Tobacco abuse     Past Surgical History:  Procedure Laterality Date   APPENDECTOMY         HPI  from the history and physical done on the day of admission:    This 58 y.o. Caucasian female presented to the [facility] Emergency Department via EMS with complaints of unresponsive mental status.  The family reports  that her last known well time was over 24 hours ago.  She was reportedly responsive to noxious stimuli only. She wase 3 LPM via [place].  She failed to improve with NRB and was intubated in the ER.  At the found to be hypertensive and tachycardic. In the ER, she was found to be hypoxic despit time of clinical interview, the patient is intubated.  She is unresponsive to verbal or noxious stimuli.  She is on propofol for sedation.  She has a known history of substance abuse and stroke.    Hospital Course:    Brief Summary 58 year old past medical history relevant for hypertension admitted on 01/02/2019 with unresponsive episode and acute hypoxia and emergently intubated on 01/02/2019, extubated 01/05/2019, neuro consult appreciated patient most likely had press syndrome, COVID-19 is negative   A/P  1)HTN/PRES syndrome--BP control improving, discharge home on Coreg 3.125 mg twice daily and amlodipine 10 mg daily,  no new neuro symptoms, so deferred neuro imaging at this time   2)HLD-  continue Lipitor 40 mg daily and  Plavix  3)Depression/anxiety--- stable, continue Effexor 75 mg twice daily, Desogen  as prescribed for anxiety  4)Seizure--neurology input appreciated, continue Keppra 1500 mg twice daily, Depakote level is 39, treated with Depakote 750 mg twice daily while in the hospital, per neurologist okay to discontinue Depakote  5) generalized weakness/debility--- PT eval appreciated recommend inpatient rehab, patient declined rehab facility with discharge home with home health physical therapy   Code Status :  Full   Family Communication:   na  Procedures Intubated 01/02/2019,  extubated 01/05/2019  Disposition Plan  : CIR  Consults  :  Neuro/PCCM  Discharge Condition: stable  Follow UP  Follow-up Information    [facility] Follow [facility].   Specialty:  Home Health Services Why:  [phone]          Diet and Activity recommendation:  As  advised  Discharge Instructions    Discharge Instructions    Call MD for:  difficulty breathing, headache or visual disturbances   Complete by:  As directed    Call MD for:  persistant dizziness or light-headedness   Complete by:  As directed    Call MD for:  persistant nausea and vomiting   Complete by:  As directed    Call MD for:  severe uncontrolled pain   Complete by:  As directed    Call MD for:  temperature >100.4   Complete by:  As directed    Diet - low sodium heart healthy   Complete by:  As directed    Discharge instructions   Complete by:  As directed    1) you have refused to go to rehab facility as advised-----a physical therapist will be  coming to your house to help you with your rehab 2) please take medications as prescribed 3) low-salt, low-cholesterol low-fat diet strongly advised 4) you are taking aspirin and Plavix which are blood thinners so Avoid ibuprofen/Advil/Aleve/Motrin/Goody Powders/Naproxen/BC powders/Meloxicam/Diclofenac/Indomethacin and other Nonsteroidal anti-inflammatory medications as these will make you more likely to bleed and can cause stomach ulcers, can also cause Kidney problems.  5) keep a diary of her blood pressure check----follow-up to primary care physician in 1 to 2 weeks for recheck and reevaluation   Increase activity slowly   Complete by:  As directed         Discharge Medications     Allergies as of 01/08/2019   No Known Allergies     Medication List    STOP taking these medications   aspirin 325 MG tablet Replaced by:  aspirin EC 81 MG tablet   oxyCODONE 15 MG immediate release tablet Commonly known as:  ROXICODONE     TAKE these medications   albuterol 108 (90 Base) MCG/ACT inhaler Commonly known as:  ProAir HFA Inhale 2 puffs into the lungs every 6 (six) hours as needed for wheezing or shortness of breath. Dx..J44.1 What changed:  additional instructions   amLODipine 10 MG tablet Commonly known as:   NORVASC Take 1 tablet (10 mg total) by mouth daily. What changed:  when to take this   aspirin EC 81 MG tablet Take 1 tablet (81 mg total) by mouth daily with breakfast. Replaces:  aspirin 325 MG tablet   atorvastatin 40 MG tablet Commonly known as:  Lipitor Take 1 tablet (40 mg total) by mouth daily. What changed:  when to take this   Baclofen 5 MG Tabs Take 5 mg by mouth 2 (two) times daily. What changed:    medication strength  how much to take  when to take this   carvedilol  3.125 MG tablet Commonly known as:  COREG Take 1 tablet (3.125 mg total) by mouth 2 (two) times daily with a meal.   clopidogrel 75 MG tablet Commonly known as:  PLAVIX Take 1 tablet (75 mg total) by mouth daily. Start taking on:  January 09, 2019   famotidine 20 MG tablet Commonly known as:  PEPCID Take 1 tablet (20 mg total) by mouth daily. What changed:    medication strength  how much to take  when to take this   folic acid 1 MG tablet Commonly known as:  FOLVITE Take 1 tablet (1 mg total) by mouth daily. Start taking on:  January 09, 2019 What changed:  Another medication with the same name was removed. Continue taking this medication, and follow the directions you see here.   gabapentin 300 MG capsule Commonly known as:  NEURONTIN Take 300 mg by mouth daily after breakfast.   hydrOXYzine 25 MG tablet Commonly known as:  ATARAX/VISTARIL Take 1 tablet (25 mg total) by mouth 3 (three) times daily as needed for itching, anxiety or nausea. What changed:    medication strength  how much to take  how to take this  when to take this  reasons to take this  additional instructions   levETIRAcetam 500 MG tablet Commonly known as:  KEPPRA Take 1 tablet (500 mg total) by mouth 2 (two) times daily. What changed:  Another medication with the same name was added. Make sure you understand how and when to take each.   levETIRAcetam 750 MG tablet Commonly known as:  KEPPRA Take 2  tablets (1,500 mg total) by mouth 2 (two) times daily. What changed:  You were already taking a medication with the same name, and this prescription was added. Make sure you understand how and when to take each.   multivitamin with minerals Tabs tablet Take 1 tablet by mouth daily. What changed:  additional instructions   nicotine 14 mg/24hr patch Commonly known as:  NICODERM CQ - dosed in mg/24 hours Place 1 patch (14 mg total) onto the skin daily. Start taking on:  January 09, 2019   polyethylene glycol 17 g packet Commonly known as:  MIRALAX / GLYCOLAX Take 17 g by mouth daily as needed for moderate constipation or severe constipation.   Potassium Chloride ER 20 MEQ Tbcr Take 20 mEq by mouth daily for 4 days. 1 tab daily by mouth   thiamine 100 MG tablet Take 1 tablet (100 mg total) by mouth daily. Start taking on:  January 09, 2019   venlafaxine XR 150 MG 24 hr capsule Commonly known as:  EFFEXOR-XR Take 1 capsule (150 mg total) by mouth daily with breakfast.   vitamin B-12 1000 MCG tablet Commonly known as:  CYANOCOBALAMIN Take 1,000 mcg by mouth daily.   Vitamin D (Ergocalciferol) 1.25 MG (50000 UT) Caps capsule Commonly known as:  DRISDOL Take 1 capsule (50,000 Units total) by mouth every Saturday. Start taking on:  January 13, 2019            Durable Medical Equipment  (From admission, onward)         Start     Ordered   01/08/19 1527  For home use only DME 3 n 1  Once     01/08/19 1526          Major procedures and Radiology Reports - PLEASE review detailed and final reports for all details, in brief -   Ct Angio Head W Or Wo  Contrast  Result Date: 01/02/2019 CLINICAL DATA:  58 y/o  F; altered mental status. Abnormal CT. EXAM: CT ANGIOGRAPHY HEAD AND NECK TECHNIQUE: Multidetector CT imaging of the head and neck was performed using the standard protocol during bolus administration of intravenous contrast. Multiplanar CT image reconstructions and MIPs were  obtained to evaluate the vascular anatomy. Carotid stenosis measurements (when applicable) are obtained utilizing NASCET criteria, using the distal internal carotid diameter as the denominator. CONTRAST:  142mL OMNIPAQUE IOHEXOL 350 MG/ML SOLN COMPARISON:  07/14/2018 CT angiogram head and neck. FINDINGS: CTA NECK FINDINGS Aortic arch: Standard branching. Imaged portion shows no evidence of aneurysm or dissection. No significant stenosis of the major arch vessel origins. Aortic atherosclerosis. Right carotid system: Mixed plaque of the carotid bifurcation with proximal ICA moderate 50% stenosis. There are several small plaque ulcerations within the fibrofatty component of the plaque. Left carotid system: 50% stenosis of the left common carotid artery origin with fibrofatty plaque. Chronic left ICA occlusion. 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 Severe right vertebral artery origin stenosis. Skeleton: No acute osseous abnormality. Stable C7, T2, T5 prominent superior endplate Schmorl's nodes. Other neck: There is edema within the soft tissues of the neck and scalp posteriorly. No fluid collection. Upper chest: Negative. Review of the MIP images confirms the above findings CTA HEAD FINDINGS Anterior circulation: Stable occlusion of left internal carotid artery petrous and cavernous segments, patent paraclinoid and terminal segments. Stable moderate left ACA stenosis. No new large vessel occlusion, aneurysm or vascular malformation. Posterior circulation: Stable mild bilateral proximal PCA stenosis. No large vessel occlusion, aneurysm, or vascular malformation. Bilateral V4 segments are patent. Venous sinuses: As permitted by contrast timing, patent. Anatomic variants: None significant. Delayed phase: No abnormal intracranial enhancement. Review of the MIP images confirms the above findings IMPRESSION: CTA neck: 1. Stable chronic left ICA occlusion. 2.  Stable right proximal ICA moderate 50% stenosis with fibrofatty plaque. 3. Stable left common carotid artery origin 50% stenosis with fibrofatty plaque. 4. 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 5. Stable severe right vertebral artery origin stenosis. 6. Edema within the posterior soft tissues of neck and scalp, probably related to being on floor for long duration. CTA head: 1. No new intracranial large vessel occlusion. 2. Stable chronic occlusion of left internal carotid artery petrous and cavernous segments. Patent left paraclinoid and terminal ICA segments. 3. Stable moderate left ACA stenosis versus right dominant system. 4. Stable mild bilateral proximal PCA stenosis. Electronically Signed   By: Kristine Garbe M.D.   On: 01/02/2019 23:24   Ct Head Wo Contrast  Result Date: 01/02/2019 CLINICAL DATA:  Unresponsive and recent fall, initial encounter EXAM: CT HEAD WITHOUT CONTRAST CT CERVICAL SPINE WITHOUT CONTRAST TECHNIQUE: Multidetector CT imaging of the head and cervical spine was performed following the standard protocol without intravenous contrast. Multiplanar CT image reconstructions of the cervical spine were also generated. COMPARISON:  10/10/2018, 07/14/2018 FINDINGS: CT HEAD FINDINGS Brain: Diffuse edema is noted within the right cerebral hemisphere with sulcal blunting and mild mass effect from right to left of approximately 3 mm. Hyperdense MCA is noted on the right consistent with thrombosis chronic ischemic changes are again seen in the left cerebellar hemisphere. 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 No acute hemorrhage is seen. Vascular: Hyperdense right MCA is noted. Skull: Skull is intact. Sinuses/Orbits: Paranasal sinuses are within normal limits. The orbits and their contents are unremarkable. Other: Scalp hematoma is  noted posteriorly near the vertex. CT CERVICAL SPINE FINDINGS Alignment: Mild  straightening of the normal cervical lordosis is noted. Skull base and vertebrae: 7 cervical segments are well visualized. Stable superior endplate deformity is noted at C7 and T2 similar to that noted on a prior exam from 07/14/2018. No acute fracture or acute facet abnormality is noted. The odontoid is within normal limits. Mild facet hypertrophic changes are seen. Soft tissues and spinal canal: Surrounding soft tissues demonstrates some subcutaneous edema along the musculature of the posterior neck on the right this may be related to the recent injury. No focal hematoma is noted. Endotracheal tube and gastric catheter are noted. No other soft tissue abnormality is noted. Upper chest: Mild emphysematous changes are seen. Other: None IMPRESSION: CT of the head: Diffuse edema within the right cerebral hemisphere particularly in the distribution of the right middle cerebral artery consistent with acute ischemia. This is superimposed over chronic ischemic changes and mild midline shift from right to left is noted of approximately 3 mm. No definitive mass lesion is noted. Changes of mild hyperdense right MCA are noted. Chronic atrophic and ischemic changes stable from the previous exam. Posterior scalp hematoma near the vertex. CT of the cervical spine: Degenerative and chronic changes stable from the previous exam. No acute bony abnormality noted. Soft tissue changes in the musculature of the posterior neck on the right likely related to recent fall. Critical Value/emergent results were called by telephone at the time of interpretation on 01/02/2019 at 8:39 pm to [facility] , who verbally acknowledged these results. Electronically Signed   By: Inez Catalina M.D.   On: 01/02/2019 20:45   Ct Angio Neck W And/or Wo Contrast  Result Date: 01/02/2019 CLINICAL DATA:  58 y/o  F; altered mental status. Abnormal CT. EXAM: CT ANGIOGRAPHY HEAD AND NECK TECHNIQUE: Multidetector CT imaging of the head and neck was  performed using the standard protocol during bolus administration of intravenous contrast. Multiplanar CT image reconstructions and MIPs were obtained to evaluate the vascular anatomy. Carotid stenosis measurements (when applicable) are obtained utilizing NASCET criteria, using the distal internal carotid diameter as the denominator. CONTRAST:  190mL OMNIPAQUE IOHEXOL 350 MG/ML SOLN COMPARISON:  07/14/2018 CT angiogram head and neck. FINDINGS: CTA NECK FINDINGS Aortic arch: Standard branching. Imaged portion shows no evidence of aneurysm or dissection. No significant stenosis of the major arch vessel origins. Aortic atherosclerosis. Right carotid system: Mixed plaque of the carotid bifurcation with proximal ICA moderate 50% stenosis. There are several small plaque ulcerations within the fibrofatty component of the plaque. Left carotid system: 50% stenosis of the left common carotid artery origin with fibrofatty plaque. Chronic left ICA occlusion. 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 Severe right vertebral artery origin stenosis. Skeleton: No acute osseous abnormality. Stable C7, T2, T5 prominent superior endplate Schmorl's nodes. Other neck: There is edema within the soft tissues of the neck and scalp posteriorly. No fluid collection. Upper chest: Negative. Review of the MIP images confirms the above findings CTA HEAD FINDINGS Anterior circulation: Stable occlusion of left internal carotid artery petrous and cavernous segments, patent paraclinoid and terminal segments. Stable moderate left ACA stenosis. No new large vessel occlusion, aneurysm or vascular malformation. Posterior circulation: Stable mild bilateral proximal PCA stenosis. No large vessel occlusion, aneurysm, or vascular malformation. Bilateral V4 segments are patent. Venous sinuses: As permitted by contrast timing, patent. Anatomic variants: None significant. Delayed phase: No abnormal  intracranial enhancement. Review of the MIP  images confirms the above findings IMPRESSION: CTA neck: 1. Stable chronic left ICA occlusion. 2. Stable right proximal ICA moderate 50% stenosis with fibrofatty plaque. 3. Stable left common carotid artery origin 50% stenosis with fibrofatty plaque. 4. 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 5. Stable severe right vertebral artery origin stenosis. 6. Edema within the posterior soft tissues of neck and scalp, probably related to being on floor for long duration. CTA head: 1. No new intracranial large vessel occlusion. 2. Stable chronic occlusion of left internal carotid artery petrous and cavernous segments. Patent left paraclinoid and terminal ICA segments. 3. Stable moderate left ACA stenosis versus right dominant system. 4. Stable mild bilateral proximal PCA stenosis. Electronically Signed   By: Kristine Garbe M.D.   On: 01/02/2019 23:24   Ct Cervical Spine Wo Contrast  Result Date: 01/02/2019 CLINICAL DATA:  Unresponsive and recent fall, initial encounter EXAM: CT HEAD WITHOUT CONTRAST CT CERVICAL SPINE WITHOUT CONTRAST TECHNIQUE: Multidetector CT imaging of the head and cervical spine was performed following the standard protocol without intravenous contrast. Multiplanar CT image reconstructions of the cervical spine were also generated. COMPARISON:  10/10/2018, 07/14/2018 FINDINGS: CT HEAD FINDINGS Brain: Diffuse edema is noted within the right cerebral hemisphere with sulcal blunting and mild mass effect from right to left of approximately 3 mm. Hyperdense MCA is noted on the right consistent with thrombosis chronic ischemic changes are again seen in the left cerebellar hemisphere. 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 No acute hemorrhage is seen. Vascular: Hyperdense right MCA is noted. Skull: Skull is intact. Sinuses/Orbits: Paranasal sinuses are within normal limits. The orbits  and their contents are unremarkable. Other: Scalp hematoma is noted posteriorly near the vertex. CT CERVICAL SPINE FINDINGS Alignment: Mild straightening of the normal cervical lordosis is noted. Skull base and vertebrae: 7 cervical segments are well visualized. Stable superior endplate deformity is noted at C7 and T2 similar to that noted on a prior exam from 07/14/2018. No acute fracture or acute facet abnormality is noted. The odontoid is within normal limits. Mild facet hypertrophic changes are seen. Soft tissues and spinal canal: Surrounding soft tissues demonstrates some subcutaneous edema along the musculature of the posterior neck on the right this may be related to the recent injury. No focal hematoma is noted. Endotracheal tube and gastric catheter are noted. No other soft tissue abnormality is noted. Upper chest: Mild emphysematous changes are seen. Other: None IMPRESSION: CT of the head: Diffuse edema within the right cerebral hemisphere particularly in the distribution of the right middle cerebral artery consistent with acute ischemia. This is superimposed over chronic ischemic changes and mild midline shift from right to left is noted of approximately 3 mm. No definitive mass lesion is noted. Changes of mild hyperdense right MCA are noted. Chronic atrophic and ischemic changes stable from the previous exam. Posterior scalp hematoma near the vertex. CT of the cervical spine: Degenerative and chronic changes stable from the previous exam. No acute bony abnormality noted. Soft tissue changes in the musculature of the posterior neck on the right likely related to recent fall. Critical Value/emergent results were called by telephone at the time of interpretation on 01/02/2019 at 8:39 pm to [facility], [facility] , who verbally acknowledged these results. Electronically Signed   By: Inez Catalina M.D.   On: 01/02/2019 20:45   Mr Brain Wo Contrast  Result Date: 01/03/2019 CLINICAL DATA:  Unresponsive.  Hypertensive on presentation. Febrile. History of substance abuse.  EXAM: MRI HEAD WITHOUT CONTRAST TECHNIQUE: Multiplanar, multiecho pulse sequences of the brain and surrounding structures were obtained without intravenous contrast. COMPARISON:  Head CT 01/02/2019 and MRI 10/10/2018 FINDINGS: Some sequences are moderately motion degraded. Brain: There is marked T2 hyperintensity/edema throughout the majority of the white matter in the right cerebral hemisphere, particularly posteriorly and at the vertex. There is some cortical involvement as well as right basal ganglia involvement. There is associated mild cortical diffusion diffusion abnormality of which the majority does not clearly demonstrate substantially reduced ADC. A 3 mm focus of clearly reduced ADC is noted at the right frontotemporal junction. A small amount of cortical diffusion abnormality is also present in the medial left frontal lobe. Milder T2 hyperintensity is present in left cerebral hemispheric white matter, mainly in the occipital and posterior temporal lobes. There is also extensive, relatively symmetric T2 hyperintensity in the cerebellum bilaterally and moderate T2 hyperintensity in the pons. The thalami and internal capsules are also involved bilaterally. There is partial effacement of the right lateral ventricle without significant midline shift. They are is no tonsillar herniation. Numerous small chronic infarcts are again seen in the cerebellum bilaterally and in the superior aspect of the left thalamus. Chronic watershed infarcts are again seen in the left frontal and parietal lobes, and small chronic infarcts are again seen in the right frontal and right parietal lobes. Small volume hemosiderin deposition is noted with some of the chronic infarcts, and there is also evidence of remote subarachnoid hemorrhage at the left frontal vertex. There is no extra-axial fluid collection. Vascular: Abnormal appearance of the left internal carotid  artery related to known occlusion. Skull and upper cervical spine: Unremarkable bone marrow signal. Sinuses/Orbits: Rightward gaze. Paranasal sinuses and mastoid air cells are clear. Other: None. IMPRESSION: 1. Extensive T2 signal abnormality throughout the right greater than left cerebral hemispheres and bilateral cerebellum affecting white matter greater than gray matter. Considerations include atypical posterior reversible encephalopathy syndrome, infectious encephalitis, and toxic leukoencephalopathy (such as from drug abuse). 2. Mild cortical diffusion abnormality predominantly in the right cerebral hemisphere, largely without reduced ADC. Query seizure activity. 3. Multiple chronic infarcts as above. Electronically Signed   By: Logan Bores M.D.   On: 01/03/2019 13:36   Dg Chest Port 1 View  Result Date: 01/03/2019 CLINICAL DATA:  Intubation EXAM: PORTABLE CHEST 1 VIEW COMPARISON:  01/02/2019 FINDINGS: Endotracheal tube tip is 4 cm above the inferior margin of the clavicle. Enteric tube courses beyond the field of view. The lungs are clear. Normal cardiomediastinal contours. IMPRESSION: Radiographically appropriate position of endotracheal tube. Electronically Signed   By: Ulyses Jarred M.D.   On: 01/03/2019 17:28   Dg Chest Portable 1 View  Result Date: 01/02/2019 CLINICAL DATA:  Status post intubation EXAM: PORTABLE CHEST 1 VIEW COMPARISON:  Film from earlier in the same day. FINDINGS: Cardiac shadows within normal limits. Endotracheal tube and gastric catheter are noted in satisfactory position. Increased right basilar atelectasis is noted projecting in the lower lobes is new from the prior exam. No bony abnormality is noted. IMPRESSION: Right lower lobe consolidation. Electronically Signed   By: Inez Catalina M.D.   On: 01/02/2019 21:08   Dg Chest Port 1 View  Result Date: 01/02/2019 CLINICAL DATA:  Shortness of breath EXAM: PORTABLE CHEST 1 VIEW COMPARISON:  07/13/2018 FINDINGS: The heart size  and mediastinal contours are within normal limits. Both lungs are clear. The visualized skeletal structures are unremarkable. IMPRESSION: No active disease. Electronically Signed   By:  Ulyses Jarred M.D.   On: 01/02/2019 20:32    Micro Results     Recent Results (from the past 240 hour(s))  SARS Coronavirus 2 [facility] order, Performed in [facility] hospital lab)     Status: None   Collection Time: 01/02/19  7:40 PM  Result Value Ref Range Status   SARS Coronavirus 2 NEGATIVE NEGATIVE Final    Comment: (NOTE) If result is NEGATIVE SARS-CoV-2 target nucleic acids are NOT DETECTED. The SARS-CoV-2 RNA is generally detectable in upper and lower  respiratory specimens during the acute phase of infection. The lowest  concentration of SARS-CoV-2 viral copies this assay can detect is 250  copies / mL. A negative result does not preclude SARS-CoV-2 infection  and should not be used as the sole basis for treatment or other  patient management decisions.  A negative result may occur with  improper specimen collection / handling, submission of specimen other  than nasopharyngeal swab, presence of viral mutation(s) within the  areas targeted by this assay, and inadequate number of viral copies  (<250 copies / mL). A negative result must be combined with clinical  observations, patient history, and epidemiological information. If result is POSITIVE SARS-CoV-2 target nucleic acids are DETECTED. The SARS-CoV-2 RNA is generally detectable in upper and lower  respiratory specimens dur ing the acute phase of infection.  Positive  results are indicative of active infection with SARS-CoV-2.  Clinical  correlation with patient history and other diagnostic information is  necessary to determine patient infection status.  Positive results do  not rule out bacterial infection or co-infection with other viruses. If result is PRESUMPTIVE POSTIVE SARS-CoV-2 nucleic acids MAY BE PRESENT.   A presumptive  positive result was obtained on the submitted specimen  and confirmed on repeat testing.  While 2019 novel coronavirus  (SARS-CoV-2) nucleic acids may be present in the submitted sample  additional confirmatory testing may be necessary for epidemiological  and / or clinical management purposes  to differentiate between  SARS-CoV-2 and other Sarbecovirus currently known to infect humans.  If clinically indicated additional testing with an alternate test  methodology [phone]) is advised. The SARS-CoV-2 RNA is generally  detectable in upper and lower respiratory sp ecimens during the acute  phase of infection. The expected result is Negative. Fact Sheet for Patients:  StrictlyIdeas.no Fact Sheet for Healthcare Providers: BankingDealers.co.za This test is not yet approved or cleared by the [place] FDA and has been authorized for detection and/or diagnosis of SARS-CoV-2 by FDA under an Emergency Use Authorization (EUA).  This EUA will remain in effect (meaning this test can be used) for the duration of the COVID-19 declaration under Section 564(b)(1) of the Act, 21 U.S.C. section 360bbb-3(b)(1), unless the authorization is terminated or revoked sooner. Performed at [facility], [place] [address] [place], [place] [address]   Blood Culture (routine x 2)     Status: Abnormal   Collection Time: 01/02/19  7:43 PM  Result Value Ref Range Status   Specimen Description BLOOD LEFT ARM  Final   Special Requests   Final    BOTTLES DRAWN AEROBIC AND ANAEROBIC Blood Culture adequate volume   Culture  Setup Time   Final    GRAM POSITIVE COCCI IN CLUSTERS IN BOTH AEROBIC AND ANAEROBIC BOTTLES CRITICAL RESULT CALLED TO, READ BACK BY AND VERIFIED WITH: Warrick Parisian PharmD 17:35 01/03/19 ([email or web address])    Culture (A)  Final    STAPHYLOCOCCUS SPECIES (COAGULASE NEGATIVE) THE SIGNIFICANCE OF  ISOLATING THIS ORGANISM FROM A SINGLE SET OF BLOOD CULTURES WHEN  MULTIPLE SETS ARE DRAWN IS UNCERTAIN. PLEASE NOTIFY THE MICROBIOLOGY DEPARTMENT WITHIN ONE WEEK IF SPECIATION AND SENSITIVITIES ARE REQUIRED. Performed at [facility], [place] [address], [place], [place] [address]    Report Status 01/05/2019 FINAL  Final  Blood Culture ID Panel (Reflexed)     Status: Abnormal   Collection Time: 01/02/19  7:43 PM  Result Value Ref Range Status   Enterococcus species NOT DETECTED NOT DETECTED Final   Listeria monocytogenes NOT DETECTED NOT DETECTED Final   Staphylococcus species DETECTED (A) NOT DETECTED Final    Comment: Methicillin (oxacillin) susceptible coagulase negative staphylococcus. Possible blood culture contaminant (unless isolated from more than one blood culture draw or clinical case suggests pathogenicity). No antibiotic treatment is indicated for blood  culture contaminants. CRITICAL RESULT CALLED TO, READ BACK BY AND VERIFIED WITH: Warrick Parisian PharmD 17:35 01/03/19 ([email or web address])    Staphylococcus aureus (BCID) NOT DETECTED NOT DETECTED Final   Methicillin resistance NOT DETECTED NOT DETECTED Final   Streptococcus species NOT DETECTED NOT DETECTED Final   Streptococcus agalactiae NOT DETECTED NOT DETECTED Final   Streptococcus pneumoniae NOT DETECTED NOT DETECTED Final   Streptococcus pyogenes NOT DETECTED NOT DETECTED Final   Acinetobacter baumannii NOT DETECTED NOT DETECTED Final   Enterobacteriaceae species NOT DETECTED NOT DETECTED Final   Enterobacter cloacae complex NOT DETECTED NOT DETECTED Final   Escherichia coli NOT DETECTED NOT DETECTED Final   Klebsiella oxytoca NOT DETECTED NOT DETECTED Final   Klebsiella pneumoniae NOT DETECTED NOT DETECTED Final   Proteus species NOT DETECTED NOT DETECTED Final   Serratia marcescens NOT DETECTED NOT DETECTED Final   Haemophilus influenzae NOT DETECTED NOT DETECTED Final   Neisseria meningitidis NOT DETECTED NOT DETECTED Final   Pseudomonas aeruginosa NOT DETECTED NOT DETECTED Final   Candida  albicans NOT DETECTED NOT DETECTED Final   Candida glabrata NOT DETECTED NOT DETECTED Final   Candida krusei NOT DETECTED NOT DETECTED Final   Candida parapsilosis NOT DETECTED NOT DETECTED Final   Candida tropicalis NOT DETECTED NOT DETECTED Final    Comment: Performed at [facility], [address], [place], [place] [address]  Blood Culture (routine x 2)     Status: None   Collection Time: 01/02/19  8:00 PM  Result Value Ref Range Status   Specimen Description BLOOD LEFT HAND  Final   Special Requests   Final    BOTTLES DRAWN AEROBIC ONLY Blood Culture results may not be optimal due to an inadequate volume of blood received in culture bottles   Culture   Final    NO GROWTH 5 DAYS Performed at [facility], [place] [address] [place], [place] [address]    Report Status 01/07/2019 FINAL  Final  Culture, blood (routine x 2)     Status: None   Collection Time: 01/03/19 12:15 AM  Result Value Ref Range Status   Specimen Description BLOOD RIGHT ANTECUBITAL  Final   Special Requests   Final    BOTTLES DRAWN AEROBIC AND ANAEROBIC Blood Culture results may not be optimal due to an inadequate volume of blood received in culture bottles   Culture   Final    NO GROWTH 5 DAYS Performed at [facility], [place] [address], [place], [place] [address]    Report Status 01/08/2019 FINAL  Final  Culture, blood (routine x 2)     Status: None   Collection Time:  01/03/19 12:37 AM  Result Value Ref Range Status   Specimen Description BLOOD RIGHT HAND  Final   Special Requests   Final    BOTTLES DRAWN AEROBIC AND ANAEROBIC Blood Culture adequate volume   Culture   Final    NO GROWTH 5 DAYS Performed at [facility], [address] [place], [place] [address]    Report Status 01/08/2019 FINAL  Final  MRSA PCR Screening     Status: None   Collection Time: 01/03/19  2:24 AM  Result Value Ref Range Status   MRSA by PCR NEGATIVE NEGATIVE Final    Comment:        The GeneXpert MRSA  Assay (FDA approved for NASAL specimens only), is one component of a comprehensive MRSA colonization surveillance program. It is not intended to diagnose MRSA infection nor to guide or monitor treatment for MRSA infections. Performed at [facility], [place] [address], [place], [place] [address]   Culture, respiratory (tracheal aspirate)     Status: None   Collection Time: 01/03/19  2:27 AM  Result Value Ref Range Status   Specimen Description TRACHEAL ASPIRATE  Final   Special Requests NONE  Final   Gram Stain   Final    MODERATE WBC PRESENT, PREDOMINANTLY PMN RARE GRAM POSITIVE COCCI    Culture   Final    Consistent with normal respiratory flora. Performed at [facility], [place] [address], [place], [place] [address]    Report Status 01/05/2019 FINAL  Final  Urine culture     Status: None   Collection Time: 01/03/19  2:39 AM  Result Value Ref Range Status   Specimen Description URINE, CATHETERIZED  Final   Special Requests NONE  Final   Culture   Final    NO GROWTH Performed at [facility], [place] [address] [place], [place] [address]    Report Status 01/04/2019 FINAL  Final       Today   Subjective    Kelsey Wade today has no new complaints          Patient has been seen and examined prior to discharge   Objective   Blood pressure (!) 138/92, pulse 94, temperature 98.4 F (36.9 C), temperature source Oral, resp. rate 19, height 5\' 5"  (1.651 m), weight 65.1 kg, SpO2 99 %.   Intake/Output Summary (Last 24 hours) at 01/08/2019 1759 Last data filed at 01/08/2019 0440 Gross per 24 hour  Intake --  Output 1400 ml  Net -1400 ml    Exam Gen:- Awake Alert,  In no apparent distress  HEENT:- [place].AT, No sclera icterus Neck-Supple Neck,No JVD,.  Lungs-  CTAB , fair symmetrical air movement CV- S1, S2 normal, regular  Abd-  +ve B.Sounds, Abd Soft, No tenderness,    Extremity/Skin:- No  edema, pedal pulses present  Psych-affect is appropriate,  oriented x3, patient is lucid and coherent Neuro-generalized weakness, ,  no new focal deficits, no tremors   Data Review   CBC w Diff:  Lab Results  Component Value Date   WBC 10.6 (H) 01/04/2019   HGB 12.1 01/04/2019   HCT 38.9 01/04/2019   HCT 36.8 07/14/2018   PLT 249 01/04/2019   LYMPHOPCT 8 01/02/2019   MONOPCT 7 01/02/2019   EOSPCT 0 01/02/2019   BASOPCT 0 01/02/2019    CMP:  Lab Results  Component Value Date   NA 141 01/08/2019   K 2.9 (L) 01/08/2019   CL 108  01/08/2019   CO2 22 01/08/2019   BUN 14 01/08/2019   CREATININE 0.61 01/08/2019   CREATININE 0.88 06/10/2017   PROT 8.0 01/02/2019   ALBUMIN 3.8 01/02/2019   BILITOT 0.5 01/02/2019   ALKPHOS 111 01/02/2019   AST 44 (H) 01/02/2019   ALT 18 01/02/2019  .   Total Discharge time is about 33 minutes  Roxan Hockey M.D on 01/08/2019 at 5:59 PM  Go to [email or web address] -  for contact info  [facility] - Office  [phone]

## 2019-01-08 NOTE — TOC Initial Note (Signed)
Transition of Care [facility]) - Initial/Assessment Note    Patient Details  Name: Kelsey Wade MRN: [id number] Date of Birth: 16-Nov-1960  [name]) CM/SW Contact:    Kelsey Friar, RN Phone Number: 01/08/2019, 4:04 PM  Clinical Narrative:                   Expected Discharge Plan: IP Rehab Facility Barriers to Discharge: No Barriers Identified   Patient Goals and CMS Choice Patient states their goals for this hospitalization and ongoing recovery are:: to be home with my husband CMS Medicare.gov Compare Post Acute Care list provided to:: Patient Choice offered to / list presented to : Patient  Expected Discharge Plan and Services Expected Discharge Plan: [name]   Discharge Planning Services: CM Consult Post Acute Care Choice: Home Health, Durable Medical Equipment Living arrangements for the past 2 months: Single Family Home Expected Discharge Date: 01/08/19               DME Arranged: 3-N-1 DME Agency: [facility] HH Arranged: RN, PT, OT HH Agency: [facility] ([place])  Prior Living Arrangements/Services Living arrangements for the past 2 months: Single Family Home Lives with:: Spouse   Do you feel safe going back to the place where you live?: Yes      Need for Family Participation in Patient Care: Yes (Comment)(24 hour supervision) Care giver support system in place?: Yes (comment)(spouse can provide) Current home services: DME(walker)    Activities of Daily Living      Permission Sought/Granted                  Emotional Assessment Appearance:: Appears older than stated age Attitude/Demeanor/Rapport: Engaged Affect (typically observed): Accepting, Appropriate, Pleasant Orientation: : Oriented to Place, Oriented to Self, Oriented to  Time, Oriented to Situation Alcohol / Substance Use: Illicit Drugs, Tobacco Use Psych Involvement: No (comment)  Admission diagnosis:  unresponsive Patient Active Problem List   Diagnosis Date Noted  . PRES (posterior reversible encephalopathy syndrome) 01/07/2019  . Lactic acidosis 01/03/2019  . Elevated troponin 01/03/2019  . Acute hypoxemic respiratory failure ([place]) 01/03/2019  . Tachycardia 01/03/2019  . Stroke (cerebrum) ([place]) 07/13/2018  . AKI (acute kidney injury) ([place]) 01/04/2018  . Toxic metabolic encephalopathy 93/73/4287  . Alcohol withdrawal ([place]) 01/04/2018  . Cerebral edema ([place]) 01/01/2018  . 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 Left carotid artery occlusion   . CVA (cerebrovascular accident) ([place]) 03/02/2017  . Chronic pain 03/02/2017  . ETOH abuse 03/02/2017  . Ischemic stroke ([place])   . Hyperlipidemia LDL goal <100 11/15/2016  . HTN (hypertension) 11/12/2016  . Neuropathy 11/12/2016  . Tobacco dependence 11/12/2016   PCP:  Kelsey Paddy, FNP Pharmacy:   [facility], [place] [address] [place] [address] Phone: [phone] Fax: [phone]  [id number] - [place], [place] [address] [place] [address] Phone: [phone] Fax: [phone]     Social Determinants of Health (SDOH) Interventions    Readmission Risk Interventions No flowsheet data found.

## 2019-06-22 ENCOUNTER — Encounter (HOSPITAL_COMMUNITY): Payer: Self-pay | Admitting: *Deleted

## 2019-06-22 ENCOUNTER — Emergency Department (HOSPITAL_COMMUNITY): Payer: Medicaid Other

## 2019-06-22 ENCOUNTER — Inpatient Hospital Stay (HOSPITAL_COMMUNITY)
Admission: EM | Admit: 2019-06-22 | Discharge: 2019-07-22 | DRG: 640 | Disposition: E | Payer: Medicaid Other | Attending: Internal Medicine | Admitting: Internal Medicine

## 2019-06-22 ENCOUNTER — Other Ambulatory Visit: Payer: Self-pay

## 2019-06-22 ENCOUNTER — Inpatient Hospital Stay (HOSPITAL_COMMUNITY): Payer: Medicaid Other

## 2019-06-22 DIAGNOSIS — L89893 Pressure ulcer of other site, stage 3: Secondary | ICD-10-CM | POA: Diagnosis present

## 2019-06-22 DIAGNOSIS — I469 Cardiac arrest, cause unspecified: Secondary | ICD-10-CM | POA: Diagnosis not present

## 2019-06-22 DIAGNOSIS — K5900 Constipation, unspecified: Secondary | ICD-10-CM

## 2019-06-22 DIAGNOSIS — Z4659 Encounter for fitting and adjustment of other gastrointestinal appliance and device: Secondary | ICD-10-CM

## 2019-06-22 DIAGNOSIS — E870 Hyperosmolality and hypernatremia: Secondary | ICD-10-CM | POA: Diagnosis not present

## 2019-06-22 DIAGNOSIS — F419 Anxiety disorder, unspecified: Secondary | ICD-10-CM | POA: Diagnosis present

## 2019-06-22 DIAGNOSIS — N170 Acute kidney failure with tubular necrosis: Secondary | ICD-10-CM | POA: Diagnosis present

## 2019-06-22 DIAGNOSIS — E876 Hypokalemia: Secondary | ICD-10-CM | POA: Diagnosis present

## 2019-06-22 DIAGNOSIS — Z803 Family history of malignant neoplasm of breast: Secondary | ICD-10-CM

## 2019-06-22 DIAGNOSIS — J95811 Postprocedural pneumothorax: Secondary | ICD-10-CM | POA: Diagnosis not present

## 2019-06-22 DIAGNOSIS — Z515 Encounter for palliative care: Secondary | ICD-10-CM | POA: Diagnosis not present

## 2019-06-22 DIAGNOSIS — G928 Other toxic encephalopathy: Secondary | ICD-10-CM | POA: Diagnosis present

## 2019-06-22 DIAGNOSIS — E860 Dehydration: Secondary | ICD-10-CM | POA: Diagnosis present

## 2019-06-22 DIAGNOSIS — G40901 Epilepsy, unspecified, not intractable, with status epilepticus: Secondary | ICD-10-CM

## 2019-06-22 DIAGNOSIS — J939 Pneumothorax, unspecified: Secondary | ICD-10-CM | POA: Diagnosis not present

## 2019-06-22 DIAGNOSIS — Z20828 Contact with and (suspected) exposure to other viral communicable diseases: Secondary | ICD-10-CM | POA: Diagnosis present

## 2019-06-22 DIAGNOSIS — M549 Dorsalgia, unspecified: Secondary | ICD-10-CM | POA: Diagnosis present

## 2019-06-22 DIAGNOSIS — F329 Major depressive disorder, single episode, unspecified: Secondary | ICD-10-CM | POA: Diagnosis present

## 2019-06-22 DIAGNOSIS — N179 Acute kidney failure, unspecified: Secondary | ICD-10-CM | POA: Diagnosis not present

## 2019-06-22 DIAGNOSIS — Z6824 Body mass index (BMI) 24.0-24.9, adult: Secondary | ICD-10-CM | POA: Diagnosis not present

## 2019-06-22 DIAGNOSIS — Z811 Family history of alcohol abuse and dependence: Secondary | ICD-10-CM

## 2019-06-22 DIAGNOSIS — G936 Cerebral edema: Secondary | ICD-10-CM | POA: Diagnosis present

## 2019-06-22 DIAGNOSIS — R402140 Coma scale, eyes open, spontaneous, unspecified time: Secondary | ICD-10-CM | POA: Diagnosis present

## 2019-06-22 DIAGNOSIS — Z7982 Long term (current) use of aspirin: Secondary | ICD-10-CM

## 2019-06-22 DIAGNOSIS — H02401 Unspecified ptosis of right eyelid: Secondary | ICD-10-CM | POA: Diagnosis present

## 2019-06-22 DIAGNOSIS — Z9911 Dependence on respirator [ventilator] status: Secondary | ICD-10-CM | POA: Diagnosis not present

## 2019-06-22 DIAGNOSIS — F05 Delirium due to known physiological condition: Secondary | ICD-10-CM | POA: Diagnosis present

## 2019-06-22 DIAGNOSIS — G8929 Other chronic pain: Secondary | ICD-10-CM | POA: Diagnosis present

## 2019-06-22 DIAGNOSIS — R402250 Coma scale, best verbal response, oriented, unspecified time: Secondary | ICD-10-CM | POA: Diagnosis present

## 2019-06-22 DIAGNOSIS — G40101 Localization-related (focal) (partial) symptomatic epilepsy and epileptic syndromes with simple partial seizures, not intractable, with status epilepticus: Secondary | ICD-10-CM | POA: Diagnosis present

## 2019-06-22 DIAGNOSIS — G894 Chronic pain syndrome: Secondary | ICD-10-CM | POA: Diagnosis not present

## 2019-06-22 DIAGNOSIS — Y92009 Unspecified place in unspecified non-institutional (private) residence as the place of occurrence of the external cause: Secondary | ICD-10-CM | POA: Diagnosis not present

## 2019-06-22 DIAGNOSIS — G629 Polyneuropathy, unspecified: Secondary | ICD-10-CM | POA: Diagnosis present

## 2019-06-22 DIAGNOSIS — R64 Cachexia: Secondary | ICD-10-CM | POA: Diagnosis present

## 2019-06-22 DIAGNOSIS — I10 Essential (primary) hypertension: Secondary | ICD-10-CM | POA: Diagnosis present

## 2019-06-22 DIAGNOSIS — G9340 Encephalopathy, unspecified: Secondary | ICD-10-CM | POA: Diagnosis not present

## 2019-06-22 DIAGNOSIS — R402360 Coma scale, best motor response, obeys commands, unspecified time: Secondary | ICD-10-CM | POA: Diagnosis present

## 2019-06-22 DIAGNOSIS — R230 Cyanosis: Secondary | ICD-10-CM | POA: Diagnosis not present

## 2019-06-22 DIAGNOSIS — E785 Hyperlipidemia, unspecified: Secondary | ICD-10-CM | POA: Diagnosis present

## 2019-06-22 DIAGNOSIS — Z452 Encounter for adjustment and management of vascular access device: Secondary | ICD-10-CM

## 2019-06-22 DIAGNOSIS — Z9289 Personal history of other medical treatment: Secondary | ICD-10-CM

## 2019-06-22 DIAGNOSIS — K219 Gastro-esophageal reflux disease without esophagitis: Secondary | ICD-10-CM | POA: Diagnosis present

## 2019-06-22 DIAGNOSIS — E861 Hypovolemia: Secondary | ICD-10-CM | POA: Diagnosis present

## 2019-06-22 DIAGNOSIS — F1010 Alcohol abuse, uncomplicated: Secondary | ICD-10-CM | POA: Diagnosis not present

## 2019-06-22 DIAGNOSIS — L89150 Pressure ulcer of sacral region, unstageable: Secondary | ICD-10-CM | POA: Diagnosis present

## 2019-06-22 DIAGNOSIS — J9601 Acute respiratory failure with hypoxia: Secondary | ICD-10-CM | POA: Diagnosis not present

## 2019-06-22 DIAGNOSIS — I69951 Hemiplegia and hemiparesis following unspecified cerebrovascular disease affecting right dominant side: Secondary | ICD-10-CM | POA: Diagnosis not present

## 2019-06-22 DIAGNOSIS — I959 Hypotension, unspecified: Secondary | ICD-10-CM | POA: Diagnosis present

## 2019-06-22 DIAGNOSIS — M1990 Unspecified osteoarthritis, unspecified site: Secondary | ICD-10-CM | POA: Diagnosis present

## 2019-06-22 DIAGNOSIS — L89112 Pressure ulcer of right upper back, stage 2: Secondary | ICD-10-CM | POA: Diagnosis present

## 2019-06-22 DIAGNOSIS — W06XXXA Fall from bed, initial encounter: Secondary | ICD-10-CM | POA: Diagnosis present

## 2019-06-22 DIAGNOSIS — Z91128 Patient's intentional underdosing of medication regimen for other reason: Secondary | ICD-10-CM

## 2019-06-22 DIAGNOSIS — F17210 Nicotine dependence, cigarettes, uncomplicated: Secondary | ICD-10-CM | POA: Diagnosis present

## 2019-06-22 DIAGNOSIS — R579 Shock, unspecified: Secondary | ICD-10-CM | POA: Diagnosis not present

## 2019-06-22 DIAGNOSIS — R739 Hyperglycemia, unspecified: Secondary | ICD-10-CM | POA: Diagnosis not present

## 2019-06-22 DIAGNOSIS — G92 Toxic encephalopathy: Secondary | ICD-10-CM | POA: Diagnosis not present

## 2019-06-22 DIAGNOSIS — Z79891 Long term (current) use of opiate analgesic: Secondary | ICD-10-CM

## 2019-06-22 DIAGNOSIS — Z7902 Long term (current) use of antithrombotics/antiplatelets: Secondary | ICD-10-CM

## 2019-06-22 DIAGNOSIS — T426X6A Underdosing of other antiepileptic and sedative-hypnotic drugs, initial encounter: Secondary | ICD-10-CM | POA: Diagnosis present

## 2019-06-22 DIAGNOSIS — Z79899 Other long term (current) drug therapy: Secondary | ICD-10-CM

## 2019-06-22 DIAGNOSIS — R569 Unspecified convulsions: Secondary | ICD-10-CM | POA: Diagnosis not present

## 2019-06-22 DIAGNOSIS — E871 Hypo-osmolality and hyponatremia: Secondary | ICD-10-CM | POA: Diagnosis not present

## 2019-06-22 DIAGNOSIS — Z66 Do not resuscitate: Secondary | ICD-10-CM | POA: Diagnosis not present

## 2019-06-22 DIAGNOSIS — L8990 Pressure ulcer of unspecified site, unspecified stage: Secondary | ICD-10-CM | POA: Insufficient documentation

## 2019-06-22 DIAGNOSIS — E875 Hyperkalemia: Secondary | ICD-10-CM | POA: Diagnosis not present

## 2019-06-22 LAB — CBC WITH DIFFERENTIAL/PLATELET
Abs Immature Granulocytes: 0.16 10*3/uL — ABNORMAL HIGH (ref 0.00–0.07)
Basophils Absolute: 0 10*3/uL (ref 0.0–0.1)
Basophils Relative: 0 %
Eosinophils Absolute: 0 10*3/uL (ref 0.0–0.5)
Eosinophils Relative: 0 %
HCT: 61.4 % — ABNORMAL HIGH (ref 36.0–46.0)
Hemoglobin: 19.9 g/dL — ABNORMAL HIGH (ref 12.0–15.0)
Immature Granulocytes: 1 %
Lymphocytes Relative: 11 %
Lymphs Abs: 1.9 10*3/uL (ref 0.7–4.0)
MCH: 28.7 pg (ref 26.0–34.0)
MCHC: 32.4 g/dL (ref 30.0–36.0)
MCV: 88.6 fL (ref 80.0–100.0)
Monocytes Absolute: 1.4 10*3/uL — ABNORMAL HIGH (ref 0.1–1.0)
Monocytes Relative: 8 %
Neutro Abs: 14.6 10*3/uL — ABNORMAL HIGH (ref 1.7–7.7)
Neutrophils Relative %: 80 %
Platelets: 294 10*3/uL (ref 150–400)
RBC: 6.93 MIL/uL — ABNORMAL HIGH (ref 3.87–5.11)
RDW: 17.2 % — ABNORMAL HIGH (ref 11.5–15.5)
WBC: 18.2 10*3/uL — ABNORMAL HIGH (ref 4.0–10.5)
nRBC: 0 % (ref 0.0–0.2)

## 2019-06-22 LAB — COMPREHENSIVE METABOLIC PANEL
ALT: 27 U/L (ref 0–44)
AST: 22 U/L (ref 15–41)
Albumin: 3.7 g/dL (ref 3.5–5.0)
Alkaline Phosphatase: 81 U/L (ref 38–126)
Anion gap: 18 — ABNORMAL HIGH (ref 5–15)
BUN: 112 mg/dL — ABNORMAL HIGH (ref 6–20)
CO2: 18 mmol/L — ABNORMAL LOW (ref 22–32)
Calcium: 9.7 mg/dL (ref 8.9–10.3)
Chloride: 121 mmol/L — ABNORMAL HIGH (ref 98–111)
Creatinine, Ser: 1.8 mg/dL — ABNORMAL HIGH (ref 0.44–1.00)
GFR calc Af Amer: 36 mL/min — ABNORMAL LOW (ref >60)
GFR calc non Af Amer: 31 mL/min — ABNORMAL LOW (ref >60)
Glucose, Bld: 160 mg/dL — ABNORMAL HIGH (ref 70–99)
Potassium: 4.1 mmol/L (ref 3.5–5.1)
Sodium: 157 mmol/L — ABNORMAL HIGH (ref 135–145)
Total Bilirubin: 1.1 mg/dL (ref 0.3–1.2)
Total Protein: 7.7 g/dL (ref 6.5–8.1)

## 2019-06-22 LAB — BASIC METABOLIC PANEL
Anion gap: 14 (ref 5–15)
BUN: 123 mg/dL — ABNORMAL HIGH (ref 6–20)
CO2: 21 mmol/L — ABNORMAL LOW (ref 22–32)
Calcium: 9.6 mg/dL (ref 8.9–10.3)
Chloride: 122 mmol/L — ABNORMAL HIGH (ref 98–111)
Creatinine, Ser: 2.17 mg/dL — ABNORMAL HIGH (ref 0.44–1.00)
GFR calc Af Amer: 29 mL/min — ABNORMAL LOW (ref >60)
GFR calc non Af Amer: 25 mL/min — ABNORMAL LOW (ref >60)
Glucose, Bld: 211 mg/dL — ABNORMAL HIGH (ref 70–99)
Potassium: 3.9 mmol/L (ref 3.5–5.1)
Sodium: 157 mmol/L — ABNORMAL HIGH (ref 135–145)

## 2019-06-22 LAB — LACTIC ACID, PLASMA
Lactic Acid, Venous: 2.6 mmol/L (ref 0.5–1.9)
Lactic Acid, Venous: 1.7 mmol/L (ref 0.5–1.9)

## 2019-06-22 LAB — SARS CORONAVIRUS 2 BY RT PCR (HOSPITAL ORDER, PERFORMED IN ~~LOC~~ HOSPITAL LAB): SARS Coronavirus 2: NEGATIVE

## 2019-06-22 LAB — CBG MONITORING, ED: Glucose-Capillary: 162 mg/dL — ABNORMAL HIGH (ref 70–99)

## 2019-06-22 LAB — CK: Total CK: 192 U/L (ref 38–234)

## 2019-06-22 LAB — PHOSPHORUS: Phosphorus: 4.9 mg/dL — ABNORMAL HIGH (ref 2.5–4.6)

## 2019-06-22 MED ORDER — LORAZEPAM 2 MG/ML IJ SOLN
1.0000 mg | INTRAMUSCULAR | Status: DC | PRN
  Administered 2019-06-22: 4 mg via INTRAVENOUS
  Administered 2019-06-23 (×2): 2 mg via INTRAVENOUS
  Filled 2019-06-22: qty 2

## 2019-06-22 MED ORDER — CLOPIDOGREL BISULFATE 75 MG PO TABS: 75.0000 mg | ORAL_TABLET | Freq: Every day | ORAL | Status: DC

## 2019-06-22 MED ORDER — THIAMINE HCL 100 MG/ML IJ SOLN: 100.0000 mg | Freq: Every day | INTRAMUSCULAR | Status: DC

## 2019-06-22 MED ORDER — FOLIC ACID 1 MG PO TABS: 1.0000 mg | ORAL_TABLET | Freq: Every day | ORAL | Status: DC

## 2019-06-22 MED ORDER — VITAMIN B-1 100 MG PO TABS: 100.0000 mg | ORAL_TABLET | Freq: Every day | ORAL | Status: DC

## 2019-06-22 MED ORDER — SODIUM CHLORIDE 0.9 % IV SOLN: INTRAVENOUS | Status: DC

## 2019-06-22 MED ORDER — ADULT MULTIVITAMIN W/MINERALS CH: 1.0000 | ORAL_TABLET | Freq: Every day | ORAL | Status: DC

## 2019-06-22 MED ORDER — ATORVASTATIN CALCIUM 40 MG PO TABS: 40.0000 mg | ORAL_TABLET | Freq: Every day | ORAL | Status: DC

## 2019-06-22 MED ORDER — CARVEDILOL 3.125 MG PO TABS
3.1250 mg | ORAL_TABLET | Freq: Two times a day (BID) | ORAL | Status: DC
  Filled 2019-06-22: qty 1

## 2019-06-22 MED ORDER — LORAZEPAM 2 MG/ML IJ SOLN: 2.0000 mg | Freq: Once | INTRAMUSCULAR | Status: DC

## 2019-06-22 MED ORDER — ONDANSETRON HCL 4 MG PO TABS: 4.0000 mg | ORAL_TABLET | Freq: Four times a day (QID) | ORAL | Status: DC | PRN

## 2019-06-22 MED ORDER — LEVETIRACETAM IN NACL 1000 MG/100ML IV SOLN
1000.0000 mg | Freq: Once | INTRAVENOUS | Status: AC
  Administered 2019-06-22: 1000 mg via INTRAVENOUS
  Filled 2019-06-22: qty 100

## 2019-06-22 MED ORDER — ONDANSETRON HCL 4 MG/2ML IJ SOLN: 4.0000 mg | Freq: Four times a day (QID) | INTRAMUSCULAR | Status: DC | PRN

## 2019-06-22 MED ORDER — HYDROCODONE-ACETAMINOPHEN 5-325 MG PO TABS: 1.0000 | ORAL_TABLET | ORAL | Status: DC | PRN

## 2019-06-22 MED ORDER — LEVETIRACETAM 500 MG PO TABS
1500.0000 mg | ORAL_TABLET | Freq: Two times a day (BID) | ORAL | Status: DC
  Administered 2019-06-22: 1500 mg via ORAL

## 2019-06-22 MED ORDER — SODIUM CHLORIDE 0.9 % IV BOLUS
1000.0000 mL | Freq: Once | INTRAVENOUS | Status: AC
  Administered 2019-06-22: 1000 mL via INTRAVENOUS

## 2019-06-22 MED ORDER — LORAZEPAM 1 MG PO TABS: 1.0000 mg | ORAL_TABLET | ORAL | Status: DC | PRN

## 2019-06-22 MED ORDER — ALBUTEROL SULFATE HFA 108 (90 BASE) MCG/ACT IN AERS
2.0000 | INHALATION_SPRAY | Freq: Four times a day (QID) | RESPIRATORY_TRACT | Status: DC | PRN
  Filled 2019-06-22: qty 6.7

## 2019-06-22 MED ORDER — AMLODIPINE BESYLATE 10 MG PO TABS: 10.0000 mg | ORAL_TABLET | Freq: Every day | ORAL | Status: DC

## 2019-06-22 MED ORDER — NICOTINE 14 MG/24HR TD PT24
14.0000 mg | MEDICATED_PATCH | Freq: Every day | TRANSDERMAL | Status: DC
  Administered 2019-06-23: 14 mg via TRANSDERMAL
  Filled 2019-06-22: qty 1

## 2019-06-22 MED ORDER — DEXTROSE-NACL 5-0.45 % IV SOLN: INTRAVENOUS | Status: DC

## 2019-06-22 NOTE — ED Notes (Signed)
The pt is bending her arm iv fluid is not going in as needed. Re taped cautioned to keep her arm straight  The pts husband went to a store  ??

## 2019-06-22 NOTE — ED Provider Notes (Signed)
[facility] EMERGENCY DEPARTMENT Provider Note   CSN: [id number] Arrival date & time: 07/02/2019  1545     History   Chief Complaint No chief complaint on file.   HPI Kacia Streifel is a 58 y.o. female.     58 year old female who presents after being on the floor for the past 3 days.  Has a history of CVA and was not able to get off the floor.  States that she rolled out of bed.  Resultant deficits from her prior CVA is right-sided weakness.  Today she notes some new left-sided weakness.  Complains of discomfort to her right buttock where she was lying down on the carpet.  No chest or abdominal discomfort.  According to EMS, patient was attended to by her husband for 3 days but she did not have anything to eat or drink.     Past Medical History:  Diagnosis Date  . Anxiety   . Arthritis   . Chronic back pain   . Depression   . ETOH abuse   . GERD (gastroesophageal reflux disease)   . HTN (hypertension)   . Hyperlipidemia   . Stroke [facility])    June 2018  . Tobacco abuse     Patient Active Problem List   Diagnosis Date Noted  . PRES (posterior reversible encephalopathy syndrome) 01/07/2019  . Lactic acidosis 01/03/2019  . Elevated troponin 01/03/2019  . Acute hypoxemic respiratory failure ([place]) 01/03/2019  . Tachycardia 01/03/2019  . Stroke (cerebrum) ([place]) 07/13/2018  . AKI (acute kidney injury) ([place]) 01/04/2018  . Toxic metabolic encephalopathy [id number]  . Alcohol withdrawal ([place]) 01/04/2018  . Cerebral edema ([place]) 01/01/2018  . Spastic hemiplegia affecting dominant side ([place]) 10/25/2017  . Left carotid artery occlusion   . CVA (cerebrovascular accident) ([place]) 03/02/2017  . Chronic pain 03/02/2017  . ETOH abuse 03/02/2017  . Ischemic stroke ([place])   . Hyperlipidemia LDL goal <100 11/15/2016  . HTN (hypertension) 11/12/2016  . Neuropathy 11/12/2016  . Tobacco dependence 11/12/2016    Past Surgical History:  Procedure Laterality Date  .  APPENDECTOMY       OB History   No obstetric history on file.      Home Medications    Prior to Admission medications   Medication Sig Start Date End Date Taking? Authorizing Provider  albuterol (PROAIR HFA) 108 (90 Base) MCG/ACT inhaler Inhale 2 puffs into the lungs every 6 (six) hours as needed for wheezing or shortness of breath. Dx..J44.1 01/08/19   Roxan Hockey, MD  amLODipine (NORVASC) 10 MG tablet Take 1 tablet (10 mg total) by mouth daily. 01/08/19   Roxan Hockey, MD  aspirin EC 81 MG tablet Take 1 tablet (81 mg total) by mouth daily with breakfast. 01/08/19   Denton Brick, Courage, MD  atorvastatin (LIPITOR) 40 MG tablet Take 1 tablet (40 mg total) by mouth daily. 01/08/19 01/08/20  Roxan Hockey, MD  baclofen 5 MG TABS Take 5 mg by mouth 2 (two) times daily. 01/08/19   Roxan Hockey, MD  carvedilol (COREG) 3.125 MG tablet Take 1 tablet (3.125 mg total) by mouth 2 (two) times daily with a meal. 01/08/19   Emokpae, Courage, MD  clopidogrel (PLAVIX) 75 MG tablet Take 1 tablet (75 mg total) by mouth daily. 01/09/19   Roxan Hockey, MD  famotidine (PEPCID) 20 MG tablet Take 1 tablet (20 mg total) by mouth daily. 01/08/19 01/08/20  Roxan Hockey, MD  folic acid (FOLVITE) 1 MG tablet Take 1 tablet (1 mg total)  by mouth daily. 01/09/19   Roxan Hockey, MD  gabapentin (NEURONTIN) 300 MG capsule Take 300 mg by mouth daily after breakfast.    [provider]  hydrOXYzine (ATARAX/VISTARIL) 25 MG tablet Take 1 tablet (25 mg total) by mouth 3 (three) times daily as needed for itching, anxiety or nausea. 01/08/19   Roxan Hockey, MD  levETIRAcetam (KEPPRA) 500 MG tablet Take 1 tablet (500 mg total) by mouth 2 (two) times daily. Patient not taking: Reported on 01/05/2019 07/16/18 01/02/19  Damita Lack, MD  levETIRAcetam (KEPPRA) 750 MG tablet Take 2 tablets (1,500 mg total) by mouth 2 (two) times daily. 01/08/19   Roxan Hockey, MD  Multiple Vitamin (MULTIVITAMIN WITH  MINERALS) TABS tablet Take 1 tablet by mouth daily. Patient taking differently: Take 1 tablet by mouth daily. Centrum 01/07/18   Samuella Cota, MD  nicotine (NICODERM CQ - DOSED IN MG/24 HOURS) 14 mg/24hr patch Place 1 patch (14 mg total) onto the skin daily. 01/09/19   Roxan Hockey, MD  polyethylene glycol (MIRALAX / GLYCOLAX) 17 g packet Take 17 g by mouth daily as needed for moderate constipation or severe constipation. 01/08/19   Roxan Hockey, MD  Potassium Chloride ER 20 MEQ TBCR Take 20 mEq by mouth daily for 4 days. 1 tab daily by mouth 01/08/19 01/12/19  Roxan Hockey, MD  thiamine 100 MG tablet Take 1 tablet (100 mg total) by mouth daily. 01/09/19   Roxan Hockey, MD  venlafaxine XR (EFFEXOR-XR) 150 MG 24 hr capsule Take 1 capsule (150 mg total) by mouth daily with breakfast. 01/08/19 01/08/20  Roxan Hockey, MD  vitamin B-12 (CYANOCOBALAMIN) 1000 MCG tablet Take 1,000 mcg by mouth daily.    [provider]  Vitamin D, Ergocalciferol, (DRISDOL) 1.25 MG (50000 UT) CAPS capsule Take 1 capsule (50,000 Units total) by mouth every Saturday. 01/13/19   Roxan Hockey, MD    Family History Family History  Problem Relation Age of Onset  . Alcohol abuse Father   . Alcohol abuse Brother   . Breast cancer Maternal Aunt   . Colon cancer Neg Hx   . Esophageal cancer Neg Hx   . Stomach cancer Neg Hx   . Rectal cancer Neg Hx     Social History Social History   Tobacco Use  . Smoking status: Current Every Day Smoker    Packs/day: 0.50    Types: Cigarettes  . Smokeless tobacco: Never Used  . Tobacco comment: less  Substance Use Topics  . Alcohol use: Yes    Alcohol/week: 2.0 standard drinks    Types: 2 Cans of beer per week    Comment: occasionally   . Drug use: Yes    Types: Marijuana    Comment: quit 2 months ago 10-20-17     Allergies   Patient has no known allergies.   Review of Systems Review of Systems  All other systems reviewed and are negative.     Physical Exam Updated Vital Signs There were no vitals taken for this visit.  Physical Exam Vitals signs and nursing note reviewed.  Constitutional:      General: She is not in acute distress.    Appearance: Normal appearance. She is well-developed. She is not toxic-appearing.  HENT:     Head: Normocephalic and atraumatic.  Eyes:     General: Lids are normal.     Conjunctiva/sclera: Conjunctivae normal.     Pupils: Pupils are equal, round, and reactive to light.  Neck:  Musculoskeletal: Normal range of motion and neck supple.     Thyroid: No thyroid mass.     Trachea: No tracheal deviation.  Cardiovascular:     Rate and Rhythm: Regular rhythm. Tachycardia present.     Heart sounds: Normal heart sounds. No murmur. No gallop.   Pulmonary:     Effort: Pulmonary effort is normal. No respiratory distress.     Breath sounds: Normal breath sounds. No stridor. No decreased breath sounds, wheezing, rhonchi or rales.  Abdominal:     General: Bowel sounds are normal. There is no distension.     Palpations: Abdomen is soft.     Tenderness: There is no abdominal tenderness. There is no rebound.  Musculoskeletal: Normal range of motion.        General: No tenderness.       Back:  Skin:    General: Skin is warm and dry.     Findings: No abrasion or rash.  Neurological:     Mental Status: She is alert and oriented to person, place, and time.     GCS: GCS eye subscore is 4. GCS verbal subscore is 5. GCS motor subscore is 6.     Cranial Nerves: No cranial nerve deficit.     Sensory: No sensory deficit.     Motor: Weakness present.     Comments: Right extremity held in flexion.  Patient states this is her baseline.  Difficult exam due to effort.  She does withdraw to pain in her lower extremities.  Left upper extremity appears to be 4/ 5 strength.  Psychiatric:        Attention and Perception: Attention normal.        Mood and Affect: Affect is flat.        Speech: Speech is  delayed.        Behavior: Behavior is slowed.      ED Treatments / Results  Labs (all labs ordered are listed, but only abnormal results are displayed) Labs Reviewed  SARS CORONAVIRUS 2 (HOSPITAL ORDER, [facility])  CBC WITH DIFFERENTIAL/PLATELET  COMPREHENSIVE METABOLIC PANEL  CK  URINALYSIS, ROUTINE W REFLEX MICROSCOPIC  LACTIC ACID, PLASMA    EKG EKG Interpretation  Date/Time:  Friday June 22 2019 16:02:23 EDT Ventricular Rate:  119 PR Interval:    QRS Duration: 126 QT Interval:  349 QTC Calculation: 491 R Axis:   98 Text Interpretation:  Sinus tachycardia Biatrial enlargement RBBB and LPFB Baseline wander in lead(s) V3 Confirmed by Lacretia Leigh ([id number]) on 06/23/2019 4:07:55 PM   Radiology No results found.  Procedures Procedures (including critical care time)  Medications Ordered in ED Medications  0.9 %  sodium chloride infusion (has no administration in time range)  sodium chloride 0.9 % bolus 1,000 mL (has no administration in time range)     Initial Impression / Assessment and Plan / ED Course  I have reviewed the triage vital signs and the nursing notes.  Pertinent labs & imaging results that were available during my care of the patient were reviewed by me and considered in my medical decision making (see chart for details).      Patient evidence of dehydration here and started on IV fluids.  Has evidence of acute kidney injury likely from dehydration.  She was also has hypernatremia likely from same.  She is COVID negative.  Slight increase in her lactate.  She is afebrile.  Will admit to the medicine service    Final  Clinical Impressions(s) / ED Diagnoses   Final diagnoses:  None    ED Discharge Orders    None       Lacretia Leigh, MD 07/08/2019 1943

## 2019-06-22 NOTE — ED Notes (Addendum)
ED EMT notified this RN that pt was not responding. Upon assessment pt was right sided deviated, R sided facial droop, not responding to any stimuli. This RN asked Network engineer to page admitting doctor. RN also asked ED doctor Maryan Rued to come assess pt.

## 2019-06-22 NOTE — ED Provider Notes (Signed)
Patient moved into the yellow zone waiting admission and I was called in to evaluate the patient due to abrupt change in mental status.  Patient's eyes were fixed to the right with occasional right lip twitch.  She was not responsive to voice or pain.  She was tachycardic, tachypneic with a normal O2 sat.  Patient has a history of polysubstance abuse, press, seizure history and alcohol abuse.  Most likely she had not been on her medications and was supposed to be taking Keppra with a prior seizure history.  Low suspicion for acute stroke at this time and feel patient is most likely having seizures.  Patient was given a gram of Keppra and 4 mg of Ativan with improvement in eye deviation but patient is lethargic but now responsive to pain.  Spoke with neurology who evaluated the patient is getting a stat EEG.  Spoke with the hospitalist who is admitting the patient.  She currently still maintaining her airway.  Tachycardia has improved.  CRITICAL CARE Performed by: Terrance Lanahan Total critical care time: 30 minutes Critical care time was exclusive of separately billable procedures and treating other patients. Critical care was necessary to treat or prevent imminent or life-threatening deterioration. Critical care was time spent personally by me on the following activities: development of treatment plan with patient and/or surrogate as well as nursing, discussions with consultants, evaluation of patient's response to treatment, examination of patient, obtaining history from patient or surrogate, ordering and performing treatments and interventions, ordering and review of laboratory studies, ordering and review of radiographic studies, pulse oximetry and re-evaluation of patient's condition.    Blanchie Dessert, MD 06/28/2019 [phone]

## 2019-06-22 NOTE — ED Notes (Signed)
PAIN EVERYWHERE

## 2019-06-22 NOTE — ED Notes (Signed)
Pt with family at bedside. Pt states she wants to go home at this time. Dr. Roel Cluck informed of pt's wishes.   Pt given meal and drink at this time. Pt. Will no keep left arm straight to receive IV fluid bolus. Pt refusing in and out cath at this time.

## 2019-06-22 NOTE — H&P (Addendum)
Kelsey Wade [id number] DOB: Sep 15, 1961 DOA: 06/21/2019     PCP: Elenore Paddy, FNP   Outpatient Specialists:     NEurology  Dr.Willis   Patient arrived to ER on 07/03/2019 at 1545  Patient coming from: home Lives With family    Chief Complaint: found down Chief Complaint  Patient presents with  . Fall    HPI: Kelsey Wade is a 58 y.o. female with medical history significant of CVA, chronic back pain, depression, EtOH abuse, GERD, CVA, HTN, HLD, tobacco abuse     Presented with  A fall from home, rolled out of bed and fell to the floor, unable to gt up for 3 days Although husband was at home no food or drink administered to the patient. Husband said she was too heavy to pick up  Patient history significant that patient had an unresponsive due to PRESS syndrome episode in April Requiring intubation At the time of discharge patient refused placement she was discharged on Keppra 1500 twice a day and Depakote was discontinued.  When husband arrived to bedside patient stated that she would like to go home after further discussion with Dr. Zenia Resides and myself patient agreeable to stay overnight  Infectious risk factors:  Reports none  In  ER RAPID COVID TEST NEGATIVE   Lab Results  Component Value Date   [place] NEGATIVE 07/03/2019   [place] NEGATIVE 01/02/2019    Regarding pertinent Chronic problems:    Hyperlipidemia -   on statins Lipitor   HTN on Norvasc, coreg    Hx of CVA -  with  residual deficits and weakness on Aspirin 81 mg,   Plavix  History of seizures noted on EEG during last admission but improved with treatment   Possible drug abuse unknown negative UDS in the past   Lab Results  Component Value Date   CREATININE 1.80 (H) 06/29/2019   CREATININE 0.61 01/08/2019   CREATININE 0.56 01/07/2019    While in ER:  The following Work up has been ordered so far:  Orders Placed This Encounter  Procedures  . SARS Coronavirus 2  [facility] order, Performed in [facility] hospital lab) Nasopharyngeal Nasopharyngeal Swab  . CT Head Wo Contrast  . DG Chest [facility]  . DG Pelvis 1-2 Views  . CBC with Differential/Platelet  . Comprehensive metabolic panel  . CK  . Urinalysis, Routine w reflex microscopic  . Lactic acid, plasma  . In and Out Cath  . Consult for [place] Admission  ALL PATIENTS BEING ADMITTED/HAVING PROCEDURES NEED COVID-19 SCREENING  . CBG monitoring, ED  . EKG 12-Lead  . EKG 12-Lead    Following Medications were ordered in ER: Medications  0.9 %  sodium chloride infusion ( Intravenous Not Given 06/23/2019 1924)  0.9 %  sodium chloride infusion (has no administration in time range)  sodium chloride 0.9 % bolus 1,000 mL (1,000 mLs Intravenous New Bag/Given 07/20/2019 1710)      Significant initial  Findings: Abnormal Labs Reviewed  CBC WITH DIFFERENTIAL/PLATELET - Abnormal; Notable for the following components:      Result Value   WBC 18.2 (*)    RBC 6.93 (*)    Hemoglobin 19.9 (*)    HCT 61.4 (*)    RDW 17.2 (*)    Neutro Abs 14.6 (*)    Monocytes Absolute 1.4 (*)    Abs Immature Granulocytes 0.16 (*)    All other components within normal limits  COMPREHENSIVE METABOLIC PANEL - Abnormal; Notable  for the following components:   Sodium 157 (*)    Chloride 121 (*)    CO2 18 (*)    Glucose, Bld 160 (*)    BUN 112 (*)    Creatinine, Ser 1.80 (*)    GFR calc non Af Amer 31 (*)    GFR calc Af Amer 36 (*)    Anion gap 18 (*)    All other components within normal limits  LACTIC ACID, PLASMA - Abnormal; Notable for the following components:   Lactic Acid, Venous 2.6 (*)    All other components within normal limits  CBG MONITORING, ED - Abnormal; Notable for the following components:   Glucose-Capillary 162 (*)    All other components within normal limits    Otherwise labs showing:    Recent Labs  Lab 06/23/2019 1640  NA 157*  K 4.1  CO2 18*  GLUCOSE 160*  BUN 112*   CREATININE 1.80*  CALCIUM 9.7    Cr  Up from baseline see below Lab Results  Component Value Date   CREATININE 1.80 (H) 06/21/2019   CREATININE 0.61 01/08/2019   CREATININE 0.56 01/07/2019    Recent Labs  Lab 07/06/2019 1640  AST 22  ALT 27  ALKPHOS 81  BILITOT 1.1  PROT 7.7  ALBUMIN 3.7   Lab Results  Component Value Date   CALCIUM 9.7 07/16/2019   PHOS 2.7 01/05/2019      WBC      Component Value Date/Time   WBC 18.2 (H) 07/19/2019 1640   ANC    Component Value Date/Time   NEUTROABS 14.6 (H) 07/10/2019 1640   ALC No components found for: LYMPHAB    Plt: Lab Results  Component Value Date   PLT 294 07/20/2019     Lactic Acid, Venous    Component Value Date/Time   LATICACIDVEN 2.6 (HH) 07/09/2019 1610     COVID-19 Labs    Lab Results  Component Value Date   SARSCOV2NAA NEGATIVE 07/09/2019   [place] NEGATIVE 01/02/2019     HG/HCT  Up from baseline see below    Component Value Date/Time   HGB 19.9 (H) 07/10/2019 1640   HCT 61.4 (H) 07/18/2019 1640   HCT 36.8 07/14/2018 1026     Cardiac Panel (last 3 results) Recent Labs    07/13/2019 1640  CKTOTAL 192       ECG: Ordered Personally reviewed by me showing: HR : 119 Rhythm:  Sinus tachycardia    no evidence of ischemic changes QTC 491   DM  labs:  HbA1C: Recent Labs    07/14/18 0658  HGBA1C 5.9*       CBG (last 3)  Recent Labs    07/05/2019 1641  GLUCAP 162*       UA  ordered        Ordered  CT HEAD  NON acute improving diffuse edema  CXR - NON acute  Pelvis - negative  prior MRI brain  in April 2020 showed also  Extensive T2 signal abnormality throughout the right greater than left cerebral hemispheres and bilateral cerebellum affecting white matter greater than gray matter. Considerations include atypical posterior reversible encephalopathy syndrome, infectious encephalitis, and toxic leukoencephalopathy (such as from drug abuse).   ED Triage Vitals   Enc Vitals Group     BP 07/16/2019 1615 (!) 122/96     Pulse Rate 07/03/2019 1920 (!) 120     Resp 06/21/2019 1615 (!) 22     Temp 07/07/2019 1741  97.6 F (36.4 C)     Temp Source 07/11/2019 1741 Oral     SpO2 06/23/2019 1920 100 %     Weight 07/17/2019 1631 143 lb 8.3 oz (65.1 kg)     Height 07/16/2019 1631 5\' 4"  (1.626 m)     Head Circumference --      Peak Flow --      Pain Score 07/18/2019 1631 10     Pain Loc --      Pain Edu? --      Excl. in [place]? --   TMAX(24)@       Latest  Blood pressure (!) 139/123, pulse (!) 120, temperature 97.6 F (36.4 C), temperature source Oral, resp. rate (!) 23, height 5\' 4"  (1.626 m), weight 65.1 kg, SpO2 100 %.     Hospitalist was called for admission for hypernatremia   Review of Systems:    Pertinent positives include: Reports everything hurts but no localized symptoms  Constitutional:  No weight loss, night sweats, Fevers, chills, fatigue, weight loss  HEENT:  No headaches, Difficulty swallowing,Tooth/dental problems,Sore throat,  No sneezing, itching, ear ache, nasal congestion, post nasal drip,  Cardio-vascular:  No chest pain, Orthopnea, PND, anasarca, dizziness, palpitations.no Bilateral lower extremity swelling  GI:  No heartburn, indigestion, abdominal pain, nausea, vomiting, diarrhea, change in bowel habits, loss of appetite, melena, blood in stool, hematemesis Resp:  no shortness of breath at rest. No dyspnea on exertion, No excess mucus, no productive cough, No non-productive cough, No coughing up of blood.No change in color of mucus.No wheezing. Skin:  no rash or lesions. No jaundice GU:  no dysuria, change in color of urine, no urgency or frequency. No straining to urinate.  No flank pain.  Musculoskeletal:  No joint pain or no joint swelling. No decreased range of motion. No back pain.  Psych:  No change in mood or affect. No depression or anxiety. No memory loss.  Neuro: Chronic right-sided weakness   all systems reviewed and  apart from [place] all are negative  Past Medical History:   Past Medical History:  Diagnosis Date  . Anxiety   . Arthritis   . Chronic back pain   . Depression   . ETOH abuse   . GERD (gastroesophageal reflux disease)   . HTN (hypertension)   . Hyperlipidemia   . Stroke [facility])    June 2018  . Tobacco abuse       Past Surgical History:  Procedure Laterality Date  . APPENDECTOMY      Social History:  Ambulatory unclear    reports that she has been smoking cigarettes. She has been smoking about 0.50 packs per day. She has never used smokeless tobacco. She reports current alcohol use of about 2.0 standard drinks of alcohol per week. She reports current drug use. Drug: Marijuana.     Family History:   Family History  Problem Relation Age of Onset  . Alcohol abuse Father   . Alcohol abuse Brother   . Breast cancer Maternal Aunt   . Colon cancer Neg Hx   . Esophageal cancer Neg Hx   . Stomach cancer Neg Hx   . Rectal cancer Neg Hx     Allergies: No Known Allergies   Prior to Admission medications   Medication Sig Start Date End Date Taking? Authorizing Provider  albuterol (PROAIR HFA) 108 (90 Base) MCG/ACT inhaler Inhale 2 puffs into the lungs every 6 (six) hours as needed for wheezing or shortness of breath. Dx..J44.1  01/08/19   Roxan Hockey, MD  amLODipine (NORVASC) 10 MG tablet Take 1 tablet (10 mg total) by mouth daily. 01/08/19   Roxan Hockey, MD  aspirin EC 81 MG tablet Take 1 tablet (81 mg total) by mouth daily with breakfast. 01/08/19   Denton Brick, Courage, MD  atorvastatin (LIPITOR) 40 MG tablet Take 1 tablet (40 mg total) by mouth daily. 01/08/19 01/08/20  Roxan Hockey, MD  baclofen 5 MG TABS Take 5 mg by mouth 2 (two) times daily. 01/08/19   Roxan Hockey, MD  carvedilol (COREG) 3.125 MG tablet Take 1 tablet (3.125 mg total) by mouth 2 (two) times daily with a meal. 01/08/19   Emokpae, Courage, MD  clopidogrel (PLAVIX) 75 MG tablet Take 1 tablet (75 mg  total) by mouth daily. 01/09/19   Roxan Hockey, MD  famotidine (PEPCID) 20 MG tablet Take 1 tablet (20 mg total) by mouth daily. 01/08/19 01/08/20  Roxan Hockey, MD  folic acid (FOLVITE) 1 MG tablet Take 1 tablet (1 mg total) by mouth daily. 01/09/19   Roxan Hockey, MD  gabapentin (NEURONTIN) 300 MG capsule Take 300 mg by mouth daily after breakfast.    [provider]  hydrOXYzine (ATARAX/VISTARIL) 25 MG tablet Take 1 tablet (25 mg total) by mouth 3 (three) times daily as needed for itching, anxiety or nausea. 01/08/19   Roxan Hockey, MD  levETIRAcetam (KEPPRA) 500 MG tablet Take 1 tablet (500 mg total) by mouth 2 (two) times daily. Patient not taking: Reported on 01/05/2019 07/16/18 01/02/19  Damita Lack, MD  levETIRAcetam (KEPPRA) 750 MG tablet Take 2 tablets (1,500 mg total) by mouth 2 (two) times daily. 01/08/19   Roxan Hockey, MD  Multiple Vitamin (MULTIVITAMIN WITH MINERALS) TABS tablet Take 1 tablet by mouth daily. Patient taking differently: Take 1 tablet by mouth daily. Centrum 01/07/18   Samuella Cota, MD  nicotine (NICODERM CQ - DOSED IN MG/24 HOURS) 14 mg/24hr patch Place 1 patch (14 mg total) onto the skin daily. 01/09/19   Roxan Hockey, MD  polyethylene glycol (MIRALAX / GLYCOLAX) 17 g packet Take 17 g by mouth daily as needed for moderate constipation or severe constipation. 01/08/19   Roxan Hockey, MD  Potassium Chloride ER 20 MEQ TBCR Take 20 mEq by mouth daily for 4 days. 1 tab daily by mouth 01/08/19 01/12/19  Roxan Hockey, MD  thiamine 100 MG tablet Take 1 tablet (100 mg total) by mouth daily. 01/09/19   Roxan Hockey, MD  venlafaxine XR (EFFEXOR-XR) 150 MG 24 hr capsule Take 1 capsule (150 mg total) by mouth daily with breakfast. 01/08/19 01/08/20  Roxan Hockey, MD  vitamin B-12 (CYANOCOBALAMIN) 1000 MCG tablet Take 1,000 mcg by mouth daily.    [provider]  Vitamin D, Ergocalciferol, (DRISDOL) 1.25 MG (50000 UT) CAPS  capsule Take 1 capsule (50,000 Units total) by mouth every Saturday. 01/13/19   Roxan Hockey, MD   Physical Exam: Blood pressure (!) 139/123, pulse (!) 120, temperature 97.6 F (36.4 C), temperature source Oral, resp. rate (!) 23, height 5\' 4"  (1.626 m), weight 65.1 kg, SpO2 100 %. 1. General:  in No  Acute distress    Chronically ill  -appearing 2. Psychological: Alert and  Oriented to self, not time 3. Head/ENT:     Dry Mucous Membranes                          Head Non traumatic, neck supple  Poor Dentition 4. SKIN:   decreased Skin turgor,  Skin clean Dry and intact no rash 5. Heart: Regular rate and rhythm no Murmur, no Rub or gallop 6. Lungs:  Clear to auscultation bilaterally, no wheezes or crackles   7. Abdomen: Soft, generalized mild tender, Non distended  bowel sounds present 8. Lower extremities: no clubbing, cyanosis, no  Edema, cool lower extremity bilaterally but palpable pulses and normal refill 9. Neurologically diminished strength on the right with right hand spasticity.  Left hand patient refused to make a fist on exam but was seen using it normally otherwise grabbing onto the rail 10. MSK: Normal range of motion   All other LABS:     Recent Labs  Lab 07/01/2019 1640  WBC 18.2*  NEUTROABS 14.6*  HGB 19.9*  HCT 61.4*  MCV 88.6  PLT 294     Recent Labs  Lab 06/26/2019 1640  NA 157*  K 4.1  CL 121*  CO2 18*  GLUCOSE 160*  BUN 112*  CREATININE 1.80*  CALCIUM 9.7     Recent Labs  Lab 06/24/2019 1640  AST 22  ALT 27  ALKPHOS 81  BILITOT 1.1  PROT 7.7  ALBUMIN 3.7       Cultures:    Component Value Date/Time   SDES URINE, CATHETERIZED 01/03/2019 0239   SPECREQUEST NONE 01/03/2019 0239   CULT  01/03/2019 0239    NO GROWTH Performed at [facility], [place] [address] [place], [place] [address]    REPTSTATUS 01/04/2019 FINAL 01/03/2019 0239     Radiological Exams on Admission: Dg Pelvis 1-2 Views  Result  Date: 07/06/2019 CLINICAL DATA:  Pain after multiple falls. EXAM: PELVIS - 1-2 VIEW COMPARISON:  None. FINDINGS: There is no evidence of pelvic fracture or diastasis. No pelvic bone lesions are seen. IMPRESSION: Negative. Electronically Signed   By: Marijo Conception M.D.   On: 06/26/2019 16:43   Ct Head Wo Contrast  Result Date: 06/30/2019 CLINICAL DATA:  Altered level of consciousness EXAM: CT HEAD WITHOUT CONTRAST TECHNIQUE: Contiguous axial images were obtained from the base of the skull through the vertex without intravenous contrast. COMPARISON:  January 02, 2019 CT, MRI January 03, 2019 FINDINGS: Brain: Again noted is diffuse bilateral white matter hypodensity, right greater than left, which appears slightly improved since the prior exam. No new areas of infarct or hypodensity are seen. There is scattered areas of encephalomalacia involving the left frontoparietal lobe. No new extra-axial collection are seen. The ventricles are unchanged in size and contour. Vascular: Unchanged hyperdense right ICA at the cavernous sinus, consistent with chronic thrombosis. Skull: The skull is intact. No fracture or focal lesion identified. Sinuses/Orbits: The visualized paranasal sinuses and mastoid air cells are clear. The orbits and globes intact. Other: None IMPRESSION: 1. bilateral diffuse areas of edema , right greater than left slightly improved since the prior exam. 2. Chronic areas of encephalomalacia in the left frontoparietal lobe. 3. Stable hyperdense right ICA, consistent with chronic thrombosis. Electronically Signed   By: Prudencio Pair M.D.   On: 07/05/2019 19:33   Dg Chest Port 1 View  Result Date: 06/23/2019 CLINICAL DATA:  Shortness of breath. EXAM: PORTABLE CHEST 1 VIEW COMPARISON:  Radiographs of January 03, 2019. FINDINGS: The heart size and mediastinal contours are within normal limits. Both lungs are clear. No pneumothorax or pleural effusion is noted. The visualized skeletal structures are unremarkable.  IMPRESSION: No active disease. Electronically Signed   By: Marijo Conception  M.D.   On: 07/11/2019 16:42    Chart has been reviewed    Assessment/Plan   58 y.o. female with medical history significant of CVA, chronic back pain, depression, EtOH abuse, GERD, CVA, HTN, HLD, tobacco abuse Admitted for dehydration hypernatremia possibly status epilepticus  Present on Admission: Pt developed a seizure like activity - became unresponsive, ER MD assessed at bedside administer Ativan 4 mg IV and Keppra loading dose, seizure activity which was eyes deviated to the right and twitching of the right face improved somewhat but patient continued to be unresponsive even with significant painful stimuli occasionally only grimacing with only right side of her face neurology consult has been obtained neurology has seen patient at bedside Plan EEG. Patient has history of the same in the past required intubation Discussed with PCCM who will see patient in consult and assess  . Hypernatremia in the setting of p.o. intake for the past 3 days will rehydrate administer iv fluids, monitor sodium frequently  . AKI (acute kidney injury) ([place]) in the setting of dehydration will rehydrate, obtain urine electrolytes  . Cerebral edema (HCC) chronic appears to be improving, questionable left-sided weakness appears to be transient and effort dependent.  If persists despite electrolyte stabilization would consider further imaging and seizure repeat MRI brain  . Chronic pain monitor avoid sedating medications  . ETOH abuse CIWA protocol ordered  . HTN (hypertension) continue home medications  . Hyperlipidemia LDL goal <100 chronic stable continue home medications  . Toxic metabolic encephalopathy in the setting of hypernatremia, admit to stepdown monitor while administer IV fluids repeat serial sodium hold sedating medications  Possible neglect patient has been down for 3 days and so family been offering her food or  water recommend social work consult Abdominal tenderness - mild will eval for stool burden to start with.  Dusky right foot -but good refill.  Possibly chronic obtain Dopplers to further evaluate and check ABI   Other plan as per orders.  DVT prophylaxis:  SCD     Code Status:  FULL CODE as per patient  I had personally discussed CODE STATUS with patient   Family Communication:   Family  at  Bedside    Disposition Plan:  likely will need placement for rehabilitation                                              Would benefit from PT/OT eval prior to DC  Ordered                   Swallow eval - SLP ordered                   Social Work  consulted                   Nutrition    consulted                                      Consults called: none  Admission status:  ED Disposition    None        inpatient     Expect 2 midnight stay secondary to severity of patient's current illness including   hemodynamic instability despite optimal treatment (tachycardia  )   Severe lab/radiological/exam abnormalities  including:    hypernatremia  and extensive comorbidities including:  substance abuse   Chronic pain  history of stroke with residual deficits    That are currently affecting medical management.   I expect  patient to be hospitalized for 2 midnights requiring inpatient medical care.  Patient is at high risk for adverse outcome (such as loss of life or disability) if not treated.  Indication for inpatient stay as follows:  Severe change from baseline regarding mental status Hemodynamic instability despite maximal medical therapy,    Need for   IV fluids,       Level of care      SDU tele indefinitely please discontinue once patient no longer qualifies  Precautions:    No active isolations  PPE: Used by the provider:   P100  eye Goggles,  Gloves     Nissi Doffing 07/19/2019, 9:30 PM    [facility]     after 2 AM please page floor coverage PA  If 7AM-7PM, please contact the day team taking care of the patient using [email or web address]

## 2019-06-22 NOTE — ED Notes (Signed)
Pt called out stating that she needed to use the bathroom. Pt is on [place] and was reminded that she is unable to get up and go to the bathroom due to her fall. Pt told she can go ahead and pee. Pt voiced understanding.

## 2019-06-22 NOTE — ED Notes (Signed)
To ct

## 2019-06-22 NOTE — ED Notes (Signed)
IV TEAM AT BEDSIDE 

## 2019-06-22 NOTE — ED Notes (Signed)
Dr. Roel Cluck, Dr. Maryan Rued, and Neuro at bedside

## 2019-06-22 NOTE — ED Notes (Signed)
PUREWICK PLACED 

## 2019-06-22 NOTE — ED Triage Notes (Signed)
The pt arrived by gems from home where she has been lying in the floor for 3 days.  The husband decided to call ems today  She is alert  She has been cleaned of poop  She has a necrotic wound rt upper buttocks approx size of a golf ball  Pressure pad placed  Area of blister on her rt scapula also  She has an old stroke on the rt side   Her rt arm has been bent backward under her body for 3 days.  Good pulse in the rt radius  Rt hand is bent downward //NEW OR OLD

## 2019-06-23 ENCOUNTER — Inpatient Hospital Stay (HOSPITAL_COMMUNITY): Payer: Medicaid Other

## 2019-06-23 DIAGNOSIS — N179 Acute kidney failure, unspecified: Secondary | ICD-10-CM

## 2019-06-23 DIAGNOSIS — R569 Unspecified convulsions: Secondary | ICD-10-CM

## 2019-06-23 DIAGNOSIS — J9601 Acute respiratory failure with hypoxia: Secondary | ICD-10-CM | POA: Diagnosis present

## 2019-06-23 DIAGNOSIS — R579 Shock, unspecified: Secondary | ICD-10-CM

## 2019-06-23 DIAGNOSIS — G40901 Epilepsy, unspecified, not intractable, with status epilepticus: Secondary | ICD-10-CM

## 2019-06-23 DIAGNOSIS — G9340 Encephalopathy, unspecified: Secondary | ICD-10-CM

## 2019-06-23 LAB — CBC
HCT: 60.4 % — ABNORMAL HIGH (ref 36.0–46.0)
HCT: 65.8 % — ABNORMAL HIGH (ref 36.0–46.0)
Hemoglobin: 19.4 g/dL — ABNORMAL HIGH (ref 12.0–15.0)
Hemoglobin: 20.1 g/dL — ABNORMAL HIGH (ref 12.0–15.0)
MCH: 28.5 pg (ref 26.0–34.0)
MCH: 27.9 pg (ref 26.0–34.0)
MCHC: 32.1 g/dL (ref 30.0–36.0)
MCHC: 30.5 g/dL (ref 30.0–36.0)
MCV: 88.8 fL (ref 80.0–100.0)
MCV: 91.3 fL (ref 80.0–100.0)
Platelets: 327 10*3/uL (ref 150–400)
Platelets: 294 10*3/uL (ref 150–400)
RBC: 6.8 MIL/uL — ABNORMAL HIGH (ref 3.87–5.11)
RBC: 7.21 MIL/uL — ABNORMAL HIGH (ref 3.87–5.11)
RDW: 17.2 % — ABNORMAL HIGH (ref 11.5–15.5)
RDW: 17.2 % — ABNORMAL HIGH (ref 11.5–15.5)
WBC: 17.3 10*3/uL — ABNORMAL HIGH (ref 4.0–10.5)
WBC: 17.6 10*3/uL — ABNORMAL HIGH (ref 4.0–10.5)
nRBC: 0 % (ref 0.0–0.2)
nRBC: 0 % (ref 0.0–0.2)

## 2019-06-23 LAB — T4, FREE: Free T4: 1.04 ng/dL (ref 0.61–1.12)

## 2019-06-23 LAB — URINALYSIS, ROUTINE W REFLEX MICROSCOPIC
Bilirubin Urine: NEGATIVE
Bilirubin Urine: NEGATIVE
Glucose, UA: NEGATIVE mg/dL
Glucose, UA: 50 mg/dL — AB
Hgb urine dipstick: NEGATIVE
Ketones, ur: NEGATIVE mg/dL
Ketones, ur: NEGATIVE mg/dL
Leukocytes,Ua: NEGATIVE
Nitrite: NEGATIVE
Nitrite: NEGATIVE
Protein, ur: NEGATIVE mg/dL
Protein, ur: NEGATIVE mg/dL
Specific Gravity, Urine: 1.023 (ref 1.005–1.030)
Specific Gravity, Urine: 1.02 (ref 1.005–1.030)
pH: 5 (ref 5.0–8.0)
pH: 5 (ref 5.0–8.0)

## 2019-06-23 LAB — LACTIC ACID, PLASMA: Lactic Acid, Venous: 1.9 mmol/L (ref 0.5–1.9)

## 2019-06-23 LAB — COMPREHENSIVE METABOLIC PANEL
ALT: 25 U/L (ref 0–44)
ALT: 25 U/L (ref 0–44)
AST: 20 U/L (ref 15–41)
AST: 23 U/L (ref 15–41)
Albumin: 3.7 g/dL (ref 3.5–5.0)
Albumin: 3.4 g/dL — ABNORMAL LOW (ref 3.5–5.0)
Alkaline Phosphatase: 82 U/L (ref 38–126)
Alkaline Phosphatase: 94 U/L (ref 38–126)
Anion gap: 16 — ABNORMAL HIGH (ref 5–15)
Anion gap: 21 — ABNORMAL HIGH (ref 5–15)
BUN: 125 mg/dL — ABNORMAL HIGH (ref 6–20)
BUN: 137 mg/dL — ABNORMAL HIGH (ref 6–20)
CO2: 20 mmol/L — ABNORMAL LOW (ref 22–32)
CO2: 17 mmol/L — ABNORMAL LOW (ref 22–32)
Calcium: 9.6 mg/dL (ref 8.9–10.3)
Calcium: 9.5 mg/dL (ref 8.9–10.3)
Chloride: 120 mmol/L — ABNORMAL HIGH (ref 98–111)
Chloride: 120 mmol/L — ABNORMAL HIGH (ref 98–111)
Creatinine, Ser: 2.55 mg/dL — ABNORMAL HIGH (ref 0.44–1.00)
Creatinine, Ser: 2.6 mg/dL — ABNORMAL HIGH (ref 0.44–1.00)
GFR calc Af Amer: 24 mL/min — ABNORMAL LOW (ref >60)
GFR calc Af Amer: 23 mL/min — ABNORMAL LOW (ref >60)
GFR calc non Af Amer: 20 mL/min — ABNORMAL LOW (ref >60)
GFR calc non Af Amer: 20 mL/min — ABNORMAL LOW (ref >60)
Glucose, Bld: 220 mg/dL — ABNORMAL HIGH (ref 70–99)
Glucose, Bld: 179 mg/dL — ABNORMAL HIGH (ref 70–99)
Potassium: 4.3 mmol/L (ref 3.5–5.1)
Potassium: 5.1 mmol/L (ref 3.5–5.1)
Sodium: 156 mmol/L — ABNORMAL HIGH (ref 135–145)
Sodium: 158 mmol/L — ABNORMAL HIGH (ref 135–145)
Total Bilirubin: 1 mg/dL (ref 0.3–1.2)
Total Bilirubin: 0.7 mg/dL (ref 0.3–1.2)
Total Protein: 7.8 g/dL (ref 6.5–8.1)
Total Protein: 7.3 g/dL (ref 6.5–8.1)

## 2019-06-23 LAB — BASIC METABOLIC PANEL
Anion gap: 15 (ref 5–15)
Anion gap: 10 (ref 5–15)
BUN: 134 mg/dL — ABNORMAL HIGH (ref 6–20)
BUN: 120 mg/dL — ABNORMAL HIGH (ref 6–20)
BUN: 118 mg/dL — ABNORMAL HIGH (ref 6–20)
CO2: 19 mmol/L — ABNORMAL LOW (ref 22–32)
CO2: 19 mmol/L — ABNORMAL LOW (ref 22–32)
CO2: 18 mmol/L — ABNORMAL LOW (ref 22–32)
Calcium: 8.4 mg/dL — ABNORMAL LOW (ref 8.9–10.3)
Calcium: 7.4 mg/dL — ABNORMAL LOW (ref 8.9–10.3)
Calcium: 7.3 mg/dL — ABNORMAL LOW (ref 8.9–10.3)
Chloride: 125 mmol/L — ABNORMAL HIGH (ref 98–111)
Chloride: 127 mmol/L — ABNORMAL HIGH (ref 98–111)
Chloride: >130 mmol/L (ref 98–111)
Creatinine, Ser: 2.63 mg/dL — ABNORMAL HIGH (ref 0.44–1.00)
Creatinine, Ser: 2.24 mg/dL — ABNORMAL HIGH (ref 0.44–1.00)
Creatinine, Ser: 2.11 mg/dL — ABNORMAL HIGH (ref 0.44–1.00)
GFR calc Af Amer: 23 mL/min — ABNORMAL LOW (ref >60)
GFR calc Af Amer: 28 mL/min — ABNORMAL LOW (ref >60)
GFR calc Af Amer: 30 mL/min — ABNORMAL LOW (ref >60)
GFR calc non Af Amer: 20 mL/min — ABNORMAL LOW (ref >60)
GFR calc non Af Amer: 24 mL/min — ABNORMAL LOW (ref >60)
GFR calc non Af Amer: 26 mL/min — ABNORMAL LOW (ref >60)
Glucose, Bld: 267 mg/dL — ABNORMAL HIGH (ref 70–99)
Glucose, Bld: 320 mg/dL — ABNORMAL HIGH (ref 70–99)
Glucose, Bld: 250 mg/dL — ABNORMAL HIGH (ref 70–99)
Potassium: 3.8 mmol/L (ref 3.5–5.1)
Potassium: 3.7 mmol/L (ref 3.5–5.1)
Potassium: 3.4 mmol/L — ABNORMAL LOW (ref 3.5–5.1)
Sodium: 159 mmol/L — ABNORMAL HIGH (ref 135–145)
Sodium: 156 mmol/L — ABNORMAL HIGH (ref 135–145)
Sodium: 158 mmol/L — ABNORMAL HIGH (ref 135–145)

## 2019-06-23 LAB — POCT I-STAT 7, (LYTES, BLD GAS, ICA,H+H)
Acid-base deficit: 4 mmol/L — ABNORMAL HIGH (ref 0.0–2.0)
Bicarbonate: 20.7 mmol/L (ref 20.0–28.0)
Calcium, Ion: 1.17 mmol/L (ref 1.15–1.40)
HCT: 48 % — ABNORMAL HIGH (ref 36.0–46.0)
Hemoglobin: 16.3 g/dL — ABNORMAL HIGH (ref 12.0–15.0)
O2 Saturation: 100 %
Patient temperature: 98.6
Potassium: 4.1 mmol/L (ref 3.5–5.1)
Sodium: 159 mmol/L — ABNORMAL HIGH (ref 135–145)
TCO2: 22 mmol/L (ref 22–32)
pCO2 arterial: 37 mmHg (ref 32.0–48.0)
pH, Arterial: 7.355 (ref 7.350–7.450)
pO2, Arterial: 542 mmHg — ABNORMAL HIGH (ref 83.0–108.0)

## 2019-06-23 LAB — PROCALCITONIN: Procalcitonin: 0.16 ng/mL

## 2019-06-23 LAB — RAPID URINE DRUG SCREEN, HOSP PERFORMED
Amphetamines: NOT DETECTED
Barbiturates: NOT DETECTED
Benzodiazepines: POSITIVE — AB
Cocaine: NOT DETECTED
Opiates: NOT DETECTED
Tetrahydrocannabinol: POSITIVE — AB

## 2019-06-23 LAB — HEMOGLOBIN A1C
Hgb A1c MFr Bld: 5.7 % — ABNORMAL HIGH (ref 4.8–5.6)
Mean Plasma Glucose: 116.89 mg/dL

## 2019-06-23 LAB — PHOSPHORUS
Phosphorus: 5.3 mg/dL — ABNORMAL HIGH (ref 2.5–4.6)
Phosphorus: 5.1 mg/dL — ABNORMAL HIGH (ref 2.5–4.6)

## 2019-06-23 LAB — CBG MONITORING, ED: Glucose-Capillary: 175 mg/dL — ABNORMAL HIGH (ref 70–99)

## 2019-06-23 LAB — GLUCOSE, CAPILLARY
Glucose-Capillary: 231 mg/dL — ABNORMAL HIGH (ref 70–99)
Glucose-Capillary: 293 mg/dL — ABNORMAL HIGH (ref 70–99)
Glucose-Capillary: 296 mg/dL — ABNORMAL HIGH (ref 70–99)
Glucose-Capillary: 96 mg/dL (ref 70–99)

## 2019-06-23 LAB — CORTISOL: Cortisol, Plasma: 28.1 ug/dL

## 2019-06-23 LAB — MAGNESIUM
Magnesium: 3 mg/dL — ABNORMAL HIGH (ref 1.7–2.4)
Magnesium: 3 mg/dL — ABNORMAL HIGH (ref 1.7–2.4)

## 2019-06-23 LAB — CK: Total CK: 131 U/L (ref 38–234)

## 2019-06-23 LAB — PREALBUMIN: Prealbumin: 16.4 mg/dL — ABNORMAL LOW (ref 18–38)

## 2019-06-23 LAB — CREATININE, URINE, RANDOM: Creatinine, Urine: 142.11 mg/dL

## 2019-06-23 LAB — TSH: TSH: 6.012 u[IU]/mL — ABNORMAL HIGH (ref 0.350–4.500)

## 2019-06-23 LAB — SODIUM, URINE, RANDOM: Sodium, Ur: <10 mmol/L

## 2019-06-23 MED ORDER — LORAZEPAM 2 MG/ML IJ SOLN
INTRAMUSCULAR | Status: AC
  Administered 2019-06-23: 11:00:00
  Filled 2019-06-23: qty 2

## 2019-06-23 MED ORDER — ATORVASTATIN CALCIUM 40 MG PO TABS
40.0000 mg | ORAL_TABLET | Freq: Every day | ORAL | Status: DC
  Administered 2019-06-24: 40 mg
  Filled 2019-06-23: qty 1

## 2019-06-23 MED ORDER — MIDAZOLAM BOLUS VIA INFUSION
10.0000 mg | Freq: Once | INTRAVENOUS | Status: DC
  Filled 2019-06-23: qty 10

## 2019-06-23 MED ORDER — PIPERACILLIN-TAZOBACTAM 3.375 G IVPB
3.3750 g | Freq: Three times a day (TID) | INTRAVENOUS | Status: DC
  Administered 2019-06-23 – 2019-06-24 (×3): 3.375 g via INTRAVENOUS
  Filled 2019-06-23 (×4): qty 50

## 2019-06-23 MED ORDER — VANCOMYCIN HCL IN DEXTROSE 1-5 GM/200ML-% IV SOLN
1000.0000 mg | INTRAVENOUS | Status: DC
  Administered 2019-06-23: 15:00:00 1000 mg via INTRAVENOUS
  Filled 2019-06-23: qty 200

## 2019-06-23 MED ORDER — VITAMIN B-1 100 MG PO TABS
100.0000 mg | ORAL_TABLET | Freq: Every day | ORAL | Status: DC
  Administered 2019-06-24: 100 mg
  Filled 2019-06-23: qty 1

## 2019-06-23 MED ORDER — ONDANSETRON HCL 4 MG PO TABS: 4.0000 mg | ORAL_TABLET | Freq: Four times a day (QID) | ORAL | Status: DC | PRN

## 2019-06-23 MED ORDER — CHLORHEXIDINE GLUCONATE 0.12% ORAL RINSE (MEDLINE KIT)
15.0000 mL | Freq: Two times a day (BID) | OROMUCOSAL | Status: DC
  Administered 2019-06-23 – 2019-06-24 (×2): 15 mL via OROMUCOSAL

## 2019-06-23 MED ORDER — AMLODIPINE BESYLATE 10 MG PO TABS
10.0000 mg | ORAL_TABLET | Freq: Every day | ORAL | Status: DC
  Administered 2019-06-24: 10:00:00 10 mg
  Filled 2019-06-23: qty 1

## 2019-06-23 MED ORDER — LORAZEPAM 2 MG/ML IJ SOLN: 2.0000 mg | Freq: Two times a day (BID) | INTRAMUSCULAR | Status: DC | PRN

## 2019-06-23 MED ORDER — ORAL CARE MOUTH RINSE
15.0000 mL | OROMUCOSAL | Status: DC
  Administered 2019-06-23 – 2019-06-24 (×11): 15 mL via OROMUCOSAL

## 2019-06-23 MED ORDER — CARVEDILOL 3.125 MG PO TABS
3.1250 mg | ORAL_TABLET | Freq: Two times a day (BID) | ORAL | Status: DC
  Administered 2019-06-24: 08:00:00 3.125 mg
  Filled 2019-06-23 (×2): qty 1

## 2019-06-23 MED ORDER — MIDAZOLAM 50MG/50ML (1MG/ML) PREMIX INFUSION: 10.0000 mg/h | INTRAVENOUS | Status: DC

## 2019-06-23 MED ORDER — HYDROCODONE-ACETAMINOPHEN 5-325 MG PO TABS: 1.0000 | ORAL_TABLET | ORAL | Status: DC | PRN

## 2019-06-23 MED ORDER — LEVETIRACETAM IN NACL 500 MG/100ML IV SOLN
500.0000 mg | Freq: Two times a day (BID) | INTRAVENOUS | Status: DC
  Administered 2019-06-23 – 2019-06-27 (×9): 500 mg via INTRAVENOUS
  Filled 2019-06-23 (×11): qty 100

## 2019-06-23 MED ORDER — FENTANYL 2500MCG IN NS 250ML (10MCG/ML) PREMIX INFUSION: 50.0000 ug/h | INTRAVENOUS | Status: DC

## 2019-06-23 MED ORDER — FENTANYL CITRATE (PF) 100 MCG/2ML IJ SOLN
50.0000 ug | Freq: Once | INTRAMUSCULAR | Status: AC
  Administered 2019-06-23: 11:00:00 50 ug via INTRAVENOUS

## 2019-06-23 MED ORDER — PHENYTOIN SODIUM 50 MG/ML IJ SOLN
100.0000 mg | Freq: Three times a day (TID) | INTRAMUSCULAR | Status: DC
  Administered 2019-06-23 – 2019-06-27 (×13): 100 mg via INTRAVENOUS
  Filled 2019-06-23 (×15): qty 2

## 2019-06-23 MED ORDER — THIAMINE HCL 100 MG/ML IJ SOLN
Freq: Once | INTRAVENOUS | Status: AC
  Administered 2019-06-23: 12:00:00 via INTRAVENOUS
  Filled 2019-06-23 (×3): qty 1000

## 2019-06-23 MED ORDER — HYDROCORTISONE NA SUCCINATE PF 100 MG IJ SOLR
50.0000 mg | Freq: Four times a day (QID) | INTRAMUSCULAR | Status: DC
  Administered 2019-06-23 – 2019-06-24 (×4): 50 mg via INTRAVENOUS
  Filled 2019-06-23 (×4): qty 2

## 2019-06-23 MED ORDER — SODIUM CHLORIDE 0.9 % IV SOLN
200.0000 mg | INTRAVENOUS | Status: AC
  Administered 2019-06-23: 20:00:00 200 mg via INTRAVENOUS
  Filled 2019-06-23: qty 20

## 2019-06-23 MED ORDER — SODIUM CHLORIDE 0.9 % IV SOLN
20.0000 mg/kg | INTRAVENOUS | Status: AC
  Administered 2019-06-23: 1302 mg via INTRAVENOUS
  Filled 2019-06-23: qty 26.04

## 2019-06-23 MED ORDER — CHLORHEXIDINE GLUCONATE CLOTH 2 % EX PADS
6.0000 | MEDICATED_PAD | Freq: Every day | CUTANEOUS | Status: DC
  Administered 2019-06-23 (×2): 6 via TOPICAL

## 2019-06-23 MED ORDER — THIAMINE HCL 100 MG/ML IJ SOLN: 100.0000 mg | Freq: Every day | INTRAMUSCULAR | Status: DC

## 2019-06-23 MED ORDER — ADULT MULTIVITAMIN LIQUID CH
15.0000 mL | Freq: Every day | ORAL | Status: DC
  Administered 2019-06-24: 15 mL
  Filled 2019-06-23: qty 15

## 2019-06-23 MED ORDER — FENTANYL BOLUS VIA INFUSION
50.0000 ug | INTRAVENOUS | Status: DC | PRN
  Administered 2019-06-24: 50 ug via INTRAVENOUS
  Filled 2019-06-23: qty 50

## 2019-06-23 MED ORDER — LEVETIRACETAM 500 MG PO TABS: 500.0000 mg | ORAL_TABLET | Freq: Two times a day (BID) | ORAL | Status: DC

## 2019-06-23 MED ORDER — SODIUM CHLORIDE 0.9 % IV SOLN
INTRAVENOUS | Status: DC
  Administered 2019-06-23 – 2019-06-24 (×2): via INTRAVENOUS

## 2019-06-23 MED ORDER — EPINEPHRINE PF 1 MG/10ML IJ SOSY
1.0000 mg | PREFILLED_SYRINGE | Freq: Once | INTRAMUSCULAR | Status: AC
  Administered 2019-06-23: 1 mg via INTRAVENOUS
  Filled 2019-06-23: qty 10

## 2019-06-23 MED ORDER — MIDAZOLAM HCL 2 MG/2ML IJ SOLN
INTRAMUSCULAR | Status: AC
  Administered 2019-06-23: 2 mg
  Filled 2019-06-23: qty 2

## 2019-06-23 MED ORDER — ONDANSETRON HCL 4 MG/2ML IJ SOLN: 4.0000 mg | Freq: Four times a day (QID) | INTRAMUSCULAR | Status: DC | PRN

## 2019-06-23 MED ORDER — ALBUTEROL SULFATE (2.5 MG/3ML) 0.083% IN NEBU: 2.5000 mg | INHALATION_SOLUTION | Freq: Four times a day (QID) | RESPIRATORY_TRACT | Status: DC | PRN

## 2019-06-23 MED ORDER — FENTANYL CITRATE (PF) 100 MCG/2ML IJ SOLN
INTRAMUSCULAR | Status: AC
  Administered 2019-06-23: 10:00:00
  Filled 2019-06-23: qty 4

## 2019-06-23 MED ORDER — FOLIC ACID 1 MG PO TABS
1.0000 mg | ORAL_TABLET | Freq: Every day | ORAL | Status: DC
  Administered 2019-06-24: 1 mg
  Filled 2019-06-23: qty 1

## 2019-06-23 MED ORDER — NOREPINEPHRINE 4 MG/250ML-% IV SOLN
0.0000 ug/min | INTRAVENOUS | Status: DC
  Administered 2019-06-23: 4 ug/min via INTRAVENOUS
  Administered 2019-06-23: 5 ug/min via INTRAVENOUS
  Filled 2019-06-23: qty 250

## 2019-06-23 MED ORDER — CLOPIDOGREL BISULFATE 75 MG PO TABS
75.0000 mg | ORAL_TABLET | Freq: Every day | ORAL | Status: DC
  Administered 2019-06-24: 10:00:00 75 mg
  Filled 2019-06-23: qty 1

## 2019-06-23 MED ORDER — INSULIN ASPART 100 UNIT/ML ~~LOC~~ SOLN
0.0000 [IU] | SUBCUTANEOUS | Status: DC
  Administered 2019-06-23: 5 [IU] via SUBCUTANEOUS
  Administered 2019-06-24 (×2): 3 [IU] via SUBCUTANEOUS

## 2019-06-23 MED ORDER — MIDAZOLAM BOLUS VIA INFUSION
1.0000 mg | INTRAVENOUS | Status: DC | PRN
  Filled 2019-06-23: qty 2

## 2019-06-23 NOTE — Progress Notes (Signed)
High BP and labs out of range for PT and will reattempt at another time.   06/23/19 0800  PT Visit Information  Last PT Received On 06/23/19  Reason Eval/Treat Not Completed Other (comment)    Mee Hives, PT MS Acute Rehab Dept. Number: [place] and [place]

## 2019-06-23 NOTE — ED Notes (Signed)
Pt stopped breathing and a pulse could not be felt, though monitor showed rhythm.  Approx 10 compressions given.  Pt began breathing again with weak pulse noted - ST.  Dr Leonel Ramsay at bedside stated pt was not seizing.  ED doc present, but CCM quickly arrived after paged by admitting MD.  Pressures began dropping.  Phenatoin paused when pressures dropped to 90 systolic and [id number] NS bolus started.  Pressures continued to drop into the 50's but increased to [id number] systolic with bolus.  PT transported to ICU and intubated immediately upon arrival.

## 2019-06-23 NOTE — Procedures (Signed)
Central Venous Catheter Insertion Procedure Note Kelsey Wade [id number] 06/14/1961  Procedure: Insertion of Central Venous Catheter Indications: Drug and/or fluid administration  Procedure Details Consent: Unable to obtain consent because of emergent medical necessity. Time Out: Verified patient identification, verified procedure, site/side was marked, verified correct patient position, special equipment/implants available, medications/allergies/relevent history reviewed, required imaging and test results available.  Performed  Maximum sterile technique was used including antiseptics, cap, gloves, gown, hand hygiene, mask and sheet. Skin prep: Chlorhexidine; local anesthetic administered A antimicrobial bonded/coated triple lumen catheter was placed in the left subclavian vein using the Seldinger technique.  Air was aspirated during the procedure and PTX was noted by percussion, wayne catheter was placed  Evaluation Blood flow good Complications: No apparent complications Patient did tolerate procedure well. Chest X-ray ordered to verify placement.  CXR: pending.  [name] 06/23/2019, 11:35 AM

## 2019-06-23 NOTE — Progress Notes (Signed)
Pharmacy Antibiotic Note  Kelsey Wade is a 58 y.o. female admitted on 07/02/2019 with sepsis  Plan: Add vanc 1 g q48 h - may need to do redo AUC if SCr improves Sun Add zosyn 3.375 gm iv q8 Monitor renal fx cx vanc lvls prn  Height: 5\' 4"  (162.6 cm) Weight: 143 lb 8.3 oz (65.1 kg) IBW/kg (Calculated) : 54.7  Temp (24hrs), Avg:97.6 F (36.4 C), Min:97.6 F (36.4 C), Max:97.6 F (36.4 C)  Recent Labs  Lab 06/23/2019 1610 07/12/2019 1640 06/23/2019 2223 06/23/19 0212 06/23/19 0518  WBC  --  18.2*  --  17.3* 17.6*  CREATININE  --  1.80* 2.17* 2.55* 2.60*  LATICACIDVEN 2.6*  --  1.7 1.9  --     Estimated Creatinine Clearance: 20.9 mL/min (A) (by C-G formula based on SCr of 2.6 mg/dL (H)).    No Known Allergies  Levester Fresh, PharmD, BCPS, BCCCP Clinical Pharmacist [phone]  Please check AMION for all [place] numbers  06/23/2019 11:45 AM

## 2019-06-23 NOTE — ED Notes (Signed)
Dr. Marylyn Ishihara paged to [phone] Hassan Rowan, RN paged by Levada Dy

## 2019-06-23 NOTE — Progress Notes (Signed)
LTM maint complete - no skin breakdown under: CZ,C4,A1,FP2

## 2019-06-23 NOTE — Procedures (Signed)
Intubation Procedure Note Kelsey Wade [id number] July 11, 1961  Procedure: Intubation Indications: Airway protection and maintenance  Procedure Details Consent: Risks of procedure as well as the alternatives and risks of each were explained to the (patient/caregiver).  Consent for procedure obtained. Time Out: Verified patient identification, verified procedure, site/side was marked, verified correct patient position, special equipment/implants available, medications/allergies/relevent history reviewed, required imaging and test results available.  Performed  Maximum sterile technique was used including antiseptics, cap, gloves, gown, hand hygiene and mask.  MAC    Evaluation Hemodynamic Status: BP stable throughout; O2 sats: stable throughout Patient's Current Condition: stable Complications: No apparent complications Patient did tolerate procedure well. Chest X-ray ordered to verify placement.  CXR: pending.   [name] 06/23/2019

## 2019-06-23 NOTE — Progress Notes (Signed)
Notified that patient was in distress. At bedside, pt not directly responsive to verbal command. Found to be in sinus tach and shivering. Neurology at bedside. Concerned for airway protection. Notified PCCM. They evaluated and have taken to ICU for intubation and monitoring. TRH to resume care when stabilized/extubated.  General: 58 y.o. female resting in bed, in distress Cardiovascular: tachy, strong, sinus, +S1, S2, no m/g/r, equal pulses throughout Respiratory: clear but shallow Neuro: unresponsive to verbal commands, responsive to noxious stim  .Jonnie Finner, DO

## 2019-06-23 NOTE — Progress Notes (Signed)
Pt's chest tube noted to change from a 1 to a 2 after pt was bathed. Elink made aware. Will continue to assess.

## 2019-06-23 NOTE — Progress Notes (Signed)
CRITICAL VALUE ALERT  Critical Value:  Chloride >130  Date & Time Notied:  06/23/2019 @ 2245  Provider Notified: Warren Lacy

## 2019-06-23 NOTE — Procedures (Addendum)
Patient Name: Kelsey Wade  MRN: [id number]  Epilepsy Attending: Lora Havens  Referring Physician/Provider: Dr Remi Haggard Aroor Duration: 06/23/2019 0041 to 06/24/2019 0041  Patient history: 58yo F with h/o L MCA stroke and seizure who presented with ams. EEG to assess for status.   Level of alertness: lethargic  AEDs during EEG study: LEV,phenyoin ativan  Technical aspects: This EEG study was done with scalp electrodes positioned according to the 10-20 International system of electrode placement. Electrical activity was acquired at a sampling rate of 500Hz  and reviewed with a high frequency filter of 70Hz  and a low frequency filter of 1Hz . EEG data were recorded continuously and digitally stored.   DESCRIPTION: EEG showed 10 seizures with no clinical signs arising from right hemisphere, maximal right temporal region, lasting about 2.5 minutes. During the seizure, eeg initially showed sharp waves in right temporal region wich then evolved to involve the right hemisphere and increase in amplitude and frequency. After 10am, there were sharp waves with overriding fast activity at 0.5Hz  in right parieto-occipital region.  There was also showed continuous generalized 2-5hz  theta-delta slowing, maximal left frontotemporal region. Frequent spikes were also seen in left frontotemporal region, maximal T3>F7>T5.  Hyperventilation and photic stimulation were not performed.  ABNORMALITY: - Status epilepticus, right temporal - PLEDs right parieto-occipital - Spikes, left frontotemporal, maximal T3 - Continuous slow, generalized, maximal left frontotemporal   IMPRESSION: This study initially showed status epilepticus arising from right temporal region. Total of 10 seizures were seen between 06/23/2019 0227 to 0814 with no clinical signs, lasting avg 2.5 mins. There is evidence of epileptogenicity in right parieto-occipital region secondary to acute/ssubacute underlying etiology. There is also  evidence of independent epileptogenicity and cortical dysfunction in left frontotemporal region likely secondary to underlying infarct. Additionally, there was severe diffuse encephalopathy.   Dr Leonel Ramsay was notified intermittently.    Seizure onset   Seizure continues   Seizure end

## 2019-06-23 NOTE — Progress Notes (Signed)
LTM reviewed till 47. No further seizures since 0814am. Please review final eeg report for details.

## 2019-06-23 NOTE — Progress Notes (Addendum)
Subjective: Continues to have electrographic seizures intermittently.  Exam: Vitals:   06/23/19 0630 06/23/19 0645  BP: (!) 136/103 (!) 135/108  Pulse: (!) 102 (!) 101  Resp: (!) 23 (!) 24  Temp:    SpO2: 99% 98%   Gen: In bed, NAD Resp: non-labored breathing, no acute distress Abd: soft, nt  Neuro: MS: She does not open eyes or follow commands, no verbal output. 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  She continues to have intermittent seizures, therefore she needs more aggressive antiepileptic therapy.  She has renal dysfunction, and therefore I will need to decrease her Keppra dosing.  I would favor starting fosphenytoin and I will load with this now.  Recommendations: 1) decrease Keppra to 500 twice daily 2) fosphenytoin load 20/kg 3) continue LTM EEG  This patient is critically ill and at significant risk of neurological worsening, death and care requires frequent neurological assessment, EEG monitoring, discussion with family, other specialists and medical decision making of high complexity. I spent 40 minutes of neurocritical care time  in the care of  this patient. This was time spent independent of any time provided by nurse practitioner or PA.  Roland Rack, MD [facility] [phone]  If 7pm- 7am, please page neurology on call as listed in [place]. 06/23/2019  9:21 AM

## 2019-06-23 NOTE — ED Notes (Signed)
EEG in progress at bedside

## 2019-06-23 NOTE — Consult Note (Signed)
NAME:  Kelsey Wade, MRN:  [id number], DOB:  Sep 13, 1961, LOS: 1 ADMISSION DATE:  07/21/2019, CONSULTATION DATE:  06/23/19 REFERRING MD:  Roel Cluck - [facility] CHIEF COMPLAINT:  AMS, seizure  Brief History   58 yo F with seizure disorder, PRES, who fell 3 days ago and was brought in by husband 10/3.  In ED, patient with witnessed seizure like activity., given 4mg  Ativan 2g Keppra. Neurology and PCCM consulted.   History of present illness   Obtained from chart  58 yo F PMH EtOH abuse, Depression, L MCA CVA, PRES, Seizure disorder, HTN, HLD who presented to ED with CC AMS. Patient sustained fall 9/30 and has been weak, with poor PO intake (including no home meds x 3 days) and limited mobility since. 10/2 Husband dispatched EMS and patient brought to ED.  Presenting labs with hypernatremia likely in setting of poor PO intake. CT head with evidence of prior CVA, as well as edema. Patient was reportedly conversant in upon arrival ED. In ED, patient with sudden decline in mental status-- associated R gaze deviation and R lip twitching.  Patient given 4mg  Ativan, 2g Keppra.   Of note, patient admitted in April with SE -- at the time noted to have PRES. Home AED regimen includes Keppra, previously VPA as well. For past several days, patient has not taken Keppra.   Neurology and PCCM consulted for evaluation.   Past Medical History  CVA PRES Seizure disorder EtOH abuse HTN HLD GERD Tobacco use Anxiety Depression  Significant Hospital Events   10/2> presenting with AMS after fall 3 days prior 10/3 > seizure activity in ED. Given Keppra, ativan, neuro and PCCM to consult  Consults:  PCCM Neuro  Procedures:  EEG 10/3>>  Significant Diagnostic Tests:  EEG 10/3>>> CT H 10/2> cerebral edema R>L with interval improvement from prior exam, chronic encephalomalacia in L fronto-parietal region, chronic R ICA hyperdensity   Micro Data:  10/2 SARS Cov2> neg  Antimicrobials:  None   Interim history/subjective:  Seizures in the ER Cardiac arrest requiring 1 minute of CPR PCCM called back for arrest event  Objective   Blood pressure 112/82, pulse (!) 101, temperature 97.6 F (36.4 C), temperature source Oral, resp. rate (!) 24, height 5\' 4"  (1.626 m), weight 65.1 kg, SpO2 98 %.    No intake or output data in the 24 hours ending 06/23/19 0957 Filed Weights   06/28/2019 1631  Weight: 65.1 kg   Examination: General: Chronically ill appearing, unresponsive  HENT: NCAT. R eyelid droop. Injected sclera, dry mucosa Lungs: CTA bilaterally, not protecting her airway Cardiovascular: RRR, Nl S1/S2 and -M/R/G Abdomen: Soft, NT, ND and +BS Ext: -edema and -tenderness Neuro: completely unresponsive, not following commands  I reviewed CXR my self, no acute disease noted  Discussed with neuro and TRH-MD   Resolved Hospital Problem list     Assessment & Plan:   Seizure Disorder with recent AED non-compliance  Focal SE is resolved, patient now post-ictal  - Prior CVA - Prior PRES - Cerebral edema, some improvement on Ct head from prior - EtOH abuse Plan - Neuro has evaluated patient, appreciate input - Resume home Keppra - Further imagine per neuro - Continue seizure precautions - Maintain NPO, and elevated HOB  - Ativan for seizure control - Versed and fentanyl drips - CIWA protocol - Intubate and mechanically ventilate  I had an extensive discussion with the husband.  [place] with short term intubation but no CPR/cardioversion.  Will change to LCB.  Address hypernatremia.  Check cortisol level and start stress dose steroids.  Labs   CBC: Recent Labs  Lab 07/17/2019 1640 06/23/19 0212 06/23/19 0518  WBC 18.2* 17.3* 17.6*  NEUTROABS 14.6*  --   --   HGB 19.9* 19.4* 20.1*  HCT 61.4* 60.4* 65.8*  MCV 88.6 88.8 91.3  PLT 294 327 [id number]    Basic Metabolic Panel: Recent Labs  Lab 06/28/2019 1640 07/14/2019 2223 06/23/19 0212 06/23/19 0518  NA 157* 157* 156*  158*  K 4.1 3.9 4.3 5.1  CL 121* 122* 120* 120*  CO2 18* 21* 20* 17*  GLUCOSE 160* 211* 220* 179*  BUN 112* 123* 125* 137*  CREATININE 1.80* 2.17* 2.55* 2.60*  CALCIUM 9.7 9.6 9.6 9.5  MG  --   --  3.0* 3.0*  PHOS  --  4.9* 5.3* 5.1*   GFR: Estimated Creatinine Clearance: 20.9 mL/min (A) (by C-G formula based on SCr of 2.6 mg/dL (H)). Recent Labs  Lab 07/02/2019 1610 06/21/2019 1640 07/06/2019 2223 06/23/19 0212 06/23/19 0518  WBC  --  18.2*  --  17.3* 17.6*  LATICACIDVEN 2.6*  --  1.7 1.9  --     Liver Function Tests: Recent Labs  Lab 06/26/2019 1640 06/23/19 0212 06/23/19 0518  AST 22 20 23   ALT 27 25 25   ALKPHOS 81 82 94  BILITOT 1.1 1.0 0.7  PROT 7.7 7.8 7.3  ALBUMIN 3.7 3.7 3.4*   No results for input(s): LIPASE, AMYLASE in the last 168 hours. No results for input(s): AMMONIA in the last 168 hours.  ABG    Component Value Date/Time   PHART 7.415 01/03/2019 0227   PCO2ART 31.9 (L) 01/03/2019 0227   PO2ART 188.0 (H) 01/03/2019 0227   HCO3 19.9 (L) 01/03/2019 0227   TCO2 21 (L) 01/03/2019 0227   ACIDBASEDEF 2.0 01/03/2019 0227   O2SAT 100.0 01/03/2019 0227     Coagulation Profile: No results for input(s): INR, PROTIME in the last 168 hours.  Cardiac Enzymes: Recent Labs  Lab 07/09/2019 1640 06/23/19 0212  CKTOTAL 192 131    HbA1C: Hgb A1c MFr Bld  Date/Time Value Ref Range Status  07/14/2018 06:58 AM 5.9 (H) 4.8 - 5.6 % Final    Comment:    (NOTE) Pre diabetes:          5.7%-6.4% Diabetes:              >6.4% Glycemic control for   <7.0% adults with diabetes   03/03/2017 03:51 AM 5.5 4.8 - 5.6 % Final    Comment:    (NOTE)         Pre-diabetes: 5.7 - 6.4         Diabetes: >6.4         Glycemic control for adults with diabetes: <7.0     CBG: Recent Labs  Lab 07/20/2019 1641 06/23/19 0932  GLUCAP 162* 175*   The patient is critically ill with multiple organ systems failure and requires high complexity decision making for assessment and  support, frequent evaluation and titration of therapies, application of advanced monitoring technologies and extensive interpretation of multiple databases.   Critical Care Time devoted to patient care services described in this note is  60  Minutes. This time reflects time of care of this signee Dr [name]. This critical care time does not reflect procedure time, or teaching time or supervisory time of PA/NP/Med student/Med Resident etc but could involve care discussion time.  Rush Farmer, M.D. [facility]  Pulmonary/Critical Care Medicine. Pager: [phone]. After hours pager: [phone].

## 2019-06-23 NOTE — Progress Notes (Signed)
LTM reviewed till 1007am. Patient had 10 seizures between 0227 to 0814am. There were no clinical signs with the seizures and they lasted avg 2.5 mins. Dr Leonel Ramsay was notified and patient was loaded with IV Fosphenytoin at [place]. No further seizures since, last seizure was at 0814am.    Please review final EEG report for details.

## 2019-06-23 NOTE — Progress Notes (Signed)
EEG complete - results pending 

## 2019-06-23 NOTE — Procedures (Signed)
Chest Tube Insertion Procedure Note  Indications:  Clinically significant Pneumothorax  Pre-operative Diagnosis: Pneumothorax  Post-operative Diagnosis: Pneumothorax  Procedure Details  Informed consent was obtained for the procedure, including sedation.  Risks of lung perforation, hemorrhage, arrhythmia, and adverse drug reaction were discussed.   After sterile skin prep, using standard technique, a Wayne catheter was placed in the left lateral 7 rib space.  Findings: None  Estimated Blood Loss:  Minimal         Specimens:  None              Complications:  None; patient tolerated the procedure well.         Disposition: ICU - intubated and hemodynamically stable.         Condition: stable  Attending Attestation: I performed the procedure.

## 2019-06-23 NOTE — Progress Notes (Signed)
SLP Cancellation Note  Patient Details Name: Kelsey Wade MRN: [id number] DOB: 10/08/1960   Cancelled treatment:       Reason Eval/Treat Not Completed: Medical issues which prohibited therapy - orders received for BSE. Chart review indicates pt not appropriate for BSE at this time. Will continue efforts.  Hakiem Malizia B. Quentin Ore, [facility], [place] Speech Language Pathologist Office: [phone] Pager: [phone]  Shonna Chock 06/23/2019, 10:14 AM

## 2019-06-23 NOTE — ED Provider Notes (Addendum)
I was called to bedside after another possible seizure activity episode. Patient is maintaining airway, has a saturation of 96, 97%, without labored respiration. She is noninteractive, has just begun receiving her fosphenytoin loading, is undergoing continuous EEG. On chart review it seems as though the episode was similar to that last reported by proceeding care team.  It then becomes clear that the patient may have received a few moments of CPR, though it is unclear if pulses were truly lost or not, but, on exam, patient is tachycardic, appreciable in her radial pulse. Patient is being evaluated by neurology, admitting team, and critical care, who will take the patient to the ICU now, likely intubation pending.   Carmin Muskrat, MD 06/23/19 [id number]    Carmin Muskrat, MD 06/23/19 [phone]

## 2019-06-23 NOTE — Progress Notes (Signed)
Pt arrived on unit around 10am with continuous EEG and was intubated emergently, pt appeared to be seizing so ativan was given per verbal order from MD. Levo started per MD order and Central line was placed, along with chest tube. Chest tube was put to suction. OG placed and meds adjusted per pharmacy.   Levo was switched to central line.   Husband came up to visit pt and calmly stated "We had long conversations about this she doesn't want to be hooked up to anything, she's a DNR"   MD called and spoke with husband, husband stated the MD answered his questions and wife would want hospice if she gets any worse.   Pt is partial code now intubation only.  Will continue to monitor.

## 2019-06-23 NOTE — Progress Notes (Signed)
Met with husband.  No further escalation of care and family meeting in AM for plan of care.  This is not what the patient would have wanted.  Rush Farmer, M.D. [facility] Pulmonary/Critical Care Medicine. Pager: [phone]. After hours pager: [phone].

## 2019-06-23 NOTE — Progress Notes (Signed)
Initial Nutrition Assessment  DOCUMENTATION CODES:   Not applicable  INTERVENTION:   If tube feeds initiated, recommend:  Vital 1.2 @ 83ml/hr + Prostat 51ml daily via tube  Free water flushes 40ml q4 hours to maintain tube patency   Regimen provides 1396kcal/day, 96g/day protein, 1038ml/day free water  MVI, folic acid and thiamine daily in setting of etoh abuse  Pt at high refeed risk; recommend monitor K, Mg and P labs daily  NUTRITION DIAGNOSIS:   Inadequate oral intake related to inability to eat(pt sedated and ventilated) as evidenced by NPO status.  GOAL:   Provide needs based on ASPEN/SCCM guidelines  MONITOR:   Vent status, Labs, Weight trends, Skin, I & O's  REASON FOR ASSESSMENT:   Consult Assessment of nutrition requirement/status  ASSESSMENT:   58 year old woman with history of alcohol abuse, depression, left CVA, pres, seizure disorder, hypertension presenting with acute encephalopathy.  RD working remotely.  Pt sedated and ventilated. No OGT in place. No plans for tube feeds today. Per chart review, pt with poor appetite and oral intake for several days pta. RD suspects pt with poor appetite and oral intake at baseline r/t etoh abuse. Per chart, pt with weight gain pta. Tube feed recommendations above if tube feeds initiated. Pt is at high refeed risk.   Medications reviewed and include: plavix, fentanyl, folic acid, MVI, nicotine, thiamine, NaCl @75ml /hr, levophed, zosyn, vancomycin   Labs reviewed: Na 159(H), K 4.1 wnl, BUN 134(H), creat 2.63(H), P 5.1(H), Mg 3.0(H) Wbc- 17.6(H) cbgs- 175, 231 x 24 hrs AIC 5.9(H)- 06/2018  Patient is currently intubated on ventilator support MV: 8.8 L/min Temp (24hrs), Avg:97.4 F (36.3 C), Min:97.1 F (36.2 C), Max:97.6 F (36.4 C)  Propofol: none  MAP- >62mmHg  Unable to complete Nutrition-Focused physical exam at this time.   Diet Order:   Diet Order            Diet NPO time specified  Diet  effective now             EDUCATION NEEDS:   No education needs have been identified at this time  Skin:  Skin Assessment: Reviewed RN Assessment  Last BM:  unknown  Height:   Ht Readings from Last 1 Encounters:  07/01/2019 5\' 4"  (1.626 m)    Weight:   Wt Readings from Last 1 Encounters:  07/10/2019 65.1 kg    Ideal Body Weight:  54.5 kg  BMI:  Body mass index is 24.64 kg/m.  Estimated Nutritional Needs:   Kcal:  1319kcal/day  Protein:  85-95g/day  Fluid:  >1.6L/day  Koleen Distance MS, RD, LDN Pager #- [phone] Office#- [phone] After Hours Pager: [phone]

## 2019-06-23 NOTE — ED Provider Notes (Signed)
I was called to this patient's room for evaluation.  She is undergoing a continuous EEG.  Patient is resting comfortably with eyes open but does not respond to voice.  Nurse is concerned as patient will have episodes of rapid breathing that will slow.  No hypoxia. I discussed the case with Dr. Lorraine Lax with neurology He suspects this may be due to recent seizure activity.  May also be related to her metabolic dysfunction He will review EEG results.  Patient otherwise hemodynamically appropriate this time.   Ripley Fraise, MD 06/23/19 [phone]

## 2019-06-23 NOTE — Progress Notes (Signed)
Pulled the ETT back 2 cm per MD order. RT at bedside to assist me.

## 2019-06-23 NOTE — Consult Note (Signed)
Requesting Physician: Dr. Maryan Rued    Chief Complaint: right gaze deviation, seizure like activity   History obtained from: Patient and Chart     HPI:                                                                                                                                       Kelsey Wade is a 58 y.o. 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  Patient apparently had a fall at home 3 days ago and unable to get up from the bed apparently did not have anything to eat or drink.  Help patient's husband could not pick her up and he called EMS today who brought her to the hospital.  Labs showed hypernatremia likely due to dehydration.  CT head was obtained which redemonstrated old strokes and showed improved swelling from from prior study.  Patient on arrival was conversational.  However, while in the emergency room  patient's mental status suddenly declined.  EDP evaluated the patient and noted that she had a right gaze deviation and twitching of the right upper lip and administered 4 mg of Ativan and 1 g of Keppra.  She still remains lethargic and not following commands, but no longer appears to have forced gaze deviation.  Neurology was consulted due to concern for partial status epilepticus.  Patient is supposed to be on 1.5 g of Keppra twice daily however has not received the medication for the last 3 days.  The patient presented in April with status epilepticus, similar presentation and at that time noted to have posterior reversible encephalopathy syndrome in the setting of accelerated hypertension on MRI brain.  She initially was treated with Keppra and Depakote however on discharge Depakote was discontinued.   Past Medical History:  Diagnosis Date  . Anxiety   . Arthritis   .  Chronic back pain   . Depression   . ETOH abuse   . GERD (gastroesophageal reflux disease)   . HTN (hypertension)   . Hyperlipidemia   . Stroke [facility])    June 2018  . Tobacco abuse     Past Surgical History:  Procedure Laterality Date  . APPENDECTOMY      Family History  Problem Relation Age of Onset  . Alcohol abuse Father   . Alcohol abuse Brother   . Breast cancer Maternal Aunt   . Colon cancer Neg Hx   . Esophageal cancer Neg Hx   . Stomach cancer Neg Hx   . Rectal cancer Neg Hx    Social History:  reports that she has been smoking cigarettes. She has been smoking about 0.50 packs per day. She has never used smokeless tobacco. She reports current alcohol use of about 2.0 standard drinks of alcohol per week.  She reports current drug use. Drug: Marijuana.  Allergies: No Known Allergies  Medications:                                                                                                                        I reviewed home medications   ROS:                                                                                                                                     Unable to review systems due to patient's mental status.  Patient is afebrile.   Examination:                                                                                                      General: Appears well-developed and well-nourished.  Psych: Unable to assess due to mental status Eyes: No scleral injection HENT: No OP obstrucion Head: Normocephalic.  Cardiovascular: Normal rate and regular rhythm.  Respiratory: Effort normal and breath sounds normal to anterior ascultation GI: Soft.  No distension. There is no tenderness.  Skin: WDI    Neurological Examination Mental Status: Lethargic, grimaces to pain.  Does not track examiner.  Nonverbal. Cranial Nerves: II: Visual fields : Difficult to assess due to patient's significant lethargy III,IV, VI: ptosis not present, no  forced gaze deviation, oculocephalic reflex present, pupils equal, round, reactive to light and accommodation V,VII: No obvious facial symmetry, unable to test facial sensation VIII: hearing normal bilaterally IX,X: Gag reflex present XI: Unable to assess XII: Unable to assess Motor: Right upper extremity and right lower extremity spastic hemiparesis with minimal withdrawal.  Withdraws in the left upper and lower extremity to noxious stimulus Tone and bulk: Increased tone in the right upper and lower extremity Sensory: Unable to assess Plantars: Right: downgoing   Left: downgoing Cerebellar: Unable to assess      Lab Results: Basic Metabolic Panel: Recent Labs  Lab 07/12/2019 1640 07/11/2019 2223  NA 157* 157*  K 4.1 3.9  CL 121* 122*  CO2 18* 21*  GLUCOSE 160* 211*  BUN 112* 123*  CREATININE 1.80* 2.17*  CALCIUM 9.7 9.6  PHOS  --  4.9*    CBC: Recent Labs  Lab 07/20/2019 1640  WBC 18.2*  NEUTROABS 14.6*  HGB 19.9*  HCT 61.4*  MCV 88.6  PLT 294    Coagulation Studies: No results for input(s): LABPROT, INR in the last 72 hours.  Imaging: Dg Pelvis 1-2 Views  Result Date: 06/23/2019 CLINICAL DATA:  Pain after multiple falls. EXAM: PELVIS - 1-2 VIEW COMPARISON:  None. FINDINGS: There is no evidence of pelvic fracture or diastasis. No pelvic bone lesions are seen. IMPRESSION: Negative. Electronically Signed   By: Marijo Conception M.D.   On: 07/04/2019 16:43   Ct Head Wo Contrast  Result Date: 07/16/2019 CLINICAL DATA:  Altered level of consciousness EXAM: CT HEAD WITHOUT CONTRAST TECHNIQUE: Contiguous axial images were obtained from the base of the skull through the vertex without intravenous contrast. COMPARISON:  January 02, 2019 CT, MRI January 03, 2019 FINDINGS: Brain: Again noted is diffuse bilateral white matter hypodensity, right greater than left, which appears slightly improved since the prior exam. No new areas of infarct or hypodensity are seen. There is scattered  areas of encephalomalacia involving the left frontoparietal lobe. No new extra-axial collection are seen. The ventricles are unchanged in size and contour. Vascular: Unchanged hyperdense right ICA at the cavernous sinus, consistent with chronic thrombosis. Skull: The skull is intact. No fracture or focal lesion identified. Sinuses/Orbits: The visualized paranasal sinuses and mastoid air cells are clear. The orbits and globes intact. Other: None IMPRESSION: 1. bilateral diffuse areas of edema , right greater than left slightly improved since the prior exam. 2. Chronic areas of encephalomalacia in the left frontoparietal lobe. 3. Stable hyperdense right ICA, consistent with chronic thrombosis. Electronically Signed   By: Prudencio Pair M.D.   On: 06/26/2019 19:33   Dg Chest Port 1 View  Result Date: 07/18/2019 CLINICAL DATA:  Shortness of breath. EXAM: PORTABLE CHEST 1 VIEW COMPARISON:  Radiographs of January 03, 2019. FINDINGS: The heart size and mediastinal contours are within normal limits. Both lungs are clear. No pneumothorax or pleural effusion is noted. The visualized skeletal structures are unremarkable. IMPRESSION: No active disease. Electronically Signed   By: Marijo Conception M.D.   On: 07/02/2019 16:42     I have reviewed the above imaging    ASSESSMENT AND PLAN  58 y.o. 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  Acute encephalopathy likely due to dehydration however is also possible patient may have had seizures as well.  She had missed several days of Keppra which could be a trigger for seizures.  While in the ER, examination described by EDP was very concerning for partial status epilepticus and similar to her prior presentation of seizures. She was loaded with 4 mg of Ativan and 1 mg of  Keppra.  Recommended additional gram of Keppra and stat EEG.  Stat EEG negative for subclinical seizures.  However given her presentation and that she had just received AEDs prior to EEG, will continue long-term EEG monitoring overnight to make sure she does not have any breakthrough seizures.   Focal status epilepticus: resolved Seizure with postictal state History of left MCA stroke and PRES   Recommendations Stat EEG: Left  frontotemporal sharps but no ongoing seizures and routine recording.  Will continue EEG overnight Continue Keppra 1.5 g BID Seizure precautions Follow-up on urine analysis and urine drug screen No need to repeat MRI brain if patient returns back to baseline tomorrow  CRITICAL CARE Performed by: Lanice Schwab Aroor   Total critical care time: 40  minutes  Critical care time was exclusive of separately billable procedures and treating other patients.  Critical care was necessary to treat or prevent imminent or life-threatening deterioration.  Critical care was time spent personally by me on the following activities: development of treatment plan with patient and/or surrogate as well as nursing, discussions with consultants, evaluation of patient's response to treatment, examination of patient, obtaining history from patient or surrogate, ordering and performing treatments and interventions, ordering and review of laboratory studies, ordering and review of radiographic studies, pulse oximetry and re-evaluation of patient's condition.  The patient is neurologically critically ill due to focal status epilepticus, resolved after receiving Ativan and Keppra.   Sushanth Aroor [facility] Pager Number [phone]

## 2019-06-23 NOTE — Progress Notes (Addendum)
Patient was emergently brought to the ICU.  She became bradycardic and was intubated emergently.  Became hemodynamically unstable and central line was placed emergently for pressor use during which air was aspirated and PTX was noted by exam and an emergent wayne catheter was placed.  CXR pending.  Will f/u.  Pan culture.  Check CPT and begin broad spectrum abx.  Rush Farmer, M.D. [facility] Pulmonary/Critical Care Medicine. Pager: [phone]. After hours pager: [phone].

## 2019-06-23 NOTE — Progress Notes (Signed)
Attempted to call husband for consent. Cell number is unavailable and home number is busy.

## 2019-06-23 NOTE — Progress Notes (Signed)
10:17 1 amp of EPi 10:20 2 mg Versed 10:20 2 mg  Ativan 10:22 2 mg Ativan   All verbal orders by Dr. Nelda Marseille. RSI kit pulled and 20 of Amidate and 100 mg of Fentanyl drawn up but not used. 100 mg Fent wasted in pyxis  20 of Amidate WIS. Witnessed by [employer], RN

## 2019-06-23 NOTE — Progress Notes (Signed)
LTM EEG hooked up and running - no initial skin breakdown - push button tested - neuro notified.  

## 2019-06-23 NOTE — Progress Notes (Signed)
Suctioned pt for scant sputum, not enough to send to lab for sputum culture.  RN aware.

## 2019-06-23 NOTE — Consult Note (Signed)
NAME:  Kelsey Wade, MRN:  [id number], DOB:  1961/02/05, LOS: 1 ADMISSION DATE:  07/14/2019, CONSULTATION DATE:  06/23/19 REFERRING MD:  Roel Cluck - [facility] CHIEF COMPLAINT:  AMS, seizure  Brief History   58 yo F with seizure disorder, PRES, who fell 3 days ago and was brought in by husband 10/3.  In ED, patient with witnessed seizure like activity., given 4mg  Ativan 2g Keppra. Neurology and PCCM consulted.   History of present illness   Obtained from chart  58 yo F PMH EtOH abuse, Depression, L MCA CVA, PRES, Seizure disorder, HTN, HLD who presented to ED with CC AMS. Patient sustained fall 9/30 and has been weak, with poor PO intake (including no home meds x 3 days) and limited mobility since. 10/2 Husband dispatched EMS and patient brought to ED.  Presenting labs with hypernatremia likely in setting of poor PO intake. CT head with evidence of prior CVA, as well as edema. Patient was reportedly conversant in upon arrival ED. In ED, patient with sudden decline in mental status-- associated R gaze deviation and R lip twitching.  Patient given 4mg  Ativan, 2g Keppra.   Of note, patient admitted in April with SE -- at the time noted to have PRES. Home AED regimen includes Keppra, previously VPA as well. For past several days, patient has not taken Keppra.   Neurology and PCCM consulted for evaluation.    Past Medical History  CVA PRES Seizure disorder EtOH abuse HTN HLD GERD Tobacco use Anxiety Depression  Significant Hospital Events   10/2> presenting with AMS after fall 3 days prior 10/3 > seizure activity in ED. Given Keppra, ativan, neuro and PCCM to consult  Consults:  PCCM Neuro  Procedures:  EEG 10/3>>  Significant Diagnostic Tests:  EEG 10/3>>> CT H 10/2> cerebral edema R>L with interval improvement from prior exam, chronic encephalomalacia in L fronto-parietal region, chronic R ICA hyperdensity   Micro Data:  10/2 SARS Cov2> neg  Antimicrobials:    Interim  history/subjective:  Seizure in ED-- given 4mg  ativan and 1 g Keppra initially. Additional 1g keppra given at recommendation of neurology for total 2g keppra  Post-ictal state, non-verbal  Objective   Blood pressure (!) 115/94, pulse (!) 104, temperature 97.6 F (36.4 C), temperature source Oral, resp. rate (!) 31, height 5\' 4"  (1.626 m), weight 65.1 kg, SpO2 97 %.       No intake or output data in the 24 hours ending 06/23/19 0111 Filed Weights   06/30/2019 1631  Weight: 65.1 kg    Examination: General: chronically ill, frail appearing adult F, supine in bed on EEG HENT: NCAT. R eyelid droop. Injected sclera.  Lungs: CTA bilaterally. Symmetrical chest expansion, no tachypnea, no accessory muscle use  Cardiovascular: RRR s1s2 no rgm. Cap refill < 3 sec BUE BLE  Abdomen: flat, soft, ndnt, normoactive x4 Extremities: RUE with increased spacticity.  BLE symmetrical bulk and tone. No cyanosis or clubbing Neuro: Lethargic, grimaces to pain. Non-verbal and is not tracking Skin: clean, dry, warm. Scattered abrasions on BLE.   Resolved Hospital Problem list     Assessment & Plan:   Seizure Disorder with recent AED non-compliance  Focal SE is resolved, patient now post-ictal  -prior CVA -prior PRES -Cerebral edema, some improvement on Ct head from prior -EtOH abuse Plan Neuro has evaluated patient, appreciate and agree with recs Agree with EEG Resume home Keppra Further imagine per neuro Continue seizure precautions Maintain NPO, and elevated HOB  Monitor for airway  protection PRN Ativan is ordered, CIWA orders  Check mag  Correction of metabolic abnormalities per primary Follow up UDS  Agree with consult to social work   -At time of evaluation, patient appears post-ictal but is protecting airway and is hemodynamically stable. Ok to proceed with admission to hospitalist service, would recommend progressive bed. -At this time we will sign off-- please re-engage if needed      Best practice:  Diet: NPO  Pain/Anxiety/Delirium protocol (if indicated): PRN ativan  VAP protocol (if indicated): na  DVT prophylaxis: home plavix  GI prophylaxis: na Glucose control: na Mobility: BR  Code Status: Full  Family Communication: Per primary  Disposition: Progressive   Labs   CBC: Recent Labs  Lab 07/09/2019 1640  WBC 18.2*  NEUTROABS 14.6*  HGB 19.9*  HCT 61.4*  MCV 88.6  PLT [id number]    Basic Metabolic Panel: Recent Labs  Lab 07/04/2019 1640 06/24/2019 2223  NA 157* 157*  K 4.1 3.9  CL 121* 122*  CO2 18* 21*  GLUCOSE 160* 211*  BUN 112* 123*  CREATININE 1.80* 2.17*  CALCIUM 9.7 9.6  PHOS  --  4.9*   GFR: Estimated Creatinine Clearance: 25 mL/min (A) (by C-G formula based on SCr of 2.17 mg/dL (H)). Recent Labs  Lab 07/10/2019 1610 07/08/2019 1640 07/06/2019 2223  WBC  --  18.2*  --   LATICACIDVEN 2.6*  --  1.7    Liver Function Tests: Recent Labs  Lab 06/28/2019 1640  AST 22  ALT 27  ALKPHOS 81  BILITOT 1.1  PROT 7.7  ALBUMIN 3.7   No results for input(s): LIPASE, AMYLASE in the last 168 hours. No results for input(s): AMMONIA in the last 168 hours.  ABG    Component Value Date/Time   PHART 7.415 01/03/2019 0227   PCO2ART 31.9 (L) 01/03/2019 0227   PO2ART 188.0 (H) 01/03/2019 0227   HCO3 19.9 (L) 01/03/2019 0227   TCO2 21 (L) 01/03/2019 0227   ACIDBASEDEF 2.0 01/03/2019 0227   O2SAT 100.0 01/03/2019 0227     Coagulation Profile: No results for input(s): INR, PROTIME in the last 168 hours.  Cardiac Enzymes: Recent Labs  Lab 07/15/2019 1640  CKTOTAL 192    HbA1C: Hgb A1c MFr Bld  Date/Time Value Ref Range Status  07/14/2018 06:58 AM 5.9 (H) 4.8 - 5.6 % Final    Comment:    (NOTE) Pre diabetes:          5.7%-6.4% Diabetes:              >6.4% Glycemic control for   <7.0% adults with diabetes   03/03/2017 03:51 AM 5.5 4.8 - 5.6 % Final    Comment:    (NOTE)         Pre-diabetes: 5.7 - 6.4         Diabetes: >6.4          Glycemic control for adults with diabetes: <7.0     CBG: Recent Labs  Lab 07/20/2019 1641  GLUCAP 162*    Review of Systems:   Unable to obtain due to mental status (post-ictal)   Past Medical History  She,  has a past medical history of Anxiety, Arthritis, Chronic back pain, Depression, ETOH abuse, GERD (gastroesophageal reflux disease), HTN (hypertension), Hyperlipidemia, Stroke ([place]), and Tobacco abuse.   Surgical History    Past Surgical History:  Procedure Laterality Date  . APPENDECTOMY       Social History   reports that she has  been smoking cigarettes. She has been smoking about 0.50 packs per day. She has never used smokeless tobacco. She reports current alcohol use of about 2.0 standard drinks of alcohol per week. She reports current drug use. Drug: Marijuana.   Family History   Her family history includes Alcohol abuse in her brother and father; Breast cancer in her maternal aunt. There is no history of Colon cancer, Esophageal cancer, Stomach cancer, or Rectal cancer.   Allergies No Known Allergies   Home Medications  Prior to Admission medications   Medication Sig Start Date End Date Taking? Authorizing Provider  albuterol (PROAIR HFA) 108 (90 Base) MCG/ACT inhaler Inhale 2 puffs into the lungs every 6 (six) hours as needed for wheezing or shortness of breath. Dx..J44.1 01/08/19  Yes Emokpae, Courage, MD  aspirin EC 325 MG tablet Take 325 mg by mouth daily.   Yes [provider]  atorvastatin (LIPITOR) 40 MG tablet Take 1 tablet (40 mg total) by mouth daily. 01/08/19 01/08/20 Yes Emokpae, Courage, MD  baclofen 5 MG TABS Take 5 mg by mouth 2 (two) times daily. 01/08/19  Yes Emokpae, Courage, MD  famotidine (PEPCID) 20 MG tablet Take 1 tablet (20 mg total) by mouth daily. Patient taking differently: Take 20 mg by mouth at bedtime.  01/08/19 01/08/20 Yes Emokpae, Courage, MD  gabapentin (NEURONTIN) 300 MG capsule Take 300 mg by mouth 3 (three) times daily.    Yes  [provider]  Multiple Vitamin (MULTIVITAMIN WITH MINERALS) TABS tablet Take 1 tablet by mouth daily. Patient taking differently: Take 1 tablet by mouth daily. Centrum 01/07/18  Yes Samuella Cota, MD  oxyCODONE (ROXICODONE) 15 MG immediate release tablet Take 15 mg by mouth 4 (four) times daily.   Yes [provider]  polyethylene glycol (MIRALAX / GLYCOLAX) 17 g packet Take 17 g by mouth daily as needed for moderate constipation or severe constipation. 01/08/19  Yes Emokpae, Courage, MD  venlafaxine XR (EFFEXOR-XR) 75 MG 24 hr capsule Take 150 mg by mouth daily with breakfast.   Yes [provider]  vitamin B-12 (CYANOCOBALAMIN) 1000 MCG tablet Take 1,000 mcg by mouth daily.   Yes [provider]  amLODipine (NORVASC) 10 MG tablet Take 1 tablet (10 mg total) by mouth daily. Patient not taking: Reported on 07/12/2019 01/08/19   Roxan Hockey, MD  carvedilol (COREG) 3.125 MG tablet Take 1 tablet (3.125 mg total) by mouth 2 (two) times daily with a meal. Patient not taking: Reported on 07/05/2019 01/08/19   Roxan Hockey, MD  clopidogrel (PLAVIX) 75 MG tablet Take 1 tablet (75 mg total) by mouth daily. Patient not taking: Reported on 07/08/2019 01/09/19   Roxan Hockey, MD  folic acid (FOLVITE) 1 MG tablet Take 1 tablet (1 mg total) by mouth daily. Patient not taking: Reported on 07/10/2019 01/09/19   Roxan Hockey, MD  hydrOXYzine (ATARAX/VISTARIL) 25 MG tablet Take 1 tablet (25 mg total) by mouth 3 (three) times daily as needed for itching, anxiety or nausea. Patient not taking: Reported on 07/07/2019 01/08/19   Roxan Hockey, MD  levETIRAcetam (KEPPRA) 500 MG tablet Take 1 tablet (500 mg total) by mouth 2 (two) times daily. Patient not taking: Reported on 07/09/2019 07/16/18 07/17/2019  Damita Lack, MD  levETIRAcetam (KEPPRA) 750 MG tablet Take 2 tablets (1,500 mg total) by mouth 2 (two) times daily. Patient not taking: Reported on 06/30/2019  01/08/19   Roxan Hockey, MD  nicotine (NICODERM CQ - DOSED IN MG/24 HOURS) 14 mg/24hr  patch Place 1 patch (14 mg total) onto the skin daily. Patient not taking: Reported on 06/25/2019 01/09/19   Roxan Hockey, MD  thiamine 100 MG tablet Take 1 tablet (100 mg total) by mouth daily. Patient not taking: Reported on 07/14/2019 01/09/19   Roxan Hockey, MD  venlafaxine XR (EFFEXOR-XR) 150 MG 24 hr capsule Take 1 capsule (150 mg total) by mouth daily with breakfast. Patient not taking: Reported on 07/12/2019 01/08/19 01/08/20  Roxan Hockey, MD  Vitamin D, Ergocalciferol, (DRISDOL) 1.25 MG (50000 UT) CAPS capsule Take 1 capsule (50,000 Units total) by mouth every Saturday. Patient not taking: Reported on 06/30/2019 01/13/19   Roxan Hockey, MD       Eliseo Gum MSN, AGACNP-BC [place] [id number] If no answer, [id number] 06/23/2019, 1:11 AM

## 2019-06-23 NOTE — Procedures (Signed)
Patient Name: Kelsey Wade  MRN: [id number]  Epilepsy Attending: Lora Havens  Referring Physician/Provider: Dr Remi Haggard Aroor Date: 07/07/2019 Duration: 27.16 mins  Patient history: 58yo F with h/o L MCA stroke and seizure who presented with ams. EEG to assess for status.   Level of alertness: lethargic  AEDs during EEG study: LEV, ativan  Technical aspects: This EEG study was done with scalp electrodes positioned according to the 10-20 International system of electrode placement. Electrical activity was acquired at a sampling rate of 500Hz  and reviewed with a high frequency filter of 70Hz  and a low frequency filter of 1Hz . EEG data were recorded continuously and digitally stored.   DESCRIPTION: EEG showed continuous generalized 2-5hz  theta-delta slowing, maximal left frontotemporal region. Frequent spikes were seen in left frontotemporal region, maximal T3>F7>T5. Sharo transient were also seen in right temporo-occipital region. Hyperventilation and photic stimulation were not performed.  ABNORMALITY: - Continuous slow, generalized, maximal left frontotemporal - Spikes, left frontotemporal, maximal T3   IMPRESSION: This study showed evidence of epileptogenicity and cortical dysfunction in left frontotemporal region likely secondary to underlying infarct. No definite seizures  were seen throughout the recording. Additionally, there was severe diffuse encephalopathy, likely secondary to medications.

## 2019-06-24 ENCOUNTER — Inpatient Hospital Stay (HOSPITAL_COMMUNITY): Payer: Medicaid Other

## 2019-06-24 ENCOUNTER — Encounter (HOSPITAL_COMMUNITY): Payer: Medicaid Other

## 2019-06-24 DIAGNOSIS — Z9911 Dependence on respirator [ventilator] status: Secondary | ICD-10-CM

## 2019-06-24 DIAGNOSIS — L8990 Pressure ulcer of unspecified site, unspecified stage: Secondary | ICD-10-CM | POA: Insufficient documentation

## 2019-06-24 DIAGNOSIS — G92 Toxic encephalopathy: Secondary | ICD-10-CM

## 2019-06-24 LAB — PHOSPHORUS: Phosphorus: 4.8 mg/dL — ABNORMAL HIGH (ref 2.5–4.6)

## 2019-06-24 LAB — BASIC METABOLIC PANEL
BUN: 116 mg/dL — ABNORMAL HIGH (ref 6–20)
CO2: 19 mmol/L — ABNORMAL LOW (ref 22–32)
Calcium: 8.1 mg/dL — ABNORMAL LOW (ref 8.9–10.3)
Chloride: >130 mmol/L (ref 98–111)
Creatinine, Ser: 2.29 mg/dL — ABNORMAL HIGH (ref 0.44–1.00)
GFR calc Af Amer: 27 mL/min — ABNORMAL LOW (ref >60)
GFR calc non Af Amer: 23 mL/min — ABNORMAL LOW (ref >60)
Glucose, Bld: 213 mg/dL — ABNORMAL HIGH (ref 70–99)
Potassium: 3.5 mmol/L (ref 3.5–5.1)
Sodium: 159 mmol/L — ABNORMAL HIGH (ref 135–145)

## 2019-06-24 LAB — CBC
HCT: 47 % — ABNORMAL HIGH (ref 36.0–46.0)
Hemoglobin: 14.3 g/dL (ref 12.0–15.0)
MCH: 28 pg (ref 26.0–34.0)
MCHC: 30.4 g/dL (ref 30.0–36.0)
MCV: 92 fL (ref 80.0–100.0)
Platelets: 208 10*3/uL (ref 150–400)
RBC: 5.11 MIL/uL (ref 3.87–5.11)
RDW: 15.7 % — ABNORMAL HIGH (ref 11.5–15.5)
WBC: 18.9 10*3/uL — ABNORMAL HIGH (ref 4.0–10.5)
nRBC: 0 % (ref 0.0–0.2)

## 2019-06-24 LAB — POCT I-STAT 7, (LYTES, BLD GAS, ICA,H+H)
Acid-base deficit: 7 mmol/L — ABNORMAL HIGH (ref 0.0–2.0)
Bicarbonate: 15.5 mmol/L — ABNORMAL LOW (ref 20.0–28.0)
Calcium, Ion: 1.24 mmol/L (ref 1.15–1.40)
HCT: 39 % (ref 36.0–46.0)
Hemoglobin: 13.3 g/dL (ref 12.0–15.0)
O2 Saturation: 94 %
Patient temperature: 97.6
Potassium: 3.4 mmol/L — ABNORMAL LOW (ref 3.5–5.1)
Sodium: 157 mmol/L — ABNORMAL HIGH (ref 135–145)
TCO2: 16 mmol/L — ABNORMAL LOW (ref 22–32)
pCO2 arterial: 23.7 mmHg — ABNORMAL LOW (ref 32.0–48.0)
pH, Arterial: 7.421 (ref 7.350–7.450)
pO2, Arterial: 67 mmHg — ABNORMAL LOW (ref 83.0–108.0)

## 2019-06-24 LAB — GLUCOSE, CAPILLARY
Glucose-Capillary: 206 mg/dL — ABNORMAL HIGH (ref 70–99)
Glucose-Capillary: 218 mg/dL — ABNORMAL HIGH (ref 70–99)

## 2019-06-24 LAB — PHENYTOIN LEVEL, TOTAL: Phenytoin Lvl: 20 ug/mL (ref 10.0–20.0)

## 2019-06-24 LAB — MAGNESIUM: Magnesium: 2.4 mg/dL (ref 1.7–2.4)

## 2019-06-24 LAB — PROCALCITONIN: Procalcitonin: 0.2 ng/mL

## 2019-06-24 MED ORDER — DEXTROSE 5 % IV SOLN
INTRAVENOUS | Status: DC
  Administered 2019-06-24: 06:00:00 via INTRAVENOUS

## 2019-06-24 MED ORDER — GLYCOPYRROLATE 1 MG PO TABS
1.0000 mg | ORAL_TABLET | ORAL | Status: DC | PRN
  Filled 2019-06-24: qty 1

## 2019-06-24 MED ORDER — ACETAMINOPHEN 325 MG PO TABS: 650.0000 mg | ORAL_TABLET | Freq: Four times a day (QID) | ORAL | Status: DC | PRN

## 2019-06-24 MED ORDER — FREE WATER
200.0000 mL | Freq: Three times a day (TID) | Status: DC
  Administered 2019-06-24: 200 mL

## 2019-06-24 MED ORDER — SODIUM CHLORIDE 0.9 % IV SOLN: 1.0000 g | INTRAVENOUS | Status: DC

## 2019-06-24 MED ORDER — LACTATED RINGERS IV BOLUS
1000.0000 mL | Freq: Once | INTRAVENOUS | Status: AC
  Administered 2019-06-24: 10:00:00 1000 mL via INTRAVENOUS

## 2019-06-24 MED ORDER — FENTANYL 2500MCG IN NS 250ML (10MCG/ML) PREMIX INFUSION
0.0000 ug/h | INTRAVENOUS | Status: DC
  Administered 2019-06-24: 50 ug/h via INTRAVENOUS
  Filled 2019-06-24: qty 250

## 2019-06-24 MED ORDER — FAMOTIDINE 20 MG PO TABS
20.0000 mg | ORAL_TABLET | Freq: Every day | ORAL | Status: DC
  Administered 2019-06-24: 20 mg
  Filled 2019-06-24: qty 1

## 2019-06-24 MED ORDER — FENTANYL BOLUS VIA INFUSION
100.0000 ug | INTRAVENOUS | Status: DC | PRN
  Administered 2019-06-24: 18:00:00 100 ug via INTRAVENOUS
  Filled 2019-06-24: qty 100

## 2019-06-24 MED ORDER — POTASSIUM CHLORIDE 20 MEQ PO PACK
40.0000 meq | PACK | Freq: Once | ORAL | Status: AC
  Administered 2019-06-24: 40 meq
  Filled 2019-06-24: qty 2

## 2019-06-24 MED ORDER — SODIUM CHLORIDE 0.9 % IV SOLN
2.0000 g | Freq: Two times a day (BID) | INTRAVENOUS | Status: DC
  Administered 2019-06-24: 2 g via INTRAVENOUS
  Filled 2019-06-24: qty 20

## 2019-06-24 MED ORDER — VANCOMYCIN HCL IN DEXTROSE 750-5 MG/150ML-% IV SOLN
750.0000 mg | INTRAVENOUS | Status: DC
  Administered 2019-06-24: 750 mg via INTRAVENOUS
  Filled 2019-06-24 (×2): qty 150

## 2019-06-24 MED ORDER — GLYCOPYRROLATE 0.2 MG/ML IJ SOLN: 0.2000 mg | INTRAMUSCULAR | Status: DC | PRN

## 2019-06-24 MED ORDER — VANCOMYCIN HCL 10 G IV SOLR: 1250.0000 mg | INTRAVENOUS | Status: DC

## 2019-06-24 MED ORDER — FENTANYL CITRATE (PF) 100 MCG/2ML IJ SOLN: 50.0000 ug | INTRAMUSCULAR | Status: DC | PRN

## 2019-06-24 MED ORDER — MIDAZOLAM HCL 2 MG/2ML IJ SOLN
2.0000 mg | INTRAMUSCULAR | Status: DC | PRN
  Administered 2019-06-24: 17:00:00 4 mg via INTRAVENOUS
  Filled 2019-06-24: qty 4

## 2019-06-24 MED ORDER — ACETAMINOPHEN 650 MG RE SUPP: 650.0000 mg | Freq: Four times a day (QID) | RECTAL | Status: DC | PRN

## 2019-06-24 MED ORDER — FREE WATER: 200.0000 mL | Status: DC

## 2019-06-24 MED ORDER — HEPARIN SODIUM (PORCINE) 5000 UNIT/ML IJ SOLN
5000.0000 [IU] | Freq: Three times a day (TID) | INTRAMUSCULAR | Status: DC
  Administered 2019-06-24: 5000 [IU] via SUBCUTANEOUS
  Filled 2019-06-24: qty 1

## 2019-06-24 MED ORDER — INSULIN GLARGINE 100 UNIT/ML ~~LOC~~ SOLN
8.0000 [IU] | Freq: Every day | SUBCUTANEOUS | Status: DC
  Administered 2019-06-24: 8 [IU] via SUBCUTANEOUS
  Filled 2019-06-24: qty 0.08

## 2019-06-24 NOTE — Consult Note (Signed)
NAME:  Kelsey Wade, MRN:  [id number], DOB:  Jan 30, 1961, LOS: 2 ADMISSION DATE:  06/26/2019, CONSULTATION DATE:  06/23/19 REFERRING MD:  Roel Cluck - [facility] CHIEF COMPLAINT:  AMS, seizure  Brief History   58 yo F with seizure disorder, PRES, who fell 3 days ago and was brought in by husband 10/3. Initially more responsive in the ED. Seizure x1 in the ER, Cardiac arrest requiring 1 minute of CPR given 4mg  Ativan 2g Keppra. Neurology and PCCM consulted.  Intubated upon arrival to the ICU Iatrogenic left pneumothorax s/p no more chest tube  History of present illness   Obtained from chart  58 yo F PMH EtOH abuse, Depression, L MCA CVA, PRES, Seizure disorder, HTN, HLD who presented to ED with CC AMS. Patient sustained fall 9/30 and has been weak, with poor PO intake (including no home meds x 3 days) and limited mobility since. 10/2 Husband dispatched EMS and patient brought to ED.  Presenting labs with hypernatremia likely in setting of poor PO intake. CT head with evidence of prior CVA, as well as edema. Patient was reportedly conversant in upon arrival ED. In ED, patient with sudden decline in mental status-- associated R gaze deviation and R lip twitching.  Patient given 4mg  Ativan, 2g Keppra.   Of note, patient admitted in April with SE -- at the time noted to have PRES. Home AED regimen includes Keppra, previously VPA as well. For past several days, patient has not taken Keppra.   Neurology and PCCM consulted for evaluation.   Past Medical History  CVA PRES Seizure disorder EtOH abuse HTN HLD GERD Tobacco use Anxiety Depression  Significant Hospital Events   10/2> presenting with AMS after fall 3 days prior 10/3 > seizure activity in ED. Given Keppra, ativan, neuro and PCCM to consult  Consults:  PCCM Neuro  Procedures:  EEG 10/3>> no seizures per Neuro  Significant Diagnostic Tests:  EEG 10/3>>> CT H 10/2> cerebral edema R>L with interval improvement from prior exam,  chronic encephalomalacia in L fronto-parietal region, chronic R ICA hyperdensity   Micro Data:  10/2 SARS Cov2> neg  Antimicrobials:  Pip-tazo  10/3 vanc 10/3  Interim history/subjective:  Clinical suspicion for a seizure overnight, but not confirmed on EEG Has been coming in to discuss goals of care this morning.  He has previously expressed that she would not have wanted aggressive care measures and that he has concerns about his ability to care for her.  Objective   Blood pressure 103/85, pulse 80, temperature (!) 97.5 F (36.4 C), temperature source Oral, resp. rate 18, height 5\' 4"  (1.626 m), weight 65.1 kg, SpO2 100 %. Vent Mode: PRVC FiO2 (%):  [35 %-100 %] 35 % Set Rate:  [18 bmp] 18 bmp Vt Set:  [430 mL] 430 mL PEEP:  [5 cmH20] 5 cmH20 Plateau Pressure:  [11 cmH20-15 cmH20] 14 cmH20   Intake/Output Summary (Last 24 hours) at 06/24/2019 0858 Last data filed at 06/24/2019 0800 Gross per 24 hour  Intake 1933.96 ml  Output 555 ml  Net 1378.96 ml   Filed Weights   06/26/2019 1631  Weight: 65.1 kg   Examination: General: Chronically ill-appearing, cachectic woman laying in bed not sedated, intubated HENT: normocephalic, atraumatic, eyes anicteric Lungs: Clear to auscultation bilaterally, minimal cough.  Left chest tube with small amount of bloody drainage, tidaling, intermittent air leak. Cardiovascular: Regular rate and rhythm, no murmurs Abdomen: Soft, nontender, nondistended Ext: No significant edema.  Very cyanotic distal left digits.  Difficult to palpate left radial and ulnar pulses.  Fixed contracture of right hand and wrist. Neuro: RASS -5 on no sedation, grimaces to stimulation, delayed oculocephalic reflexes, intact pupillary reflexes.  Breathing over the ventilator.   Resolved Hospital Problem list     Assessment & Plan:   Acute hypoxic respiratory failure due to acute encephalopathy, likely postictal confusion. -Continue low tidal volume ventilation, 8  cc/kg ideal body weight, goal plateau pressure less than 30 and driving pressure less than 15. -SAT and SBT daily; on no sedation currently - VAP prevention protocol - Pepcid for GI prophylaxis  Iatrogenic pneumothorax - Continue chest tube to suction  Seizure Disorder with recent AED non-compliance.  History of CVA. Focal SE is resolved, patient now post-ictal  Significant azotemia likely affecting neuro status - Prior PRES - Cerebral edema, some improvement on Ct head from prior - EtOH abuse Plan -Appreciate neurology's input; they are planning to continue 24-hour EEG for another day -Continue PTA Keppra, vimpat, phenytoin - Ativan PRN for seizures -Continue vent support  Hypotension-unclear etiology, possibly sepsis versus medication effect versus hypovolemia -Continue stress dose steroids for now -Continue broad-spectrum antibiotics, de-escalating Zosyn to ceftriaxone -Continue to follow cultures -Bolus 1 L IV fluid  Hypernatremia with hyperkalemia, azotemia due to dehydration, slowly improving - Increase free water flushes to 200 cc every 4 hours -Recheck BMP tomorrow  AKI, likely ATN from prolonged prerenal state.  Hypovolemic at presentation. -Measure I/O and record -Avoid nephrotoxic meds -Continue to monitor -bolus 1 L LR  Uncontrolled hyperglycemia - Adding Lantus 8 units daily -Continue Accu-Cheks every 4 hours and sliding scale insulin PRN  Hypokalemia -Repleted  Left hand ischemia-acute and worsening -If husband wishes to continue to pursue aggressive care we will obtain a stat ultrasound and likely start anticoagulation.  Due to the husband's previous wish for limiting aggressive care measures, we will defer until we discussed with him today.  We will have a goals of care discussion with the husband when he arrives today.   Labs   CBC: Recent Labs  Lab 06/25/2019 1640 06/23/19 0212 06/23/19 0518 06/23/19 1241 06/24/19 0410 06/24/19 0532  WBC  18.2* 17.3* 17.6*  --  18.9*  --   NEUTROABS 14.6*  --   --   --   --   --   HGB 19.9* 19.4* 20.1* 16.3* 14.3 13.3  HCT 61.4* 60.4* 65.8* 48.0* 47.0* 39.0  MCV 88.6 88.8 91.3  --  92.0  --   PLT 294 327 294  --  208  --     Basic Metabolic Panel: Recent Labs  Lab 07/02/2019 2223 06/23/19 0212 06/23/19 0518 06/23/19 1140 06/23/19 1241 06/23/19 1712 06/23/19 2222 06/24/19 0410 06/24/19 0532  NA 157* 156* 158* 159* 159* 156* 158* 159* 157*  K 3.9 4.3 5.1 3.8 4.1 3.7 3.4* 3.5 3.4*  CL 122* 120* 120* 125*  --  127* >130* >130*  --   CO2 21* 20* 17* 19*  --  19* 18* 19*  --   GLUCOSE 211* 220* 179* 267*  --  320* 250* 213*  --   BUN 123* 125* 137* 134*  --  120* 118* 116*  --   CREATININE 2.17* 2.55* 2.60* 2.63*  --  2.24* 2.11* 2.29*  --   CALCIUM 9.6 9.6 9.5 8.4*  --  7.4* 7.3* 8.1*  --   MG  --  3.0* 3.0*  --   --   --   --  2.4  --  PHOS 4.9* 5.3* 5.1*  --   --   --   --  4.8*  --    GFR: Estimated Creatinine Clearance: 23.7 mL/min (A) (by C-G formula based on SCr of 2.29 mg/dL (H)). Recent Labs  Lab 06/23/2019 1610 06/21/2019 1640 07/10/2019 2223 06/23/19 0212 06/23/19 0518 06/23/19 1140 06/24/19 0410  PROCALCITON  --   --   --   --   --  0.16 0.20  WBC  --  18.2*  --  17.3* 17.6*  --  18.9*  LATICACIDVEN 2.6*  --  1.7 1.9  --   --   --     Liver Function Tests: Recent Labs  Lab 07/06/2019 1640 06/23/19 0212 06/23/19 0518  AST 22 20 23   ALT 27 25 25   ALKPHOS 81 82 94  BILITOT 1.1 1.0 0.7  PROT 7.7 7.8 7.3  ALBUMIN 3.7 3.7 3.4*   No results for input(s): LIPASE, AMYLASE in the last 168 hours. No results for input(s): AMMONIA in the last 168 hours.  ABG    Component Value Date/Time   PHART 7.421 06/24/2019 0532   PCO2ART 23.7 (L) 06/24/2019 0532   PO2ART 67.0 (L) 06/24/2019 0532   HCO3 15.5 (L) 06/24/2019 0532   TCO2 16 (L) 06/24/2019 0532   ACIDBASEDEF 7.0 (H) 06/24/2019 0532   O2SAT 94.0 06/24/2019 0532     Coagulation Profile: No results for  input(s): INR, PROTIME in the last 168 hours.  Cardiac Enzymes: Recent Labs  Lab 07/08/2019 1640 06/23/19 0212  CKTOTAL 192 131    HbA1C: Hgb A1c MFr Bld  Date/Time Value Ref Range Status  06/23/2019 09:45 PM 5.7 (H) 4.8 - 5.6 % Final    Comment:    (NOTE) Pre diabetes:          5.7%-6.4% Diabetes:              >6.4% Glycemic control for   <7.0% adults with diabetes   07/14/2018 06:58 AM 5.9 (H) 4.8 - 5.6 % Final    Comment:    (NOTE) Pre diabetes:          5.7%-6.4% Diabetes:              >6.4% Glycemic control for   <7.0% adults with diabetes     CBG: Recent Labs  Lab 06/23/19 1541 06/23/19 1956 06/23/19 2350 06/24/19 0411 06/24/19 0758  GLUCAP 293* 296* 96 206* 218*    This patient is critically ill with multiple organ system failure which requires frequent high complexity decision making, assessment, support, evaluation, and titration of therapies. This was completed through the application of advanced monitoring technologies and extensive interpretation of multiple databases. During this encounter critical care time was devoted to patient care services described in this note for 45 minutes.  Julian Hy, DO 06/24/19 9:27 AM  Pulmonary & Critical Care

## 2019-06-24 NOTE — Progress Notes (Addendum)
I spoke with Kelsey Wade today regarding his wife's care.  They have apparently had previous discussions about goals of care limitations, and he describes that his wife would not want these aggressive measures.  He had not told the emergency department that she was DNR, and he sounds regretful that he had not relayed that information.  He reports that his wife had not been wanting to participate in taking care of herself recently.  She requires significant assistance with ADLs at home due to her deficits from her previous stroke.  He has to assist with bathing and dressing her, and she had refused baths for several days prior to admission.  He is concerned that she would not want to participate in the required rehabilitation to recover from this episode.  He would like to proceed with palliative extubation, continuing antibiotics and antiepileptics, and letting nature take its course.  He will first go pick her mother up from [place] to come visit, and they will let us know.  He does not wish to escalate her care in any way or continue lab draws.  No vasopressors, DNR.  Julian Hy, DO 06/24/19 11:18 AM [facility]   Kelsey Wade' mother is at bedside with her son-in-law, and I have updated her on her daughter's condition and the plan to proceed with comfort- based measures.  She has not seen her daughter in quite some time, up to 3 years.  Several times she asked about the availability of an autopsy and the cost associated with it.  I have indicated that we are happy to order this, but the cost is likely unpredictable.  I have also requested that she discuss this with her son-in-law, who is likely to receive the bill.  My condolences were offered.  She is going to spend time with her daughter today, but does not want to see her once she is extubated.  They will let her nurse know when they are ready for her to be palliatively extubated.  Julian Hy, DO 06/24/19 2:26 PM [place]  Pulmonary & Critical Care

## 2019-06-24 NOTE — Progress Notes (Signed)
LTM EEG discontinued - no skin breakdown at unhook.   

## 2019-06-24 NOTE — Procedures (Signed)
Patient Name:Kelsey Wade [id number] Epilepsy Attending:Karson Chicas Barbra Sarks Referring Physician/Provider:Dr Remi Haggard A[place]or Duration: 06/24/2019 0041 to 06/24/2019 1635  Patient history:57yo F with h/o L MCA stroke and seizure who presented with ams. EEG to assess for status.  Level of alertness:lethargic  AEDs during EEG study:LEV,phenyoin   Technical aspects: This EEG study was done with scalp electrodes positioned according to the 10-20 International system of electrode placement. Electrical activity was acquired at a sampling rate of 500Hz  and reviewed with a high frequency filter of 70Hz  and a low frequency filter of 1Hz . EEG data were recorded continuously and digitally stored.  DESCRIPTION: EEG showed continuous generalized 2-5hz  theta-delta slowing, maximal left frontotemporal region. There were also sharply contoured waves with overriding fast activity at 0.5Hz  in right parieto-occipital region as well as independent spikes in left frontotemporal region, maximal T3>F7>T5. Hyperventilation and photic stimulation were not performed.  ABNORMALITY: - PLEDs, right parieto-occipital - Spikes, left frontotemporal, maximal T3 - Continuous slow, generalized, maximal left frontotemporal  IMPRESSION: This studyshowed evidence of epileptogenicity in right parieto-occipital region secondary to acute/subacute underlying etiology. There is also evidence of independent epileptogenicity and cortical dysfunction in left frontotemporal region likely secondary to underlying infarct.Additionally, there was severe diffuse encephalopathy. No definite seizures were seen.  EEG has significantly improved compared to previous study.      Augusto Deckman Barbra Sarks

## 2019-06-24 NOTE — Progress Notes (Signed)
SLP Cancellation Note  Patient Details Name: Kelsey Wade MRN: [id number] DOB: 1961-05-12   Cancelled treatment:       Reason Eval/Treat Not Completed: Medical issues which prohibited therapy. Pt has been intubated; [place] meeting pending. Will continue to follow.   Venita Sheffield Myrka Sylva 06/24/2019, 7:07 AM  Pollyann Glen, M.A. [place] Acute Environmental education officer [phone] Office [phone]

## 2019-06-24 NOTE — Procedures (Signed)
Extubation Procedure Note  Patient Details:   Name: Kelsey Wade DOB: [birth year] MRN: [id number]   Airway Documentation:  Airway 7.5 mm (Active)  Secured at (cm) 23 cm 06/23/19 1548  Measured From Lips 06/23/19 [place] 06/23/19 1548  Secured By [employer] 06/23/19 1548  Tube Holder Repositioned Yes 06/23/19 1548  Cuff Pressure (cm H2O) 26 cm H2O 06/23/19 1548  Site Condition Dry 06/23/19 1548   Vent end date: 06/24/19 Vent end time: [id number]   Evaluation  O2 sats: terminal extubation, monitor turned to comfort care Complications: Complications of terminal extubation Patient did tolerate procedure well. Bilateral Breath Sounds: Clear, Diminished   No   Patient extubated per patient/family wishes. Husband is in room with patient. RT will continue to monitor.   Terrick Allred Clyda Greener 06/24/2019, 5:29 PM

## 2019-06-24 NOTE — Progress Notes (Addendum)
Pharmacy Antibiotic Note  Kelsey Wade is a 58 y.o. female admitted on 07/20/2019 with sepsis, concern for meningitis  Plan: Adjust vanc to 750 q24h Add zosyn 3.375 gm iv q8 Monitor renal fx cx vanc lvls prn  Height: 5\' 4"  (162.6 cm) Weight: 143 lb 8.3 oz (65.1 kg) IBW/kg (Calculated) : 54.7  Temp (24hrs), Avg:97.5 F (36.4 C), Min:97.1 F (36.2 C), Max:97.6 F (36.4 C)  Recent Labs  Lab 07/18/2019 1610  07/16/2019 1640 07/02/2019 2223 06/23/19 0212 06/23/19 0518 06/23/19 1140 06/23/19 1712 06/23/19 2222 06/24/19 0410  WBC  --   --  18.2*  --  17.3* 17.6*  --   --   --  18.9*  CREATININE  --    < > 1.80* 2.17* 2.55* 2.60* 2.63* 2.24* 2.11* 2.29*  LATICACIDVEN 2.6*  --   --  1.7 1.9  --   --   --   --   --    < > = values in this interval not displayed.    Estimated Creatinine Clearance: 23.7 mL/min (A) (by C-G formula based on SCr of 2.29 mg/dL (H)).    No Known Allergies  Levester Fresh, PharmD, BCPS, BCCCP Clinical Pharmacist [phone]  Please check AMION for all [place] numbers  06/24/2019 9:08 AM

## 2019-06-24 NOTE — Progress Notes (Signed)
Subjective: No significant changes, no seizures since yesterday morning.   Exam: Vitals:   06/24/19 0800 06/24/19 0900  BP: 103/85 114/82  Pulse: 80 77  Resp: 18 18  Temp:    SpO2: 100% 100%   Gen: In bed, NAD Resp: non-labored breathing, no acute distress Abd: soft, nt  Neuro: MS: Does not open eyes or follow commands CN: Pupils are equal round reactive, she definitely has doll's eye response when turning head to the right, I suspect she does in the left as well but does less clear and difficult to test with ET tube, corneals intact bilaterally Motor: She has a chronic right spastic hemiparesis, but she has some withdrawal of noxious stimulation in the right arm and leg, minimal flexion of the left arm Sensory: As above  Labs: Phenytoin level-20 Albumin 3.4  Impression: 58 year old female who presented after being found down for 3 days, without food or water, but conscious.  She had an abrupt decline after arrival to the emergency department with frequent subclinical electrographic seizures the course of the night.  I suspect she was likely having seizures prior to arrival as well.  Apparently she has a relatively poor baseline and she was made no escalation of care last night.  Likely, she has not had a seizure since yesterday morning.  At this point, I think that prognostic decisions should only be based on her status antecedent to the most recent decompensation.  I think is still too early to decide especially in the setting of metabolic abnormalities whether or not she has had any injury from her most recent seizure flurry.  Recommendations: 1) continue LTM EEG overnight 2) continue phenytoin, Keppra 3) correction of electrolyte abnormalities per critical care medicine including severe hyponatremia 4) neurology will continue to follow  Roland Rack, MD [facility] [phone]  If 7pm- 7am, please page neurology on call as listed in [place].

## 2019-06-24 NOTE — Progress Notes (Signed)
LTM maint complete - no skin breakdown under:  Fp2, f8, p4, a2

## 2019-06-24 NOTE — TOC Initial Note (Signed)
Transition of Care [facility]) - Initial/Assessment Note    Patient Details  Name: Kelsey Wade MRN: [id number] Date of Birth: 1961/09/18  Transition of Care [facility]) CM/SW Contact:    Carles Collet, RN Phone Number: 06/24/2019, 11:31 AM  Clinical Narrative:             Per chart review, patient DNR, may have palliative/ terminal extubation later today.       Expected Discharge Plan: (anticipate hospital death)     Patient Goals and CMS Choice        Expected Discharge Plan and Services Expected Discharge Plan: (anticipate hospital death)                                              Prior Living Arrangements/Services                       Activities of Daily Living      Permission Sought/Granted                  Emotional Assessment              Admission diagnosis:  Dehydration [E86.0] Constipation [K59.00] Acute respiratory failure with hypoxemia ([place]) [J96.01] Patient Active Problem List   Diagnosis Date Noted  . Pressure injury of skin 06/24/2019  . Acute respiratory failure with hypoxemia ([place]) 06/23/2019  . Status epilepticus ([place])   . Shock circulatory ([place])   . Hypernatremia 06/28/2019  . PRES (posterior reversible encephalopathy syndrome) 01/07/2019  . Lactic acidosis 01/03/2019  . Elevated troponin 01/03/2019  . Acute hypoxemic respiratory failure ([place]) 01/03/2019  . Tachycardia 01/03/2019  . Stroke (cerebrum) ([place]) 07/13/2018  . AKI (acute kidney injury) ([place]) 01/04/2018  . Toxic metabolic encephalopathy [id number]  . Alcohol withdrawal ([place]) 01/04/2018  . Acute encephalopathy 01/01/2018  . Spastic hemiplegia affecting dominant side ([place]) 10/25/2017  . Left carotid artery occlusion   . CVA (cerebrovascular accident) ([place]) 03/02/2017  . Chronic pain 03/02/2017  . ETOH abuse 03/02/2017  . Ischemic stroke ([place])   . Hyperlipidemia LDL goal <100 11/15/2016  . HTN (hypertension) 11/12/2016  . Neuropathy 11/12/2016  .  Tobacco dependence 11/12/2016   PCP:  Elenore Paddy, FNP Pharmacy:   [id number] - [place], [place] [id number] [place] [id number] Phone: [phone] Fax: [phone]     Social Determinants of Health (SDOH) Interventions    Readmission Risk Interventions No flowsheet data found.

## 2019-06-24 NOTE — Progress Notes (Signed)
Physical Therapy Discharge Patient Details Name: Loraina Llanos MRN: [id number] DOB: May 29, 1961 Today's Date: 06/24/2019 Time:  -     Physical Therapy evaluation deferred secondary to medical decline - will need to re-order PT to resume therapy services.       Barry Brunner, PT      GP     Rexanne Mano 06/24/2019, 8:33 AM

## 2019-06-25 LAB — URINE CULTURE: Culture: NO GROWTH

## 2019-06-25 MED ORDER — ATORVASTATIN CALCIUM 40 MG PO TABS: 40.0000 mg | ORAL_TABLET | Freq: Every day | ORAL | Status: DC

## 2019-06-25 MED ORDER — ASPIRIN EC 325 MG PO TBEC: 325.0000 mg | DELAYED_RELEASE_TABLET | Freq: Every day | ORAL | Status: DC

## 2019-06-25 MED ORDER — CLOPIDOGREL BISULFATE 75 MG PO TABS: 75.0000 mg | ORAL_TABLET | Freq: Every day | ORAL | Status: DC

## 2019-06-25 MED ORDER — FREE WATER: 200.0000 mL | Freq: Four times a day (QID) | Status: DC

## 2019-06-25 MED ORDER — DEXTROSE 5 % IV SOLN: INTRAVENOUS | Status: DC

## 2019-06-25 MED ORDER — VENLAFAXINE HCL ER 75 MG PO CP24: 150.0000 mg | ORAL_CAPSULE | Freq: Every day | ORAL | Status: DC

## 2019-06-25 MED ORDER — ASPIRIN EC 81 MG PO TBEC: 81.0000 mg | DELAYED_RELEASE_TABLET | Freq: Every day | ORAL | Status: DC

## 2019-06-25 MED ORDER — ACETAMINOPHEN 650 MG RE SUPP: 650.0000 mg | Freq: Four times a day (QID) | RECTAL | Status: DC | PRN

## 2019-06-25 MED ORDER — LORAZEPAM 2 MG/ML IJ SOLN
1.0000 mg | INTRAMUSCULAR | Status: DC | PRN
  Administered 2019-06-27: 1 mg via INTRAVENOUS
  Filled 2019-06-25: qty 1

## 2019-06-25 MED ORDER — AMLODIPINE BESYLATE 10 MG PO TABS: 10.0000 mg | ORAL_TABLET | Freq: Every day | ORAL | Status: DC

## 2019-06-25 MED ORDER — ACETAMINOPHEN 325 MG PO TABS: 650.0000 mg | ORAL_TABLET | Freq: Four times a day (QID) | ORAL | Status: DC | PRN

## 2019-06-25 MED ORDER — FENTANYL CITRATE (PF) 100 MCG/2ML IJ SOLN
50.0000 ug | INTRAMUSCULAR | Status: DC | PRN
  Administered 2019-06-26: 50 ug via INTRAVENOUS
  Filled 2019-06-25: qty 2

## 2019-06-25 NOTE — Progress Notes (Signed)
Nutrition Brief Note  Chart reviewed. Pt now comfort measures only.   No further nutrition interventions warranted at this time.  Please re-consult as needed.   [employer] MS, RDN, LDN, CNSC [phone] Pager  [phone] Weekend/On-Call Pager

## 2019-06-25 NOTE — Progress Notes (Signed)
NAME:  Kelsey Wade, MRN:  [id number], DOB:  March 14, 1961, LOS: 3 ADMISSION DATE:  07/07/2019, CONSULTATION DATE:  06/23/19 REFERRING MD:  Roel Cluck - [facility] CHIEF COMPLAINT:  AMS, seizure  Brief History   58 yo female smoker fell at home and brought to ER.  Has hx of ETOH, CVA, seizures, PRES.  Found to have hypernatremia, and had seizure leading to cardiac arrest in ER with 1 minute for ROSC.  Required intubation.  Past Medical History  ETOH, CVA, PRES, Seizures, HTN, HLD,GERD, Anxiety, Depression  Significant Hospital Events   10/02 Admit 10/03 Seizure, VDRF 10/04 One way extubation, DNR, comfort care  Consults:  Neurology  Procedures:  ETT 10/03 >> 10/04 Lt chest tube 10/03 >> Lt [place] CVL 1/003 >>   Significant Diagnostic Tests:  EEG 10/3 >> no seizure activity CT H 10/2 >> cerebral edema R>L with interval improvement from prior exam, chronic encephalomalacia in L fronto-parietal region, chronic R ICA hyperdensity   Micro Data:  10/2 SARS Cov2> neg  Antimicrobials:  Pip-tazo  10/3 vanc 10/3  Interim history/subjective:  Extubated 10/04  Objective   Blood pressure 105/66, pulse (!) 42, temperature (!) 97.5 F (36.4 C), temperature source Oral, resp. rate (!) 24, height 5\' 4"  (1.626 m), weight 65.1 kg, SpO2 100 %. Vent Mode: PRVC FiO2 (%):  [30 %] 30 % Set Rate:  [18 bmp] 18 bmp Vt Set:  [430 mL] 430 mL PEEP:  [5 cmH20] 5 cmH20 Plateau Pressure:  [13 cmH20] 13 cmH20   Intake/Output Summary (Last 24 hours) at 06/25/2019 [id number] Last data filed at 06/25/2019 0600 Gross per 24 hour  Intake 2005.89 ml  Output 915 ml  Net 1090.89 ml   Filed Weights   07/03/2019 1631  Weight: 65.1 kg   Examination:  General - somnolent Eyes - pupils dilated ENT - no stridor Cardiac - regular, bradycardic Chest - decreased breath sounds Abdomen - decreased bowel sounds Extremities - decreased muscle bulk Skin - no rashes Neuro - not following commands  Resolved Hospital Problem  list   Acute hypoxic respiratory failure in setting of seizure, hypotension from hypovolemia  Assessment & Plan:   Acute metabolic encephalopathy in setting of hypernatremia, seizure. Hx of CVA, PRES, ETOH, depression. - continue AEDs per family request  Hypernatremia. - defer further lab tests  Iatrogenic left pneumothorax. - change to water seal 10/05 and leave chest tube for now  AKI from hypovolemia. - baseline creatinine 0.61 from 01/08/19  Goals of care. - DNR/DNI, comfort measures - prn ativan, fentanyl, tylenol, robinul  Disposition. - transfer to floor bed   Labs:   CMP Latest Ref Rng & Units 06/24/2019 06/24/2019 06/23/2019  Glucose 70 - 99 mg/dL - 213(H) 250(H)  BUN 6 - 20 mg/dL - 116(H) 118(H)  Creatinine 0.44 - 1.00 mg/dL - 2.29(H) 2.11(H)  Sodium 135 - 145 mmol/L 157(H) 159(H) 158(H)  Potassium 3.5 - 5.1 mmol/L 3.4(L) 3.5 3.4(L)  Chloride 98 - 111 mmol/L - >130(HH) >130(HH)  CO2 22 - 32 mmol/L - 19(L) 18(L)  Calcium 8.9 - 10.3 mg/dL - 8.1(L) 7.3(L)  Total Protein 6.5 - 8.1 g/dL - - -  Total Bilirubin 0.3 - 1.2 mg/dL - - -  Alkaline Phos 38 - 126 U/L - - -  AST 15 - 41 U/L - - -  ALT 0 - 44 U/L - - -    CBC Latest Ref Rng & Units 06/24/2019 06/24/2019 06/23/2019  WBC 4.0 - 10.5 K/uL - 18.9(H) -  Hemoglobin 12.0 - 15.0 g/dL 13.3 14.3 16.3(H)  Hematocrit 36.0 - 46.0 % 39.0 47.0(H) 48.0(H)  Platelets 150 - 400 K/uL - 208 -    ABG    Component Value Date/Time   PHART 7.421 06/24/2019 0532   PCO2ART 23.7 (L) 06/24/2019 0532   PO2ART 67.0 (L) 06/24/2019 0532   HCO3 15.5 (L) 06/24/2019 0532   TCO2 16 (L) 06/24/2019 0532   ACIDBASEDEF 7.0 (H) 06/24/2019 0532   O2SAT 94.0 06/24/2019 0532    CBG (last 3)  Recent Labs    06/23/19 2350 06/24/19 0411 06/24/19 [place], MD [facility] 06/25/2019, 9:05 AM

## 2019-06-25 NOTE — Progress Notes (Signed)
Dahlia Bailiff, RN and Antonietta Breach, RN wasted 28mL of fentanyl in the sink.

## 2019-06-26 DIAGNOSIS — Z515 Encounter for palliative care: Secondary | ICD-10-CM

## 2019-06-26 DIAGNOSIS — J95811 Postprocedural pneumothorax: Secondary | ICD-10-CM

## 2019-06-26 LAB — T3, FREE: T3, Free: 1.8 pg/mL — ABNORMAL LOW (ref 2.0–4.4)

## 2019-06-26 LAB — VITAMIN B1: Vitamin B1 (Thiamine): 88.2 nmol/L (ref 66.5–200.0)

## 2019-06-26 MED ORDER — FENTANYL CITRATE (PF) 100 MCG/2ML IJ SOLN: 50.0000 ug | INTRAMUSCULAR | Status: DC | PRN

## 2019-06-26 NOTE — Progress Notes (Signed)
   NAME:  Kelsey Wade, MRN:  [id number], DOB:  06-20-1961, LOS: 4 ADMISSION DATE:  07/17/2019, CONSULTATION DATE:  06/23/19 REFERRING MD:  Roel Cluck - [facility] CHIEF COMPLAINT:  AMS, seizure  Brief History   58 yo female smoker fell at home and brought to ER.  Has hx of ETOH, CVA, seizures, PRES.  Found to have hypernatremia, and had seizure leading to cardiac arrest in ER with 1 minute for ROSC.  Required intubation.  Past Medical History  ETOH, CVA, PRES, Seizures, HTN, HLD,GERD, Anxiety, Depression  Significant Hospital Events   10/02 Admit 10/03 Seizure, VDRF 10/04 One way extubation, DNR, comfort care  Consults:  Neurology  Procedures:  ETT 10/03 >> 10/04 Lt chest tube 10/03 >> Lt [place] CVL 1/003 >>   Significant Diagnostic Tests:  EEG 10/3 >> no seizure activity CT H 10/2 >> cerebral edema R>L with interval improvement from prior exam, chronic encephalomalacia in L fronto-parietal region, chronic R ICA hyperdensity   Micro Data:  10/2 SARS Cov2> neg  Antimicrobials:  Pip-tazo  10/3 vanc 10/3  Interim history/subjective:   RR a little labored.  Objective   Blood pressure 127/85, pulse (Abnormal) 55, temperature (Abnormal) 97.5 F (36.4 C), temperature source Oral, resp. rate (Abnormal) 21, height 5\' 4"  (1.626 m), weight 65.1 kg, SpO2 94 %.     Intake/Output Summary (Last 24 hours) at 06/26/2019 1003 Last data filed at 06/26/2019 0617 Gross per 24 hour  Intake 100 ml  Output 450 ml  Net -350 ml   Filed Weights   07/21/2019 1631  Weight: 65.1 kg   Examination:  General frail 58 year old white female who appears much older than stated age Neuro unresponsive pulm labored tachypnic RR. Left CT w/ 1/7 airleak. Dressing is intact.  Card RRR abd soft Ext warm  Resolved Hospital Problem list   Acute hypoxic respiratory failure in setting of seizure, hypotension from hypovolemia  Assessment & Plan:   Acute metabolic encephalopathy in setting of hypernatremia,  seizure. Hx of CVA, PRES, ETOH, depression. Hypernatremia. Iatrogenic left pneumothorax.- changed to water seal 10/05  AKI from hypovolemia  Discussion Still has 1/7 airleak which is fairly consistent. RR a little labored. Not responsive otherwise.   Plan Cont AEDs PRN fent,ativan, apap and robinul  Place CT back to 20cm sxn. (this could contribute to discomfort w/ on-going PTX) rx pain as needed.   Erick Colace ACNP-BC [place] Pager # [phone] OR # [phone] if no answer

## 2019-06-26 NOTE — TOC Initial Note (Signed)
Transition of Care [facility]) - Initial/Assessment Note    Patient Details  Name: Kelsey Wade MRN: [id number] Date of Birth: Apr 06, 1961  Transition of Care [facility]) CM/SW Contact:    Alexander Mt, [place] Phone Number: 06/26/2019, 1:44 PM  Clinical Narrative:                 CSW alerted by PMT that pt husband has requested [facility]. Referral given to Venia Carbon with [facility]; let Anderson Malta know extension to pt room due to issues with pt husband's phone.   CSW aware there are currently no beds, will follow for availability. Pt will need DNR signed for discharge.   Expected Discharge Plan: [place] Barriers to Discharge: Hospice Bed not available   Patient Goals and CMS Choice Patient states their goals for this hospitalization and ongoing recovery are:: comfort at [facility] Medicare.gov Compare Post Acute Care list provided to:: Patient Represenative (must comment)(pt spouse) Choice offered to / list presented to : Spouse  Expected Discharge Plan and Services Expected Discharge Plan: [place] In-house Referral: Clinical Social Work, Hospice / [place] Acute Care Choice: Hospice Living arrangements for the past 2 months: Single Family Home  Prior Living Arrangements/Services Living arrangements for the past 2 months: Single Family Home Lives with:: Spouse Patient language and need for interpreter reviewed:: Yes(no needs) Do you feel safe going back to the place where you live?: No   hospice placement  Need for Family Participation in Patient Care: Yes (Comment)(decision making) Care giver support system in place?: Yes (comment)(spouse; family)   Criminal Activity/Legal Involvement Pertinent to Current Situation/Hospitalization: No - Comment as needed  Activities of Daily Living      Permission Sought/Granted Permission sought to share information with : Facility Sport and exercise psychologist, Family Supports Permission  granted to share information with : No(pt is EOL)  Share Information with NAME: Claria Dice  Permission granted to share info w AGENCY: [place] granted to share info w Relationship: husband  Permission granted to share info w Contact Information: [phone]  Emotional Assessment Appearance:: Other (Comment Required(pt at EOL) Attitude/Demeanor/Rapport: (n/a) Affect (typically observed): (n/a) Orientation: : (pt is EOL) Alcohol / Substance Use: Not Applicable Psych Involvement: No (comment)  Admission diagnosis:  Dehydration [E86.0] Constipation [K59.00] Acute respiratory failure with hypoxemia ([place]) [J96.01] Patient Active Problem List   Diagnosis Date Noted  . Comfort measures only status   . Palliative care by specialist   . Pressure injury of skin 06/24/2019  . Acute respiratory failure with hypoxemia ([place]) 06/23/2019  . Status epilepticus ([place])   . Shock circulatory ([place])   . Hypernatremia 07/15/2019  . PRES (posterior reversible encephalopathy syndrome) 01/07/2019  . Lactic acidosis 01/03/2019  . Elevated troponin 01/03/2019  . Acute hypoxemic respiratory failure ([place]) 01/03/2019  . Tachycardia 01/03/2019  . Stroke (cerebrum) ([place]) 07/13/2018  . AKI (acute kidney injury) ([place]) 01/04/2018  . Toxic metabolic encephalopathy [id number]  . Alcohol withdrawal ([place]) 01/04/2018  . Acute encephalopathy 01/01/2018  . Spastic hemiplegia affecting dominant side ([place]) 10/25/2017  . Left carotid artery occlusion   . CVA (cerebrovascular accident) ([place]) 03/02/2017  . Chronic pain 03/02/2017  . ETOH abuse 03/02/2017  . Ischemic stroke ([place])   . Hyperlipidemia LDL goal <100 11/15/2016  . HTN (hypertension) 11/12/2016  . Neuropathy 11/12/2016  . Tobacco dependence 11/12/2016   PCP:  Elenore Paddy, FNP Pharmacy:   [id number] - , [place]  [id number] [place]  [place] [address] Phone: [phone] Fax: [phone]     Social Determinants of Health (SDOH) Interventions    Readmission Risk Interventions No flowsheet data found.

## 2019-06-26 NOTE — Progress Notes (Signed)
Manufacturing engineer [facility])  Referral received for residential hospice at [facility] from [place].    [facility] does not have any beds to offer today.  NOK is spouse, attempted to call several times into the room to see if any questions need answered and no answer (phone currently cut off due to finances).  [facility] will update hospital staff and family with changes in bed availability.  Venia Carbon RN, BSN, [place] [facility] Liaison (in [place]) [phone]

## 2019-06-26 NOTE — Progress Notes (Signed)
PROGRESS NOTE    Kelsey Wade  [id number] DOB: [birth year] DOA: 06/30/2019 PCP: Elenore Paddy, FNP   Brief Narrative:  58 year old with history of CVA, chronic back pain, depression, alcohol, GERD, HTN, seizure disorder, PRES, who presented with fall, and was unable to get up for approximately 3 days.  Patient also had poor oral intake.  It seems that she was too heavy her husband to pick up.   Noted to have an episode of seizure like activity in the ED on admission and was unresponsive, hypernatremia, respiratory failure, iatrogenic pneumothorax, hypotension, AKI, left hand ischemia.  Supposedly patient was in cardiac arrest in the ED with 1 minute for ROSC.  Patient was initially admitted to hospital service however given her acute encephalopathy and need for airway protection, she was intubated, and was seen by PCCM.  Goals of care conversation was had with the husband, it was decided to patient come comfort measures.  Of note patient does still have chest tube for the pneumothorax.  Have consulted palliative care, ? hospice referral.   Assessment & Plan   As above, patient was admitted for acute encephalopathy and for possible seizure activity.  She was intubated and then extubated as she was transition to comfort care.  Palliative care consulted and appreciated.  Patient currently DNR.  Possible hospice referral pending.  Acute hypoxic respiratory failure secondary to acute encephalopathy -Thought to be postictal confusion -Patient was initially intubated and then extubated, transition to comfort care  Iatrogenic pneumothorax -PCCM still following, chest tube set to suction  Seizure disorder -Noted to have AED noncompliance -Currently on IV Keppra -Neurology consulted and appreciated -EEG was done, no seizure activity noted -24-hour/continuous EEG was planned however this was discontinued as patient was transitioned to comfort care  Hypotension -Possibly secondary to  hypovolemia versus medications -She was given stress dose steroids -She was placed on antibiotics for questionable sepsis etiology -Blood culture showed no growth to date -Urine culture shows no growth  Hyponatremia, hyperkalemia, azotemia -Secondary to dehydration -Was given free water flushes  AKI -Likely ATN from prolonged prerenal state and hypovolemia  Uncontrolled hyperglycemia  Left hand ischemia  History of CVA  DVT Prophylaxis  None, comfort care  Code Status: DNR  Family Communication: None at bedside  Disposition Plan: Admitted. Pending possible hospice referral.   Consultants PCCM Neurology Palliative Care  Procedures  Intubation/extubation EEG Chest tube placement  Antibiotics   Anti-infectives (From admission, onward)   Start     Dose/Rate Route Frequency Ordered Stop   06/25/19 1200  vancomycin (VANCOCIN) 1,250 mg in sodium chloride 0.9 % 250 mL IVPB  Status:  Discontinued     1,250 mg 166.7 mL/hr over 90 Minutes Intravenous Every 48 hours 06/24/19 0907 06/24/19 0920   06/24/19 1000  cefTRIAXone (ROCEPHIN) 2 g in sodium chloride 0.9 % 100 mL IVPB  Status:  Discontinued     2 g 200 mL/hr over 30 Minutes Intravenous Every 12 hours 06/24/19 0914 06/24/19 1948   06/24/19 0930  vancomycin (VANCOCIN) IVPB 750 mg/150 ml premix  Status:  Discontinued     750 mg 150 mL/hr over 60 Minutes Intravenous Every 24 hours 06/24/19 0920 06/25/19 0926   06/24/19 0915  cefTRIAXone (ROCEPHIN) 1 g in sodium chloride 0.9 % 100 mL IVPB  Status:  Discontinued     1 g 200 mL/hr over 30 Minutes Intravenous Every 24 hours 06/24/19 0908 06/24/19 0914   06/23/19 1200  piperacillin-tazobactam (ZOSYN) IVPB 3.375 g  Status:  Discontinued     3.375 g 12.5 mL/hr over 240 Minutes Intravenous Every 8 hours 06/23/19 1145 06/24/19 0908   06/23/19 1200  vancomycin (VANCOCIN) IVPB 1000 mg/200 mL premix  Status:  Discontinued     1,000 mg 200 mL/hr over 60 Minutes Intravenous Every 48  hours 06/23/19 1145 06/24/19 [id number]      Subjective:   Kelsey Wade seen and examined today.  Unresponsive.    Objective:   Vitals:   06/25/19 1700 06/25/19 1800 06/25/19 1900 06/26/19 0617  BP:    127/85  Pulse: (!) 33 (!) 46 (!) 35 (!) 55  Resp: (!) 29 (!) 32 (!) 31 (!) 21  Temp:      TempSrc:      SpO2: (!) 82% 97% (!) 89% 94%  Weight:      Height:        Intake/Output Summary (Last 24 hours) at 06/26/2019 1052 Last data filed at 06/26/2019 0900 Gross per 24 hour  Intake 100 ml  Output 450 ml  Net -350 ml   Filed Weights   07/07/2019 1631  Weight: 65.1 kg    Exam  General: Well developed, chronically ill-appearing, appears older than stated age, unresponsive  HEENT: NCAT, mucous membranes moist.   Cardiovascular: S1 S2 auscultated, RRR  Respiratory: Increased work of breathing-tachypneic, left chest tube  Abdomen: Soft, nontender, nondistended, + bowel sounds  Extremities: warm dry without cyanosis clubbing or edema   Data Reviewed: I have personally reviewed following labs and imaging studies  CBC: Recent Labs  Lab 07/16/2019 1640 06/23/19 0212 06/23/19 0518 06/23/19 1241 06/24/19 0410 06/24/19 0532  WBC 18.2* 17.3* 17.6*  --  18.9*  --   NEUTROABS 14.6*  --   --   --   --   --   HGB 19.9* 19.4* 20.1* 16.3* 14.3 13.3  HCT 61.4* 60.4* 65.8* 48.0* 47.0* 39.0  MCV 88.6 88.8 91.3  --  92.0  --   PLT 294 327 294  --  208  --    Basic Metabolic Panel: Recent Labs  Lab 07/05/2019 2223 06/23/19 0212 06/23/19 0518 06/23/19 1140 06/23/19 1241 06/23/19 1712 06/23/19 2222 06/24/19 0410 06/24/19 0532  NA 157* 156* 158* 159* 159* 156* 158* 159* 157*  K 3.9 4.3 5.1 3.8 4.1 3.7 3.4* 3.5 3.4*  CL 122* 120* 120* 125*  --  127* >130* >130*  --   CO2 21* 20* 17* 19*  --  19* 18* 19*  --   GLUCOSE 211* 220* 179* 267*  --  320* 250* 213*  --   BUN 123* 125* 137* 134*  --  120* 118* 116*  --   CREATININE 2.17* 2.55* 2.60* 2.63*  --  2.24* 2.11* 2.29*  --    CALCIUM 9.6 9.6 9.5 8.4*  --  7.4* 7.3* 8.1*  --   MG  --  3.0* 3.0*  --   --   --   --  2.4  --   PHOS 4.9* 5.3* 5.1*  --   --   --   --  4.8*  --    GFR: Estimated Creatinine Clearance: 23.7 mL/min (A) (by C-G formula based on SCr of 2.29 mg/dL (H)). Liver Function Tests: Recent Labs  Lab 07/08/2019 1640 06/23/19 0212 06/23/19 0518  AST 22 20 23   ALT 27 25 25   ALKPHOS 81 82 94  BILITOT 1.1 1.0 0.7  PROT 7.7 7.8 7.3  ALBUMIN 3.7 3.7 3.4*   No results for input(s):  LIPASE, AMYLASE in the last 168 hours. No results for input(s): AMMONIA in the last 168 hours. Coagulation Profile: No results for input(s): INR, PROTIME in the last 168 hours. Cardiac Enzymes: Recent Labs  Lab 07/11/2019 1640 06/23/19 0212  CKTOTAL 192 131   BNP (last 3 results) No results for input(s): PROBNP in the last 8760 hours. HbA1C: Recent Labs    06/23/19 2145  HGBA1C 5.7*   CBG: Recent Labs  Lab 06/23/19 1541 06/23/19 1956 06/23/19 2350 06/24/19 0411 06/24/19 0758  GLUCAP 293* 296* 96 206* 218*   Lipid Profile: No results for input(s): CHOL, HDL, LDLCALC, TRIG, CHOLHDL, LDLDIRECT in the last 72 hours. Thyroid Function Tests: Recent Labs    06/23/19 1140  FREET4 1.04  T3FREE 1.8*   Anemia Panel: No results for input(s): VITAMINB12, FOLATE, FERRITIN, TIBC, IRON, RETICCTPCT in the last 72 hours. Urine analysis:    Component Value Date/Time   COLORURINE YELLOW 06/23/2019 2100   APPEARANCEUR CLOUDY (A) 06/23/2019 2100   LABSPEC 1.020 06/23/2019 2100   PHURINE 5.0 06/23/2019 2100   GLUCOSEU 50 (A) 06/23/2019 2100   HGBUR NEGATIVE 06/23/2019 2100   [place] NEGATIVE 06/23/2019 2100   KETONESUR NEGATIVE 06/23/2019 2100   PROTEINUR NEGATIVE 06/23/2019 2100   UROBILINOGEN 0.2 08/10/2017 1059   NITRITE NEGATIVE 06/23/2019 2100   LEUKOCYTESUR NEGATIVE 06/23/2019 2100   Sepsis Labs: @LABRCNTIP (procalcitonin:4,lacticidven:4)  ) Recent Results (from the past 240 hour(s))  SARS  Coronavirus 2 [facility] order, Performed in [facility] hospital lab) Nasopharyngeal Nasopharyngeal Swab     Status: None   Collection Time: 07/06/2019  5:15 PM   Specimen: Nasopharyngeal Swab  Result Value Ref Range Status   SARS Coronavirus 2 NEGATIVE NEGATIVE Final    Comment: (NOTE) If result is NEGATIVE SARS-CoV-2 target nucleic acids are NOT DETECTED. The SARS-CoV-2 RNA is generally detectable in upper and lower  respiratory specimens during the acute phase of infection. The lowest  concentration of SARS-CoV-2 viral copies this assay can detect is 250  copies / mL. A negative result does not preclude SARS-CoV-2 infection  and should not be used as the sole basis for treatment or other  patient management decisions.  A negative result may occur with  improper specimen collection / handling, submission of specimen other  than nasopharyngeal swab, presence of viral mutation(s) within the  areas targeted by this assay, and inadequate number of viral copies  (<250 copies / mL). A negative result must be combined with clinical  observations, patient history, and epidemiological information. If result is POSITIVE SARS-CoV-2 target nucleic acids are DETECTED. The SARS-CoV-2 RNA is generally detectable in upper and lower  respiratory specimens dur ing the acute phase of infection.  Positive  results are indicative of active infection with SARS-CoV-2.  Clinical  correlation with patient history and other diagnostic information is  necessary to determine patient infection status.  Positive results do  not rule out bacterial infection or co-infection with other viruses. If result is PRESUMPTIVE POSTIVE SARS-CoV-2 nucleic acids MAY BE PRESENT.   A presumptive positive result was obtained on the submitted specimen  and confirmed on repeat testing.  While 2019 novel coronavirus  (SARS-CoV-2) nucleic acids may be present in the submitted sample  additional confirmatory testing may be necessary  for epidemiological  and / or clinical management purposes  to differentiate between  SARS-CoV-2 and other Sarbecovirus currently known to infect humans.  If clinically indicated additional testing with an alternate test  methodology [phone]) is advised. The SARS-CoV-2  RNA is generally  detectable in upper and lower respiratory sp ecimens during the acute  phase of infection. The expected result is Negative. Fact Sheet for Patients:  StrictlyIdeas.no Fact Sheet for Healthcare Providers: BankingDealers.co.za This test is not yet approved or cleared by the [place] FDA and has been authorized for detection and/or diagnosis of SARS-CoV-2 by FDA under an Emergency Use Authorization (EUA).  This EUA will remain in effect (meaning this test can be used) for the duration of the COVID-19 declaration under Section 564(b)(1) of the Act, 21 U.S.C. section 360bbb-3(b)(1), unless the authorization is terminated or revoked sooner. Performed at [facility], [place] [address] [place], [place] [address]   Culture, blood (Routine X 2) w Reflex to ID Panel     Status: None (Preliminary result)   Collection Time: 06/23/19 12:59 PM   Specimen: BLOOD LEFT HAND  Result Value Ref Range Status   Specimen Description BLOOD LEFT HAND  Final   Special Requests   Final    BOTTLES DRAWN AEROBIC ONLY Blood Culture results may not be optimal due to an inadequate volume of blood received in culture bottles   Culture   Final    NO GROWTH 2 DAYS Performed at [facility], [place] [address], [place], [place] [address]    Report Status PENDING  Incomplete  Culture, blood (Routine X 2) w Reflex to ID Panel     Status: None (Preliminary result)   Collection Time: 06/23/19  1:18 PM   Specimen: BLOOD LEFT HAND  Result Value Ref Range Status   Specimen Description BLOOD LEFT HAND  Final   Special Requests   Final    BOTTLES DRAWN AEROBIC ONLY Blood Culture  results may not be optimal due to an inadequate volume of blood received in culture bottles   Culture   Final    NO GROWTH 2 DAYS Performed at [facility], [place] [address], [place], [place] [address]    Report Status PENDING  Incomplete  Culture, Urine     Status: None   Collection Time: 06/23/19  9:00 PM   Specimen: Urine, Catheterized  Result Value Ref Range Status   Specimen Description URINE, CATHETERIZED  Final   Special Requests NONE  Final   Culture   Final    NO GROWTH Performed at [facility], [address] [place], [place] [address]    Report Status 06/25/2019 FINAL  Final      Radiology Studies: No results found.   Scheduled Meds: . phenytoin (DILANTIN) IV  100 mg Intravenous Q8H   Continuous Infusions: . levETIRAcetam 500 mg (06/26/19 1033)     LOS: 4 days   Time Spent in minutes   30 minutes  Shaneque Merkle D.O. on 06/26/2019 at 10:52 AM  Between 7am to 7pm - Please see pager noted on [email or web address]  After 7pm go to [email or web address]  And look for the night coverage person covering for me after hours  [facility] Office  [phone]

## 2019-06-26 NOTE — Consult Note (Signed)
Consultation Note Date: 06/26/2019   Patient Name: Kelsey Wade  DOB: 1961-07-02  MRN: [id number]  Age / Sex: 58 y.o., female  PCP: Elenore Paddy, [place] Referring Physician: Cristal Ford, DO  Reason for Consultation: Hospice Evaluation and Terminal Care  HPI/Patient Profile: 58 y.o. female  with past medical history of CVA, chronic back pain, depression, ETOH use, seizures, GERD, HTN, HLD, and tobacco use  admitted on 07/11/2019 with a fall. She was unable to get up for 3 days. She had seizure like activity and became unresponsive. This led to cardiac arrest with 1 minutes of ROSC and she required intubation. Family spoke with CCM and shared that patient would not want aggressive measures - patient was compassionately extubated on 10/4. PMT consulted to assist with comfort care and hospice evaluation.   Clinical Assessment and Goals of Care: I have reviewed medical records including EPIC notes, labs and imaging, and received report from RN - RN reports patient appears comfortable. No concerns.    I then spoke with patient's spouse, Laurey Arrow,  to discuss diagnosis prognosis, GOC, EOL wishes, disposition and options.  I introduced Palliative Medicine as specialized medical care for people living with serious illness. It focuses on providing relief from the symptoms and stress of a serious illness. The goal is to improve quality of life for both the patient and the family.   We discussed her current illness and what it means in the larger context of her on-going co-morbidities.  Natural disease trajectory and expectations at EOL were discussed.  Laurey Arrow continues to endorse his goal of focusing solely on the patient's comfort and avoid suffering. He has visited her this morning and will return this afternoon - he tells me he is satisfied with her care and believes she is comfortable.  We discussed her care moving forward - specifically spoke about  referral to residential Why is interested in this - shares with me that his best friend recently passed away at [facility] and he was very pleased with the care his friend received. He specifically requests placement at [facility] as this is close to his home. We discussed the type of care that would be provided at the hospice facility. Laurey Arrow agrees this is what he wants.   Questions and concerns were addressed. The family was encouraged to call with questions or concerns.   Primary Decision Maker NEXT OF KIN - spouse Pete    SUMMARY OF RECOMMENDATIONS   - continue comfort measures - per RN and spouse patient is comfortable with current measures - discussed with CSW - referral [facility]  Code Status/Advance Care Planning:  DNR   Symptom Management:   Fentanyl PRN - adjusted frequency  Robinul PRN  Ativan PRN  Additional Recommendations (Limitations, Scope, Preferences):  Full Comfort Care  Psycho-social/Spiritual:   Desire for further Chaplaincy support:no  Additional Recommendations: Education on Hospice  Prognosis:   < 2 weeks  Discharge Planning: Hospice facility [facility] requested     Primary Diagnoses: Present on Admission: . Hypernatremia . AKI (acute kidney injury) ([place]) . Chronic pain . ETOH abuse . HTN (hypertension) . Hyperlipidemia LDL goal <100 . Toxic metabolic encephalopathy . Acute respiratory failure with hypoxemia ([place])   I have reviewed the medical record, interviewed the patient and family, and examined the patient. The following aspects are pertinent.  Past Medical History:  Diagnosis Date  . Anxiety   . Arthritis   . Chronic back pain   .  Depression   . ETOH abuse   . GERD (gastroesophageal reflux disease)   . HTN (hypertension)   . Hyperlipidemia   . Stroke [facility])    June 2018  . Tobacco abuse    Social History   Socioeconomic History  . Marital status: Married    Spouse name: Not on  file  . Number of children: 2  . Years of education: Not on file  . Highest education level: 9th grade  Occupational History  . Occupation: disabled  Social Needs  . Financial resource strain: Not on file  . Food insecurity    Worry: Not on file    Inability: Not on file  . Transportation needs    Medical: Not on file    Non-medical: Not on file  Tobacco Use  . Smoking status: Current Every Day Smoker    Packs/day: 0.50    Types: Cigarettes  . Smokeless tobacco: Never Used  . Tobacco comment: less  Substance and Sexual Activity  . Alcohol use: Yes    Alcohol/week: 2.0 standard drinks    Types: 2 Cans of beer per week    Comment: occasionally   . Drug use: Yes    Types: Marijuana    Comment: quit 2 months ago 10-20-17  . Sexual activity: Not on file  Lifestyle  . Physical activity    Days per week: Not on file    Minutes per session: Not on file  . Stress: Not on file  Relationships  . Social Herbalist on phone: Not on file    Gets together: Not on file    Attends religious service: Not on file    Active member of club or organization: Not on file    Attends meetings of clubs or organizations: Not on file    Relationship status: Not on file  Other Topics Concern  . Not on file  Social History Narrative   Divorced, lives in a 1 story home, with 2 steps to enter. Drinks 2 cups of coffee, and one Mtn. Dew a day. Prior to becoming disabled by CVA, patient was employed in house keeping.    Family History  Problem Relation Age of Onset  . Alcohol abuse Father   . Alcohol abuse Brother   . Breast cancer Maternal Aunt   . Colon cancer Neg Hx   . Esophageal cancer Neg Hx   . Stomach cancer Neg Hx   . Rectal cancer Neg Hx    Scheduled Meds: . phenytoin (DILANTIN) IV  100 mg Intravenous Q8H   Continuous Infusions: . levETIRAcetam 500 mg (06/26/19 1033)   PRN Meds:.acetaminophen **OR** acetaminophen, albuterol, fentaNYL (SUBLIMAZE) injection, glycopyrrolate  **OR** glycopyrrolate **OR** glycopyrrolate, LORazepam No Known Allergies  Vital Signs: BP 127/85 (BP Location: Left Arm)   Pulse (!) 55   Temp (!) 97.5 F (36.4 C) (Oral)   Resp (!) 21   Ht 5\' 4"  (1.626 m)   Wt 65.1 kg   SpO2 94%   BMI 24.64 kg/m  Pain Scale: Faces   Pain Score: 10-Worst pain ever   SpO2: SpO2: 94 % O2 Device:SpO2: 94 % O2 Flow Rate: .   IO: Intake/output summary:   Intake/Output Summary (Last 24 hours) at 06/26/2019 1232 Last data filed at 06/26/2019 0900 Gross per 24 hour  Intake 0 ml  Output 450 ml  Net -450 ml    LBM: Last BM Date: 06/26/19 Baseline Weight: Weight: 65.1 kg Most recent weight: Weight: 65.1 kg  Palliative Assessment/Data: PPS 10%    The above conversation was completed via telephone due to the visitor restrictions during the COVID-19 pandemic. Thorough chart review and discussion with necessary members of the care team was completed as part of assessment. All issues were discussed and addressed but no physical exam was performed.  Time Total: 50 minutes Greater than 50%  of this time was spent counseling and coordinating care related to the above assessment and plan.  Juel Burrow, DNP, AGNP-C Palliative Medicine Team [phone] Pager: [phone]

## 2019-06-26 NOTE — Social Work (Signed)
CSW spoke with pt RN Mendel Ryder who states that pt had cards and personal items with her when she was picked up by EMS per husbands report. CSW contacted [facility]; they have checked the ticket and do not have any documentation of items. They have received a call from pt spouse also already requesting they look for items. If something is found they will contact spouse.  CSW will also request that RN reach out to security to see if they have the missing items. CSW also alerted PMT that pt husbands cell phone is not currently working and that he is at bedside.   Westley Hummer, MSW, [place] Work [phone]

## 2019-06-26 NOTE — Progress Notes (Signed)
Palliative:  Consult received and chart reviewed. Comfort orders are in place. Have attempted a few times this AM to speak with spouse regarding care  moving forward - residential hospice? Home number does not work, left voicemail on cell. Awaiting return call, will try again this afternoon. If anyone is able to speak with spouse please request he call palliative team at [phone].  Thank you, Juel Burrow, DNP, [facility] Palliative Medicine Team Team Phone # [phone]  Pager # [phone]  NO CHARGE

## 2019-06-27 DIAGNOSIS — J939 Pneumothorax, unspecified: Secondary | ICD-10-CM

## 2019-06-28 LAB — CULTURE, BLOOD (ROUTINE X 2)
Culture: NO GROWTH
Culture: NO GROWTH

## 2019-07-22 NOTE — TOC Progression Note (Signed)
Transition of Care [facility]) - Progression Note    Patient Details  Name: Kelsey Wade MRN: [id number] Date of Birth: 04/24/1961  Transition of Care [facility]) CM/SW [place], [place] Phone Number: 07/27/19, 10:48 AM  Clinical Narrative:    At this time there is no [facility] available bed.  [facility] liaison and CSW both following for bed availability.   Expected Discharge Plan: [place] Barriers to Discharge: Hospice Bed not available  Expected Discharge Plan and Services Expected Discharge Plan: [place] In-house Referral: Clinical Social Work, Hospice / [place] Acute Care Choice: Hospice Living arrangements for the past 2 months: Single Family Home   Social Determinants of Health (SDOH) Interventions    Readmission Risk Interventions No flowsheet data found.

## 2019-07-22 NOTE — Progress Notes (Signed)
Tried to call patient's aunt three times and was able to get hold of her after the 4th try, called to get more information and was given contact number of mother Franki Cabot). This RN was able to get hold of mother and got the funeral home information.

## 2019-07-22 NOTE — Progress Notes (Signed)
Patient expired at 55. MD notified. Attempted to call Jaynie Collins (patient's boyfriend) not spouse, not able to reach him on either phone number listed. Called Chrissie Noa (Uncle)- wrong phone number listed. Called Terance Hart (Aunt)- and informed her of Kelsey Wade passing. She states [name] is not the patient spouse and that she will contact [name].   Awaiting return call to gather funeral home information.

## 2019-07-22 NOTE — Progress Notes (Signed)
PCCM Progress Note  Name: Kelsey Wade DOB: [birth year] MRN: [id number] Date of admission: 07/12/2019 Date of consult: 06/23/2019 Referring provider: Dr. Roel Cluck, [facility] CC: Altered mental status  Brief History: 58 yo female smoker fell at home and brought to ER.  Hx of ETOH, CVA, seizures, PRES.  Found to have hypernatremia, seziure, and cardiac arrest with ROSC in 1 min.  Past Medical History: ETOH, CVA, PRES, Seizures, HTN, HLD,GERD, Anxiety, Depression  Subjective: Non verbal  Vital signs: BP 127/85 (BP Location: Left Arm)   Pulse (!) 55   Temp (!) 97.5 F (36.4 C) (Oral)   Resp (!) 21   Ht 5\' 4"  (1.626 m)   Wt 65.1 kg   SpO2 94%   BMI 24.64 kg/m   Physical exam: General - increased WOB Eyes - pupils pinpoint ENT - no stridor Cardiac - regular, bradycardic Chest - b/l crackles, Lt chest tube in place with air leak Abdomen - soft, non tender, decreased bowel sounds Extremities - 1+ edema Neuro - comatose  Assessment/plan:  Iatrogenic pneumothorax on the left after placement of subclavian CVL. - keep chest tube in place  Goals of care. - DNR/DNI, comfort measures - palliative care consulted and being assessed for hospice care  PCCM will f/u intermittently to assist with chest tube management.  Call if help needed in between.  Chesley Mires, MD [facility] Pulmonary/Critical Care 07-19-2019, 9:49 AM

## 2019-07-22 NOTE — Progress Notes (Signed)
Lower dentures removed and placed in pink case labeled with personal belongings.

## 2019-07-22 NOTE — Discharge Summary (Addendum)
Death Summary  Kelsey Wade [id number] DOB: 09/05/1961 DOA: 07/08/2019  PCP: Elenore Paddy, FNP PCP/Office notified: yes  Admit date: 07/08/19 Date of Death: 2019/07/18  Final Diagnoses:  Active Problems:   HTN (hypertension)   Hyperlipidemia LDL goal <100   Chronic pain   ETOH abuse   Acute encephalopathy   AKI (acute kidney injury) ([place])   Toxic metabolic encephalopathy   Hypernatremia   Acute respiratory failure with hypoxemia (H[facility])   Status epilepticus (HCC)   Shock circulatory (HCC)   Pressure injury of skin   Comfort measures only status   Palliative care by specialist   Pneumothorax on left    Acute Hypoxic Respiratory Failure, secondary to acute encephalopathy / suspected postictal state.  Initially intubated, but then extubated and transitioned to comfort care.  Iatrogenic Pneumothorax: PCCM following for chest tube management.  Air leak present.  Seizure Disorder: known history of noncompliance with antiepileptic medications.  IV Keppra initially, but d/c'd given comfort care status.  Acute Kidney Injury: likely ATN given prolonged hypovolemia   History of present illness:  Patient placed on comfort care after events outlined below.  Is unresponsive on my evaluation.  Hospital Course:  58 year old with history of CVA, chronic back pain, depression, alcohol, GERD, HTN, seizure disorder, PRES, who presented with fall,and was unable to get up for approximately 3 days. Patient also had poor oral intake. It seems that she was too heavy her husband to pick up.  Noted to have an episode of seizure like activity in the ED on admission and was unresponsive, hypernatremia, respiratory failure, iatrogenic pneumothorax, hypotension,AKI,left hand ischemia. Supposedly patient was in cardiac arrest in the ED with 1 minute for ROSC. Patient was initially admitted to hospital service however given her acute encephalopathy and need for airway protection, she  was intubated, and was seen by PCCM. Goals of care conversation was had with the husband, it was decided to patient come comfort measures. Of note patient does still have chest tube for the pneumothorax. Palliative care consulted with family and decision for hospice was made.  Accepted at [facility], but expired 07-18-23 at 1922.   Time: 20-25 min  Signed:  Ezekiel Slocumb, DO [facility] 07/18/19, 7:09 PM

## 2019-07-22 NOTE — Progress Notes (Signed)
PROGRESS NOTE    Kelsey Wade  [id number] DOB: March 23, 1961 DOA: 07/06/2019 PCP: Elenore Paddy, FNP    Brief Narrative:  58 year old with history of CVA, chronic back pain, depression, alcohol, GERD, HTN, seizure disorder, PRES, who presented with fall, and was unable to get up for approximately 3 days.  Patient also had poor oral intake.  It seems that she was too heavy her husband to pick up.   Noted to have an episode of seizure like activity in the ED on admission and was unresponsive, hypernatremia, respiratory failure, iatrogenic pneumothorax, hypotension, AKI, left hand ischemia.  Supposedly patient was in cardiac arrest in the ED with 1 minute for ROSC.  Patient was initially admitted to hospital service however given her acute encephalopathy and need for airway protection, she was intubated, and was seen by PCCM.  Goals of care conversation was had with the husband, it was decided to patient come comfort measures.  Of note patient does still have chest tube for the pneumothorax.  Palliative care consulted with family and decision for hospice was made.  Accepted at [facility], awaiting bed.   Assessment & Plan: As above, patient was admitted for acute encephalopathy and for possible seizure activity.  She was intubated and then extubated as she was transition to comfort care.  Palliative care consulted and appreciated.  Patient is DNR.    Active Problems:   HTN (hypertension)   Hyperlipidemia LDL goal <100   Chronic pain   ETOH abuse   Acute encephalopathy   AKI (acute kidney injury) ([facility])   Toxic metabolic encephalopathy   Hypernatremia   Acute respiratory failure with hypoxemia (HCC)   Status epilepticus (HCC)   Shock circulatory (HCC)   Pressure injury of skin   Comfort measures only status   Palliative care by specialist  COMFORT MEASURES ONLY  Acute Hypoxic Respiratory Failure, secondary to acute encephalopathy / suspected postictal state.  Initially  intubated, but then extubated and transitioned to comfort care.  Iatrogenic Pneumothorax: PCCM following for chest tube management.  Air leak present.  Seizure Disorder: known history of noncompliance with antiepileptic medications.  IV Keppra initially, but d/c'd given comfort care status.  Acute Kidney Injury: likely ATN given prolonged hypovolemia    DVT prophylaxis: none (comfort care) Code Status: DNR Family Communication: none at bedside Disposition Plan: [facility] when bed available  Consultants:   PCCM  Neurology  Palliative Care   Procedures:  Intubation/extubation  EEG  Chest tube placement  Antimicrobials:   None    Subjective: Patient seen and examined.  Resting comfortably in bed.  Appears in no acute distress.  No acute events reported overnight.  Objective: Vitals:   06/25/19 1800 06/25/19 1900 06/26/19 0617 07-14-19 1101  BP:   127/85 (!) 88/56  Pulse: (!) 46 (!) 35 (!) 55 (!) 53  Resp: (!) 32 (!) 31 (!) 21   Temp:      TempSrc:      SpO2: 97% (!) 89% 94% (!) 82%  Weight:      Height:        Intake/Output Summary (Last 24 hours) at 07/14/2019 1759 Last data filed at 2019-07-14 1510 Gross per 24 hour  Intake 0 ml  Output 550 ml  Net -550 ml   Filed Weights   07/19/2019 1631  Weight: 65.1 kg    Examination:  General exam: Appears calm and comfortable  Respiratory system: Clear to auscultation. Respiratory effort normal. Cardiovascular system: S1 &  S2 heard, RRR. No JVD, murmurs, rubs, gallops or clicks. No pedal edema. Gastrointestinal system: Abdomen is nondistended, soft and nontender. No organomegaly or masses felt. Normal bowel sounds heard. Central nervous system: Alert and oriented. No focal neurological deficits. Extremities: Symmetric 5 x 5 power. Skin: No rashes, lesions or ulcers Psychiatry: Judgement and insight appear normal. Mood & affect appropriate.     Data Reviewed: I have personally reviewed  following labs and imaging studies  CBC: Recent Labs  Lab 06/23/2019 1640 06/23/19 0212 06/23/19 0518 06/23/19 1241 06/24/19 0410 06/24/19 0532  WBC 18.2* 17.3* 17.6*  --  18.9*  --   NEUTROABS 14.6*  --   --   --   --   --   HGB 19.9* 19.4* 20.1* 16.3* 14.3 13.3  HCT 61.4* 60.4* 65.8* 48.0* 47.0* 39.0  MCV 88.6 88.8 91.3  --  92.0  --   PLT 294 327 294  --  208  --    Basic Metabolic Panel: Recent Labs  Lab 07/09/2019 2223 06/23/19 0212 06/23/19 0518 06/23/19 1140 06/23/19 1241 06/23/19 1712 06/23/19 2222 06/24/19 0410 06/24/19 0532  NA 157* 156* 158* 159* 159* 156* 158* 159* 157*  K 3.9 4.3 5.1 3.8 4.1 3.7 3.4* 3.5 3.4*  CL 122* 120* 120* 125*  --  127* >130* >130*  --   CO2 21* 20* 17* 19*  --  19* 18* 19*  --   GLUCOSE 211* 220* 179* 267*  --  320* 250* 213*  --   BUN 123* 125* 137* 134*  --  120* 118* 116*  --   CREATININE 2.17* 2.55* 2.60* 2.63*  --  2.24* 2.11* 2.29*  --   CALCIUM 9.6 9.6 9.5 8.4*  --  7.4* 7.3* 8.1*  --   MG  --  3.0* 3.0*  --   --   --   --  2.4  --   PHOS 4.9* 5.3* 5.1*  --   --   --   --  4.8*  --    GFR: Estimated Creatinine Clearance: 23.7 mL/min (A) (by C-G formula based on SCr of 2.29 mg/dL (H)). Liver Function Tests: Recent Labs  Lab 07/20/2019 1640 06/23/19 0212 06/23/19 0518  AST 22 20 23   ALT 27 25 25   ALKPHOS 81 82 94  BILITOT 1.1 1.0 0.7  PROT 7.7 7.8 7.3  ALBUMIN 3.7 3.7 3.4*   No results for input(s): LIPASE, AMYLASE in the last 168 hours. No results for input(s): AMMONIA in the last 168 hours. Coagulation Profile: No results for input(s): INR, PROTIME in the last 168 hours. Cardiac Enzymes: Recent Labs  Lab 06/30/2019 1640 06/23/19 0212  CKTOTAL 192 131   BNP (last 3 results) No results for input(s): PROBNP in the last 8760 hours. HbA1C: No results for input(s): HGBA1C in the last 72 hours. CBG: Recent Labs  Lab 06/23/19 1541 06/23/19 1956 06/23/19 2350 06/24/19 0411 06/24/19 0758  GLUCAP 293* 296* 96 206*  218*   Lipid Profile: No results for input(s): CHOL, HDL, LDLCALC, TRIG, CHOLHDL, LDLDIRECT in the last 72 hours. Thyroid Function Tests: No results for input(s): TSH, T4TOTAL, FREET4, T3FREE, THYROIDAB in the last 72 hours. Anemia Panel: No results for input(s): VITAMINB12, FOLATE, FERRITIN, TIBC, IRON, RETICCTPCT in the last 72 hours. Sepsis Labs: Recent Labs  Lab 07/10/2019 1610 06/28/2019 2223 06/23/19 0212 06/23/19 1140 06/24/19 0410  PROCALCITON  --   --   --  0.16 0.20  LATICACIDVEN 2.6* 1.7 1.9  --   --  Recent Results (from the past 240 hour(s))  SARS Coronavirus 2 [employer] order, Performed in [facility] hospital lab) Nasopharyngeal Nasopharyngeal Swab     Status: None   Collection Time: 07/17/2019  5:15 PM   Specimen: Nasopharyngeal Swab  Result Value Ref Range Status   SARS Coronavirus 2 NEGATIVE NEGATIVE Final    Comment: (NOTE) If result is NEGATIVE SARS-CoV-2 target nucleic acids are NOT DETECTED. The SARS-CoV-2 RNA is generally detectable in upper and lower  respiratory specimens during the acute phase of infection. The lowest  concentration of SARS-CoV-2 viral copies this assay can detect is 250  copies / mL. A negative result does not preclude SARS-CoV-2 infection  and should not be used as the sole basis for treatment or other  patient management decisions.  A negative result may occur with  improper specimen collection / handling, submission of specimen other  than nasopharyngeal swab, presence of viral mutation(s) within the  areas targeted by this assay, and inadequate number of viral copies  (<250 copies / mL). A negative result must be combined with clinical  observations, patient history, and epidemiological information. If result is POSITIVE SARS-CoV-2 target nucleic acids are DETECTED. The SARS-CoV-2 RNA is generally detectable in upper and lower  respiratory specimens dur ing the acute phase of infection.  Positive  results are indicative of  active infection with SARS-CoV-2.  Clinical  correlation with patient history and other diagnostic information is  necessary to determine patient infection status.  Positive results do  not rule out bacterial infection or co-infection with other viruses. If result is PRESUMPTIVE POSTIVE SARS-CoV-2 nucleic acids MAY BE PRESENT.   A presumptive positive result was obtained on the submitted specimen  and confirmed on repeat testing.  While 2019 novel coronavirus  (SARS-CoV-2) nucleic acids may be present in the submitted sample  additional confirmatory testing may be necessary for epidemiological  and / or clinical management purposes  to differentiate between  SARS-CoV-2 and other Sarbecovirus currently known to infect humans.  If clinically indicated additional testing with an alternate test  methodology [phone]) is advised. The SARS-CoV-2 RNA is generally  detectable in upper and lower respiratory sp ecimens during the acute  phase of infection. The expected result is Negative. Fact Sheet for Patients:  StrictlyIdeas.no Fact Sheet for Healthcare Providers: BankingDealers.co.za This test is not yet approved or cleared by the [place] FDA and has been authorized for detection and/or diagnosis of SARS-CoV-2 by FDA under an Emergency Use Authorization (EUA).  This EUA will remain in effect (meaning this test can be used) for the duration of the COVID-19 declaration under Section 564(b)(1) of the Act, 21 U.S.C. section 360bbb-3(b)(1), unless the authorization is terminated or revoked sooner. Performed at [facility], [place] [address], [place], [place] [address]   Culture, blood (Routine X 2) w Reflex to ID Panel     Status: None (Preliminary result)   Collection Time: 06/23/19 12:59 PM   Specimen: BLOOD LEFT HAND  Result Value Ref Range Status   Specimen Description BLOOD LEFT HAND  Final   Special Requests   Final    BOTTLES  DRAWN AEROBIC ONLY Blood Culture results may not be optimal due to an inadequate volume of blood received in culture bottles   Culture   Final    NO GROWTH 4 DAYS Performed at [facility], [place] [address] [place], [place] [address]    Report Status PENDING  Incomplete  Culture, blood (Routine X 2)  w Reflex to ID Panel     Status: None (Preliminary result)   Collection Time: 06/23/19  1:18 PM   Specimen: BLOOD LEFT HAND  Result Value Ref Range Status   Specimen Description BLOOD LEFT HAND  Final   Special Requests   Final    BOTTLES DRAWN AEROBIC ONLY Blood Culture results may not be optimal due to an inadequate volume of blood received in culture bottles   Culture   Final    NO GROWTH 4 DAYS Performed at [facility], [place] [address], [place], [place] [address]    Report Status PENDING  Incomplete  Culture, Urine     Status: None   Collection Time: 06/23/19  9:00 PM   Specimen: Urine, Catheterized  Result Value Ref Range Status   Specimen Description URINE, CATHETERIZED  Final   Special Requests NONE  Final   Culture   Final    NO GROWTH Performed at [facility], [address], [place], [place] [address]    Report Status 06/25/2019 FINAL  Final         Radiology Studies: No results found.      Scheduled Meds: . phenytoin (DILANTIN) IV  100 mg Intravenous Q8H   Continuous Infusions: . levETIRAcetam 500 mg (06-30-2019 0953)     LOS: 5 days    Time spent: 30 min    Ezekiel Slocumb, MD [facility] Pager [phone]  If 7PM-7AM, please contact night-coverage [email or web address] Password [email or web address] 06/30/2019, 5:59 PM

## 2019-07-22 DEATH — deceased

## 2020-06-05 IMAGING — CT CT ANGIOGRAPHY NECK
1 of 11 series · 5 of 46 positions shown, 10 images · IV contrast (OMNI)
Comparison: 07/14/2018 CT angiogram head and neck.

CLINICAL DATA: 57 y/o  F; altered mental status. Abnormal CT.

EXAM:
CT ANGIOGRAPHY HEAD AND NECK
TECHNIQUE: Multidetector CT imaging of the head and neck was performed using
the standard protocol during bolus administration of intravenous
contrast. Multiplanar CT image reconstructions and MIPs were
obtained to evaluate the vascular anatomy. Carotid stenosis
measurements (when applicable) are obtained utilizing NASCET
criteria, using the distal internal carotid diameter as the
denominator.
CONTRAST:  100mL OMNIPAQUE IOHEXOL 350 MG/ML SOLN

[Series 5: carotid/brain 2.0 i30f 3 · axial · 0.46mm/px · z∈[-276,-28]mm · 5 of 188 slices shown, 10 images]
[im 32/188  soft-tissue]
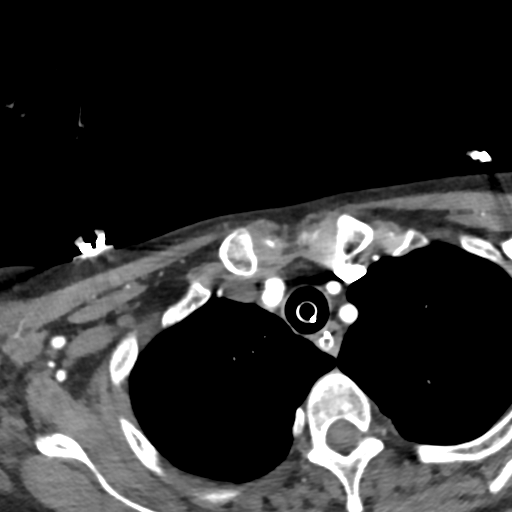
[im 32/188  bone]
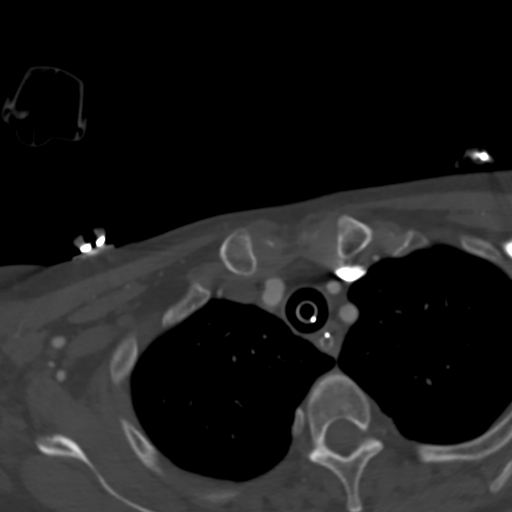
[im 63/188  soft-tissue]
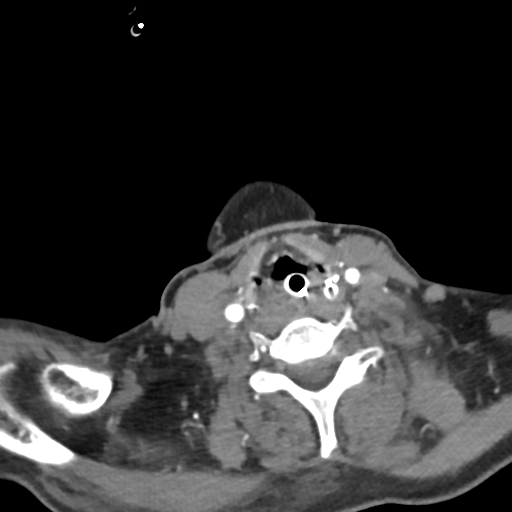
[im 63/188  lung]
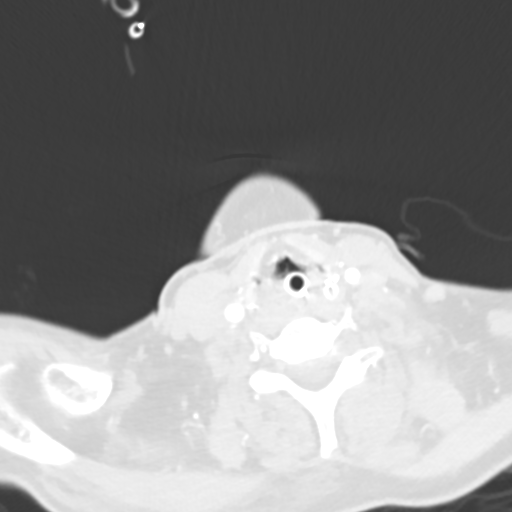
[im 94/188  soft-tissue]
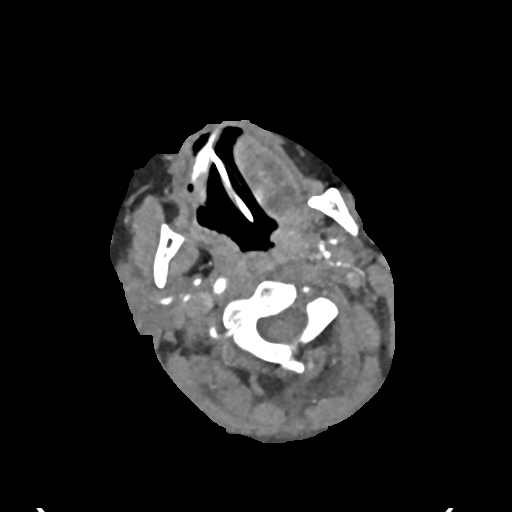
[im 94/188  lung]
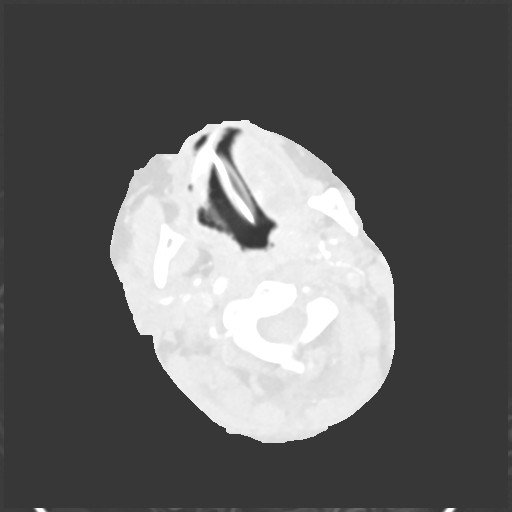
[im 125/188  soft-tissue]
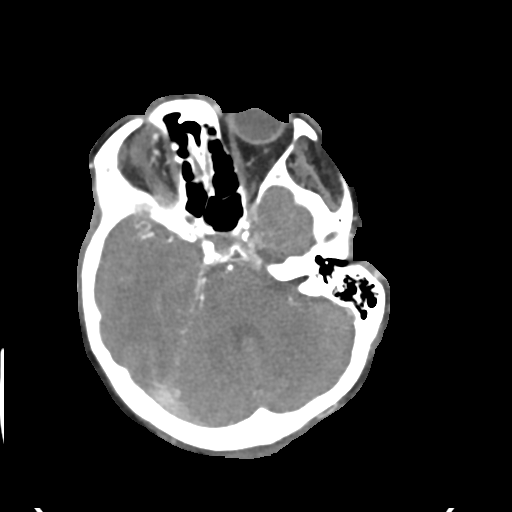
[im 125/188  lung]
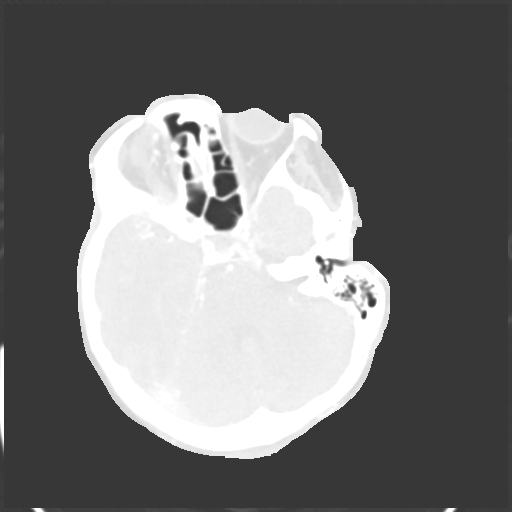
[im 156/188  soft-tissue]
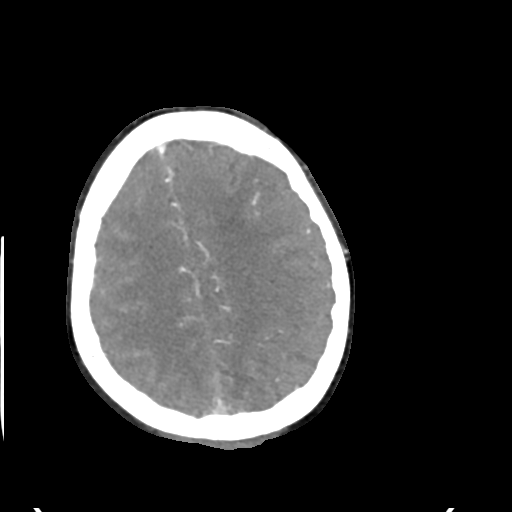
[im 156/188  lung]
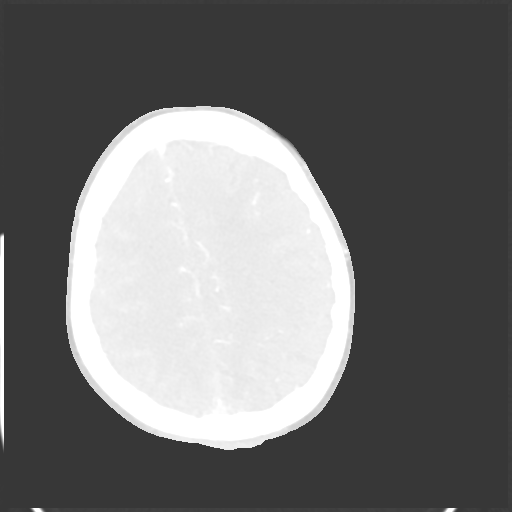

[5 of 46 positions shown; findings below may reference images not displayed]

FINDINGS: CTA NECK FINDINGS

Aortic arch: Standard branching. Imaged portion shows no evidence of
aneurysm or dissection. No significant stenosis of the major arch
vessel origins. Aortic atherosclerosis.

Right carotid system: Mixed plaque of the carotid bifurcation with
proximal ICA moderate 50% stenosis. There are several small plaque
ulcerations within the fibrofatty component of the plaque.

Left carotid system: 50% stenosis of the left common carotid artery
origin with fibrofatty plaque. Chronic left ICA occlusion.

Vertebral arteries: Chronic proximal left vertebral artery occlusion
with intermittent reconstitution. Severe right vertebral artery
origin stenosis.

Skeleton: No acute osseous abnormality. Stable C7, T2, T5 prominent
superior endplate Schmorl's nodes.

Other neck: There is edema within the soft tissues of the neck and
scalp posteriorly. No fluid collection.

Upper chest: Negative.

Review of the MIP images confirms the above findings

CTA HEAD FINDINGS

Anterior circulation: Stable occlusion of left internal carotid
artery petrous and cavernous segments, patent paraclinoid and
terminal segments. Stable moderate left ACA stenosis. No new large
vessel occlusion, aneurysm or vascular malformation.

Posterior circulation: Stable mild bilateral proximal PCA stenosis.
No large vessel occlusion, aneurysm, or vascular malformation.
Bilateral V4 segments are patent.

Venous sinuses: As permitted by contrast timing, patent.

Anatomic variants: None significant.

Delayed phase: No abnormal intracranial enhancement.

Review of the MIP images confirms the above findings
IMPRESSION: CTA neck:

1. Stable chronic left ICA occlusion.
2. Stable right proximal ICA moderate 50% stenosis with fibrofatty
plaque.
3. Stable left common carotid artery origin 50% stenosis with
fibrofatty plaque.
4. Stable chronic proximal left vertebral artery occlusion with
intermittent reconstitution in the neck.
5. Stable severe right vertebral artery origin stenosis.
6. Edema within the posterior soft tissues of neck and scalp,
probably related to being on floor for long duration.

CTA head:

1. No new intracranial large vessel occlusion.
2. Stable chronic occlusion of left internal carotid artery petrous
and cavernous segments. Patent left paraclinoid and terminal ICA
segments.
3. Stable moderate left ACA stenosis versus right dominant system.
4. Stable mild bilateral proximal PCA stenosis.

## 2020-06-05 IMAGING — CT CT CERVICAL SPINE WITHOUT CONTRAST
5 of 8 series · 13 of 33 positions shown, 14 images · non-contrast
Comparison: 10/10/2018, 07/14/2018

CLINICAL DATA: Unresponsive and recent fall, initial encounter

EXAM:
CT HEAD WITHOUT CONTRAST
CT CERVICAL SPINE WITHOUT CONTRAST
TECHNIQUE: Multidetector CT imaging of the head and cervical spine was
performed following the standard protocol without intravenous
contrast. Multiplanar CT image reconstructions of the cervical spine
were also generated.

[Series 5: head bone · axial · 0.45mm/px · z∈[+1293,+1349]mm · 2 of 85 slices shown]
[im 29/85  bone]
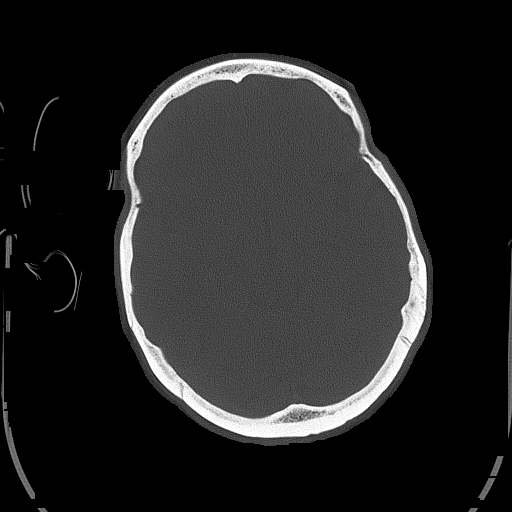
[im 57/85  bone]
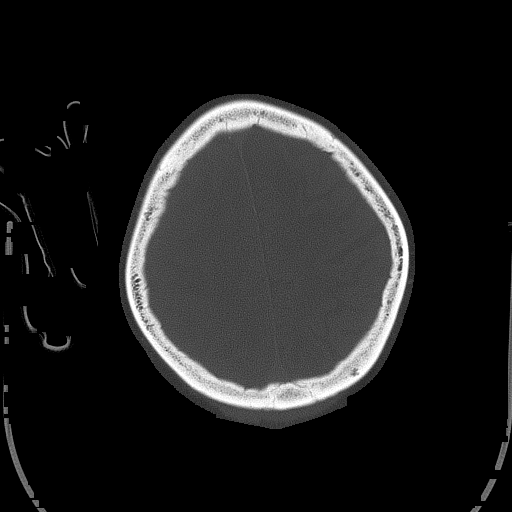

[Series 8: c_spine 2.0 st · axial · 0.35mm/px · z∈[+1146,+1250]mm · 3 of 104 slices shown, 4 images]
[im 26/104  soft-tissue]
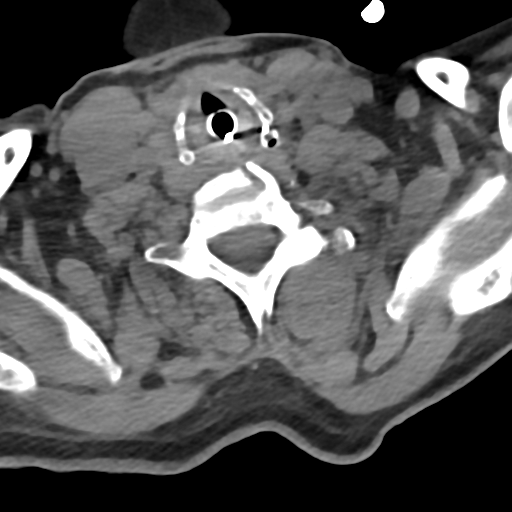
[im 26/104  bone]
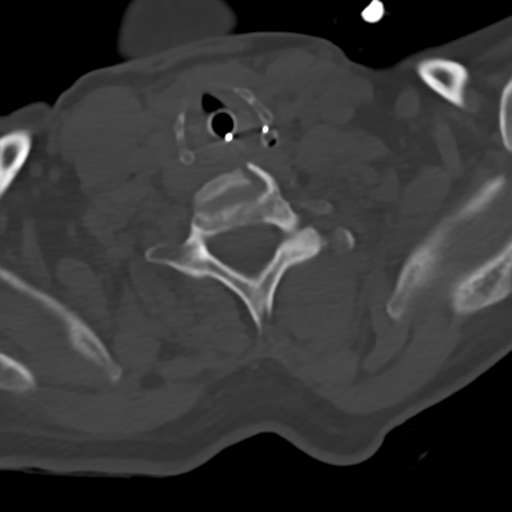
[im 52/104  bone]
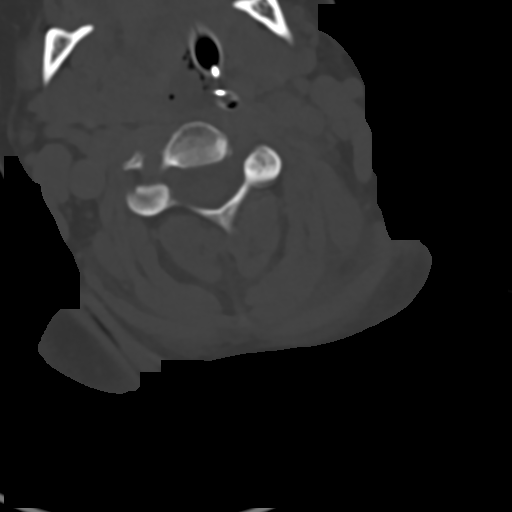
[im 78/104  bone]
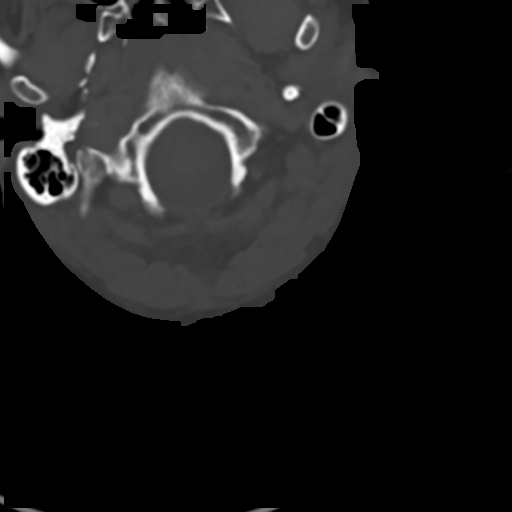

[Series 12: c_spine 2.0 sag bone · sagittal · 0.25mm/px · 5 of 61 slices shown]
[im 11/61  bone]
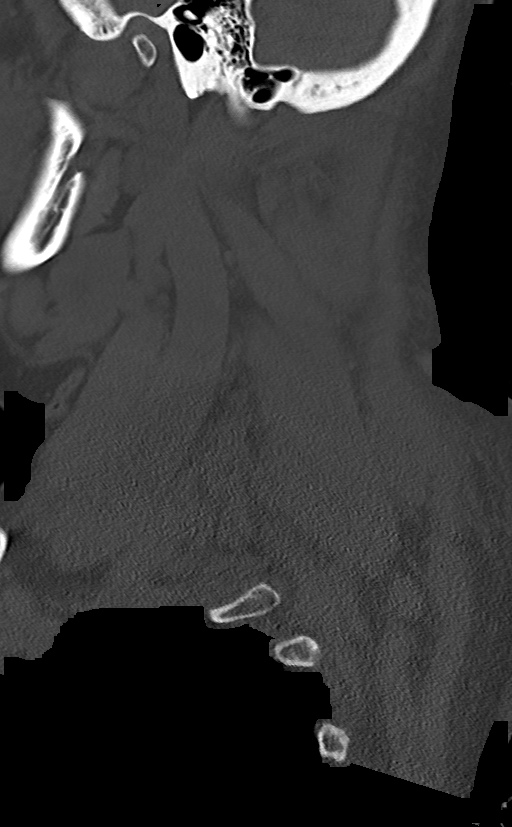
[im 21/61  bone]
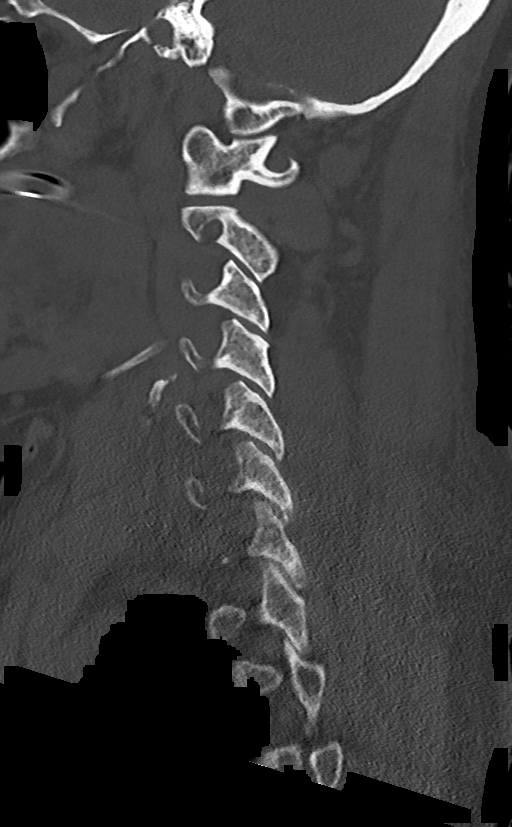
[im 31/61  bone]
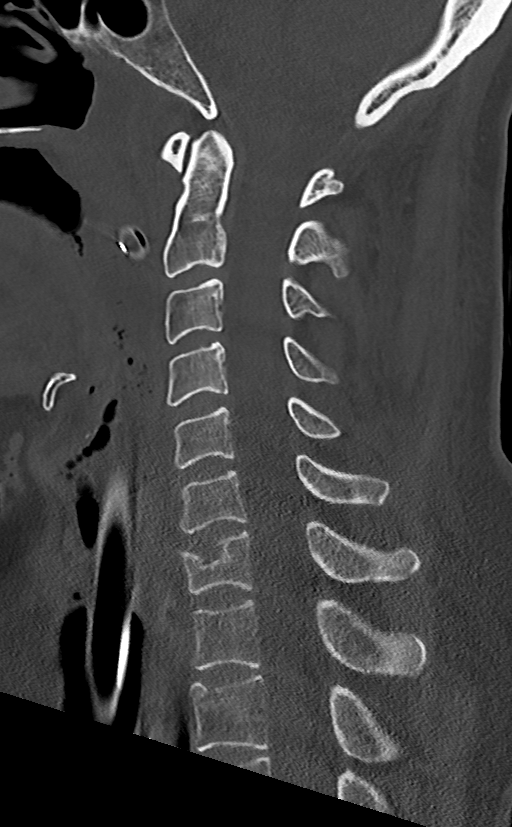
[im 41/61  bone]
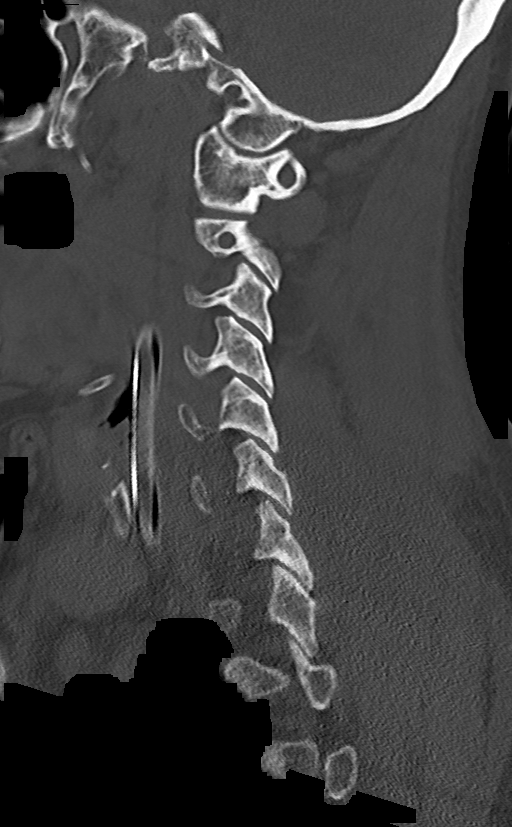
[im 51/61  bone]
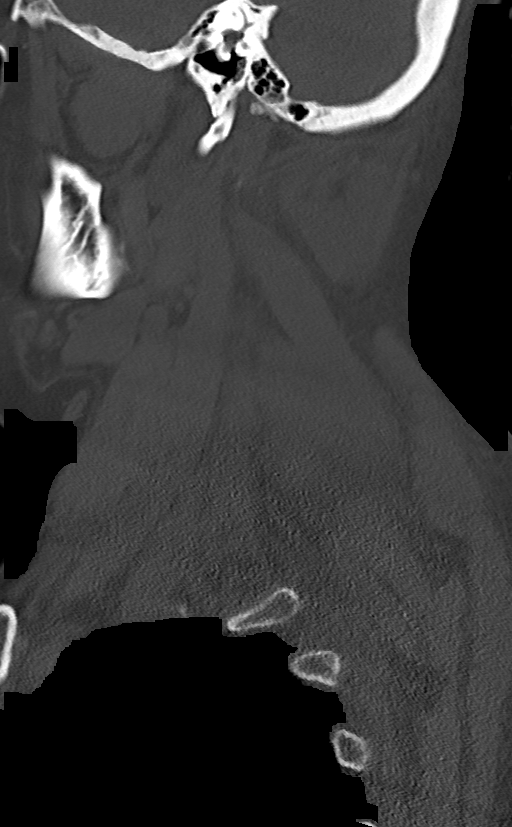

[Series 13: c_spine 2.0 cor bone · coronal · 0.23mm/px · 1 of 61 slices shown]
[im 31/61  bone]
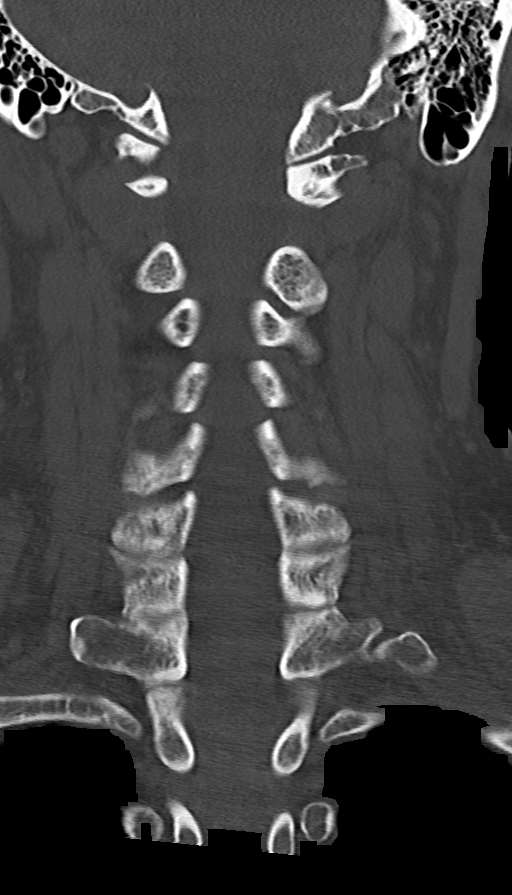

[Series 14: c_spine 2.0 orthogonals · axial · 0.21mm/px · z∈[+1132,+1183]mm · 2 of 91 slices shown]
[im 31/91  bone]
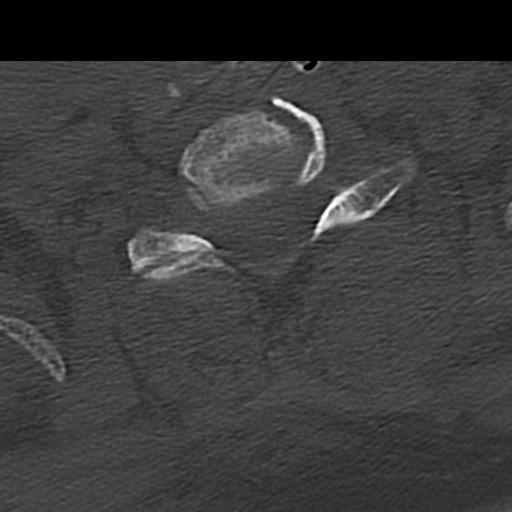
[im 61/91  bone]
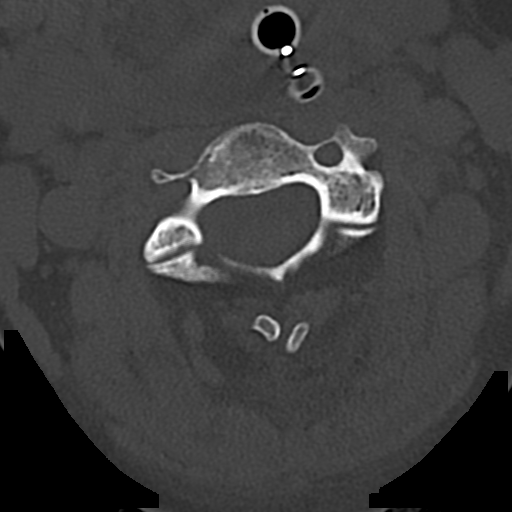

[13 of 33 positions shown; findings below may reference images not displayed]

FINDINGS: CT HEAD FINDINGS

Brain: Diffuse edema is noted within the right cerebral hemisphere
with sulcal blunting and mild mass effect from right to left of
approximately 3 mm. Hyperdense MCA is noted on the right consistent
with thrombosis chronic ischemic changes are again seen in the left
cerebellar hemisphere. Scattered cerebellar infarcts are noted as
well. No acute hemorrhage is seen.

Vascular: Hyperdense right MCA is noted.

Skull: Skull is intact.

Sinuses/Orbits: Paranasal sinuses are within normal limits. The
orbits and their contents are unremarkable.

Other: Scalp hematoma is noted posteriorly near the vertex.

CT CERVICAL SPINE FINDINGS

Alignment: Mild straightening of the normal cervical lordosis is
noted.

Skull base and vertebrae: 7 cervical segments are well visualized.
Stable superior endplate deformity is noted at C7 and T2 similar to
that noted on a prior exam from 07/14/2018. No acute fracture or
acute facet abnormality is noted. The odontoid is within normal
limits. Mild facet hypertrophic changes are seen.

Soft tissues and spinal canal: Surrounding soft tissues demonstrates
some subcutaneous edema along the musculature of the posterior neck
on the right this may be related to the recent injury. No focal
hematoma is noted. Endotracheal tube and gastric catheter are noted.
No other soft tissue abnormality is noted.

Upper chest: Mild emphysematous changes are seen.

Other: None
IMPRESSION: CT of the head: Diffuse edema within the right cerebral hemisphere
particularly in the distribution of the right middle cerebral artery
consistent with acute ischemia. This is superimposed over chronic
ischemic changes and mild midline shift from right to left is noted
of approximately 3 mm. No definitive mass lesion is noted. Changes
of mild hyperdense right MCA are noted.

Chronic atrophic and ischemic changes stable from the previous exam.

Posterior scalp hematoma near the vertex.

CT of the cervical spine: Degenerative and chronic changes stable
from the previous exam.

No acute bony abnormality noted.

Soft tissue changes in the musculature of the posterior neck on the
right likely related to recent fall.

Critical Value/emergent results were called by telephone at the time
of interpretation on 01/02/2019 at [DATE] to NAJAH FASANO, PA ,
who verbally acknowledged these results.
